# Patient Record
Sex: Female | Born: 1935
Health system: Southern US, Community
[De-identification: ages and names within clinical notes are randomized; demographics above are authoritative.]

## PROBLEM LIST (undated history)

## (undated) DIAGNOSIS — F4321 Adjustment disorder with depressed mood: Secondary | ICD-10-CM

## (undated) DIAGNOSIS — E041 Nontoxic single thyroid nodule: Secondary | ICD-10-CM

## (undated) DIAGNOSIS — J302 Other seasonal allergic rhinitis: Secondary | ICD-10-CM

## (undated) DIAGNOSIS — L299 Pruritus, unspecified: Secondary | ICD-10-CM

## (undated) DIAGNOSIS — M25551 Pain in right hip: Secondary | ICD-10-CM

## (undated) DIAGNOSIS — M1990 Unspecified osteoarthritis, unspecified site: Secondary | ICD-10-CM

## (undated) DIAGNOSIS — K219 Gastro-esophageal reflux disease without esophagitis: Secondary | ICD-10-CM

## (undated) DIAGNOSIS — E785 Hyperlipidemia, unspecified: Secondary | ICD-10-CM

## (undated) DIAGNOSIS — M1120 Other chondrocalcinosis, unspecified site: Secondary | ICD-10-CM

## (undated) DIAGNOSIS — F329 Major depressive disorder, single episode, unspecified: Secondary | ICD-10-CM

## (undated) DIAGNOSIS — I10 Essential (primary) hypertension: Secondary | ICD-10-CM

## (undated) DIAGNOSIS — F32A Depression, unspecified: Secondary | ICD-10-CM

## (undated) DIAGNOSIS — R7302 Impaired glucose tolerance (oral): Secondary | ICD-10-CM

## (undated) DIAGNOSIS — T7840XA Allergy, unspecified, initial encounter: Secondary | ICD-10-CM

## (undated) HISTORY — DX: Gastro-esophageal reflux disease without esophagitis: K21.9

## (undated) HISTORY — PX: ABDOMINAL HYSTERECTOMY: SHX81

## (undated) HISTORY — PX: KNEE SURGERY: SHX244

## (undated) HISTORY — PX: COLONOSCOPY: SHX174

## (undated) HISTORY — DX: Unspecified osteoarthritis, unspecified site: M19.90

## (undated) HISTORY — DX: Major depressive disorder, single episode, unspecified: F32.9

## (undated) HISTORY — DX: Other chondrocalcinosis, unspecified site: M11.20

## (undated) HISTORY — DX: Essential (primary) hypertension: I10

## (undated) HISTORY — DX: Impaired glucose tolerance (oral): R73.02

## (undated) HISTORY — DX: Allergy, unspecified, initial encounter: T78.40XA

## (undated) HISTORY — DX: Morbid (severe) obesity due to excess calories: E66.01

## (undated) HISTORY — DX: Other seasonal allergic rhinitis: J30.2

## (undated) HISTORY — DX: Nontoxic single thyroid nodule: E04.1

## (undated) HISTORY — DX: Hyperlipidemia, unspecified: E78.5

## (undated) HISTORY — DX: Depression, unspecified: F32.A

## (undated) HISTORY — DX: Pruritus, unspecified: L29.9

## (undated) HISTORY — PX: TUMOR REMOVAL: SHX12

---
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
## 1999-12-25 ENCOUNTER — Encounter: Payer: Self-pay | Admitting: Emergency Medicine

## 1999-12-25 ENCOUNTER — Emergency Department (HOSPITAL_COMMUNITY): Admission: EM | Admit: 1999-12-25 | Discharge: 1999-12-25 | Payer: Self-pay | Admitting: Emergency Medicine

## 2001-04-01 ENCOUNTER — Emergency Department (HOSPITAL_COMMUNITY): Admission: EM | Admit: 2001-04-01 | Discharge: 2001-04-01 | Payer: Self-pay | Admitting: *Deleted

## 2002-04-19 ENCOUNTER — Encounter: Admission: RE | Admit: 2002-04-19 | Discharge: 2002-04-19 | Payer: Self-pay | Admitting: Internal Medicine

## 2002-04-30 ENCOUNTER — Encounter: Admission: RE | Admit: 2002-04-30 | Discharge: 2002-07-29 | Payer: Self-pay

## 2002-09-10 ENCOUNTER — Encounter: Admission: RE | Admit: 2002-09-10 | Discharge: 2002-09-10 | Payer: Self-pay | Admitting: Internal Medicine

## 2002-09-19 ENCOUNTER — Encounter: Admission: RE | Admit: 2002-09-19 | Discharge: 2002-09-19 | Payer: Self-pay | Admitting: Internal Medicine

## 2003-03-04 ENCOUNTER — Encounter: Admission: RE | Admit: 2003-03-04 | Discharge: 2003-03-04 | Payer: Self-pay | Admitting: Internal Medicine

## 2003-04-09 ENCOUNTER — Encounter: Admission: RE | Admit: 2003-04-09 | Discharge: 2003-04-09 | Payer: Self-pay | Admitting: Internal Medicine

## 2003-05-29 ENCOUNTER — Encounter: Admission: RE | Admit: 2003-05-29 | Discharge: 2003-05-29 | Payer: Self-pay | Admitting: Internal Medicine

## 2003-05-31 ENCOUNTER — Encounter: Admission: RE | Admit: 2003-05-31 | Discharge: 2003-05-31 | Payer: Self-pay | Admitting: Internal Medicine

## 2003-06-13 ENCOUNTER — Emergency Department (HOSPITAL_COMMUNITY): Admission: EM | Admit: 2003-06-13 | Discharge: 2003-06-14 | Payer: Self-pay | Admitting: *Deleted

## 2003-06-13 ENCOUNTER — Encounter: Payer: Self-pay | Admitting: Emergency Medicine

## 2003-06-14 ENCOUNTER — Encounter: Admission: RE | Admit: 2003-06-14 | Discharge: 2003-06-14 | Payer: Self-pay | Admitting: Internal Medicine

## 2003-06-21 ENCOUNTER — Ambulatory Visit (HOSPITAL_COMMUNITY): Admission: RE | Admit: 2003-06-21 | Discharge: 2003-06-21 | Payer: Self-pay | Admitting: Internal Medicine

## 2003-06-21 ENCOUNTER — Encounter: Payer: Self-pay | Admitting: Internal Medicine

## 2003-06-26 ENCOUNTER — Encounter: Admission: RE | Admit: 2003-06-26 | Discharge: 2003-06-26 | Payer: Self-pay | Admitting: Internal Medicine

## 2003-07-02 ENCOUNTER — Encounter (HOSPITAL_COMMUNITY): Admission: RE | Admit: 2003-07-02 | Discharge: 2003-09-30 | Payer: Self-pay | Admitting: Internal Medicine

## 2003-07-03 ENCOUNTER — Encounter: Payer: Self-pay | Admitting: Internal Medicine

## 2003-07-05 ENCOUNTER — Encounter: Admission: RE | Admit: 2003-07-05 | Discharge: 2003-07-05 | Payer: Self-pay | Admitting: Internal Medicine

## 2003-07-15 ENCOUNTER — Ambulatory Visit (HOSPITAL_COMMUNITY): Admission: RE | Admit: 2003-07-15 | Discharge: 2003-07-15 | Payer: Self-pay | Admitting: Internal Medicine

## 2003-07-15 ENCOUNTER — Encounter (INDEPENDENT_AMBULATORY_CARE_PROVIDER_SITE_OTHER): Payer: Self-pay | Admitting: *Deleted

## 2003-07-15 ENCOUNTER — Encounter: Payer: Self-pay | Admitting: Internal Medicine

## 2003-08-21 ENCOUNTER — Observation Stay (HOSPITAL_COMMUNITY): Admission: RE | Admit: 2003-08-21 | Discharge: 2003-08-22 | Payer: Self-pay | Admitting: Otolaryngology

## 2003-08-21 ENCOUNTER — Encounter (INDEPENDENT_AMBULATORY_CARE_PROVIDER_SITE_OTHER): Payer: Self-pay | Admitting: Specialist

## 2003-11-05 ENCOUNTER — Encounter: Admission: RE | Admit: 2003-11-05 | Discharge: 2003-11-05 | Payer: Self-pay | Admitting: Internal Medicine

## 2003-11-19 ENCOUNTER — Encounter: Admission: RE | Admit: 2003-11-19 | Discharge: 2003-11-19 | Payer: Self-pay | Admitting: Internal Medicine

## 2003-12-19 ENCOUNTER — Encounter: Admission: RE | Admit: 2003-12-19 | Discharge: 2003-12-19 | Payer: Self-pay | Admitting: Internal Medicine

## 2004-03-13 ENCOUNTER — Encounter: Admission: RE | Admit: 2004-03-13 | Discharge: 2004-03-13 | Payer: Self-pay | Admitting: Internal Medicine

## 2004-03-19 ENCOUNTER — Encounter: Admission: RE | Admit: 2004-03-19 | Discharge: 2004-03-19 | Payer: Self-pay | Admitting: Internal Medicine

## 2004-03-19 ENCOUNTER — Encounter (INDEPENDENT_AMBULATORY_CARE_PROVIDER_SITE_OTHER): Payer: Self-pay | Admitting: *Deleted

## 2004-03-20 ENCOUNTER — Encounter: Admission: RE | Admit: 2004-03-20 | Discharge: 2004-03-20 | Payer: Self-pay | Admitting: Internal Medicine

## 2004-05-04 ENCOUNTER — Encounter: Admission: RE | Admit: 2004-05-04 | Discharge: 2004-05-04 | Payer: Self-pay | Admitting: Internal Medicine

## 2004-07-01 ENCOUNTER — Encounter (INDEPENDENT_AMBULATORY_CARE_PROVIDER_SITE_OTHER): Payer: Self-pay | Admitting: *Deleted

## 2004-07-01 ENCOUNTER — Ambulatory Visit: Payer: Self-pay | Admitting: Internal Medicine

## 2004-07-09 ENCOUNTER — Ambulatory Visit: Payer: Self-pay | Admitting: Internal Medicine

## 2004-09-26 ENCOUNTER — Emergency Department (HOSPITAL_COMMUNITY): Admission: EM | Admit: 2004-09-26 | Discharge: 2004-09-26 | Payer: Self-pay | Admitting: Emergency Medicine

## 2004-11-02 ENCOUNTER — Ambulatory Visit: Payer: Self-pay | Admitting: Internal Medicine

## 2004-11-24 ENCOUNTER — Ambulatory Visit: Payer: Self-pay | Admitting: Obstetrics and Gynecology

## 2004-11-24 ENCOUNTER — Ambulatory Visit (HOSPITAL_COMMUNITY): Admission: RE | Admit: 2004-11-24 | Discharge: 2004-11-24 | Payer: Self-pay | Admitting: Internal Medicine

## 2005-05-01 ENCOUNTER — Emergency Department (HOSPITAL_COMMUNITY): Admission: EM | Admit: 2005-05-01 | Discharge: 2005-05-01 | Payer: Self-pay | Admitting: Emergency Medicine

## 2005-05-05 ENCOUNTER — Emergency Department (HOSPITAL_COMMUNITY): Admission: EM | Admit: 2005-05-05 | Discharge: 2005-05-05 | Payer: Self-pay | Admitting: Emergency Medicine

## 2005-06-01 ENCOUNTER — Ambulatory Visit: Payer: Self-pay | Admitting: Internal Medicine

## 2005-06-08 ENCOUNTER — Ambulatory Visit: Payer: Self-pay | Admitting: Internal Medicine

## 2005-08-04 ENCOUNTER — Emergency Department (HOSPITAL_COMMUNITY): Admission: EM | Admit: 2005-08-04 | Discharge: 2005-08-04 | Payer: Self-pay | Admitting: Emergency Medicine

## 2005-12-23 ENCOUNTER — Encounter (INDEPENDENT_AMBULATORY_CARE_PROVIDER_SITE_OTHER): Payer: Self-pay | Admitting: *Deleted

## 2005-12-23 ENCOUNTER — Ambulatory Visit: Payer: Self-pay | Admitting: Internal Medicine

## 2005-12-23 LAB — CONVERTED CEMR LAB
Cholesterol: 158 mg/dL
HDL: 44 mg/dL
LDL Cholesterol: 98 mg/dL
Triglycerides: 82 mg/dL

## 2005-12-27 ENCOUNTER — Ambulatory Visit: Payer: Self-pay | Admitting: Internal Medicine

## 2006-02-03 ENCOUNTER — Emergency Department (HOSPITAL_COMMUNITY): Admission: EM | Admit: 2006-02-03 | Discharge: 2006-02-04 | Payer: Self-pay | Admitting: Emergency Medicine

## 2006-02-04 ENCOUNTER — Encounter (INDEPENDENT_AMBULATORY_CARE_PROVIDER_SITE_OTHER): Payer: Self-pay | Admitting: *Deleted

## 2006-02-04 ENCOUNTER — Ambulatory Visit (HOSPITAL_COMMUNITY): Admission: RE | Admit: 2006-02-04 | Discharge: 2006-02-04 | Payer: Self-pay | Admitting: Internal Medicine

## 2006-03-09 ENCOUNTER — Ambulatory Visit: Payer: Self-pay | Admitting: Internal Medicine

## 2006-03-17 ENCOUNTER — Ambulatory Visit: Payer: Self-pay | Admitting: Internal Medicine

## 2006-06-07 ENCOUNTER — Emergency Department (HOSPITAL_COMMUNITY): Admission: EM | Admit: 2006-06-07 | Discharge: 2006-06-07 | Payer: Self-pay | Admitting: Emergency Medicine

## 2006-07-29 ENCOUNTER — Ambulatory Visit: Payer: Self-pay | Admitting: Internal Medicine

## 2006-08-10 ENCOUNTER — Encounter (INDEPENDENT_AMBULATORY_CARE_PROVIDER_SITE_OTHER): Payer: Self-pay | Admitting: *Deleted

## 2006-08-10 DIAGNOSIS — E785 Hyperlipidemia, unspecified: Secondary | ICD-10-CM | POA: Insufficient documentation

## 2006-08-10 DIAGNOSIS — J309 Allergic rhinitis, unspecified: Secondary | ICD-10-CM | POA: Insufficient documentation

## 2006-08-10 DIAGNOSIS — IMO0002 Reserved for concepts with insufficient information to code with codable children: Secondary | ICD-10-CM | POA: Insufficient documentation

## 2006-08-10 DIAGNOSIS — E669 Obesity, unspecified: Secondary | ICD-10-CM | POA: Insufficient documentation

## 2006-08-10 DIAGNOSIS — L299 Pruritus, unspecified: Secondary | ICD-10-CM | POA: Insufficient documentation

## 2006-08-10 DIAGNOSIS — I10 Essential (primary) hypertension: Secondary | ICD-10-CM | POA: Insufficient documentation

## 2006-08-10 DIAGNOSIS — E041 Nontoxic single thyroid nodule: Secondary | ICD-10-CM

## 2006-08-10 HISTORY — DX: Nontoxic single thyroid nodule: E04.1

## 2006-08-10 HISTORY — DX: Allergic rhinitis, unspecified: J30.9

## 2006-11-01 DIAGNOSIS — F329 Major depressive disorder, single episode, unspecified: Secondary | ICD-10-CM

## 2006-11-01 DIAGNOSIS — F32A Depression, unspecified: Secondary | ICD-10-CM | POA: Insufficient documentation

## 2007-01-03 ENCOUNTER — Ambulatory Visit (HOSPITAL_COMMUNITY): Admission: RE | Admit: 2007-01-03 | Discharge: 2007-01-03 | Payer: Self-pay | Admitting: Internal Medicine

## 2007-01-03 ENCOUNTER — Encounter (INDEPENDENT_AMBULATORY_CARE_PROVIDER_SITE_OTHER): Payer: Self-pay | Admitting: *Deleted

## 2007-01-03 ENCOUNTER — Ambulatory Visit: Payer: Self-pay | Admitting: Hospitalist

## 2007-01-03 DIAGNOSIS — M810 Age-related osteoporosis without current pathological fracture: Secondary | ICD-10-CM | POA: Insufficient documentation

## 2007-01-03 DIAGNOSIS — E739 Lactose intolerance, unspecified: Secondary | ICD-10-CM | POA: Insufficient documentation

## 2007-01-03 DIAGNOSIS — R002 Palpitations: Secondary | ICD-10-CM | POA: Insufficient documentation

## 2007-01-03 LAB — CONVERTED CEMR LAB
BUN: 12 mg/dL (ref 6–23)
CO2: 26 meq/L (ref 19–32)
Calcium: 9.1 mg/dL (ref 8.4–10.5)
Chloride: 98 meq/L (ref 96–112)
Creatinine, Ser: 0.7 mg/dL (ref 0.40–1.20)
Glucose, Bld: 90 mg/dL (ref 70–99)
HCT: 38.3 % (ref 36.0–46.0)
Hemoglobin: 12.1 g/dL (ref 12.0–15.0)
MCHC: 31.6 g/dL (ref 30.0–36.0)
MCV: 81.8 fL (ref 78.0–100.0)
Platelets: 224 10*3/uL (ref 150–400)
Potassium: 4 meq/L (ref 3.5–5.3)
RBC: 4.68 M/uL (ref 3.87–5.11)
RDW: 15.5 % — ABNORMAL HIGH (ref 11.5–14.0)
Sodium: 139 meq/L (ref 135–145)
TSH: 0.941 microintl units/mL (ref 0.350–5.50)
WBC: 6.7 10*3/uL (ref 4.0–10.5)

## 2007-01-06 ENCOUNTER — Ambulatory Visit (HOSPITAL_COMMUNITY): Admission: RE | Admit: 2007-01-06 | Discharge: 2007-01-06 | Payer: Self-pay | Admitting: *Deleted

## 2007-01-06 ENCOUNTER — Encounter (INDEPENDENT_AMBULATORY_CARE_PROVIDER_SITE_OTHER): Payer: Self-pay | Admitting: *Deleted

## 2007-01-06 ENCOUNTER — Ambulatory Visit: Payer: Self-pay | Admitting: Internal Medicine

## 2007-01-09 LAB — CONVERTED CEMR LAB
Cholesterol: 186 mg/dL (ref 0–200)
Total CHOL/HDL Ratio: 4
VLDL: 17 mg/dL (ref 0–40)

## 2007-02-07 ENCOUNTER — Ambulatory Visit (HOSPITAL_COMMUNITY): Admission: RE | Admit: 2007-02-07 | Discharge: 2007-02-07 | Payer: Self-pay | Admitting: Internal Medicine

## 2007-04-17 ENCOUNTER — Telehealth (INDEPENDENT_AMBULATORY_CARE_PROVIDER_SITE_OTHER): Payer: Self-pay | Admitting: *Deleted

## 2007-05-07 ENCOUNTER — Emergency Department (HOSPITAL_COMMUNITY): Admission: EM | Admit: 2007-05-07 | Discharge: 2007-05-07 | Payer: Self-pay | Admitting: Emergency Medicine

## 2007-05-22 ENCOUNTER — Ambulatory Visit: Payer: Self-pay | Admitting: Infectious Disease

## 2007-05-22 DIAGNOSIS — D179 Benign lipomatous neoplasm, unspecified: Secondary | ICD-10-CM | POA: Insufficient documentation

## 2007-05-23 ENCOUNTER — Ambulatory Visit (HOSPITAL_COMMUNITY): Admission: RE | Admit: 2007-05-23 | Discharge: 2007-05-23 | Payer: Self-pay | Admitting: *Deleted

## 2007-05-24 ENCOUNTER — Encounter (INDEPENDENT_AMBULATORY_CARE_PROVIDER_SITE_OTHER): Payer: Self-pay | Admitting: *Deleted

## 2007-05-29 ENCOUNTER — Encounter: Payer: Self-pay | Admitting: Licensed Clinical Social Worker

## 2007-05-29 ENCOUNTER — Ambulatory Visit: Payer: Self-pay | Admitting: Internal Medicine

## 2007-05-29 DIAGNOSIS — M1711 Unilateral primary osteoarthritis, right knee: Secondary | ICD-10-CM | POA: Insufficient documentation

## 2007-05-29 DIAGNOSIS — M1990 Unspecified osteoarthritis, unspecified site: Secondary | ICD-10-CM | POA: Insufficient documentation

## 2007-06-02 ENCOUNTER — Encounter: Admission: RE | Admit: 2007-06-02 | Discharge: 2007-06-02 | Payer: Self-pay | Admitting: Orthopedic Surgery

## 2007-06-07 ENCOUNTER — Encounter: Admission: RE | Admit: 2007-06-07 | Discharge: 2007-06-21 | Payer: Self-pay | Admitting: Specialist

## 2007-06-07 ENCOUNTER — Encounter (INDEPENDENT_AMBULATORY_CARE_PROVIDER_SITE_OTHER): Payer: Self-pay | Admitting: *Deleted

## 2007-06-14 ENCOUNTER — Encounter (INDEPENDENT_AMBULATORY_CARE_PROVIDER_SITE_OTHER): Payer: Self-pay | Admitting: *Deleted

## 2007-06-15 ENCOUNTER — Telehealth (INDEPENDENT_AMBULATORY_CARE_PROVIDER_SITE_OTHER): Payer: Self-pay | Admitting: *Deleted

## 2007-06-26 ENCOUNTER — Emergency Department (HOSPITAL_COMMUNITY): Admission: EM | Admit: 2007-06-26 | Discharge: 2007-06-26 | Payer: Self-pay | Admitting: Emergency Medicine

## 2007-07-05 ENCOUNTER — Emergency Department (HOSPITAL_COMMUNITY): Admission: EM | Admit: 2007-07-05 | Discharge: 2007-07-05 | Payer: Self-pay | Admitting: Emergency Medicine

## 2007-07-10 ENCOUNTER — Encounter (INDEPENDENT_AMBULATORY_CARE_PROVIDER_SITE_OTHER): Payer: Self-pay | Admitting: *Deleted

## 2007-07-10 ENCOUNTER — Ambulatory Visit: Payer: Self-pay | Admitting: Internal Medicine

## 2007-07-10 LAB — CONVERTED CEMR LAB
Bilirubin Urine: NEGATIVE
Leukocytes, UA: NEGATIVE
Protein, ur: NEGATIVE mg/dL
Specific Gravity, Urine: 1.024 (ref 1.005–1.03)
Urobilinogen, UA: 0.2 (ref 0.0–1.0)

## 2007-07-19 ENCOUNTER — Telehealth: Payer: Self-pay | Admitting: *Deleted

## 2007-07-26 ENCOUNTER — Ambulatory Visit (HOSPITAL_BASED_OUTPATIENT_CLINIC_OR_DEPARTMENT_OTHER): Admission: RE | Admit: 2007-07-26 | Discharge: 2007-07-26 | Payer: Self-pay | Admitting: Orthopedic Surgery

## 2007-08-31 ENCOUNTER — Ambulatory Visit: Payer: Self-pay | Admitting: Internal Medicine

## 2008-01-01 ENCOUNTER — Telehealth (INDEPENDENT_AMBULATORY_CARE_PROVIDER_SITE_OTHER): Payer: Self-pay | Admitting: *Deleted

## 2008-02-15 ENCOUNTER — Ambulatory Visit (HOSPITAL_COMMUNITY): Admission: RE | Admit: 2008-02-15 | Discharge: 2008-02-15 | Payer: Self-pay | Admitting: *Deleted

## 2008-02-19 ENCOUNTER — Emergency Department (HOSPITAL_COMMUNITY): Admission: EM | Admit: 2008-02-19 | Discharge: 2008-02-19 | Payer: Self-pay | Admitting: Emergency Medicine

## 2008-02-23 ENCOUNTER — Ambulatory Visit: Payer: Self-pay | Admitting: Infectious Disease

## 2008-02-23 DIAGNOSIS — H9190 Unspecified hearing loss, unspecified ear: Secondary | ICD-10-CM | POA: Insufficient documentation

## 2008-03-27 ENCOUNTER — Emergency Department (HOSPITAL_COMMUNITY): Admission: EM | Admit: 2008-03-27 | Discharge: 2008-03-27 | Payer: Self-pay | Admitting: Emergency Medicine

## 2008-04-16 ENCOUNTER — Encounter (INDEPENDENT_AMBULATORY_CARE_PROVIDER_SITE_OTHER): Payer: Self-pay | Admitting: *Deleted

## 2008-04-16 ENCOUNTER — Ambulatory Visit (HOSPITAL_COMMUNITY): Admission: RE | Admit: 2008-04-16 | Discharge: 2008-04-16 | Payer: Self-pay | Admitting: *Deleted

## 2008-05-15 ENCOUNTER — Encounter (INDEPENDENT_AMBULATORY_CARE_PROVIDER_SITE_OTHER): Payer: Self-pay | Admitting: *Deleted

## 2008-05-15 ENCOUNTER — Ambulatory Visit: Payer: Self-pay | Admitting: Internal Medicine

## 2008-05-16 DIAGNOSIS — R7309 Other abnormal glucose: Secondary | ICD-10-CM | POA: Insufficient documentation

## 2008-05-16 LAB — CONVERTED CEMR LAB
Calcium: 9.1 mg/dL (ref 8.4–10.5)
Glucose, Bld: 113 mg/dL — ABNORMAL HIGH (ref 70–99)
Potassium: 3.5 meq/L (ref 3.5–5.3)
Sodium: 143 meq/L (ref 135–145)

## 2008-05-22 ENCOUNTER — Encounter (INDEPENDENT_AMBULATORY_CARE_PROVIDER_SITE_OTHER): Payer: Self-pay | Admitting: *Deleted

## 2008-05-30 ENCOUNTER — Encounter: Admission: RE | Admit: 2008-05-30 | Discharge: 2008-07-04 | Payer: Self-pay | Admitting: *Deleted

## 2008-06-07 ENCOUNTER — Encounter (INDEPENDENT_AMBULATORY_CARE_PROVIDER_SITE_OTHER): Payer: Self-pay | Admitting: *Deleted

## 2008-06-11 ENCOUNTER — Encounter (INDEPENDENT_AMBULATORY_CARE_PROVIDER_SITE_OTHER): Payer: Self-pay | Admitting: *Deleted

## 2008-06-12 ENCOUNTER — Ambulatory Visit: Payer: Self-pay | Admitting: Internal Medicine

## 2008-06-12 ENCOUNTER — Encounter (INDEPENDENT_AMBULATORY_CARE_PROVIDER_SITE_OTHER): Payer: Self-pay | Admitting: *Deleted

## 2008-06-12 LAB — CONVERTED CEMR LAB
Cholesterol: 168 mg/dL (ref 0–200)
HDL: 49 mg/dL (ref >39)
Total CHOL/HDL Ratio: 3.4
Triglycerides: 100 mg/dL (ref <150)
VLDL: 20 mg/dL (ref 0–40)

## 2008-07-11 ENCOUNTER — Telehealth: Payer: Self-pay | Admitting: Gastroenterology

## 2008-07-12 ENCOUNTER — Telehealth (INDEPENDENT_AMBULATORY_CARE_PROVIDER_SITE_OTHER): Payer: Self-pay | Admitting: *Deleted

## 2008-07-31 ENCOUNTER — Encounter (INDEPENDENT_AMBULATORY_CARE_PROVIDER_SITE_OTHER): Payer: Self-pay | Admitting: *Deleted

## 2008-12-12 ENCOUNTER — Ambulatory Visit (HOSPITAL_COMMUNITY): Admission: RE | Admit: 2008-12-12 | Discharge: 2008-12-12 | Payer: Self-pay | Admitting: *Deleted

## 2008-12-12 ENCOUNTER — Ambulatory Visit: Payer: Self-pay | Admitting: *Deleted

## 2008-12-13 ENCOUNTER — Telehealth: Payer: Self-pay | Admitting: *Deleted

## 2008-12-13 ENCOUNTER — Encounter (INDEPENDENT_AMBULATORY_CARE_PROVIDER_SITE_OTHER): Payer: Self-pay | Admitting: *Deleted

## 2008-12-16 ENCOUNTER — Telehealth: Payer: Self-pay | Admitting: *Deleted

## 2008-12-23 ENCOUNTER — Encounter (INDEPENDENT_AMBULATORY_CARE_PROVIDER_SITE_OTHER): Payer: Self-pay | Admitting: *Deleted

## 2009-01-06 ENCOUNTER — Telehealth (INDEPENDENT_AMBULATORY_CARE_PROVIDER_SITE_OTHER): Payer: Self-pay | Admitting: *Deleted

## 2009-01-15 ENCOUNTER — Telehealth (INDEPENDENT_AMBULATORY_CARE_PROVIDER_SITE_OTHER): Payer: Self-pay | Admitting: *Deleted

## 2009-02-28 ENCOUNTER — Telehealth (INDEPENDENT_AMBULATORY_CARE_PROVIDER_SITE_OTHER): Payer: Self-pay | Admitting: *Deleted

## 2009-04-22 ENCOUNTER — Emergency Department (HOSPITAL_COMMUNITY): Admission: EM | Admit: 2009-04-22 | Discharge: 2009-04-23 | Payer: Self-pay | Admitting: Emergency Medicine

## 2009-04-24 ENCOUNTER — Encounter (INDEPENDENT_AMBULATORY_CARE_PROVIDER_SITE_OTHER): Payer: Self-pay | Admitting: Internal Medicine

## 2009-04-24 ENCOUNTER — Ambulatory Visit: Payer: Self-pay | Admitting: Emergency Medicine

## 2009-04-24 DIAGNOSIS — K219 Gastro-esophageal reflux disease without esophagitis: Secondary | ICD-10-CM | POA: Insufficient documentation

## 2009-05-01 ENCOUNTER — Telehealth: Payer: Self-pay | Admitting: *Deleted

## 2009-05-09 LAB — CONVERTED CEMR LAB
Helicobacter Pylori Antibody-IgG: >8 — ABNORMAL HIGH
LDL Cholesterol: 102 mg/dL — ABNORMAL HIGH (ref 0–99)
VLDL: 19 mg/dL (ref 0–40)

## 2009-05-12 ENCOUNTER — Telehealth: Payer: Self-pay | Admitting: Internal Medicine

## 2009-06-05 ENCOUNTER — Emergency Department (HOSPITAL_COMMUNITY): Admission: EM | Admit: 2009-06-05 | Discharge: 2009-06-06 | Payer: Self-pay | Admitting: Emergency Medicine

## 2009-06-09 ENCOUNTER — Ambulatory Visit: Payer: Self-pay | Admitting: Internal Medicine

## 2009-06-09 DIAGNOSIS — IMO0002 Reserved for concepts with insufficient information to code with codable children: Secondary | ICD-10-CM | POA: Insufficient documentation

## 2009-06-09 DIAGNOSIS — R3915 Urgency of urination: Secondary | ICD-10-CM | POA: Insufficient documentation

## 2009-06-09 LAB — CONVERTED CEMR LAB: Sed Rate: 59 mm/hr — ABNORMAL HIGH (ref 0–22)

## 2009-06-24 ENCOUNTER — Ambulatory Visit: Payer: Self-pay | Admitting: Internal Medicine

## 2009-07-08 ENCOUNTER — Ambulatory Visit (HOSPITAL_COMMUNITY): Admission: RE | Admit: 2009-07-08 | Discharge: 2009-07-08 | Payer: Self-pay | Admitting: Internal Medicine

## 2009-07-09 LAB — HM MAMMOGRAPHY

## 2009-08-11 ENCOUNTER — Telehealth: Payer: Self-pay | Admitting: *Deleted

## 2009-10-08 ENCOUNTER — Emergency Department (HOSPITAL_COMMUNITY): Admission: EM | Admit: 2009-10-08 | Discharge: 2009-10-08 | Payer: Self-pay | Admitting: Emergency Medicine

## 2009-11-04 ENCOUNTER — Ambulatory Visit: Payer: Self-pay | Admitting: Internal Medicine

## 2009-11-04 ENCOUNTER — Encounter: Payer: Self-pay | Admitting: Internal Medicine

## 2009-11-04 DIAGNOSIS — M67919 Unspecified disorder of synovium and tendon, unspecified shoulder: Secondary | ICD-10-CM | POA: Insufficient documentation

## 2009-11-04 DIAGNOSIS — M719 Bursopathy, unspecified: Secondary | ICD-10-CM

## 2009-11-18 ENCOUNTER — Telehealth: Payer: Self-pay | Admitting: *Deleted

## 2009-11-19 ENCOUNTER — Ambulatory Visit: Payer: Self-pay | Admitting: Internal Medicine

## 2009-11-26 ENCOUNTER — Ambulatory Visit (HOSPITAL_COMMUNITY): Admission: RE | Admit: 2009-11-26 | Discharge: 2009-11-26 | Payer: Self-pay | Admitting: Internal Medicine

## 2009-12-03 ENCOUNTER — Ambulatory Visit: Payer: Self-pay | Admitting: Internal Medicine

## 2009-12-08 ENCOUNTER — Ambulatory Visit: Payer: Self-pay | Admitting: Internal Medicine

## 2009-12-08 LAB — CONVERTED CEMR LAB
HDL: 58 mg/dL (ref >39)
Hgb A1c MFr Bld: 5.8 %
Total CHOL/HDL Ratio: 3.2
VLDL: 18 mg/dL (ref 0–40)

## 2009-12-17 ENCOUNTER — Emergency Department (HOSPITAL_COMMUNITY): Admission: EM | Admit: 2009-12-17 | Discharge: 2009-12-17 | Payer: Self-pay | Admitting: Emergency Medicine

## 2010-03-26 ENCOUNTER — Emergency Department (HOSPITAL_COMMUNITY): Admission: EM | Admit: 2010-03-26 | Discharge: 2010-03-26 | Payer: Self-pay | Admitting: Emergency Medicine

## 2010-04-07 ENCOUNTER — Telehealth: Payer: Self-pay | Admitting: Internal Medicine

## 2010-04-07 ENCOUNTER — Encounter: Payer: Self-pay | Admitting: Internal Medicine

## 2010-04-08 ENCOUNTER — Ambulatory Visit: Payer: Self-pay | Admitting: Internal Medicine

## 2010-04-08 ENCOUNTER — Telehealth: Payer: Self-pay | Admitting: *Deleted

## 2010-04-08 LAB — CONVERTED CEMR LAB
AST: 19 units/L (ref 0–37)
Albumin: 3.9 g/dL (ref 3.5–5.2)
Alkaline Phosphatase: 92 units/L (ref 39–117)
Basophils Absolute: 0 10*3/uL (ref 0.0–0.1)
Basophils Relative: 0 % (ref 0–1)
Eosinophils Absolute: 0.1 10*3/uL (ref 0.0–0.7)
Glucose, Bld: 105 mg/dL — ABNORMAL HIGH (ref 70–99)
Hemoglobin, Urine: NEGATIVE
Hemoglobin: 13.2 g/dL (ref 12.0–15.0)
Ketones, ur: NEGATIVE mg/dL
MCHC: 33.2 g/dL (ref 30.0–36.0)
MCV: 85.1 fL (ref >=78.0)
Monocytes Absolute: 0.3 10*3/uL (ref 0.1–1.0)
Monocytes Relative: 5 % (ref 3–12)
Neutrophils Relative %: 65 % (ref 43–77)
Potassium: 4 meq/L (ref 3.5–5.3)
Protein, ur: NEGATIVE mg/dL
RBC: 4.68 M/uL (ref 3.87–5.11)
RDW: 14.3 % (ref 11.5–15.5)
Sodium: 139 meq/L (ref 135–145)
Total Bilirubin: 0.4 mg/dL (ref 0.3–1.2)
Total Protein: 8 g/dL (ref 6.0–8.3)
Urine Glucose: NEGATIVE mg/dL
pH: 6.5 (ref 5.0–8.0)

## 2010-05-04 ENCOUNTER — Ambulatory Visit: Payer: Self-pay | Admitting: Internal Medicine

## 2010-05-04 LAB — CONVERTED CEMR LAB
Bilirubin Urine: NEGATIVE
Blood in Urine, dipstick: NEGATIVE
Glucose, Urine, Semiquant: NEGATIVE
Ketones, urine, test strip: NEGATIVE
Nitrite: NEGATIVE
Protein, U semiquant: NEGATIVE
Specific Gravity, Urine: >=1.03
Urobilinogen, UA: 0.2
WBC Urine, dipstick: NEGATIVE
pH: 5

## 2010-05-18 ENCOUNTER — Ambulatory Visit: Payer: Self-pay | Admitting: Internal Medicine

## 2010-05-19 ENCOUNTER — Telehealth: Payer: Self-pay | Admitting: Internal Medicine

## 2010-06-01 ENCOUNTER — Ambulatory Visit: Payer: Self-pay | Admitting: Internal Medicine

## 2010-06-01 LAB — CONVERTED CEMR LAB
BUN: 18 mg/dL (ref 6–23)
CO2: 29 meq/L (ref 19–32)
Chloride: 100 meq/L (ref 96–112)
Glucose, Bld: 99 mg/dL (ref 70–99)
Potassium: 4.1 meq/L (ref 3.5–5.3)

## 2010-08-10 ENCOUNTER — Ambulatory Visit: Payer: Self-pay | Admitting: Internal Medicine

## 2010-08-10 LAB — CONVERTED CEMR LAB
Albumin: 4.4 g/dL (ref 3.5–5.2)
Alkaline Phosphatase: 95 units/L (ref 39–117)
BUN: 24 mg/dL — ABNORMAL HIGH (ref 6–23)
Creatinine, Ser: 1.34 mg/dL — ABNORMAL HIGH (ref 0.40–1.20)
Glucose, Bld: 111 mg/dL — ABNORMAL HIGH (ref 70–99)
Potassium: 3.9 meq/L (ref 3.5–5.3)

## 2010-09-15 ENCOUNTER — Ambulatory Visit (HOSPITAL_COMMUNITY)
Admission: RE | Admit: 2010-09-15 | Discharge: 2010-09-15 | Payer: Self-pay | Source: Home / Self Care | Admitting: Internal Medicine

## 2010-09-24 ENCOUNTER — Telehealth: Payer: Self-pay | Admitting: Internal Medicine

## 2010-09-25 ENCOUNTER — Telehealth: Payer: Self-pay | Admitting: Internal Medicine

## 2010-11-01 ENCOUNTER — Encounter: Payer: Self-pay | Admitting: Orthopedic Surgery

## 2010-11-10 NOTE — Assessment & Plan Note (Signed)
Summary: CHECKUP/SB.   Vital Signs:  Patient profile:   75 year old Christine [name] Height:      68 inches (172.72 cm) Weight:      282.8 pounds (128.55 kg) BMI:     43.16 Temp:     Christine.0 degrees F Pulse rate:   98 / minute BP sitting:   143 / 81  (right arm) Cuff size:   large  Vitals Entered By: Dorie Rank RN (November 04, 2009 3:15 PM) CC: follow up since fall on ice and snow about 2-3 weeks ago - needs med refills - cannot get generic Zyrtec on current med plan- needs refill for depression Is Patient Diabetic? No Pain Assessment Patient in pain? yes     Location: right shoulder and arm, right side Intensity: 8 Type: throbbing Onset of pain  2 - 3 weeks when fell in ice and snow - moving certain way "makes me cry out oh" Nutritional Status BMI of > 30 = obese  Does patient need assistance? Functional Status Self care Ambulation Normal   Primary Care Provider:  Darnelle Maffucci MD  CC:  follow up since fall on ice and snow about 2-3 weeks ago - needs med refills - cannot get generic Zyrtec on current med plan- needs refill for depression.  History of Present Illness: 75 yo f with pmh per EMR. here at [facility] c/o pain.   the pain started 2-3 weeks ago after fall on ice, denies head trauma, no LOC, no confusion after fall. pain is located at R shoulder, neck, is about 6/10 intesity, stabbing in quality, radiates to neck and down to arm,  it has been getting worse over the past month ,it is brought on by movement,  it usually lasts for  the time she takes pain meds, pain comes and goes, it is alleviated by flexiril and percocet(provided by [facility] ED), associated with nothing ,Denies SOB, Denies CP, Denies n,v,c,d, fever, chills, night sweats, last BM today.       Depression History:      The patient denies a depressed mood most of the day and a diminished interest in her usual daily activities.        Comments:  "some days I just sit in the corner and cry or want to tear my husband's head  off and other days I get along just fine - like mood swings".   Preventive Screening-Counseling & Management  Alcohol-Tobacco     Alcohol drinks/day: 0     Smoking Status: quit     Year Quit: 25 yrs +  Caffeine-Diet-Exercise     Does Patient Exercise: no     Type of exercise: ROM     Times/week:    3  Current Medications (verified): 1)  Amlodipine Besylate 5 Mg Tabs (Amlodipine Besylate) .... Take 1 Tablet By Mouth Once A Day 2)  Colace 100 Mg Caps (Docusate Sodium) .... Take 1 Capsule By Mouth Once A Day 3)  Lactaid  Tabs (Lactase Tabs) .... Take One Tab Before Taking Milk Products 4)  Celexa 40 Mg Tabs (Citalopram Hydrobromide) .... Take 1 Tablet By Mouth Once A Day 5)  Vicodin 5-500 Mg  Tabs (Hydrocodone-Acetaminophen) .... Take 1 Tablet By Mouth Up To Every 4 Hours As Needed For Pain 6)  Ra Loratadine 10 Mg Tabs (Loratadine) .... Take 1 Tablet By Mouth Once A Day 7)  Lipitor 40 Mg Tabs (Atorvastatin Calcium) .... Take 1 Tablet By Mouth At Bedtime For Your Cholesterol 8)  Omeprazole 40 Mg Cpdr (Omeprazole) .... Take 1 Capsule By Mouth Once A Day  Allergies (verified): 1)  ! Allegra-D 12 Hour  Review of Systems       as per hpi  Physical Exam  General:  alert, well-developed, and cooperative to examination.    Neck:  tender, reduced ROM, no thyromegaly, no JVD, and no carotid bruits.    Lungs:  normal respiratory effort, no accessory muscle use, normal breath sounds, no crackles, and no wheezes.  Heart:  normal rate, regular rhythm, no murmur, no gallop, and no rub.    Abdomen:  soft, non-tender, normal bowel sounds, no distention, no guarding, no rebound tenderness, no hepatomegaly, and no splenomegaly.    Msk:  R shouler pain, with limited ROM consistent with bursitis   Impression & Recommendations:  Problem # 1:  BURSITIS, RIGHT SHOULDER (ICD-726.10) limited ROM after fall, appears to be bursitis in nature.Patient refused steroid shot today to her shoulder, she would  like conservative approach. will make FU in one month. will also give script for VICODIN which she is on for chronic pain.  Problem # 2:  PRURITUS (ICD-698.9) on citrizine, would like an alternative due to cost, will change her to loratidine.   Problem # 3:  HYPERTENSION (ICD-401.9) [place], will continue to monitor, will continue to monitor, if remain elevated, will consider adjustment to her BP meds.  Her updated medication list for this problem includes:    Amlodipine Besylate 5 Mg Tabs (Amlodipine besylate) .[name]... Take 1 tablet by mouth once a day  Complete Medication List: 1)  Amlodipine Besylate 5 Mg Tabs (Amlodipine besylate) .... Take 1 tablet by mouth once a day 2)  Colace 100 Mg Caps (Docusate sodium) .... Take 1 capsule by mouth once a day 3)  Lactaid Tabs (Lactase tabs) .... Take one tab before taking milk products 4)  Celexa 40 Mg Tabs (Citalopram hydrobromide) .... Take 1 tablet by mouth once a day 5)  Vicodin 5-500 Mg Tabs (Hydrocodone-acetaminophen) .... Take 1 tablet by mouth up to every 4 hours as needed for pain 6)  Ra Loratadine 10 Mg Tabs (Loratadine) .... Take 1 tablet by mouth once a day 7)  Lipitor 40 Mg Tabs (Atorvastatin calcium) .... Take 1 tablet by mouth at bedtime for your cholesterol 8)  Omeprazole 40 Mg Cpdr (Omeprazole) .... Take 1 capsule by mouth once a day  Other Orders: Future Orders: T-Lipid Profile ([id number]) ... 12/02/2009 T-Hgb A1C (in-house) [phone]) ... 12/02/2009  Patient Instructions: 1)  fasting lipid profile and A1c on next visit. 2)  Please schedule a follow-up appointment in 1 month preferably with Dr Gilford Rile Prescriptions: VICODIN 5-500 MG  TABS (HYDROCODONE-ACETAMINOPHEN) take 1 tablet by mouth up to every 4 hours as needed for pain  #60 x 0   Entered and Authorized by:   Darnelle Maffucci MD   Signed by:   Darnelle Maffucci MD on 11/04/2009   Method used:   Print then Give to Patient   RxID:   [id number] RA LORATADINE 10 MG  TABS (LORATADINE) Take 1 tablet by mouth once a day  #30 x 6   Entered and Authorized by:   Darnelle Maffucci MD   Signed by:   Darnelle Maffucci MD on 11/04/2009   Method used:   Print then Give to Patient   RxID:   [id number]  Process Orders Check Orders Results:     [facility]: Check successful Tests Sent for requisitioning (November 04, 2009 5:07 PM):  12/02/2009: [facility] -- T-Lipid Profile [id number] (signed)    Prevention & Chronic Care Immunizations   Influenza vaccine: unable to take  (08/31/2007)   Influenza vaccine deferral: Refused  (06/24/2009)    Tetanus booster: Not documented   Td booster deferral: Refused  (06/24/2009)    Pneumococcal vaccine: Not documented   Pneumococcal vaccine deferral: Refused  (06/24/2009)    H. zoster vaccine: Not documented   H. zoster vaccine deferral: Refused  (06/24/2009)  Colorectal Screening   Hemoccult: negative  (03/19/2004)   Hemoccult action/deferral: Deferred  (06/24/2009)    Colonoscopy: Not documented   Colonoscopy action/deferral: Deferred  (06/24/2009)  Other Screening   Pap smear: Not documented   Pap smear action/deferral: Not indicated S/P hysterectomy  (04/24/2009)    Mammogram: ASSESSMENT: Negative - BI-RADS 1^MM DIGITAL SCREENING  (07/08/2009)   Mammogram action/deferral: Ordered  (06/24/2009)   Mammogram due: 02/15/2010    DXA bone density scan: Not documented   DXA bone density action/deferral: Deferred  (06/09/2009)   Smoking status: quit  (11/04/2009)  Lipids   Total Cholesterol: 174  (04/24/2009)   LDL: 102  (04/24/2009)   LDL Direct: Not documented   HDL: 53  (04/24/2009)   Triglycerides: Christine  (04/24/2009)    SGOT (AST): Not documented   SGPT (ALT): Not documented   Alkaline phosphatase: Not documented   Total bilirubin: Not documented  Hypertension   Last Blood Pressure: 143 / 81  (11/04/2009)   Serum creatinine: 0.72  (06/09/2009)   BMP action:  Ordered   Serum potassium 3.8  (06/09/2009)  Self-Management Support :   Personal Goals (by the next clinic visit) :      Personal blood pressure goal: 140/90  (06/09/2009)     Personal LDL goal: 100  (06/09/2009)    Patient will work on the following items until the next clinic visit to reach self-care goals:     Medications and monitoring: take my medicines every day, bring all of my medications to every visit  (11/04/2009)     Eating: drink diet soda or water instead of juice or soda  (11/04/2009)     Activity: take a 30 minute walk every day  (11/04/2009)     Other: has had increased chips and fried foods and added salt - states going to start "eating better" - states has not been able to exercise since sore from fall  (11/04/2009)    Hypertension self-management support: Written self-care plan  (06/09/2009)    Lipid self-management support: Not documented

## 2010-11-10 NOTE — Assessment & Plan Note (Signed)
Summary: 2WK F/U/TOBBIA/VS   Vital Signs:  Patient profile:   75 year old female Height:      68 inches (172.72 cm) Weight:      285.9 pounds (129.95 kg) BMI:     43.63 BSA:     2.38 Temp:     97.9 degrees F (36.61 degrees C) oral Pulse rate:   91 / minute BP sitting:   153 / 86  (right arm) Cuff size:   large  Vitals Entered By: Krystal Eaton Duncan Dull) (December 03, 2009 10:11 AM) CC: 2wk f/u-shoulder/arm pain  Is Patient Diabetic? No Pain Assessment Patient in pain? yes     Location: right shoulder/arm Intensity: 4 Type: sharp and ache Onset of pain  intermittent s/p fall in 09/2009 Nutritional Status BMI of > 30 = obese  Have you ever been in a relationship where you felt threatened, hurt or afraid?No   Does patient need assistance? Functional Status Self care Ambulation Normal   Primary Care Provider:  Darnelle Maffucci MD  CC:  2wk f/u-shoulder/arm pain .  History of Present Illness: 75 y/o woman with PMH of HTN, shoulder pain comes to the clinic for follow up visit. Her shoulder pain is controlled with vicodin and flexeril No new compliants today  Current Medications (verified): 1)  Amlodipine Besylate 10 Mg Tabs (Amlodipine Besylate) .... Take 1 Tab Daily 2)  Colace 100 Mg Caps (Docusate Sodium) .... Take 1 Capsule By Mouth Once A Day 3)  Lactaid  Tabs (Lactase Tabs) .... Take One Tab Before Taking Milk Products 4)  Celexa 40 Mg Tabs (Citalopram Hydrobromide) .... Take 1 Tablet By Mouth Once A Day 5)  Vicodin 5-500 Mg  Tabs (Hydrocodone-Acetaminophen) .... Take 1 Tablet By Mouth Up To Every 4 Hours As Needed For Pain 6)  Ra Loratadine 10 Mg Tabs (Loratadine) .... Take 1 Tablet By Mouth Once A Day 7)  Lipitor 40 Mg Tabs (Atorvastatin Calcium) .... Take 1 Tablet By Mouth At Bedtime For Your Cholesterol 8)  Omeprazole 40 Mg Cpdr (Omeprazole) .... Take 1 Capsule By Mouth Once A Day 9)  Hydrocodone-Acetaminophen 5-500 Mg Tabs (Hydrocodone-Acetaminophen) 10)   Cyclobenzaprine Hcl 10 Mg Tabs (Cyclobenzaprine Hcl) .... Three Times A Day  Allergies: 1)  ! Allegra-D 12 Hour  Review of Systems  The patient denies anorexia, fever, weight loss, weight gain, vision loss, decreased hearing, hoarseness, chest pain, syncope, dyspnea on exertion, peripheral edema, prolonged cough, headaches, hemoptysis, abdominal pain, melena, hematochezia, severe indigestion/heartburn, hematuria, incontinence, genital sores, muscle weakness, suspicious skin lesions, transient blindness, difficulty walking, depression, unusual weight change, abnormal bleeding, enlarged lymph nodes, angioedema, breast masses, and testicular masses.    Physical Exam  General:  alert, well-hydrated, and overweight-appearing.   Head:  normocephalic and atraumatic.   Eyes:  vision grossly intact, pupils equal, pupils round, pupils reactive to light, and pupils react to accomodation.   Ears:  R ear normal and L ear normal.   Nose:  no external deformity.   Mouth:   pharynx pink and moist, no erythema, and no exudates.    Neck:  supple, full ROM, and no JVD.   Lungs:  normal respiratory effort, no accessory muscle use, normal breath sounds, and no wheezes.   Heart:  normal rate, regular rhythm, no murmur, no gallop, and no rub.   Abdomen:  soft, non-tender, normal bowel sounds, no distention, and no masses.   Msk:  R shouler pain, better compared to last visit, with limited ROM Pulses:  dorsalis  pedis pulses normal bilaterally  Extremities:  no edema Neurologic:  OrientedX3, cranial nerver 2-12 intact,strength good in all extremities, sensations normal to light touch, reflexes 2+ b/l, gait normal    Impression & Recommendations:  Problem # 1:  BURSITIS, RIGHT SHOULDER (ICD-726.10) Continue with vocodin and flexeril. Shoulder xra shows degenrative changes, no acute bony abnormality  Problem # 2:  HYPERTENSION (ICD-401.9) Increase norvasc to 10mg  once daily for better control  Her updated  medication list for this problem includes:    Amlodipine Besylate 10 Mg Tabs (Amlodipine besylate) .[name]... Take 1 tab daily  Problem # 3:  HYPERLIPIDEMIA (ICD-272.4) Will need lipid profile. Will come back in a week for that. is not fasting today Her updated medication list for this problem includes:    Lipitor 40 Mg Tabs (Atorvastatin calcium) .[name]... Take 1 tablet by mouth at bedtime for your cholesterol  Future Orders: T-Lipid Profile ([id number]) ... 12/08/2009    HDL:53 (04/24/2009), 49 (06/12/2008)  LDL:102 (04/24/2009), 99 (14/78/2956)  Chol:174 (04/24/2009), 168 (06/12/2008)  Trig:97 (04/24/2009), 100 (06/12/2008)  Complete Medication List: 1)  Amlodipine Besylate 10 Mg Tabs (Amlodipine besylate) .... Take 1 tab daily 2)  Colace 100 Mg Caps (Docusate sodium) .... Take 1 capsule by mouth once a day 3)  Lactaid Tabs (Lactase tabs) .... Take one tab before taking milk products 4)  Celexa 40 Mg Tabs (Citalopram hydrobromide) .... Take 1 tablet by mouth once a day 5)  Vicodin 5-500 Mg Tabs (Hydrocodone-acetaminophen) .... Take 1 tablet by mouth up to every 4 hours as needed for pain 6)  Ra Loratadine 10 Mg Tabs (Loratadine) .... Take 1 tablet by mouth once a day 7)  Lipitor 40 Mg Tabs (Atorvastatin calcium) .... Take 1 tablet by mouth at bedtime for your cholesterol 8)  Omeprazole 40 Mg Cpdr (Omeprazole) .... Take 1 capsule by mouth once a day 9)  Hydrocodone-acetaminophen 5-500 Mg Tabs (Hydrocodone-acetaminophen) 10)  Cyclobenzaprine Hcl 10 Mg Tabs (Cyclobenzaprine hcl) .... Three times a day  Patient Instructions: 1)  Please schedule a follow-up appointment in 3 months. 2)  Come on Monday 12/08/2009 for cholesterol check. come fasting at that time 3)  It is important that you exercise regularly at least 20 minutes 5 times a week. If you develop chest pain, have severe difficulty breathing, or feel very tired , stop exercising immediately and seek medical attention. 4)  You need to lose  weight. Consider a lower calorie diet and regular exercise.  Prescriptions: AMLODIPINE BESYLATE 10 MG TABS (AMLODIPINE BESYLATE) Take 1 tab daily  #30 x 3   Entered and Authorized by:   Bethel Born MD   Signed by:   Bethel Born MD on 12/03/2009   Method used:   Electronically to        [facility] [id number] (retail)       [address]       [place], [place]  [address]       Ph: [phone]       Fax: [phone]   RxID:   [id number]  Process Orders Check Orders Results:     [facility]: Check successful Tests Sent for requisitioning (December 03, 2009 12:08 PM):     12/08/2009: [facility] -- T-Lipid Profile [id number] (signed)    Prevention & Chronic Care Immunizations   Influenza vaccine: unable to take  (08/31/2007)   Influenza vaccine deferral: Refused  (06/24/2009)    Tetanus  booster: Not documented   Td booster deferral: Refused  (06/24/2009)    Pneumococcal vaccine: Not documented   Pneumococcal vaccine deferral: Refused  (06/24/2009)    H. zoster vaccine: Not documented   H. zoster vaccine deferral: Refused  (06/24/2009)  Colorectal Screening   Hemoccult: negative  (03/19/2004)   Hemoccult action/deferral: Deferred  (06/24/2009)    Colonoscopy: Not documented   Colonoscopy action/deferral: Deferred  (06/24/2009)  Other Screening   Pap smear: Not documented   Pap smear action/deferral: Not indicated S/P hysterectomy  (04/24/2009)    Mammogram: ASSESSMENT: Negative - BI-RADS 1^MM DIGITAL SCREENING  (07/08/2009)   Mammogram action/deferral: Ordered  (06/24/2009)   Mammogram due: 02/15/2010    DXA bone density scan: Not documented   DXA bone density action/deferral: Deferred  (06/09/2009)   Smoking status: quit  (11/19/2009)  Lipids   Total Cholesterol: 174  (04/24/2009)   LDL: 102  (04/24/2009)   LDL Direct: Not documented   HDL: 53  (04/24/2009)   Triglycerides: 97   (04/24/2009)    SGOT (AST): Not documented   SGPT (ALT): Not documented   Alkaline phosphatase: Not documented   Total bilirubin: Not documented  Hypertension   Last Blood Pressure: 153 / 86  (12/03/2009)   Serum creatinine: 0.72  (06/09/2009)   BMP action: Ordered   Serum potassium 3.8  (06/09/2009)  Self-Management Support :   Personal Goals (by the next clinic visit) :      Personal blood pressure goal: 140/90  (06/09/2009)     Personal LDL goal: 100  (06/09/2009)    Patient will work on the following items until the next clinic visit to reach self-care goals:     Medications and monitoring: take my medicines every day  (12/03/2009)     Eating: drink diet soda or water instead of juice or soda, eat foods that are low in salt, eat baked foods instead of fried foods  (12/03/2009)     Activity: join a walking program  (11/19/2009)     Other: has had increased chips and fried foods and added salt - states going to start "eating better" - states has not been able to exercise since sore from fall  (11/04/2009)    Hypertension self-management support: Written self-care plan, Resources for patients handout  (12/03/2009)   Hypertension self-care plan printed.    Lipid self-management support: Written self-care plan, Resources for patients handout  (12/03/2009)   Lipid self-care plan printed.      Resource handout printed.

## 2010-11-10 NOTE — Assessment & Plan Note (Signed)
Summary: EST-2 WEEK RECHECK/CH   Vital Signs:  Patient profile:   75 year old Christine [name] Height:      68 inches (172.72 cm) Weight:      300.7 pounds (136.68 kg) BMI:     45.89 Temp:     97.0 degrees F (36.11 degrees C) oral Pulse rate:   92 / minute BP sitting:   137 / 84  (right arm)  Vitals Entered By: Stanton Kidney Ditzler RN (June 01, 2010 9:07 AM) CC: Depression Is Patient Diabetic? No Pain Assessment Patient in pain? yes     Location: right knee Intensity: 4 Type: throbbing Onset of pain  years Nutritional Status BMI of > 30 = obese Nutritional Status Detail appetite good  Have you ever been in a relationship where you felt threatened, hurt or afraid?denies   Does patient need assistance? Functional Status Self care Ambulation Normal Comments FU BP.   Primary Care Provider:  Darnelle Maffucci MD  CC:  Depression.  History of Present Illness: 75 y/o woman with PMH of HTN, shoulder pain comes to the clinic for a 2 week follow up visit for HTN.  Pt has been tolerating BP meds well, her BP today in under good control.  Pt reports improved urinary urgency after out discussion last week, and she is performing pelvic muscle strengthening exercises.   Patient is feeling well and denies CP, abdominal pain, nausea, vomiting, HA's, palpitations, blurred vision. fever, chills, diarrhea, constipation or SOB.   Depression History:      The patient denies a depressed mood most of the day and a diminished interest in her usual daily activities.         Preventive Screening-Counseling & Management  Alcohol-Tobacco     Alcohol drinks/day: 0     Smoking Status: quit     Year Quit: 25 yrs +  Caffeine-Diet-Exercise     Does Patient Exercise: no     Type of exercise: ROM     Times/week:    3  Current Medications (verified): 1)  Triamterene-Hctz 37.5-25 Mg Tabs (Triamterene-Hctz) .... Take 1 Tablet By Mouth Once A Day 2)  Colace 100 Mg Caps (Docusate Sodium) .... Take 1 Capsule By  Mouth Once A Day 3)  Lactaid  Tabs (Lactase Tabs) .... Take One Tab Before Taking Milk Products 4)  Celexa 40 Mg Tabs (Citalopram Hydrobromide) .... Take 1 Tablet By Mouth Once A Day 5)  Vicodin 5-500 Mg  Tabs (Hydrocodone-Acetaminophen) .... Take 1 Tablet By Mouth Up To Every 4 Hours As Needed For Pain 6)  Ra Loratadine 10 Mg Tabs (Loratadine) .... Take 1 Tablet By Mouth Once A Day 7)  Lipitor 40 Mg Tabs (Atorvastatin Calcium) .... Take 1 Tablet By Mouth At Bedtime For Your Cholesterol 8)  Omeprazole 40 Mg Cpdr (Omeprazole) .... Take 1 Capsule By Mouth Once A Day 9)  Hydrocodone-Acetaminophen 5-500 Mg Tabs (Hydrocodone-Acetaminophen) 10)  Cyclobenzaprine Hcl 10 Mg Tabs (Cyclobenzaprine Hcl) .... Three Times A Day 11)  Lisinopril 10 Mg Tabs (Lisinopril) .... Take 1 Tablet By Mouth Once A Day  Allergies: 1)  ! Allegra-D 12 Hour  Review of Systems       as per HPI  Physical Exam  General:  alert, well-hydrated, and overweight-appearing.   Mouth:   pharynx pink and moist, no erythema, and no exudates.    Neck:  supple, full ROM, and no JVD.   Lungs:  normal respiratory effort, no accessory muscle use, normal breath sounds, and no wheezes.  Heart:  normal rate, regular rhythm, no murmur, no gallop, and no rub.   Abdomen:  soft, non-tender, normal bowel sounds, no distention, and no masses.   Msk:  R shouler pain, better compared to last visit, with limited ROM Extremities:  no edema Neurologic:  OrientedX3, cranial nerver 2-12 intact,strength good in all extremities, sensations normal to light touch, reflexes 2+ b/l, gait normal  Skin:   turgor normal and no rashes.    Impression & Recommendations:  Problem # 1:  HYPERTENSION (ICD-401.9) Assessment Improved BP well controlled today, will to monitor and check a BMET given recent lisinopril initiation.   Her updated medication list for this problem includes:    Triamterene-hctz 37.5-25 Mg Tabs (Triamterene-hctz) .[name]... Take 1 tablet  by mouth once a day    Lisinopril 10 Mg Tabs (Lisinopril) .[name]... Take 1 tablet by mouth once a day  Orders: T-Basic Metabolic Panel [phone])  Problem # 2:  URINARY URGENCY (QMV-784.69) Assessment: Improved symptoms improved, will reassess on next followup and continue to Recommended pelvic floor strengthening exercises.  Problem # 3:  HYPERLIPIDEMIA (ICD-272.4) Assessment: Comment Only Well controlled on current treatment, No new changes made today, Will continue to monitor.   Her updated medication list for this problem includes:    Lipitor 40 Mg Tabs (Atorvastatin calcium) .[name]... Take 1 tablet by mouth at bedtime for your cholesterol  Labs Reviewed: SGOT: 19 (04/08/2010)   SGPT: 18 (04/08/2010)   HDL:58 (12/08/2009), 53 (04/24/2009)  LDL:110 (12/08/2009), 102 (04/24/2009)  Chol:186 (12/08/2009), 174 (04/24/2009)  Trig:91 (12/08/2009), 97 (04/24/2009)  Complete Medication List: 1)  Triamterene-hctz 37.5-25 Mg Tabs (Triamterene-hctz) .... Take 1 tablet by mouth once a day 2)  Colace 100 Mg Caps (Docusate sodium) .... Take 1 capsule by mouth once a day 3)  Lactaid Tabs (Lactase tabs) .... Take one tab before taking milk products 4)  Celexa 40 Mg Tabs (Citalopram hydrobromide) .... Take 1 tablet by mouth once a day 5)  Vicodin 5-500 Mg Tabs (Hydrocodone-acetaminophen) .... Take 1 tablet by mouth up to every 4 hours as needed for pain 6)  Ra Loratadine 10 Mg Tabs (Loratadine) .... Take 1 tablet by mouth once a day 7)  Lipitor 40 Mg Tabs (Atorvastatin calcium) .... Take 1 tablet by mouth at bedtime for your cholesterol 8)  Omeprazole 40 Mg Cpdr (Omeprazole) .... Take 1 capsule by mouth once a day 9)  Hydrocodone-acetaminophen 5-500 Mg Tabs (Hydrocodone-acetaminophen) 10)  Cyclobenzaprine Hcl 10 Mg Tabs (Cyclobenzaprine hcl) .... Three times a day 11)  Lisinopril 10 Mg Tabs (Lisinopril) .... Take 1 tablet by mouth once a day  Patient Instructions: 1)  Please schedule a follow-up  appointment in 3 months. Prescriptions: LISINOPRIL 10 MG TABS (LISINOPRIL) Take 1 tablet by mouth once a day  #30 x 3   Entered and Authorized by:   Darnelle Maffucci MD   Signed by:   Darnelle Maffucci MD on 06/01/2010   Method used:   Electronically to        [facility] [id number] (retail)       [address] [place], [place]  [address]       Ph: [phone]       Fax: [phone]   RxID:   [id number] TRIAMTERENE-HCTZ 37.5-25 MG TABS (TRIAMTERENE-HCTZ) Take 1 tablet by mouth once a day  #30 x 3   Entered and Authorized by:   Darnelle Maffucci  MD   Signed by:   Darnelle Maffucci MD on 06/01/2010   Method used:   Electronically to        [facility] [id number] (retail)       [address] [place], [place]  [address]       Ph: [phone]       Fax: [phone]   RxID:   [id number]  Process Orders Check Orders Results:     [facility]: Check successful Tests Sent for requisitioning (June 01, 2010 12:14 PM):     06/01/2010: [facility] -- T-Basic Metabolic Panel [id number] (signed)     Prevention & Chronic Care Immunizations   Influenza vaccine: unable to take  (08/31/2007)   Influenza vaccine deferral: Refused  (06/24/2009)    Tetanus booster: Not documented   Td booster deferral: Refused  (06/24/2009)    Pneumococcal vaccine: Not documented   Pneumococcal vaccine deferral: Refused  (06/24/2009)    H. zoster vaccine: Not documented   H. zoster vaccine deferral: Refused  (06/24/2009)  Colorectal Screening   Hemoccult: negative  (03/19/2004)   Hemoccult action/deferral: Deferred  (06/24/2009)    Colonoscopy: Not documented   Colonoscopy action/deferral: Deferred  (06/24/2009)  Other Screening   Pap smear: Not documented   Pap smear action/deferral: Not indicated S/P hysterectomy  (04/24/2009)    Mammogram: ASSESSMENT: Negative - BI-RADS 1^MM DIGITAL SCREENING   (07/08/2009)   Mammogram action/deferral: Ordered  (06/24/2009)   Mammogram due: 02/15/2010    DXA bone density scan: Not documented   DXA bone density action/deferral: Deferred  (06/09/2009)   Smoking status: quit  (06/01/2010)  Lipids   Total Cholesterol: 186  (12/08/2009)   LDL: 110  (12/08/2009)   LDL Direct: Not documented   HDL: 58  (12/08/2009)   Triglycerides: 91  (12/08/2009)    SGOT (AST): 19  (04/08/2010)   SGPT (ALT): 18  (04/08/2010)   Alkaline phosphatase: 92  (04/08/2010)   Total bilirubin: 0.4  (04/08/2010)  Hypertension   Last Blood Pressure: 137 / 84  (06/01/2010)   Serum creatinine: 0.65  (04/08/2010)   BMP action: Ordered   Serum potassium 4.0  (04/08/2010)  Self-Management Support :   Personal Goals (by the next clinic visit) :      Personal blood pressure goal: 140/90  (06/09/2009)     Personal LDL goal: 100  (06/09/2009)    Patient will work on the following items until the next clinic visit to reach self-care goals:     Medications and monitoring: take my medicines every day, bring all of my medications to every visit  (05/18/2010)     Eating: drink diet soda or water instead of juice or soda, eat more vegetables, use fresh or frozen vegetables, eat foods that are low in salt, eat fruit for snacks and desserts, limit or avoid alcohol  (05/18/2010)     Activity: take a 30 minute walk every day  (05/18/2010)     Other: no change since last viist  (06/01/2010)    Hypertension self-management support: Written self-care plan, Education handout, Resources for patients handout  (05/18/2010)    Lipid self-management support: Written self-care plan, Education handout, Resources for patients handout  (05/18/2010)

## 2010-11-10 NOTE — Progress Notes (Signed)
Summary: f/u ED, no better/ hla  Phone Note Call from Patient   Caller: Patient Summary of Call: pt calls c/o pain related to either gallbladder or kidneys...can't remember what she was told recently at [facility] ED desires to be seen or pain med called in, pt is instructed she needs to be seen...appts not available until end of next week...asked to go to urgent care at [facility] for eval and update, she is agreeable Initial call taken by: Marin Roberts RN,  April 07, 2010 12:33 PM  Follow-up for Phone Call        Agree. Follow-up by: Zoila Shutter MD,  April 07, 2010 2:25 PM

## 2010-11-10 NOTE — Assessment & Plan Note (Signed)
Summary: EST-2 WEEK RECHECK/CH   Vital Signs:  Patient profile:   75 year old female Height:      68 inches (172.72 cm) Weight:      299.1 pounds (135.95 kg) BMI:     45.64 Temp:     97.6 degrees F (36.44 degrees C) oral Pulse rate:   92 / minute BP sitting:   147 / 74  (right arm)  Vitals Entered By: Stanton Kidney Ditzler RN (May 18, 2010 10:13 AM) Is Patient Diabetic? No Pain Assessment Patient in pain? no      Nutritional Status BMI of > 30 = obese Nutritional Status Detail appetite good  Have you ever been in a relationship where you felt threatened, hurt or afraid?denies   Does patient need assistance? Functional Status Self care Ambulation Normal Comments FU  - no change. Cont to have pressure with urination and occ pain with urination - no change. Up 4-5 times at night. Dr Gilford Rile wanted a right knee sleeve - ortho tech fitted pt.   Primary Care Provider:  Darnelle Maffucci MD   History of Present Illness: 75 y/o woman with PMH of HTN, shoulder pain comes to the clinic for follow up visit.  c/o of dysuria, and urinary urgency as she is running to the toilet she has an urge to urinate and sometimes is unable to hold her urine before she reached her WC, first noticed it 3 weeks ago, UA was checked on last visit and was WNL, dysuria is only when she has an urge to urinate.  2-3x morning,4-5x at night and is interfering with daily activities.   c/o right knee pain, however this is chronic, and stable on current regiment, would like refil on vicodin and knee support hose.   Pt denies fever, chills, or any other complaints, and otherwise doing well.   Depression History:      The patient denies a depressed mood most of the day and a diminished interest in her usual daily activities.         Preventive Screening-Counseling & Management  Alcohol-Tobacco     Alcohol drinks/day: 0     Smoking Status: quit     Year Quit: 25 yrs +  Caffeine-Diet-Exercise     Does Patient  Exercise: no     Type of exercise: ROM     Times/week:    3  Current Medications (verified): 1)  Triamterene-Hctz 37.5-25 Mg Tabs (Triamterene-Hctz) .... Take 1 Tablet By Mouth Once A Day 2)  Colace 100 Mg Caps (Docusate Sodium) .... Take 1 Capsule By Mouth Once A Day 3)  Lactaid  Tabs (Lactase Tabs) .... Take One Tab Before Taking Milk Products 4)  Celexa 40 Mg Tabs (Citalopram Hydrobromide) .... Take 1 Tablet By Mouth Once A Day 5)  Vicodin 5-500 Mg  Tabs (Hydrocodone-Acetaminophen) .... Take 1 Tablet By Mouth Up To Every 4 Hours As Needed For Pain 6)  Ra Loratadine 10 Mg Tabs (Loratadine) .... Take 1 Tablet By Mouth Once A Day 7)  Lipitor 40 Mg Tabs (Atorvastatin Calcium) .... Take 1 Tablet By Mouth At Bedtime For Your Cholesterol 8)  Omeprazole 40 Mg Cpdr (Omeprazole) .... Take 1 Capsule By Mouth Once A Day 9)  Hydrocodone-Acetaminophen 5-500 Mg Tabs (Hydrocodone-Acetaminophen) 10)  Cyclobenzaprine Hcl 10 Mg Tabs (Cyclobenzaprine Hcl) .... Three Times A Day 11)  Lisinopril 10 Mg Tabs (Lisinopril) .... Take 1 Tablet By Mouth Once A Day  Allergies: 1)  ! Allegra-D 12 Hour  Review  of Systems       As Per HPI  Physical Exam  General:  alert, well-hydrated, and overweight-appearing.   Mouth:   pharynx pink and moist, no erythema, and no exudates.    Neck:  supple, full ROM, and no JVD.   Lungs:  normal respiratory effort, no accessory muscle use, normal breath sounds, and no wheezes.   Heart:  normal rate, regular rhythm, no murmur, no gallop, and no rub.   Abdomen:  soft, non-tender, normal bowel sounds, no distention, and no masses.   Msk:  R shouler pain, better compared to last visit, with limited ROM Pulses:  dorsalis pedis pulses normal bilaterally  Extremities:  no edema Neurologic:  OrientedX3, cranial nerver 2-12 intact,strength good in all extremities, sensations normal to light touch, reflexes 2+ b/l, gait normal  Skin:   turgor normal and no rashes.  Psych:  Oriented  X3, memory intact for recent and remote, normally interactive, good eye contact, not anxious appearing, and not depressed appearing.    Impression & Recommendations:  Problem # 1:  URINARY URGENCY (ICD-788.63) UA was negative, i have instructed the patient on kegals, and given her printed patients instructions.  if this does not help will consider medical management for urge incontincence, patient does not have dryness or itching making diagnosis of postmenopausal urgancy less likely.   Problem # 2:  HYPERTENSION (ICD-401.9) not well controlled, will add lisinopril and bring back in one week for f/u bmet.   Her updated medication list for this problem includes:    Triamterene-hctz 37.5-25 Mg Tabs (Triamterene-hctz) .[name]... Take 1 tablet by mouth once a day    Lisinopril 10 Mg Tabs (Lisinopril) .[name]... Take 1 tablet by mouth once a day  Future Orders: T-Basic Metabolic Panel [id number]) ... 06/01/2010  Problem # 3:  HYPERLIPIDEMIA (ICD-272.4) Well controlled on current treatment, No new changes made today, Will continue to monitor.  will check FLP and LFT on next visit. Her updated medication list for this problem includes:    Lipitor 40 Mg Tabs (Atorvastatin calcium) .[name]... Take 1 tablet by mouth at bedtime for your cholesterol  Labs Reviewed: SGOT: 19 (04/08/2010)   SGPT: 18 (04/08/2010)   HDL:58 (12/08/2009), 53 (04/24/2009)  LDL:110 (12/08/2009), 102 (04/24/2009)  Chol:186 (12/08/2009), 174 (04/24/2009)  Trig:91 (12/08/2009), 97 (04/24/2009)  Complete Medication List: 1)  Triamterene-hctz 37.5-25 Mg Tabs (Triamterene-hctz) .... Take 1 tablet by mouth once a day 2)  Colace 100 Mg Caps (Docusate sodium) .... Take 1 capsule by mouth once a day 3)  Lactaid Tabs (Lactase tabs) .... Take one tab before taking milk products 4)  Celexa 40 Mg Tabs (Citalopram hydrobromide) .... Take 1 tablet by mouth once a day 5)  Vicodin 5-500 Mg Tabs (Hydrocodone-acetaminophen) .... Take 1 tablet by mouth  up to every 4 hours as needed for pain 6)  Ra Loratadine 10 Mg Tabs (Loratadine) .... Take 1 tablet by mouth once a day 7)  Lipitor 40 Mg Tabs (Atorvastatin calcium) .... Take 1 tablet by mouth at bedtime for your cholesterol 8)  Omeprazole 40 Mg Cpdr (Omeprazole) .... Take 1 capsule by mouth once a day 9)  Hydrocodone-acetaminophen 5-500 Mg Tabs (Hydrocodone-acetaminophen) 10)  Cyclobenzaprine Hcl 10 Mg Tabs (Cyclobenzaprine hcl) .... Three times a day 11)  Lisinopril 10 Mg Tabs (Lisinopril) .... Take 1 tablet by mouth once a day  Patient Instructions: 1)  Please schedule a follow-up appointment in 2 weeks. Prescriptions: LISINOPRIL 10 MG TABS (LISINOPRIL) Take 1 tablet by mouth  once a day  #30 x 0   Entered and Authorized by:   Darnelle Maffucci MD   Signed by:   Darnelle Maffucci MD on 05/18/2010   Method used:   Print then Give to Patient   RxID:   [id number] VICODIN 5-500 MG  TABS (HYDROCODONE-ACETAMINOPHEN) take 1 tablet by mouth up to every 4 hours as needed for pain  #60 x 0   Entered and Authorized by:   Darnelle Maffucci MD   Signed by:   Darnelle Maffucci MD on 05/18/2010   Method used:   Print then Give to Patient   RxID:   [id number]  Process Orders Check Orders Results:     [facility]: Check successful Tests Sent for requisitioning (May 18, 2010 1:53 PM):     06/01/2010: [facility] -- T-Basic Metabolic Panel [id number] (signed)     Prevention & Chronic Care Immunizations   Influenza vaccine: unable to take  (08/31/2007)   Influenza vaccine deferral: Refused  (06/24/2009)    Tetanus booster: Not documented   Td booster deferral: Refused  (06/24/2009)    Pneumococcal vaccine: Not documented   Pneumococcal vaccine deferral: Refused  (06/24/2009)    H. zoster vaccine: Not documented   H. zoster vaccine deferral: Refused  (06/24/2009)  Colorectal Screening   Hemoccult: negative  (03/19/2004)   Hemoccult action/deferral:  Deferred  (06/24/2009)    Colonoscopy: Not documented   Colonoscopy action/deferral: Deferred  (06/24/2009)  Other Screening   Pap smear: Not documented   Pap smear action/deferral: Not indicated S/P hysterectomy  (04/24/2009)    Mammogram: ASSESSMENT: Negative - BI-RADS 1^MM DIGITAL SCREENING  (07/08/2009)   Mammogram action/deferral: Ordered  (06/24/2009)   Mammogram due: 02/15/2010    DXA bone density scan: Not documented   DXA bone density action/deferral: Deferred  (06/09/2009)   Smoking status: quit  (05/18/2010)  Lipids   Total Cholesterol: 186  (12/08/2009)   LDL: 110  (12/08/2009)   LDL Direct: Not documented   HDL: 58  (12/08/2009)   Triglycerides: 91  (12/08/2009)    SGOT (AST): 19  (04/08/2010)   SGPT (ALT): 18  (04/08/2010)   Alkaline phosphatase: 92  (04/08/2010)   Total bilirubin: 0.4  (04/08/2010)    Lipid flowsheet reviewed?: Yes   Progress toward LDL goal: At goal  Hypertension   Last Blood Pressure: 147 / 74  (05/18/2010)   Serum creatinine: 0.65  (04/08/2010)   BMP action: Ordered   Serum potassium 4.0  (04/08/2010)    Hypertension flowsheet reviewed?: Yes   Progress toward BP goal: Unchanged  Self-Management Support :   Personal Goals (by the next clinic visit) :      Personal blood pressure goal: 140/90  (06/09/2009)     Personal LDL goal: 100  (06/09/2009)    Patient will work on the following items until the next clinic visit to reach self-care goals:     Medications and monitoring: take my medicines every day, bring all of my medications to every visit  (05/18/2010)     Eating: drink diet soda or water instead of juice or soda, eat more vegetables, use fresh or frozen vegetables, eat foods that are low in salt, eat fruit for snacks and desserts, limit or avoid alcohol  (05/18/2010)     Activity: take a 30 minute walk every day  (05/18/2010)     Other: has had increased chips and fried foods and added salt - states going to start "eating  better" -  states has not been able to exercise since sore from fall  (11/04/2009)    Hypertension self-management support: Written self-care plan, Education handout, Resources for patients handout  (05/18/2010)   Hypertension self-care plan printed.   Hypertension education handout printed    Lipid self-management support: Written self-care plan, Education handout, Resources for patients handout  (05/18/2010)   Lipid self-care plan printed.   Lipid education handout printed      Resource handout printed.

## 2010-11-10 NOTE — Progress Notes (Signed)
----   Converted from flag ---- ---- 04/08/2010 8:54 AM, Chinita Pester RN wrote:   ---- 04/07/2010 9:04 AM, Shon Hough wrote: I have been tried reaching this patient yesterday and this morning.  I did leave amessage for the paient to call back.  I also mailed an appointment for her PCP on 05/04/2010 at 3:30pm.  If patient does return phone messages I will give her an appt with the [facility] Dr. as well.  ---- 04/06/2010 10:22 AM, Chinita Pester RN wrote:   ---- 04/03/2010 11:00 AM, Vassie Loll MD wrote: Patient call on 04/03/10 in order to get her amlodipine refill; she was also complaining of lower extremety swelling which is new; on her records last time she had blood work done was 8, 2010; please schedule a followup ASAP, so we can check renal function, electrolytes and find out why is she having this problem. Please call patient with appointment details.  Thanks!!!!!! ------------------------------

## 2010-11-10 NOTE — Miscellaneous (Signed)
  Clinical Lists Changes  Orders: Added new Test order of T-Culture, Urine [phone]) - Signed Added new Test order of T-Urinalysis ([id number]) - Signed Added new Test order of T-CBC w/Diff ([id number]) - Signed Added new Test order of T-CMP with Estimated GFR ([id number]) - Signed     Process Orders Check Orders Results:     [facility]: Check successful Tests Sent for requisitioning (April 08, 2010 2:23 PM):     04/08/2010: [facility] -- T-Culture, Urine [[id number]] (signed)     04/08/2010: [facility] -- T-Urinalysis [[id number]] (signed)     04/08/2010: [facility] -- T-CBC w/Diff [[id number]] (signed)     04/08/2010: [facility] -- T-CMP with Estimated GFR [[id number]] (signed)

## 2010-11-10 NOTE — Assessment & Plan Note (Signed)
Summary: Gilford Rile)  Knot in breast   Vital Signs:  Patient profile:   75 year old female Height:      68 inches (172.72 cm) Weight:      284.7 pounds (129.41 kg) BMI:     43.44 Temp:     97.0 degrees F oral Pulse rate:   93 / minute BP sitting:   149 / 76  (left arm) Cuff size:   large  Vitals Entered By: Krystal Eaton Duncan Dull) (November 19, 2009 9:55 AM) CC: "knot" on right breast-pt also c/o right shoulder pain that has been bothering her since a fall in 09/2009 Is Patient Diabetic? No Pain Assessment Patient in pain? yes     Location: right shoulder Intensity: 8 Type: aching/nagging Onset of pain  intermittent s/p fall in 09/2009 Nutritional Status BMI of > 30 = obese  Have you ever been in a relationship where you felt threatened, hurt or afraid?No   Does patient need assistance? Functional Status Self care Ambulation Normal   Primary Care Provider:  Darnelle Maffucci MD  CC:  "knot" on right breast-pt also c/o right shoulder pain that has been bothering her since a fall in 09/2009.  History of Present Illness: 75 y/o woman recently seen 1/25 for shoulder pain secondary to fall comes to the clinic complaining of unresolved pain  She was prescribed vicodin at the last visit for the pain as she has been on it for years for arthritis of knee and back. She did not refill it becuae she is worried that insurance will not pay for it.   She also complained of feeling a knot on her breast but could not locate where she felt it while she was on the clinic  Depression History:      The patient is having a depressed mood most of the day and has a diminished interest in her usual daily activities.        Comments:  pt has not been taking her celexa and will refill it.Krystal Eaton [facility])  November 19, 2009 10:58 AM .   Preventive Screening-Counseling & Management  Alcohol-Tobacco     Alcohol drinks/day: 0     Smoking Status: quit     Year Quit: 25 yrs +  Current  Medications (verified): 1)  Amlodipine Besylate 5 Mg Tabs (Amlodipine Besylate) .... Take 1 Tablet By Mouth Once A Day 2)  Colace 100 Mg Caps (Docusate Sodium) .... Take 1 Capsule By Mouth Once A Day 3)  Lactaid  Tabs (Lactase Tabs) .... Take One Tab Before Taking Milk Products 4)  Celexa 40 Mg Tabs (Citalopram Hydrobromide) .... Take 1 Tablet By Mouth Once A Day 5)  Vicodin 5-500 Mg  Tabs (Hydrocodone-Acetaminophen) .... Take 1 Tablet By Mouth Up To Every 4 Hours As Needed For Pain 6)  Ra Loratadine 10 Mg Tabs (Loratadine) .... Take 1 Tablet By Mouth Once A Day 7)  Lipitor 40 Mg Tabs (Atorvastatin Calcium) .... Take 1 Tablet By Mouth At Bedtime For Your Cholesterol 8)  Omeprazole 40 Mg Cpdr (Omeprazole) .... Take 1 Capsule By Mouth Once A Day 9)  Hydrocodone-Acetaminophen 5-500 Mg Tabs (Hydrocodone-Acetaminophen) 10)  Cyclobenzaprine Hcl 10 Mg Tabs (Cyclobenzaprine Hcl) .... Three Times A Day  Allergies: 1)  ! Allegra-D 12 Hour  Physical Exam  General:  alert, well-hydrated, and overweight-appearing.   Head:  normocephalic and atraumatic.   Eyes:  vision grossly intact, pupils equal, pupils round, pupils reactive to light, and pupils react to  accomodation.   Ears:  R ear normal and L ear normal.   Nose:  no external deformity.   Neck:  supple, full ROM, and no JVD.   Breasts:  skin/areolae normal, no masses, no abnormal thickening, no nipple discharge, no tenderness, and no adenopathy.   Lungs:  normal respiratory effort, no accessory muscle use, normal breath sounds, and no wheezes.   Heart:  normal rate, regular rhythm, no murmur, no gallop, and no rub.   Abdomen:  soft, non-tender, normal bowel sounds, no distention, and no masses.   Pulses:  dorsalis pedis pulses normal bilaterally  Extremities:  no edema Neurologic:  OrientedX3, cranial nerver 2-12 intact,strength good in all extremities, sensations normal to light touch, reflexes 2+ b/l, gait normal    Impression &  Recommendations:  Problem # 1:  BURSITIS, RIGHT SHOULDER (ICD-726.10) Asked patient to get an XRa of shoulder for a definie diagnosis but she says she will come back later. Will give flexeril for spasms. She showed me the prescription that was given at last visit so she has not got it filled. Asked her to get it filled as its generic and the insurance provider should cover that. Asked her to call if she could not get the prescription.   Orders: T-DG Shoulder 2V Bilat (10272)  Problem # 2:  HYPERTENSION (ICD-401.9) Stable. continue current regimen.   Her updated medication list for this problem includes:    Amlodipine Besylate 5 Mg Tabs (Amlodipine besylate) .[name]... Take 1 tablet by mouth once a day  BP today: 149/76 Prior BP: 143/81 (11/04/2009)  Labs Reviewed: K+: 3.8 (06/09/2009) Creat: : 0.72 (06/09/2009)   Chol: 174 (04/24/2009)   HDL: 53 (04/24/2009)   LDL: 102 (04/24/2009)   TG: 97 (04/24/2009)  Problem # 3:  HYPERLIPIDEMIA (ICD-272.4) At goal. Continue lipitor   Her updated medication list for this problem includes:    Lipitor 40 Mg Tabs (Atorvastatin calcium) .[name]... Take 1 tablet by mouth at bedtime for your cholesterol    HDL:53 (04/24/2009), 49 (06/12/2008)  LDL:102 (04/24/2009), 99 (53/66/4403)  Chol:174 (04/24/2009), 168 (06/12/2008)  Trig:97 (04/24/2009), 100 (06/12/2008)  Problem # 4:  BACK PAIN WITH RADICULOPATHY (ICD-729.2) Continues to have pain. Suggested non wt bearing exercises. She says she is going to resume water aerobics which she use to do in the past.   Complete Medication List: 1)  Amlodipine Besylate 5 Mg Tabs (Amlodipine besylate) .... Take 1 tablet by mouth once a day 2)  Colace 100 Mg Caps (Docusate sodium) .... Take 1 capsule by mouth once a day 3)  Lactaid Tabs (Lactase tabs) .... Take one tab before taking milk products 4)  Celexa 40 Mg Tabs (Citalopram hydrobromide) .... Take 1 tablet by mouth once a day 5)  Vicodin 5-500 Mg Tabs  (Hydrocodone-acetaminophen) .... Take 1 tablet by mouth up to every 4 hours as needed for pain 6)  Ra Loratadine 10 Mg Tabs (Loratadine) .... Take 1 tablet by mouth once a day 7)  Lipitor 40 Mg Tabs (Atorvastatin calcium) .... Take 1 tablet by mouth at bedtime for your cholesterol 8)  Omeprazole 40 Mg Cpdr (Omeprazole) .... Take 1 capsule by mouth once a day 9)  Hydrocodone-acetaminophen 5-500 Mg Tabs (Hydrocodone-acetaminophen) 10)  Cyclobenzaprine Hcl 10 Mg Tabs (Cyclobenzaprine hcl) .... Three times a day  Patient Instructions: 1)  Come back when you notice any knots/lumps on your breast again.  2)  Come for the Shoulder Xray at the earliest.  3)  Please schedule a  follow-up appointment in 2 weeks. 4)  It is important that you exercise regularly at least 20 minutes 5 times a week. If you develop chest pain, have severe difficulty breathing, or feel very tired , stop exercising immediately and seek medical attention. Prescriptions: CYCLOBENZAPRINE HCL 10 MG TABS (CYCLOBENZAPRINE HCL) three times a day  #60 x 0   Entered and Authorized by:   Bethel Born MD   Signed by:   Bethel Born MD on 11/19/2009   Method used:   Print then Give to Patient   RxID:   [id number]    Prevention & Chronic Care Immunizations   Influenza vaccine: unable to take  (08/31/2007)   Influenza vaccine deferral: Refused  (06/24/2009)    Tetanus booster: Not documented   Td booster deferral: Refused  (06/24/2009)    Pneumococcal vaccine: Not documented   Pneumococcal vaccine deferral: Refused  (06/24/2009)    H. zoster vaccine: Not documented   H. zoster vaccine deferral: Refused  (06/24/2009)  Colorectal Screening   Hemoccult: negative  (03/19/2004)   Hemoccult action/deferral: Deferred  (06/24/2009)    Colonoscopy: Not documented   Colonoscopy action/deferral: Deferred  (06/24/2009)  Other Screening   Pap smear: Not documented   Pap smear action/deferral: Not indicated S/P hysterectomy   (04/24/2009)    Mammogram: ASSESSMENT: Negative - BI-RADS 1^MM DIGITAL SCREENING  (07/08/2009)   Mammogram action/deferral: Ordered  (06/24/2009)   Mammogram due: 02/15/2010    DXA bone density scan: Not documented   DXA bone density action/deferral: Deferred  (06/09/2009)   Smoking status: quit  (11/19/2009)  Lipids   Total Cholesterol: 174  (04/24/2009)   LDL: 102  (04/24/2009)   LDL Direct: Not documented   HDL: 53  (04/24/2009)   Triglycerides: 97  (04/24/2009)    SGOT (AST): Not documented   SGPT (ALT): Not documented   Alkaline phosphatase: Not documented   Total bilirubin: Not documented  Hypertension   Last Blood Pressure: 149 / 76  (11/19/2009)   Serum creatinine: 0.72  (06/09/2009)   BMP action: Ordered   Serum potassium 3.8  (06/09/2009)  Self-Management Support :   Personal Goals (by the next clinic visit) :      Personal blood pressure goal: 140/90  (06/09/2009)     Personal LDL goal: 100  (06/09/2009)    Patient will work on the following items until the next clinic visit to reach self-care goals:     Medications and monitoring: take my medicines every day  (11/19/2009)     Eating: eat more vegetables, eat foods that are low in salt, eat baked foods instead of fried foods  (11/19/2009)     Activity: join a walking program  (11/19/2009)     Other: has had increased chips and fried foods and added salt - states going to start "eating better" - states has not been able to exercise since sore from fall  (11/04/2009)    Hypertension self-management support: Written self-care plan  (06/09/2009)    Lipid self-management support: Not documented

## 2010-11-10 NOTE — Assessment & Plan Note (Signed)
Summary: FU VISIT/DS   Vital Signs:  Patient profile:   75 year old female Height:      68 inches (172.72 cm) Weight:      289.5 pounds (131.59 kg) BMI:     44.18 Temp:     96.6 degrees F (35.89 degrees C) oral Pulse rate:   95 / minute BP sitting:   109 / 66  (right arm)  Vitals Entered By: Stanton Kidney Ditzler RN (August 10, 2010 2:22 PM) CC: back pain Is Patient Diabetic? No Pain Assessment Patient in pain? no      Nutritional Status BMI of 25 - 29 = overweight Nutritional Status Detail appetite good  Have you ever been in a relationship where you felt threatened, hurt or afraid?denies   Does patient need assistance? Functional Status Self care Ambulation Normal Comments FU - doing well. Pain in both shoulder blades and SOB x 7 days - comes and goes. Needs new knee brace - right.   Primary Care Provider:  Darnelle Maffucci MD  CC:  back pain.  History of Present Illness: 75 y/o woman with PMH of HTN, shoulder pain comes to the clinic for a followup for back pain for 2 weeks.   the pain started 2 weeks ago, located at both sapularity regions. ,is about 4/10 intesity,grabbing in quality, radiates nowhere,  it has not been getting worse over the past weeks,it is brought on by laying in bed,   it usually lasts for few minutes ,It comes and goes, occurs about 1-2 times a day, it is alleviated by massage, aggravated by certain movements, associated with nothing, Denies SOB, Denies CP.  Pt would also want knee brace for chronic arthritis.   Pt has been tolerating BP meds well, her BP today in under good control.  Pt reports improved urinary urgency after out discussion last week, and she is performing pelvic muscle strengthening exercises.   Patient is feeling well and denies CP, abdominal pain, nausea, vomiting, HA's, palpitations, blurred vision. fever, chills, diarrhea, constipation or SOB.   Depression History:      The patient denies a depressed mood most of the day and a  diminished interest in her usual daily activities.         Preventive Screening-Counseling & Management  Alcohol-Tobacco     Alcohol drinks/day: 0     Smoking Status: quit     Year Quit: 25 yrs +  Caffeine-Diet-Exercise     Does Patient Exercise: no     Type of exercise: ROM     Times/week:    3  Current Medications (verified): 1)  Triamterene-Hctz 37.5-25 Mg Tabs (Triamterene-Hctz) .... Take 1 Tablet By Mouth Once A Day 2)  Colace 100 Mg Caps (Docusate Sodium) .... Take 1 Capsule By Mouth Once A Day 3)  Lactaid  Tabs (Lactase Tabs) .... Take One Tab Before Taking Milk Products 4)  Celexa 40 Mg Tabs (Citalopram Hydrobromide) .... Take 1 Tablet By Mouth Once A Day 5)  Vicodin 5-500 Mg  Tabs (Hydrocodone-Acetaminophen) .... Take 1 Tablet By Mouth Up To Every 4 Hours As Needed For Pain 6)  Ra Loratadine 10 Mg Tabs (Loratadine) .... Take 1 Tablet By Mouth Once A Day 7)  Lipitor 40 Mg Tabs (Atorvastatin Calcium) .... Take 1 Tablet By Mouth At Bedtime For Your Cholesterol 8)  Omeprazole 40 Mg Cpdr (Omeprazole) .... Take 1 Capsule By Mouth Once A Day 9)  Hydrocodone-Acetaminophen 5-500 Mg Tabs (Hydrocodone-Acetaminophen) 10)  Cyclobenzaprine Hcl 10 Mg Tabs (  Cyclobenzaprine Hcl) .... Three Times A Day 11)  Lisinopril 10 Mg Tabs (Lisinopril) .... Take 1 Tablet By Mouth Once A Day  Allergies: 1)  ! Allegra-D 12 Hour  Review of Systems       negative except per HPI  Physical Exam  General:  alert, well-hydrated, and overweight-appearing.   Mouth:   pharynx pink and moist, no erythema, and no exudates.    Lungs:  normal respiratory effort, no accessory muscle use, normal breath sounds, and no wheezes.   Heart:  normal rate, regular rhythm, no murmur, no gallop, and no rub.   Abdomen:  soft, non-tender, normal bowel sounds, no distention, and no masses.   Msk:  R shouler pain, better compared to last visit, with limited ROM Extremities:  no edema Neurologic:  non focal    Impression  & Recommendations:  Problem # 1:  BACK PAIN WITH RADICULOPATHY (ICD-729.2) Assessment Comment Only shoulder pain, is mainly MSK in origin, i have advised pt to take NSAID as needed.   Problem # 2:  URINARY URGENCY (UJW-119.14) Assessment: Comment Only improved with kegals.   Problem # 3:  OSTEOARTHRITIS, KNEE, RIGHT (ICD-715.96) Assessment: Comment Only will get knee brace for pt. and give small dose of vicodin as needed.   Her updated medication list for this problem includes:    Vicodin 5-500 Mg Tabs (Hydrocodone-acetaminophen) .[name]... Take 1 tablet by mouth up to every 4 hours as needed for pain    Hydrocodone-acetaminophen 5-500 Mg Tabs (Hydrocodone-acetaminophen)    Cyclobenzaprine Hcl 10 Mg Tabs (Cyclobenzaprine hcl) .[name]... Three times a day  Problem # 4:  THYROID NODULE (ICD-241.0) Assessment: Comment Only pt HR is slightly elevated with no other symptoms, will check TSH.  Orders: T-TSH ([id number])  Problem # 5:  HYPERLIPIDEMIA (ICD-272.4) Assessment: Comment Only Well controlled on current treatment, No new changes made today, Will continue to monitor.   Her updated medication list for this problem includes:    Lipitor 40 Mg Tabs (Atorvastatin calcium) .[name]... Take 1 tablet by mouth at bedtime for your cholesterol  Labs Reviewed: SGOT: 19 (04/08/2010)   SGPT: 18 (04/08/2010)   HDL:58 (12/08/2009), 53 (04/24/2009)  LDL:110 (12/08/2009), 102 (04/24/2009)  Chol:186 (12/08/2009), 174 (04/24/2009)  Trig:91 (12/08/2009), 97 (04/24/2009)  Problem # 6:  HYPERTENSION (ICD-401.9) Assessment: Comment Only Well controlled on current treatment, No new changes made today, Will continue to monitor.   Her updated medication list for this problem includes:    Triamterene-hctz 37.5-25 Mg Tabs (Triamterene-hctz) .[name]... Take 1 tablet by mouth once a day    Lisinopril 10 Mg Tabs (Lisinopril) .[name]... Take 1 tablet by mouth once a day  Orders: T-Comprehensive Metabolic Panel  ([id number])  BP today: 109/66 Prior BP: 137/84 (06/01/2010)  Labs Reviewed: K+: 4.1 (06/01/2010) Creat: : 0.80 (06/01/2010)   Chol: 186 (12/08/2009)   HDL: 58 (12/08/2009)   LDL: 110 (12/08/2009)   TG: 91 (12/08/2009)  Complete Medication List: 1)  Triamterene-hctz 37.5-25 Mg Tabs (Triamterene-hctz) .... Take 1 tablet by mouth once a day 2)  Colace 100 Mg Caps (Docusate sodium) .... Take 1 capsule by mouth once a day 3)  Lactaid Tabs (Lactase tabs) .... Take one tab before taking milk products 4)  Celexa 40 Mg Tabs (Citalopram hydrobromide) .... Take 1 tablet by mouth once a day 5)  Vicodin 5-500 Mg Tabs (Hydrocodone-acetaminophen) .... Take 1 tablet by mouth up to every 4 hours as needed for pain 6)  Ra Loratadine 10 Mg Tabs (Loratadine) .... Take 1  tablet by mouth once a day 7)  Lipitor 40 Mg Tabs (Atorvastatin calcium) .... Take 1 tablet by mouth at bedtime for your cholesterol 8)  Omeprazole 40 Mg Cpdr (Omeprazole) .... Take 1 capsule by mouth once a day 9)  Hydrocodone-acetaminophen 5-500 Mg Tabs (Hydrocodone-acetaminophen) 10)  Cyclobenzaprine Hcl 10 Mg Tabs (Cyclobenzaprine hcl) .... Three times a day 11)  Lisinopril 10 Mg Tabs (Lisinopril) .... Take 1 tablet by mouth once a day  Patient Instructions: 1)  Please schedule a follow-up appointment in 3-6 months. Prescriptions: HYDROCODONE-ACETAMINOPHEN 5-500 MG TABS (HYDROCODONE-ACETAMINOPHEN)   #40 x 0   Entered and Authorized by:   Darnelle Maffucci MD   Signed by:   Darnelle Maffucci MD on 08/10/2010   Method used:   Print then Give to Patient   RxID:   [id number] LIPITOR 40 MG TABS (ATORVASTATIN CALCIUM) Take 1 tablet by mouth at bedtime for your cholesterol  #30 x 3   Entered and Authorized by:   Darnelle Maffucci MD   Signed by:   Darnelle Maffucci MD on 08/10/2010   Method used:   Electronically to        [facility] [phone]* (retail)       [address] [place]  [address]       Ph: [phone] or  [phone]       Fax: [phone]   RxID:   [id number] TRIAMTERENE-HCTZ 37.5-25 MG TABS (TRIAMTERENE-HCTZ) Take 1 tablet by mouth once a day  #30 x 3   Entered and Authorized by:   Darnelle Maffucci MD   Signed by:   Darnelle Maffucci MD on 08/10/2010   Method used:   Electronically to        [facility] [phone]* (retail)       [address] [place], [place]  [address]       Ph: [phone] or [phone]       Fax: [phone]   RxID:   [id number]    Orders Added: 1)  Est. Patient Level IV [71062] 2)  T-Comprehensive Metabolic Panel [[id number]] 3)  T-TSH [[id number]]   Process Orders Check Orders Results:     [facility]: Check successful Tests Sent for requisitioning (August 10, 2010 4:48 PM):     08/10/2010: [facility] -- T-Comprehensive Metabolic Panel [[id number]] (signed)     08/10/2010: [facility] -- T-TSH [phone] (signed)     Prevention & Chronic Care Immunizations   Influenza vaccine: unable to take  (08/31/2007)   Influenza vaccine deferral: Refused  (08/10/2010)    Tetanus booster: Not documented   Td booster deferral: Refused  (06/24/2009)    Pneumococcal vaccine: Not documented   Pneumococcal vaccine deferral: Refused  (06/24/2009)    H. zoster vaccine: Not documented   H. zoster vaccine deferral: Refused  (06/24/2009)  Colorectal Screening   Hemoccult: negative  (03/19/2004)   Hemoccult action/deferral: Deferred  (06/24/2009)    Colonoscopy: Not documented   Colonoscopy action/deferral: Deferred  (06/24/2009)  Other Screening   Pap smear: Not documented   Pap smear action/deferral: Not indicated S/P hysterectomy  (04/24/2009)    Mammogram: ASSESSMENT: Negative - BI-RADS 1^MM DIGITAL SCREENING  (07/08/2009)   Mammogram action/deferral: Ordered  (06/24/2009)   Mammogram due: 02/15/2010    DXA bone density scan: Not documented   DXA bone density action/deferral: Deferred   (06/09/2009)   Smoking status: quit  (  08/10/2010)  Lipids   Total Cholesterol: 186  (12/08/2009)   LDL: 110  (12/08/2009)   LDL Direct: Not documented   HDL: 58  (12/08/2009)   Triglycerides: 91  (12/08/2009)    SGOT (AST): 19  (04/08/2010)   SGPT (ALT): 18  (04/08/2010) CMP ordered    Alkaline phosphatase: 92  (04/08/2010)   Total bilirubin: 0.4  (04/08/2010)    Lipid flowsheet reviewed?: Yes   Progress toward LDL goal: At goal  Hypertension   Last Blood Pressure: 109 / 66  (08/10/2010)   Serum creatinine: 0.80  (06/01/2010)   BMP action: Ordered   Serum potassium 4.1  (06/01/2010) CMP ordered     Hypertension flowsheet reviewed?: Yes   Progress toward BP goal: At goal  Self-Management Support :   Personal Goals (by the next clinic visit) :      Personal blood pressure goal: 140/90  (06/09/2009)     Personal LDL goal: 100  (06/09/2009)    Patient will work on the following items until the next clinic visit to reach self-care goals:     Medications and monitoring: take my medicines every day  (08/10/2010)     Eating: drink diet soda or water instead of juice or soda, eat more vegetables, use fresh or frozen vegetables, eat foods that are low in salt, eat fruit for snacks and desserts, limit or avoid alcohol  (08/10/2010)     Activity: take a 30 minute walk every day  (08/10/2010)     Other: no change since last viist  (06/01/2010)    Hypertension self-management support: Written self-care plan, Education handout  (08/10/2010)   Hypertension self-care plan printed.   Hypertension education handout printed    Lipid self-management support: Written self-care plan, Education handout  (08/10/2010)   Lipid self-care plan printed.   Lipid education handout printed

## 2010-11-10 NOTE — Progress Notes (Signed)
Summary: Muscle spasms.  Phone Note Call from Patient   Caller: Patient Call For: Christine Maffucci MD Summary of Call: Call from pt said that she has a knot in her breast.  Shoulder pain and goes above ear and neck.  Muscle spasms.  Wants an appointment for tomorrow.  Pt given an appointmentfor 11/19/2009. Angelina Ok RN  November 18, 2009 10:36 AM  Initial call taken by: Angelina Ok RN,  November 18, 2009 10:36 AM  Follow-up for Phone Call        Agree with plan. Follow-up by: Margarito Liner MD,  November 18, 2009 10:45 AM

## 2010-11-10 NOTE — Medication Information (Signed)
Summary: [place] [employer]  [place] [employer]   Imported By: Florinda Marker 11/05/2009 13:48:06  _____________________________________________________________________  External Attachment:    Type:   Image     Comment:   External Document

## 2010-11-10 NOTE — Assessment & Plan Note (Signed)
Summary: EST-CK/FU/MEDS/CFB   Vital Signs:  Patient profile:   75 year old female Height:      68 inches (172.72 cm) Weight:      305.7 pounds (138.95 kg) BMI:     46.65 Temp:     96.8 degrees F (36 degrees C) oral Pulse rate:   100 / minute BP sitting:   150 / 91  (right arm)  Vitals Entered By: Stanton Kidney Ditzler RN (May 04, 2010 3:51 PM) Is Patient Diabetic? No Pain Assessment Patient in pain? yes     Location: right knee Intensity: 8 Type: aching Onset of pain  years Nutritional Status BMI of > 30 = obese Nutritional Status Detail appetite good  Have you ever been in a relationship where you felt threatened, hurt or afraid?denies   Does patient need assistance? Functional Status Self care Ambulation Normal Comments Past 3 weeks leaking  urine and discomfort low abd. Both feet swollen past month.   Primary Care Provider:  Darnelle Maffucci MD   History of Present Illness: 75 y/o woman with PMH of HTN, shoulder pain comes to the clinic for follow up visit.  Her shoulder pain is controlled with vicodin and flexeril  c/o of dysuria, and urinary urgency as she is running to the toilet she has an urge to urinate and sometimes is unable to hold her urine before she reached her WC, first noticed it 3 weeks ago.   c/o right knee pain, however this is chronic, and stable on current regiment.  Pt denies fever, chills, or any other complaints, and otherwise doing well.   Depression History:      The patient denies a depressed mood most of the day and a diminished interest in her usual daily activities.         Preventive Screening-Counseling & Management  Alcohol-Tobacco     Alcohol drinks/day: 0     Smoking Status: quit     Year Quit: 25 yrs +  Caffeine-Diet-Exercise     Does Patient Exercise: no     Type of exercise: ROM     Times/week:    3  Current Medications (verified): 1)  Triamterene-Hctz 37.5-25 Mg Tabs (Triamterene-Hctz) .... Take 1 Tablet By Mouth Once A  Day 2)  Colace 100 Mg Caps (Docusate Sodium) .... Take 1 Capsule By Mouth Once A Day 3)  Lactaid  Tabs (Lactase Tabs) .... Take One Tab Before Taking Milk Products 4)  Celexa 40 Mg Tabs (Citalopram Hydrobromide) .... Take 1 Tablet By Mouth Once A Day 5)  Vicodin 5-500 Mg  Tabs (Hydrocodone-Acetaminophen) .... Take 1 Tablet By Mouth Up To Every 4 Hours As Needed For Pain 6)  Ra Loratadine 10 Mg Tabs (Loratadine) .... Take 1 Tablet By Mouth Once A Day 7)  Lipitor 40 Mg Tabs (Atorvastatin Calcium) .... Take 1 Tablet By Mouth At Bedtime For Your Cholesterol 8)  Omeprazole 40 Mg Cpdr (Omeprazole) .... Take 1 Capsule By Mouth Once A Day 9)  Hydrocodone-Acetaminophen 5-500 Mg Tabs (Hydrocodone-Acetaminophen) 10)  Cyclobenzaprine Hcl 10 Mg Tabs (Cyclobenzaprine Hcl) .... Three Times A Day  Allergies: 1)  ! Allegra-D 12 Hour  Review of Systems       Per HPI  Physical Exam  General:  alert, well-hydrated, and overweight-appearing.   Neck:  supple, full ROM, and no JVD.   Lungs:  normal respiratory effort, no accessory muscle use, normal breath sounds, and no wheezes.   Heart:  normal rate, regular rhythm, no murmur, no gallop,  and no rub.   Abdomen:  soft, non-tender, normal bowel sounds, no distention, and no masses.   Msk:  R shouler pain, better compared to last visit, with limited ROM Pulses:  dorsalis pedis pulses normal bilaterally  Extremities:  no edema Neurologic:  OrientedX3, cranial nerver 2-12 intact,strength good in all extremities, sensations normal to light touch, reflexes 2+ b/l, gait normal  Skin:   turgor normal and no rashes.  Psych:  Oriented X3, memory intact for recent and remote, normally interactive, good eye contact, not anxious appearing, and not depressed appearing.    Impression & Recommendations:  Problem # 1:  URINARY URGENCY (EAV-409.81) Patient has similar symptoms in 06/2009, therefore her hctz was changed to amlodipine, this has not changed her symptoms. now  she is running to the toilet she has an urge to urinate and sometimes is unable to hold her urine before she reached her WC.  Patient may benefit from bladder training and oxybutitin, I have discussed several stratagies including scheduled toilet week.  UA in the clinic was negative.  Problem # 2:  OSTEOARTHRITIS, KNEE, RIGHT (ICD-715.96) c/o of some pain, however this is stable and chronic.  Her updated medication list for this problem includes:    Vicodin 5-500 Mg Tabs (Hydrocodone-acetaminophen) .[name]... Take 1 tablet by mouth up to every 4 hours as needed for pain    Hydrocodone-acetaminophen 5-500 Mg Tabs (Hydrocodone-acetaminophen)    Cyclobenzaprine Hcl 10 Mg Tabs (Cyclobenzaprine hcl) .[name]... Three times a day  Problem # 3:  HYPERTENSION (ICD-401.9) patient has been having LE edema 2/2 to amlodipine, will change back to triamterine-hctz. will check BMET on next f/u Her updated medication list for this problem includes:    Triamterene-hctz 37.5-25 Mg Tabs (Triamterene-hctz) .[name]... Take 1 tablet by mouth once a day  Problem # 4:  HYPERLIPIDEMIA (ICD-272.4) Well controlled on current treatment, No new changes made today, Will continue to monitor.   Her updated medication list for this problem includes:    Lipitor 40 Mg Tabs (Atorvastatin calcium) .[name]... Take 1 tablet by mouth at bedtime for your cholesterol  Labs Reviewed: SGOT: 19 (04/08/2010)   SGPT: 18 (04/08/2010)   HDL:58 (12/08/2009), 53 (04/24/2009)  LDL:110 (12/08/2009), 102 (04/24/2009)  Chol:186 (12/08/2009), 174 (04/24/2009)  Trig:91 (12/08/2009), 97 (04/24/2009)  Complete Medication List: 1)  Triamterene-hctz 37.5-25 Mg Tabs (Triamterene-hctz) .... Take 1 tablet by mouth once a day 2)  Colace 100 Mg Caps (Docusate sodium) .... Take 1 capsule by mouth once a day 3)  Lactaid Tabs (Lactase tabs) .... Take one tab before taking milk products 4)  Celexa 40 Mg Tabs (Citalopram hydrobromide) .... Take 1 tablet by mouth once a  day 5)  Vicodin 5-500 Mg Tabs (Hydrocodone-acetaminophen) .... Take 1 tablet by mouth up to every 4 hours as needed for pain 6)  Ra Loratadine 10 Mg Tabs (Loratadine) .... Take 1 tablet by mouth once a day 7)  Lipitor 40 Mg Tabs (Atorvastatin calcium) .... Take 1 tablet by mouth at bedtime for your cholesterol 8)  Omeprazole 40 Mg Cpdr (Omeprazole) .... Take 1 capsule by mouth once a day 9)  Hydrocodone-acetaminophen 5-500 Mg Tabs (Hydrocodone-acetaminophen) 10)  Cyclobenzaprine Hcl 10 Mg Tabs (Cyclobenzaprine hcl) .... Three times a day  Patient Instructions: 1)  Please schedule a follow-up appointment in 2 weeks. Prescriptions: COLACE 100 MG CAPS (DOCUSATE SODIUM) Take 1 capsule by mouth once a day  #30 x 11   Entered and Authorized by:   Darnelle Maffucci MD  Signed by:   Darnelle Maffucci MD on 05/04/2010   Method used:   [facility]        [facility] [phone]* (retail)       [address] [place]  [address]       Ph: [phone] or [phone]       Fax: [phone]   RxID:   [id number] CELEXA 40 MG TABS (CITALOPRAM HYDROBROMIDE) Take 1 tablet by mouth once a day  #31 x 12   Entered and Authorized by:   Darnelle Maffucci MD   Signed by:   Darnelle Maffucci MD on 05/04/2010   Method used:   [facility] to        [facility] [phone]* (retail)       [address], [place]  [address]       Ph: [phone] or [phone]       Fax: [phone]   RxID:   [id number] OMEPRAZOLE 40 MG CPDR (OMEPRAZOLE) Take 1 capsule by mouth once a day  #30 x 2   Entered and Authorized by:   Darnelle Maffucci MD   Signed by:   Darnelle Maffucci MD on 05/04/2010   Method used:   [facility]ronically to        [facility] [phone]* (retail)       [address] [place]  [address]       Ph: [phone] or [phone]       Fax: [phone]   RxID:   [id number] LIPITOR 40 MG TABS (ATORVASTATIN CALCIUM) Take 1 tablet by mouth at bedtime for your  cholesterol  #30 x 3   Entered and Authorized by:   Darnelle Maffucci MD   Signed by:   Darnelle Maffucci MD on 05/04/2010   Method used:   [facility] to        [facility] [phone]* (retail)       [address], [place]  [address]       Ph: [phone] or [phone]       Fax: [phone]   RxID:   [id number] RA LORATADINE 10 MG TABS (LORATADINE) Take 1 tablet by mouth once a day  #30 x 6   Entered and Authorized by:   Darnelle Maffucci MD   Signed by:   Darnelle Maffucci MD on 05/04/2010   Method used:   [facility]        [facility] [phone]* (retail)       [address] [place]  [address]       Ph: [phone] or [phone]       Fax: [phone]   RxID:   [id number] TRIAMTERENE-HCTZ 37.5-25 MG TABS (TRIAMTERENE-HCTZ) Take 1 tablet by mouth once a day  #30 x 0   Entered and Authorized by:   Darnelle Maffucci MD   Signed by:   Darnelle Maffucci MD on 05/04/2010   Method used:   [facility] to        [facility] [phone]* (retail)       [address]       [place], [place]  [address]       Ph: [phone] or [phone]       Fax: [phone]  RxID:   [id number] TRIAMTERENE-HCTZ 37.5-25 MG TABS (TRIAMTERENE-HCTZ) Take 1 tablet by mouth once a day  #30 x 0   Entered and Authorized by:   Darnelle Maffucci MD   Signed by:   Darnelle Maffucci MD on 05/04/2010   Method used:   Electronically to        [facility] [id number]* (retail)       [address] [place], [place]  [address]       Ph: [phone]       Fax: [phone]   RxID:   [id number]    Prevention & Chronic Care Immunizations   Influenza vaccine: unable to take  (08/31/2007)   Influenza vaccine deferral: Refused  (06/24/2009)    Tetanus booster: Not documented   Td booster deferral: Refused  (06/24/2009)    Pneumococcal vaccine: Not documented   Pneumococcal vaccine deferral: Refused  (06/24/2009)    H. zoster vaccine: Not  documented   H. zoster vaccine deferral: Refused  (06/24/2009)  Colorectal Screening   Hemoccult: negative  (03/19/2004)   Hemoccult action/deferral: Deferred  (06/24/2009)    Colonoscopy: Not documented   Colonoscopy action/deferral: Deferred  (06/24/2009)  Other Screening   Pap smear: Not documented   Pap smear action/deferral: Not indicated S/P hysterectomy  (04/24/2009)    Mammogram: ASSESSMENT: Negative - BI-RADS 1^MM DIGITAL SCREENING  (07/08/2009)   Mammogram action/deferral: Ordered  (06/24/2009)   Mammogram due: 02/15/2010    DXA bone density scan: Not documented   DXA bone density action/deferral: Deferred  (06/09/2009)   Smoking status: quit  (05/04/2010)  Lipids   Total Cholesterol: 186  (12/08/2009)   LDL: 110  (12/08/2009)   LDL Direct: Not documented   HDL: 58  (12/08/2009)   Triglycerides: 91  (12/08/2009)    SGOT (AST): 19  (04/08/2010)   SGPT (ALT): 18  (04/08/2010)   Alkaline phosphatase: 92  (04/08/2010)   Total bilirubin: 0.4  (04/08/2010)    Lipid flowsheet reviewed?: Yes   Progress toward LDL goal: Unchanged  Hypertension   Last Blood Pressure: 150 / 91  (05/04/2010)   Serum creatinine: 0.65  (04/08/2010)   BMP action: Ordered   Serum potassium 4.0  (04/08/2010)    Hypertension flowsheet reviewed?: Yes   Progress toward BP goal: Unchanged  Self-Management Support :   Personal Goals (by the next clinic visit) :      Personal blood pressure goal: 140/90  (06/09/2009)     Personal LDL goal: 100  (06/09/2009)    Patient will work on the following items until the next clinic visit to reach self-care goals:     Medications and monitoring: take my medicines every day, bring all of my medications to every visit  (05/04/2010)     Eating: drink diet soda or water instead of juice or soda, eat more vegetables, use fresh or frozen vegetables, eat foods that are low in salt, eat fruit for snacks and desserts, limit or avoid alcohol  (05/04/2010)      Activity: join a walking program  (11/19/2009)     Other: has had increased chips and fried foods and added salt - states going to start "eating better" - states has not been able to exercise since sore from fall  (11/04/2009)    Hypertension self-management support: Written self-care plan, Education handout, Resources for patients handout  (05/04/2010)   Hypertension self-care plan printed.  Hypertension education handout printed    Lipid self-management support: Written self-care plan, Education handout, Resources for patients handout  (05/04/2010)   Lipid self-care plan printed.   Lipid education handout printed      Resource handout printed.  Laboratory Results   Urine Tests  Date/Time Received: 05/04/10 4PM Date/Time Reported: same  Routine Urinalysis   Color: yellow Appearance: Clear Glucose: negative   (Normal Range: Negative) Bilirubin: negative   (Normal Range: Negative) Ketone: negative   (Normal Range: Negative) Spec. Gravity: >=1.030   (Normal Range: 1.003-1.035) Blood: negative   (Normal Range: Negative) pH: 5.0   (Normal Range: 5.0-8.0) Protein: negative   (Normal Range: Negative) Urobilinogen: 0.2   (Normal Range: 0-1) Nitrite: negative   (Normal Range: Negative) Leukocyte Esterace: negative   (Normal Range: Negative)

## 2010-11-10 NOTE — Progress Notes (Signed)
Summary: phone/gg  Phone Note Call from Patient   Caller: Patient Summary of Call: Pt called to ask question about meds that were ordered yesterday.  She is unclear about new med added, lisinopril. Today she took 2  Triamterene-hctz 37.5-25 Mg Tabs and no lisinopril.   I explained to her to take 1  Triamterene-hctz 37.5-25 Mg Tabs and 1 lisinopril a day starting tomorrow.  No lisinopril today as she took 2 of the other med.  She voices understanding. Initial call taken by: Merrie Roof RN,  May 19, 2010 2:54 PM  Follow-up for Phone Call        I agree, thanks. Follow-up by: Darnelle Maffucci MD,  May 19, 2010 3:24 PM

## 2010-11-10 NOTE — Progress Notes (Signed)
Summary: Pharmacy request change in med  Phone Note From Pharmacy   [facility] [address] [address] Request: Needs authorization from insurer Summary of Call: Covering for Marcelle Smiling while she is out on vacation  Received a prior authorization for patient's Lipitor 40 mg from her pharmacy.  MD to to resubmit a altrnative prescription covered under patient's Medicare-D plan.  Her formulary pays for Crestor or Vytorin.  Please change med. Thanks Byrd Hesselbach Initial call taken by: Paulo Fruit  BS,CPht II,MPH,  January 06, 2009 4:57 PM  Follow-up for Phone Call        I have changed lipitor to crestor. Rx sent electronically. Follow-up by: Loel Dubonnet MD,  January 06, 2009 7:40 PM    New/Updated Medications: CRESTOR 10 MG TABS (ROSUVASTATIN CALCIUM) 1 tablet by mouth daily   Prescriptions: CRESTOR 10 MG TABS (ROSUVASTATIN CALCIUM) 1 tablet by mouth daily  #90 x 3   Entered and Authorized by:   Loel Dubonnet MD   Signed by:   Loel Dubonnet MD on 01/06/2009   Method used:   Electronically to        [facility]* (retail)       [address]       [place], [place]  [address]       Ph: [phone]       Fax: [phone]   RxID:   [id number]   Appended Document: Pharmacy request change in med Medication electronically sent to wrong pharmacy.  I called [facility] and spoke to [facility] to cancel that prescription and telephoned it to the correct pharmacy--[facility].  I spoke to Onalee Hua, [place] who discontinued the Lipitor that is not covered and read back the alternative drug Crestor 10 mg prescription. The patient was also notified.

## 2010-11-12 NOTE — Progress Notes (Signed)
Summary: med refill/g  Phone Note Refill Request Message from:  Fax from Pharmacy on September 25, 2010 10:44 AM  Refills Requested: Medication #1:  LISINOPRIL 10 MG TABS Take 1 tablet by mouth once a day.   Last Refilled: 08/28/2010 Last appt. 08/10/10.   Method Requested: Electronic Initial call taken by: Chinita Pester RN,  September 25, 2010 10:45 AM    Prescriptions: LISINOPRIL 10 MG TABS (LISINOPRIL) Take 1 tablet by mouth once a day  #30 x 3   Entered and Authorized by:   Darnelle Maffucci MD   Signed by:   Darnelle Maffucci MD on 09/25/2010   Method used:   [facility]        [facility] [phone]* (retail)       [address]       [place], [place]  [address]       Ph: [phone] or [phone]       Fax: [phone]   RxID:   [id number]

## 2010-11-12 NOTE — Progress Notes (Signed)
Summary: Refill/gh  Phone Note Refill Request Message from:  Fax from Pharmacy on September 24, 2010 9:41 AM  Refills Requested: Medication #1:  LISINOPRIL 10 MG TABS Take 1 tablet by mouth once a day.   Last Refilled: 08/28/2010  Method Requested: Electronic Initial call taken by: Angelina Ok RN,  September 24, 2010 9:41 AM

## 2010-12-15 ENCOUNTER — Encounter: Payer: Self-pay | Admitting: Internal Medicine

## 2010-12-27 LAB — DIFFERENTIAL
Lymphs Abs: 1.4 10*3/uL (ref 0.7–4.0)
Monocytes Relative: 4 % (ref 3–12)
Neutro Abs: 4.1 10*3/uL (ref 1.7–7.7)
Neutrophils Relative %: 71 % (ref 43–77)

## 2010-12-27 LAB — URINALYSIS, ROUTINE W REFLEX MICROSCOPIC
Glucose, UA: NEGATIVE mg/dL
Protein, ur: 30 mg/dL — AB
Urobilinogen, UA: 0.2 mg/dL (ref 0.0–1.0)

## 2010-12-27 LAB — CBC
HCT: 37.3 % (ref 36.0–46.0)
Hemoglobin: 12.6 g/dL (ref 12.0–15.0)
MCHC: 34 g/dL (ref 30.0–36.0)
RDW: 14.7 % (ref 11.5–15.5)

## 2010-12-27 LAB — URINE MICROSCOPIC-ADD ON

## 2010-12-27 LAB — COMPREHENSIVE METABOLIC PANEL
BUN: 18 mg/dL (ref 6–23)
Calcium: 9.3 mg/dL (ref 8.4–10.5)
Creatinine, Ser: 0.81 mg/dL (ref 0.4–1.2)
GFR calc non Af Amer: >60 mL/min (ref >60)
Glucose, Bld: 116 mg/dL — ABNORMAL HIGH (ref 70–99)
Total Protein: 8.2 g/dL (ref 6.0–8.3)

## 2011-01-03 LAB — DIFFERENTIAL
Basophils Relative: 0 % (ref 0–1)
Eosinophils Relative: 0 % (ref 0–5)
Monocytes Absolute: 0.3 10*3/uL (ref 0.1–1.0)
Monocytes Relative: 3 % (ref 3–12)
Neutrophils Relative %: 91 % — ABNORMAL HIGH (ref 43–77)

## 2011-01-03 LAB — BASIC METABOLIC PANEL
Chloride: 99 mEq/L (ref 96–112)
Creatinine, Ser: 0.67 mg/dL (ref 0.4–1.2)
GFR calc Af Amer: >60 mL/min (ref >60)
GFR calc non Af Amer: >60 mL/min (ref >60)
Potassium: 3.9 mEq/L (ref 3.5–5.1)

## 2011-01-03 LAB — RAPID STREP SCREEN (MED CTR MEBANE ONLY): Streptococcus, Group A Screen (Direct): POSITIVE — AB

## 2011-01-03 LAB — CBC
MCV: 85.3 fL (ref 78.0–100.0)
Platelets: 167 10*3/uL (ref 150–400)
RBC: 4.44 MIL/uL (ref 3.87–5.11)
WBC: 11 10*3/uL — ABNORMAL HIGH (ref 4.0–10.5)

## 2011-01-03 LAB — URINALYSIS, ROUTINE W REFLEX MICROSCOPIC
Glucose, UA: NEGATIVE mg/dL
Hgb urine dipstick: NEGATIVE
Specific Gravity, Urine: 1.016 (ref 1.005–1.030)
Urobilinogen, UA: 1 mg/dL (ref 0.0–1.0)

## 2011-01-11 ENCOUNTER — Other Ambulatory Visit: Payer: Self-pay | Admitting: *Deleted

## 2011-01-11 ENCOUNTER — Other Ambulatory Visit: Payer: Self-pay | Admitting: Internal Medicine

## 2011-01-11 MED ORDER — LISINOPRIL 10 MG PO TABS: 10.0000 mg | ORAL_TABLET | Freq: Every day | ORAL | Status: DC

## 2011-01-12 ENCOUNTER — Encounter: Payer: Self-pay | Admitting: Internal Medicine

## 2011-01-12 ENCOUNTER — Ambulatory Visit (INDEPENDENT_AMBULATORY_CARE_PROVIDER_SITE_OTHER): Payer: Medicare Other | Admitting: Internal Medicine

## 2011-01-12 DIAGNOSIS — R32 Unspecified urinary incontinence: Secondary | ICD-10-CM

## 2011-01-12 DIAGNOSIS — E785 Hyperlipidemia, unspecified: Secondary | ICD-10-CM

## 2011-01-12 DIAGNOSIS — R195 Other fecal abnormalities: Secondary | ICD-10-CM

## 2011-01-12 DIAGNOSIS — K219 Gastro-esophageal reflux disease without esophagitis: Secondary | ICD-10-CM

## 2011-01-12 DIAGNOSIS — F32A Depression, unspecified: Secondary | ICD-10-CM

## 2011-01-12 DIAGNOSIS — F329 Major depressive disorder, single episode, unspecified: Secondary | ICD-10-CM

## 2011-01-12 DIAGNOSIS — M67919 Unspecified disorder of synovium and tendon, unspecified shoulder: Secondary | ICD-10-CM

## 2011-01-12 DIAGNOSIS — N289 Disorder of kidney and ureter, unspecified: Secondary | ICD-10-CM

## 2011-01-12 DIAGNOSIS — I10 Essential (primary) hypertension: Secondary | ICD-10-CM

## 2011-01-12 DIAGNOSIS — M719 Bursopathy, unspecified: Secondary | ICD-10-CM

## 2011-01-12 DIAGNOSIS — T7840XA Allergy, unspecified, initial encounter: Secondary | ICD-10-CM

## 2011-01-12 DIAGNOSIS — R3915 Urgency of urination: Secondary | ICD-10-CM

## 2011-01-12 DIAGNOSIS — M1990 Unspecified osteoarthritis, unspecified site: Secondary | ICD-10-CM

## 2011-01-12 DIAGNOSIS — IMO0002 Reserved for concepts with insufficient information to code with codable children: Secondary | ICD-10-CM

## 2011-01-12 DIAGNOSIS — M129 Arthropathy, unspecified: Secondary | ICD-10-CM

## 2011-01-12 DIAGNOSIS — M1710 Unilateral primary osteoarthritis, unspecified knee: Secondary | ICD-10-CM

## 2011-01-12 LAB — BASIC METABOLIC PANEL
CO2: 29 mEq/L (ref 19–32)
Chloride: 99 mEq/L (ref 96–112)
Glucose, Bld: 115 mg/dL — ABNORMAL HIGH (ref 70–99)
Potassium: 4.3 mEq/L (ref 3.5–5.3)
Sodium: 138 mEq/L (ref 135–145)

## 2011-01-12 MED ORDER — HYDROCODONE-ACETAMINOPHEN 5-500 MG PO TABS: 1.0000 | ORAL_TABLET | ORAL | Status: DC | PRN

## 2011-01-12 MED ORDER — TRIAMTERENE-HCTZ 37.5-25 MG PO CAPS: 1.0000 | ORAL_CAPSULE | Freq: Every day | ORAL | Status: DC

## 2011-01-12 MED ORDER — OMEPRAZOLE 40 MG PO CPDR: 40.0000 mg | DELAYED_RELEASE_CAPSULE | Freq: Every day | ORAL | Status: DC

## 2011-01-12 MED ORDER — OXYBUTYNIN CHLORIDE 5 MG PO TABS: 5.0000 mg | ORAL_TABLET | Freq: Two times a day (BID) | ORAL | Status: DC

## 2011-01-12 MED ORDER — LORATADINE 10 MG PO TABS: 10.0000 mg | ORAL_TABLET | Freq: Every day | ORAL | Status: DC

## 2011-01-12 MED ORDER — ATORVASTATIN CALCIUM 40 MG PO TABS
40.0000 mg | ORAL_TABLET | Freq: Every day | ORAL | Status: DC
Start: 2011-01-12 — End: 2011-05-16

## 2011-01-12 MED ORDER — CITALOPRAM HYDROBROMIDE 40 MG PO TABS: 40.0000 mg | ORAL_TABLET | Freq: Every day | ORAL | Status: DC

## 2011-01-12 MED ORDER — LISINOPRIL 10 MG PO TABS: 10.0000 mg | ORAL_TABLET | Freq: Every day | ORAL | Status: DC

## 2011-01-12 MED ORDER — DOCUSATE SODIUM 100 MG PO CAPS: 100.0000 mg | ORAL_CAPSULE | Freq: Every day | ORAL | Status: DC

## 2011-01-12 MED ORDER — CYCLOBENZAPRINE HCL 10 MG PO TABS: 10.0000 mg | ORAL_TABLET | Freq: Three times a day (TID) | ORAL | Status: DC | PRN

## 2011-01-12 NOTE — Assessment & Plan Note (Signed)
>>  ASSESSMENT AND PLAN FOR OSTEOARTHRITIS OF RIGHT KNEE WRITTEN ON 01/12/2011  9:21 AM BY Darnelle Maffucci, MD  Stable from prior exam, continue Vicodin and Flexeril as needed

## 2011-01-12 NOTE — Assessment & Plan Note (Addendum)
This problem has been ongoing for approximately 2 years, patient has been doing Kegel exercises more than 6 months daily along with lifestyle modification without any major success, I discussed with the patient other possible treatments, patient would like to try oxybutynin for relief of her symptoms. I will prescribe her a low-dose oxybutynin if the patient has no adverse side effects, and if the drug is beneficial we may titrate up the dose.

## 2011-01-12 NOTE — Assessment & Plan Note (Signed)
Patient has been having this pain for the past several months is likely to be septic bursitis, I have encouraged patient to take nonsteroidal anti-inflammatory medication, and discuss with her the possibility of a steroid injection if she does not get any relief from oral nonsteroidal anti-inflammatories.

## 2011-01-12 NOTE — Assessment & Plan Note (Signed)
Past LDL is 111, the systolic range for the patient continue current dose of Lipitor.

## 2011-01-12 NOTE — Assessment & Plan Note (Signed)
Stable from prior exam, continue Vicodin and Flexeril as needed

## 2011-01-12 NOTE — Progress Notes (Signed)
  Subjective:    Patient ID: Christine [name], female    DOB: 01-08-1936, 75 y.o.   MRN: [id number]  HPI  Patient is a 75 year old female with a past medical history listed below, presented to outpatient clinic for routine followup, medication refills, and health maintenance. The patient's main problems today are her right shoulder pain which has been ongoing for several months, the pain is somewhat stable, does not significantly limit her daily activities, range of motion on her right arm is within acceptable limits. Also the patient complains of right knee pain is from her chronic osteoarthritis I would like a refill on her pain medication. Patient continues of ongoing urge incontinence which has not significantly improved since the patient start performing kegal exercises, and inquires if there is another solution for her incontinence she is sometimes unable to hold her urine in time to get to the bathroom. Patient denies any dysuria.   Review of Systems  All other systems reviewed and are negative.       Objective:   Physical Exam  Constitutional: She is oriented to person, place, and time. She appears well-developed and well-nourished.  HENT:  Head: Normocephalic and atraumatic.  Eyes: EOM are normal. Pupils are equal, round, and reactive to light.  Neck: Normal range of motion.  Cardiovascular: Normal rate, regular rhythm and normal heart sounds.   Pulmonary/Chest: Effort normal and breath sounds normal.  Abdominal: Soft. Bowel sounds are normal. There is no tenderness. There is no rebound.  Musculoskeletal: Normal range of motion. She exhibits tenderness.       Right shoulder tenderness, chronic  Neurological: She is alert and oriented to person, place, and time.          Assessment & Plan:

## 2011-01-17 LAB — DIFFERENTIAL
Basophils Absolute: 0 10*3/uL (ref 0.0–0.1)
Basophils Relative: 0 % (ref 0–1)
Lymphocytes Relative: 31 % (ref 12–46)
Monocytes Absolute: 0.3 10*3/uL (ref 0.1–1.0)
Monocytes Relative: 5 % (ref 3–12)
Neutro Abs: 3.6 10*3/uL (ref 1.7–7.7)
Neutrophils Relative %: 61 % (ref 43–77)

## 2011-01-17 LAB — COMPREHENSIVE METABOLIC PANEL
Albumin: 3.2 g/dL — ABNORMAL LOW (ref 3.5–5.2)
Alkaline Phosphatase: 82 U/L (ref 39–117)
BUN: 13 mg/dL (ref 6–23)
Creatinine, Ser: 0.92 mg/dL (ref 0.4–1.2)
Glucose, Bld: 118 mg/dL — ABNORMAL HIGH (ref 70–99)
Potassium: 3.4 mEq/L — ABNORMAL LOW (ref 3.5–5.1)
Total Protein: 7.1 g/dL (ref 6.0–8.3)

## 2011-01-17 LAB — URINE CULTURE

## 2011-01-17 LAB — URINALYSIS, ROUTINE W REFLEX MICROSCOPIC
Glucose, UA: NEGATIVE mg/dL
Protein, ur: NEGATIVE mg/dL
Specific Gravity, Urine: 1.02 (ref 1.005–1.030)
Urobilinogen, UA: 0.2 mg/dL (ref 0.0–1.0)

## 2011-01-17 LAB — CBC
HCT: 34.7 % — ABNORMAL LOW (ref 36.0–46.0)
Hemoglobin: 11.4 g/dL — ABNORMAL LOW (ref 12.0–15.0)
MCHC: 32.7 g/dL (ref 30.0–36.0)
MCV: 85.9 fL (ref 78.0–100.0)
Platelets: 178 10*3/uL (ref 150–400)
RDW: 15.4 % (ref 11.5–15.5)

## 2011-02-08 ENCOUNTER — Other Ambulatory Visit: Payer: Self-pay | Admitting: Internal Medicine

## 2011-02-23 NOTE — Op Note (Signed)
Christine [name], Christine [name]             ACCOUNT NO.:  [id number]   MEDICAL RECORD NO.:  [id number]          PATIENT TYPE:  AMB   LOCATION:  [facility]                          FACILITY:  [facility]   PHYSICIAN:  Mila Homer. Sherlean Foot, M.D. DATE OF BIRTH:  June 20, 1936   DATE OF PROCEDURE:  07/26/2007  DATE OF DISCHARGE:  07/26/2007                               OPERATIVE REPORT   SURGEON:  Mila Homer. Sherlean Foot, M.D.   ASSISTANT:  None.   ANESTHESIA:  General.   PREOPERATIVE DIAGNOSIS:  Right knee osteoarthritis, medial and lateral  meniscus tears.   POSTOPERATIVE DIAGNOSIS:  Right knee osteoarthritis, medial and lateral  meniscus tears.   PROCEDURE:  Right knee arthroscopy with partial medial meniscectomy,  partial lateral meniscectomy, chondroplasty in three compartments.   INDICATIONS FOR PROCEDURE:  The patient is a 75 year old black female  with mechanical symptoms and MRI evidence of meniscal tearing and  radiographic evidence of osteoarthritis.  Informed consent was obtained.   DESCRIPTION OF PROCEDURE:  The patient laid supine and administered  general anesthesia.  The right leg was prepped and draped in the usual  sterile fashion.  Inferolateral and inferomedial portals were created  with a #11 blade, blunt trocar and cannula.  Diagnostic arthroscopy  revealed chondromalacia, grade 3 and 4, of the patellofemoral joint.  Chondroplasty was performed on both surfaces.  I then went into the  medial compartment. She had areas of grade 3 and small areas of grade 4  chondromalacia as well as a complex posterior horn medial meniscus tear.  Chondroplasty was performed on both surfaces in this compartment, as  well, and then a partial medial meniscectomy was carried out with  straight and upbiting basket forceps and a [employer].  I went  into the notch. The ACL and PCL were normal.  I then went into the  figure-of-four position.  She mostly grade 3 and some small areas of  grade 4  chondromalacia.  Chondroplasty was performed on both surfaces in  the lateral compartment.  I then used the straight basket forceps to  perform a partial lateral meniscectomy and used a [place] shaver to  remove the debris.  I then lavaged the joint and closed with 4-0 nylon  sutures.  I infiltrated with 10 mL of Marcaine morphine mixture in both  portals.  I dressed with Xeroform, dressing sponges, sterile Webril, and  Ace wrap.   COMPLICATIONS:  None.   DRAINS:  None.           ______________________________  Mila Homer. Sherlean Foot, M.D.    SDL/MEDQ  D:  07/26/2007  T:  07/26/2007  Job:  [id number]

## 2011-02-26 NOTE — Group Therapy Note (Signed)
NAMECARIS, Christine [name] NO.:  [id number]   MEDICAL RECORD NO.:  [id number]          PATIENT TYPE:  WOC   LOCATION:  [facility]                   FACILITY:  [facility]   PHYSICIAN:  Argentina Donovan, MD        DATE OF BIRTH:  22-Aug-1936   DATE OF SERVICE:  11/24/2004                                    CLINIC NOTE   CHIEF COMPLAINT:  Sent by primary M.D. for Pap smear.   SUBJECTIVE:  A 75 year old female comes to clinic today because she was  instructed by her primary care doctor, Dr. Margo Aye over at the internal  medicine outpatient clinic, for a Pap smear and breast exam. She mentions  today that she is status post a hysterectomy in the 1970s and is unsure if  she still has her ovaries. She has not had menses for 40 years. She does  state that her last Pap smear was 4 years ago. She denies any vaginal  discharge, dysuria, fever, chills, or abnormal vaginal bleeding. She does  state that she has urinary frequency but this does not interfere with her  lifestyle. She notes that she had not had a mammogram in 4-5 years but did  go ahead and get this additionally today.   PAST MEDICAL HISTORY:  1.  Hypertension.  2.  Hypercholesterolemia.  3.  Allergic rhinitis.  4.  Hysterectomy in 1970 secondary to fibroids.  5.  G9 P7 - all seven children normal spontaneous vaginal deliveries.  6.  No history of abnormal Pap smears or colposcopy.   MEDICATIONS:  1.  Hydrochlorothiazide 25 mg daily.  2.  Unknown blood pressure medicine.  3.  Lipitor.  4.  Zyrtec.   FAMILY HISTORY:  No history of breast cancer, ovarian cancer, or uterine  cancer. Includes colon cancer, high blood pressure, heart attack, and heart  disease in multiple family members. Her mother was the one who had colon  cancer.   PHYSICAL EXAMINATION:  GENERAL:  Obese-appearing female in no apparent  distress.  CARDIOVASCULAR:  Regular rate and rhythm. No murmurs, rubs, or gallops.  BREAST:  No nipple discharge, no  lesions.  PELVIC:  Bimanual exam:  Difficult-to-palpate ovaries secondary to pannus,  no masses felt, noted central scar secondary to past hysterectomy, well  healed.   ASSESSMENT AND PLAN:  General health annual examination:  The patient  recently had mammogram done; results are pending. No Pap smear is required  given the fact that she has had a hysterectomy. Bimanual exam was done to  examine ovaries and no concerning lesions were felt. At this point in time  the patient's urinary frequency is not bothering her to a point where she is  considering surgery or medication. The patient was encouraged to get  frequent exercise such as walking and was encouraged to work hard on diet  modification.   The patient will return in a year for an annual checkup not including Pap  smear, and will as well return earlier as needed.      AB/MEDQ  D:  11/24/2004  T:  11/24/2004  Job:  [id number]   cc:  [facility] Dr. Catalina Pizza

## 2011-02-26 NOTE — Op Note (Signed)
Christine [name], Christine [name]                       ACCOUNT NO.:  [id number]   MEDICAL RECORD NO.:  [id number]                   PATIENT TYPE:  INP   LOCATION:  5707                                 FACILITY:  [facility]   PHYSICIAN:  Jefry H. Pollyann Kennedy, M.D.                DATE OF BIRTH:  01/20/1936   DATE OF PROCEDURE:  08/21/2003  DATE OF DISCHARGE:                                 OPERATIVE REPORT   PREOPERATIVE DIAGNOSIS:  Right thyroid mass.   POSTOPERATIVE DIAGNOSIS:  Right thyroid mass.   PROCEDURE:  Right hemithyroidectomy.   SURGEON:  Jefry H. Pollyann Kennedy, M.D.   ASSISTANT:  Gloris Manchester. Lazarus Salines, M.D.   ANESTHESIA:  General endotracheal anesthesia.   COMPLICATIONS:  None.   ESTIMATED BLOOD LOSS:  30 mL.   FINDINGS:  A large complex mass involving the right lobe of the thyroid and  isthmus, total mass measured approximately 8 or 9 cm in diameter.  Frozen  section evaluation consistent with multinodular goiter.  No other palpable  masses.  The left side contained multiple small nodules.   REFERRING PHYSICIAN:  [facility]   INDICATIONS FOR PROCEDURE:  This is a 75 year old lady with a recent onset  of a right sided thyroid mass.  FNA was benign and thyroid scan revealed a  cold nodule.  The risks, benefits, alternatives, and complications of the  procedure were explained to the patient who seemed to understand and agreed  to the surgery.   PROCEDURE:  The patient was taken to the operating room and placed on the  operating table in supine position.  Following induction of general  endotracheal anesthesia, the patient was prepped and draped in a standard  fashion.  A transverse incision was outlined in the lower cervical skin  crease and electrocautery was used to incise the skin and subcutaneous  tissue through the platysmal layer.  Subplatysmal flaps were developed  superiorly to the thyroid notch and inferiorly to the sternal notch.  The  midline fascia was divided.  A  thyroid retractor was kept in place to  retract the skin flaps.  The midline fascia was divided and the straps were  dissected off the right lobe of the thyroid.  The mass was in two parts, the  smaller lateral and posterior aspect was multinodular with firm masses and a  separate small lymph node.  The mass that was about 0.5 cm that was adjacent  to the right lobe.  This was sent separate for frozen section and was  consistent with a follicular lesion.  The remaining lesion was dissected  forward off the prevertebral fascia.  An inferior parathyroid gland was  identified.  The recurrent nerve was identified and preserved.  The superior  vasculature was ligated between clamps and divided.  The lesion was  delivered from the wound.  The isthmus was divided with electrocautery and  the mass was delivered and  sent for pathologic evaluation.  The wound was  irrigated with saline.  Hemostasis was completed using ties and cautery as  needed.  Valsalva maneuver confirmed adequacy of hemostasis.  The wound was  closed in layers using 3-0 chromic on the midline fascia and the  platysmal layer and running 5-0 nylon on the skin.  The drain was left in  the wound, a flat 10 mm exiting through a separate stab incision and secured  in place with a silk suture.  The patient was awakened from anesthesia,  extubated, and transferred to the recovery room in stable condition.                                               Jefry H. Pollyann Kennedy, M.D.    JHR/MEDQ  D:  08/21/2003  T:  08/21/2003  Job:  [id number]   cc:   [facility]

## 2011-04-12 ENCOUNTER — Encounter: Payer: Medicaid Other | Admitting: Internal Medicine

## 2011-05-11 ENCOUNTER — Other Ambulatory Visit: Payer: Self-pay | Admitting: Internal Medicine

## 2011-05-16 ENCOUNTER — Other Ambulatory Visit: Payer: Self-pay | Admitting: Internal Medicine

## 2011-06-17 ENCOUNTER — Other Ambulatory Visit: Payer: Self-pay | Admitting: Internal Medicine

## 2011-07-21 LAB — POCT HEMOGLOBIN-HEMACUE
Hemoglobin: 13.1
Operator id: 123881

## 2011-07-22 LAB — URINE MICROSCOPIC-ADD ON

## 2011-07-22 LAB — URINALYSIS, ROUTINE W REFLEX MICROSCOPIC
Bilirubin Urine: NEGATIVE
Glucose, UA: NEGATIVE
Protein, ur: 100 — AB
Urobilinogen, UA: 0.2

## 2011-08-20 ENCOUNTER — Encounter (HOSPITAL_COMMUNITY): Payer: Self-pay | Admitting: *Deleted

## 2011-08-20 ENCOUNTER — Emergency Department (HOSPITAL_COMMUNITY): Payer: Medicare Other

## 2011-08-20 ENCOUNTER — Emergency Department (HOSPITAL_COMMUNITY)
Admission: EM | Admit: 2011-08-20 | Discharge: 2011-08-21 | Disposition: A | Discharge status: Home / Self Care | Payer: Medicare Other | Attending: Emergency Medicine | Admitting: Emergency Medicine

## 2011-08-20 DIAGNOSIS — I10 Essential (primary) hypertension: Secondary | ICD-10-CM | POA: Insufficient documentation

## 2011-08-20 DIAGNOSIS — F329 Major depressive disorder, single episode, unspecified: Secondary | ICD-10-CM | POA: Insufficient documentation

## 2011-08-20 DIAGNOSIS — R319 Hematuria, unspecified: Secondary | ICD-10-CM | POA: Insufficient documentation

## 2011-08-20 DIAGNOSIS — R109 Unspecified abdominal pain: Secondary | ICD-10-CM | POA: Insufficient documentation

## 2011-08-20 DIAGNOSIS — F3289 Other specified depressive episodes: Secondary | ICD-10-CM | POA: Insufficient documentation

## 2011-08-20 DIAGNOSIS — R10819 Abdominal tenderness, unspecified site: Secondary | ICD-10-CM | POA: Insufficient documentation

## 2011-08-20 DIAGNOSIS — N12 Tubulo-interstitial nephritis, not specified as acute or chronic: Secondary | ICD-10-CM

## 2011-08-20 DIAGNOSIS — R110 Nausea: Secondary | ICD-10-CM | POA: Insufficient documentation

## 2011-08-20 DIAGNOSIS — R300 Dysuria: Secondary | ICD-10-CM | POA: Insufficient documentation

## 2011-08-20 DIAGNOSIS — E785 Hyperlipidemia, unspecified: Secondary | ICD-10-CM | POA: Insufficient documentation

## 2011-08-20 DIAGNOSIS — Z79899 Other long term (current) drug therapy: Secondary | ICD-10-CM | POA: Insufficient documentation

## 2011-08-20 LAB — POCT I-STAT, CHEM 8
BUN: 27 mg/dL — ABNORMAL HIGH (ref 6–23)
Calcium, Ion: 1.2 mmol/L (ref 1.12–1.32)
HCT: 38 % (ref 36.0–46.0)
Hemoglobin: 12.9 g/dL (ref 12.0–15.0)
Sodium: 139 mEq/L (ref 135–145)
TCO2: 28 mmol/L (ref 0–100)

## 2011-08-20 LAB — URINE MICROSCOPIC-ADD ON

## 2011-08-20 LAB — URINALYSIS, ROUTINE W REFLEX MICROSCOPIC
Glucose, UA: NEGATIVE mg/dL
Protein, ur: 100 mg/dL — AB
Specific Gravity, Urine: 1.021 (ref 1.005–1.030)
Urobilinogen, UA: 0.2 mg/dL (ref 0.0–1.0)

## 2011-08-20 LAB — CBC
MCV: 86 fL (ref 78.0–100.0)
Platelets: 165 10*3/uL (ref 150–400)
RBC: 4.07 MIL/uL (ref 3.87–5.11)
RDW: 13.9 % (ref 11.5–15.5)
WBC: 8.1 10*3/uL (ref 4.0–10.5)

## 2011-08-20 LAB — DIFFERENTIAL
Basophils Absolute: 0 10*3/uL (ref 0.0–0.1)
Lymphocytes Relative: 18 % (ref 12–46)
Lymphs Abs: 1.5 10*3/uL (ref 0.7–4.0)
Neutro Abs: 6.2 10*3/uL (ref 1.7–7.7)

## 2011-08-20 MED ORDER — SODIUM CHLORIDE 0.9 % IV BOLUS (SEPSIS)
500.0000 mL | Freq: Once | INTRAVENOUS | Status: AC
  Administered 2011-08-20: 500 mL via INTRAVENOUS

## 2011-08-20 MED ORDER — ONDANSETRON HCL 4 MG/2ML IJ SOLN
4.0000 mg | Freq: Once | INTRAMUSCULAR | Status: AC
  Administered 2011-08-20: 4 mg via INTRAVENOUS
  Filled 2011-08-20: qty 2

## 2011-08-20 MED ORDER — ONDANSETRON HCL 4 MG PO TABS: 8.0000 mg | ORAL_TABLET | Freq: Two times a day (BID) | ORAL | Status: AC | PRN

## 2011-08-20 MED ORDER — IOHEXOL 300 MG/ML  SOLN
100.0000 mL | Freq: Once | INTRAMUSCULAR | Status: AC | PRN
  Administered 2011-08-20: 100 mL via INTRAVENOUS

## 2011-08-20 MED ORDER — ONDANSETRON HCL 4 MG PO TABS: 8.0000 mg | ORAL_TABLET | Freq: Four times a day (QID) | ORAL | Status: AC

## 2011-08-20 MED ORDER — MORPHINE SULFATE 4 MG/ML IJ SOLN
4.0000 mg | Freq: Once | INTRAMUSCULAR | Status: AC
  Administered 2011-08-20: 4 mg via INTRAVENOUS
  Filled 2011-08-20: qty 1

## 2011-08-20 MED ORDER — LIDOCAINE HCL 1 % IJ SOLN
INTRAMUSCULAR | Status: AC
  Administered 2011-08-20: 20:00:00
  Filled 2011-08-20: qty 20

## 2011-08-20 MED ORDER — CEFUROXIME AXETIL 500 MG PO TABS: 500.0000 mg | ORAL_TABLET | Freq: Two times a day (BID) | ORAL | Status: AC

## 2011-08-20 MED ORDER — DEXTROSE 5 % IV SOLN
1.0000 g | Freq: Once | INTRAVENOUS | Status: AC
  Administered 2011-08-20: 1 g via INTRAVENOUS
  Filled 2011-08-20: qty 10

## 2011-08-20 NOTE — ED Notes (Signed)
Pt st's for the past few hours she has been having to go to the bathroom every few minutes, st's when she does she can only produce a small amount of urine, st's it's bloody.  Also st's it's very painful to go to the bathroom.

## 2011-08-20 NOTE — ED Notes (Signed)
Pt started having pain on urination this afternoon around 2pm.  She has noticed blood on the toilet seat when she finishes.  She c/o pain in the left side of her back, and pubic area.

## 2011-08-20 NOTE — ED Notes (Signed)
Patient stable at this time and being discharged home with family

## 2011-08-20 NOTE — ED Notes (Signed)
Patient stable at this time and abx done.  Patient vitals repeated and patient getting dressed to go home at this time.

## 2011-08-20 NOTE — ED Notes (Signed)
VITALS DONE FOR DR.J.   See chart.

## 2011-08-20 NOTE — ED Notes (Signed)
Called lab to verify they still had urine for day shift to add on urine culture.

## 2011-08-20 NOTE — ED Provider Notes (Signed)
History     CSN: [id number] Arrival date & time: 08/20/2011  5:39 PM   First MD Initiated Contact with Patient 08/20/11 1820      Chief Complaint  Patient presents with  . Hematuria   complains of suprapubic pain radiating to left flank onset this morning pain pill in nature worse with urination no known fever associated symptoms include nausea and hematuria pain is constant no treatment prior to coming here never had similar symptoms before  (Consider location/radiation/quality/duration/timing/severity/associated sxs/prior treatment) HPI  Past Medical History  Diagnosis Date  . Hyperlipemia   . Hypertension   . Seasonal allergies   . Cataract   . Thyroid nodule   . Depression   . Pruritus   . Morbidly obese   . Impaired glucose tolerance      History reviewed. No pertinent past surgical history. Hysterectomy Family History  Problem Relation Age of Onset  . Colon cancer Mother     deceased age 39  . Cancer Mother   . Multiple myeloma Father   . Cancer Father     History  Substance Use Topics  . Smoking status: Former Smoker    Quit date: 12/14/1984  . Smokeless tobacco: Not on file  . Alcohol Use: No    OB History    Grav Para Term Preterm Abortions TAB SAB Ect Mult Living                  Review of Systems  Constitutional: Negative.   HENT: Negative.   Respiratory: Negative.   Cardiovascular: Negative.   Gastrointestinal: Positive for nausea and abdominal pain. Negative for vomiting.  Genitourinary: Positive for dysuria, hematuria and flank pain.  Musculoskeletal: Negative.   Skin: Negative.   Neurological: Negative.   Hematological: Negative.   Psychiatric/Behavioral: Negative.   All other systems reviewed and are negative.    Allergies  Allegra-d  Home Medications   Current Outpatient Rx  Name Route Sig Dispense Refill  . ATORVASTATIN CALCIUM 40 MG PO TABS  TAKE 1 TABLET BY MOUTH AT BEDTIME 30 tablet 3  . CITALOPRAM HYDROBROMIDE 40 MG  PO TABS  TAKE 1 TABLET BY MOUTH EVERY DAY 30 tablet 3  . CYCLOBENZAPRINE HCL 10 MG PO TABS Oral Take 1 tablet (10 mg total) by mouth 3 (three) times daily as needed. 30 tablet 3  . DOCUSATE SODIUM 100 MG PO CAPS Oral Take 1 capsule (100 mg total) by mouth daily. 10 capsule 3  . HYDROCODONE-ACETAMINOPHEN 5-500 MG PO TABS Oral Take 1 tablet by mouth every 4 (four) hours as needed. 30 tablet 0  . LACTASE 3000 UNITS PO TABS  Take 1 tablet before milk products     . LISINOPRIL 10 MG PO TABS  TAKE 1 TABLET BY MOUTH EVERY DAY 30 tablet 3  . LORATADINE 10 MG PO TABS  TAKE 1 TABLET BY MOUTH EVERY DAY 30 tablet 6  . OMEPRAZOLE 20 MG PO CPDR  TAKE 2 CAPSULES BY MOUTH EVERY DAY 60 capsule 3  . OXYBUTYNIN CHLORIDE 5 MG PO TABS  TAKE 1 TABLET BY MOUTH TWICE A DAY 60 tablet 3  . TRIAMTERENE-HCTZ 37.5-25 MG PO TABS  TAKE 1 CAPSULE BY MOUTH DAILY. 30 tablet 3    BP 142/83  Temp(Src) 97.8 F (36.6 C) (Oral)  Resp 18  SpO2 97%  LMP 01/12/1972  Physical Exam  Nursing note and vitals reviewed. Constitutional: She appears well-developed and well-nourished. No distress.  HENT:  Head: Normocephalic and atraumatic.  Eyes: Conjunctivae are normal. Pupils are equal, round, and reactive to light.  Neck: Neck supple. No tracheal deviation present. No thyromegaly present.  Cardiovascular: Normal rate and regular rhythm.   No murmur heard. Pulmonary/Chest: Effort normal and breath sounds normal.  Abdominal: Soft. Bowel sounds are normal. She exhibits no distension. There is tenderness.       Suprapubic tendernes  Genitourinary:       No CVA tenderness  Musculoskeletal: Normal range of motion. She exhibits no edema and no tenderness.  Neurological: She is alert. Coordination normal.  Skin: Skin is warm and dry. No rash noted.  Psychiatric: She has a normal mood and affect.    ED Course  Angiocath insertion Performed by: Doug Sou Authorized by: Doug Sou Consent: Verbal consent  obtained. Consent given by: patient Patient understanding: patient states understanding of the procedure being performed Patient consent: the patient's understanding of the procedure matches consent given Patient identity confirmed: verbally with patient Local anesthesia used: yes Anesthesia: local infiltration Local anesthetic: lidocaine 2% without epinephrine Anesthetic total: 1 ml Patient sedated: no Patient tolerance of procedure: No timeout needed peripherally inserted a left external jugular vein.   (including critical care time)  Labs Reviewed - No data to display No results found.   No diagnosis found.  External jugular peripheral IV access obtained by me as nursing could not establish peripheral IV access 11:40 PM patient feels improved after IV fluids pain medicine ,antiemetic and IV antibiotics MDM  Hospitalization offer the patient. She declines Plan urine for culture  Prescription Ceftin,zofran, ,  Followup [place] outpatient clinic if not improved in a week return if condition worsens for any reason Diagnosis pyelonephritis-left        Doug Sou, MD 08/20/11 2352

## 2011-08-20 NOTE — ED Notes (Signed)
Pt also st's she is having some flank pain

## 2011-08-21 MED ORDER — ONDANSETRON HCL 4 MG PO TABS: 4.0000 mg | ORAL_TABLET | Freq: Four times a day (QID) | ORAL | Status: AC

## 2011-08-21 MED ORDER — HYDROCODONE-ACETAMINOPHEN 5-325 MG PO TABS: 1.0000 | ORAL_TABLET | Freq: Four times a day (QID) | ORAL | Status: AC | PRN

## 2011-08-21 NOTE — ED Notes (Signed)
Patient finished her abx prior to discharge and had no reaction.

## 2011-08-23 LAB — URINE CULTURE

## 2011-08-24 NOTE — ED Notes (Signed)
+   urine Patient treated with Ceftin-sensitive to same-chart appended per protocol MD.

## 2011-08-28 NOTE — ED Notes (Signed)
+   urine culture. Unable to contact patient by phone, both numbers provided are disconnected. Letter sent.  PER REVIEWING MD:  Continue Ceftin as previously prescribed and follow-up with PMD.

## 2011-08-30 ENCOUNTER — Ambulatory Visit (INDEPENDENT_AMBULATORY_CARE_PROVIDER_SITE_OTHER): Payer: Medicare Other | Admitting: Internal Medicine

## 2011-08-30 ENCOUNTER — Encounter: Payer: Self-pay | Admitting: Internal Medicine

## 2011-08-30 VITALS — BP 129/79 | HR 104 | Temp 97.0°F | Wt 305.6 lb

## 2011-08-30 DIAGNOSIS — R3915 Urgency of urination: Secondary | ICD-10-CM

## 2011-08-30 DIAGNOSIS — E785 Hyperlipidemia, unspecified: Secondary | ICD-10-CM

## 2011-08-30 DIAGNOSIS — I10 Essential (primary) hypertension: Secondary | ICD-10-CM

## 2011-08-30 DIAGNOSIS — R300 Dysuria: Secondary | ICD-10-CM

## 2011-08-30 LAB — POCT URINALYSIS DIPSTICK
Glucose, UA: NEGATIVE
Ketones, UA: NEGATIVE
Leukocytes, UA: NEGATIVE
Protein, UA: NEGATIVE

## 2011-08-30 MED ORDER — TRIAMTERENE-HCTZ 37.5-25 MG PO TABS: 1.0000 | ORAL_TABLET | Freq: Every day | ORAL | Status: DC

## 2011-08-30 MED ORDER — ATORVASTATIN CALCIUM 40 MG PO TABS: 40.0000 mg | ORAL_TABLET | Freq: Every day | ORAL | Status: DC

## 2011-08-30 MED ORDER — LISINOPRIL 10 MG PO TABS: 10.0000 mg | ORAL_TABLET | Freq: Every day | ORAL | Status: DC

## 2011-08-30 NOTE — Assessment & Plan Note (Signed)
Patient's urinalysis negative for infection, advise continuing C[place] exercises for urgent incontinence

## 2011-08-30 NOTE — Progress Notes (Signed)
  Subjective:    Patient ID: Christine [name], female    DOB: 31-Jul-1936, 75 y.o.   MRN: [id number]  HPI  Patient is a pleasant 75 year old female, presents to the outpatient clinic for an emergency department followup after she was treated for a urinary tract infection, today repeat urinalysis is negative and patient has completed an antibiotic course. Reports that the pain and dysuria has significantly improved. Patient also would like refills on her medications. Denies any other complaints  Review of Systems  All other systems reviewed and are negative.       Objective:   Physical Exam  Vitals reviewed. Constitutional: She is oriented to person, place, and time. She appears well-developed and well-nourished.  HENT:  Head: Normocephalic and atraumatic.  Eyes: Pupils are equal, round, and reactive to light.  Neck: Normal range of motion. Neck supple. No JVD present. No thyromegaly present.  Cardiovascular: Normal rate, regular rhythm and normal heart sounds.   No murmur heard. Pulmonary/Chest: Effort normal and breath sounds normal. She has no wheezes. She has no rales.  Abdominal: Soft. Bowel sounds are normal.  Musculoskeletal: Normal range of motion. She exhibits no edema.  Neurological: She is alert and oriented to person, place, and time.  Skin: Skin is warm and dry.          Assessment & Plan:

## 2011-08-30 NOTE — Assessment & Plan Note (Signed)
Well controlled on current treatment, No new changes made today, Will continue to monitor.   

## 2011-09-16 ENCOUNTER — Other Ambulatory Visit: Payer: Self-pay | Admitting: Internal Medicine

## 2011-09-24 ENCOUNTER — Telehealth: Payer: Self-pay | Admitting: *Deleted

## 2011-09-24 NOTE — Telephone Encounter (Signed)
Pt calls and states she has cough that started appr 1 month ago and is becoming progressively worse, she is now unable to sleep and is sore from coughing. She is offered an appt today and Monday and declines due to transportation issues, she states she needs to wait for her children to come home from work and that will be around 1730, it is recommended that she go to urg care when she is able to get transportation, she is agreeable

## 2011-09-24 NOTE — Telephone Encounter (Signed)
Agree with plan 

## 2011-10-13 ENCOUNTER — Other Ambulatory Visit: Payer: Self-pay | Admitting: Internal Medicine

## 2011-12-03 ENCOUNTER — Telehealth: Payer: Self-pay | Admitting: *Deleted

## 2011-12-03 NOTE — Telephone Encounter (Signed)
Pt calls c/o itching throat/ears, running nose, nasal drainage; but no fever/pain.  States she takes generic Claritin - 1 tab daily. Wants to know if she should increase  the Claritin to 2 daily or be  prescribed something else? Thanks

## 2011-12-03 NOTE — Telephone Encounter (Signed)
Called number. No answer. Should not take two claritin. If this is allergies - would rec nasal saline spray and make appt to see about nasal steroid spray. If this is more cold like viral sxs - would NOT take benadryl due to age or any anticholinergic OTC cold prep. Would use tylenol for aches & fever, saline nose spray and give it time.

## 2011-12-03 NOTE — Telephone Encounter (Signed)
Pt called and message left with instructions by Dr Rogelia Boga.

## 2012-01-13 ENCOUNTER — Other Ambulatory Visit: Payer: Self-pay | Admitting: Internal Medicine

## 2012-01-13 DIAGNOSIS — E785 Hyperlipidemia, unspecified: Secondary | ICD-10-CM

## 2012-01-13 DIAGNOSIS — I10 Essential (primary) hypertension: Secondary | ICD-10-CM

## 2012-01-13 DIAGNOSIS — F329 Major depressive disorder, single episode, unspecified: Secondary | ICD-10-CM

## 2012-01-13 DIAGNOSIS — F32A Depression, unspecified: Secondary | ICD-10-CM

## 2012-02-04 ENCOUNTER — Encounter: Payer: Self-pay | Admitting: *Deleted

## 2012-02-04 ENCOUNTER — Encounter: Payer: Self-pay | Admitting: Internal Medicine

## 2012-02-04 ENCOUNTER — Ambulatory Visit (INDEPENDENT_AMBULATORY_CARE_PROVIDER_SITE_OTHER): Payer: Medicare Other | Admitting: Internal Medicine

## 2012-02-04 VITALS — BP 118/65 | HR 76 | Temp 96.6°F | Ht 68.0 in | Wt 293.4 lb

## 2012-02-04 DIAGNOSIS — J309 Allergic rhinitis, unspecified: Secondary | ICD-10-CM | POA: Diagnosis not present

## 2012-02-04 DIAGNOSIS — D126 Benign neoplasm of colon, unspecified: Secondary | ICD-10-CM

## 2012-02-04 DIAGNOSIS — Z0000 Encounter for general adult medical examination without abnormal findings: Secondary | ICD-10-CM | POA: Diagnosis not present

## 2012-02-04 DIAGNOSIS — I10 Essential (primary) hypertension: Secondary | ICD-10-CM | POA: Diagnosis not present

## 2012-02-04 DIAGNOSIS — Z1231 Encounter for screening mammogram for malignant neoplasm of breast: Secondary | ICD-10-CM | POA: Diagnosis not present

## 2012-02-04 DIAGNOSIS — K635 Polyp of colon: Secondary | ICD-10-CM

## 2012-02-04 DIAGNOSIS — E785 Hyperlipidemia, unspecified: Secondary | ICD-10-CM

## 2012-02-04 DIAGNOSIS — E049 Nontoxic goiter, unspecified: Secondary | ICD-10-CM | POA: Diagnosis not present

## 2012-02-04 DIAGNOSIS — Z9109 Other allergy status, other than to drugs and biological substances: Secondary | ICD-10-CM

## 2012-02-04 LAB — LIPID PANEL
HDL: 58 mg/dL (ref >39)
LDL Cholesterol: 117 mg/dL — ABNORMAL HIGH (ref 0–99)
Total CHOL/HDL Ratio: 3.3 Ratio
Triglycerides: 82 mg/dL (ref <150)
VLDL: 16 mg/dL (ref 0–40)

## 2012-02-04 MED ORDER — TRIAMTERENE-HCTZ 37.5-25 MG PO TABS: 1.0000 | ORAL_TABLET | Freq: Every day | ORAL | Status: DC

## 2012-02-04 MED ORDER — ATORVASTATIN CALCIUM 40 MG PO TABS: 40.0000 mg | ORAL_TABLET | Freq: Every day | ORAL | Status: DC

## 2012-02-04 MED ORDER — LORATADINE 10 MG PO TABS: ORAL_TABLET | ORAL | Status: DC

## 2012-02-04 MED ORDER — LISINOPRIL 10 MG PO TABS: 10.0000 mg | ORAL_TABLET | Freq: Every day | ORAL | Status: DC

## 2012-02-04 NOTE — Patient Instructions (Signed)
Please, follow up with an eye exam, mammogram and colonoscopy referrals. You had your thyroid and cholesterol levels checked. Please, follow up with [place] in 4-6 months or sooner needed and call with any questions. Happy Spring!!!!
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
## 2012-02-05 LAB — TSH: TSH: 0.974 u[IU]/mL (ref 0.350–4.500)

## 2012-02-11 ENCOUNTER — Other Ambulatory Visit: Payer: Self-pay | Admitting: Internal Medicine

## 2012-02-29 ENCOUNTER — Ambulatory Visit (HOSPITAL_COMMUNITY): Payer: Medicare Other

## 2012-03-17 ENCOUNTER — Ambulatory Visit (HOSPITAL_COMMUNITY): Admission: RE | Admit: 2012-03-17 | Payer: Medicare Other | Source: Ambulatory Visit

## 2012-05-10 ENCOUNTER — Other Ambulatory Visit: Payer: Self-pay | Admitting: Internal Medicine

## 2012-05-10 DIAGNOSIS — F32A Depression, unspecified: Secondary | ICD-10-CM

## 2012-05-10 DIAGNOSIS — I10 Essential (primary) hypertension: Secondary | ICD-10-CM

## 2012-05-10 DIAGNOSIS — E785 Hyperlipidemia, unspecified: Secondary | ICD-10-CM

## 2012-05-10 DIAGNOSIS — F329 Major depressive disorder, single episode, unspecified: Secondary | ICD-10-CM

## 2012-06-13 ENCOUNTER — Other Ambulatory Visit: Payer: Medicare Other

## 2012-06-16 ENCOUNTER — Other Ambulatory Visit (INDEPENDENT_AMBULATORY_CARE_PROVIDER_SITE_OTHER): Payer: Medicare Other

## 2012-06-16 DIAGNOSIS — E785 Hyperlipidemia, unspecified: Secondary | ICD-10-CM | POA: Diagnosis not present

## 2012-06-16 DIAGNOSIS — I10 Essential (primary) hypertension: Secondary | ICD-10-CM

## 2012-06-16 LAB — COMPLETE METABOLIC PANEL WITH GFR
ALT: 10 U/L (ref 0–35)
AST: 14 U/L (ref 0–37)
Alkaline Phosphatase: 82 U/L (ref 39–117)
BUN: 15 mg/dL (ref 6–23)
Creat: 0.79 mg/dL (ref 0.50–1.10)

## 2012-08-10 ENCOUNTER — Ambulatory Visit (INDEPENDENT_AMBULATORY_CARE_PROVIDER_SITE_OTHER): Payer: Medicare Other | Admitting: Internal Medicine

## 2012-08-10 ENCOUNTER — Ambulatory Visit (HOSPITAL_COMMUNITY)
Admission: RE | Admit: 2012-08-10 | Discharge: 2012-08-10 | Disposition: A | Discharge status: Home / Self Care | Payer: Medicare Other | Source: Ambulatory Visit | Attending: Internal Medicine | Admitting: Internal Medicine

## 2012-08-10 ENCOUNTER — Encounter: Payer: Self-pay | Admitting: Internal Medicine

## 2012-08-10 VITALS — BP 114/72 | HR 89 | Temp 96.8°F | Ht 68.4 in | Wt 284.9 lb

## 2012-08-10 DIAGNOSIS — M25512 Pain in left shoulder: Secondary | ICD-10-CM

## 2012-08-10 DIAGNOSIS — W108XXA Fall (on) (from) other stairs and steps, initial encounter: Secondary | ICD-10-CM | POA: Insufficient documentation

## 2012-08-10 DIAGNOSIS — M25559 Pain in unspecified hip: Secondary | ICD-10-CM | POA: Insufficient documentation

## 2012-08-10 DIAGNOSIS — S79919A Unspecified injury of unspecified hip, initial encounter: Secondary | ICD-10-CM | POA: Diagnosis not present

## 2012-08-10 DIAGNOSIS — M25552 Pain in left hip: Secondary | ICD-10-CM

## 2012-08-10 DIAGNOSIS — S4980XA Other specified injuries of shoulder and upper arm, unspecified arm, initial encounter: Secondary | ICD-10-CM | POA: Diagnosis not present

## 2012-08-10 DIAGNOSIS — M25519 Pain in unspecified shoulder: Secondary | ICD-10-CM

## 2012-08-10 MED ORDER — HYDROCODONE-ACETAMINOPHEN 5-500 MG PO TABS: 1.0000 | ORAL_TABLET | Freq: Four times a day (QID) | ORAL | Status: DC | PRN

## 2012-08-10 NOTE — Progress Notes (Signed)
  Subjective:    Patient ID: Christine [name], female    DOB: 12/22/1935, 76 y.o.   MRN: [id number]  HPI States she's in a hurry today because her ride is leaving in 18 minutes.  She presents today due to fall 2 weeks ago.  She states she was going outside and stepped down too quickly, lost her balance, and landed on left side.  She states her nephew helped her up.  She states she was in a little bit of pain but she was able to walk without difficulty.  She states she has noticed left hip pain getting increasingly worse. She has been taking advil without improvement.   She denies any head trauma during this fall. She states her last fall was about 3 years ago. She denies any presyncope or syncope leading to her fall. She states she miscalculated and it was completely mechanical.  Review of Systems Otherwise negative except for that stated in the HPI.    Objective:   Physical Exam Filed Vitals:   08/10/12 1053  BP: 114/72  Pulse: 89  Temp: 96.8 F (36 C)   GEN: AAOx3, NAD. HEENT: no lesions. EOMI, PERRLA. CV: S1S2, no m/r/g, RRR. PULM: CTA bilat. ABD/GI: Soft, NT, +BS, no echymoses.  LE/UE: limited active and passive range of motion left hip, no deformity noted. Limited active and passive range of motion of Left shoulder joint, no deformity noted. Motion limited by pain. NEURO: CN II - XII intact, no focal deficits.     Assessment & Plan:  76 yr. Old female w/ pmhx significant for hyperlipidemia, morbid obesity, OA, depression, impaired glucose tolerance, presents due to fall and left hip/left shoulder pain. 1) s/p fall w/ left hip and left shoulder pain: Xrays of left hip and left shoulder today. She will need to stay in clinic until results reviewed. Vicodin for pain. Educated on sedative effects. Return in 2 weeks for re-eval.  Jonah Blue  Addendum: Xrays of left shoulder and left hip show no evidence of fracture.  Return in two weeks for re-eval or sooner if symptoms worsen.    Jonah Blue

## 2012-08-10 NOTE — Patient Instructions (Addendum)
-  xray of left shoulder and left hip today, stay in clinic until I see these, -return in 2 weeks. -may use vicodin as prescribed for pain, remember side effects we discussed.

## 2012-08-11 ENCOUNTER — Other Ambulatory Visit: Payer: Self-pay | Admitting: Internal Medicine

## 2012-08-11 ENCOUNTER — Telehealth: Payer: Self-pay | Admitting: *Deleted

## 2012-08-11 DIAGNOSIS — M25512 Pain in left shoulder: Secondary | ICD-10-CM

## 2012-08-11 DIAGNOSIS — M25552 Pain in left hip: Secondary | ICD-10-CM

## 2012-08-11 NOTE — Telephone Encounter (Signed)
Yes, thanks

## 2012-08-11 NOTE — Telephone Encounter (Signed)
Pt called and stated the Rx for vicodin only comes in vicodin 5/300 mg tablets.  You had written for vicodin 5/500 mg. I authorized the change to vidocin 5/300 mg tablets. Is this okay with you?  Please change in EPIC.

## 2012-08-24 ENCOUNTER — Ambulatory Visit (INDEPENDENT_AMBULATORY_CARE_PROVIDER_SITE_OTHER): Payer: Medicare Other | Admitting: Internal Medicine

## 2012-08-24 ENCOUNTER — Encounter: Payer: Self-pay | Admitting: Internal Medicine

## 2012-08-24 VITALS — BP 129/68 | HR 81 | Temp 97.0°F | Wt 279.6 lb

## 2012-08-24 DIAGNOSIS — I10 Essential (primary) hypertension: Secondary | ICD-10-CM | POA: Diagnosis not present

## 2012-08-24 DIAGNOSIS — Z9109 Other allergy status, other than to drugs and biological substances: Secondary | ICD-10-CM | POA: Diagnosis not present

## 2012-08-24 DIAGNOSIS — E785 Hyperlipidemia, unspecified: Secondary | ICD-10-CM

## 2012-08-24 DIAGNOSIS — F32A Depression, unspecified: Secondary | ICD-10-CM

## 2012-08-24 DIAGNOSIS — M25559 Pain in unspecified hip: Secondary | ICD-10-CM

## 2012-08-24 DIAGNOSIS — F329 Major depressive disorder, single episode, unspecified: Secondary | ICD-10-CM | POA: Diagnosis not present

## 2012-08-24 DIAGNOSIS — M25552 Pain in left hip: Secondary | ICD-10-CM

## 2012-08-24 DIAGNOSIS — S76019A Strain of muscle, fascia and tendon of unspecified hip, initial encounter: Secondary | ICD-10-CM | POA: Insufficient documentation

## 2012-08-24 DIAGNOSIS — K5900 Constipation, unspecified: Secondary | ICD-10-CM | POA: Diagnosis not present

## 2012-08-24 DIAGNOSIS — R3915 Urgency of urination: Secondary | ICD-10-CM | POA: Diagnosis not present

## 2012-08-24 MED ORDER — HYDROCODONE-ACETAMINOPHEN 5-300 MG PO TABS: 1.0000 | ORAL_TABLET | Freq: Four times a day (QID) | ORAL | Status: DC | PRN

## 2012-08-24 MED ORDER — OXYBUTYNIN CHLORIDE 5 MG PO TABS: 5.0000 mg | ORAL_TABLET | Freq: Three times a day (TID) | ORAL | Status: DC

## 2012-08-24 MED ORDER — LORATADINE 10 MG PO TABS: ORAL_TABLET | ORAL | Status: DC

## 2012-08-24 MED ORDER — DOCUSATE SODIUM 100 MG PO CAPS: 100.0000 mg | ORAL_CAPSULE | Freq: Every day | ORAL | Status: DC | PRN

## 2012-08-24 MED ORDER — LISINOPRIL 20 MG PO TABS: 20.0000 mg | ORAL_TABLET | Freq: Every day | ORAL | Status: DC

## 2012-08-24 MED ORDER — CITALOPRAM HYDROBROMIDE 40 MG PO TABS
40.0000 mg | ORAL_TABLET | Freq: Every day | ORAL | Status: DC
Start: 2012-08-24 — End: 2015-01-30

## 2012-08-24 MED ORDER — ATORVASTATIN CALCIUM 40 MG PO TABS: 40.0000 mg | ORAL_TABLET | Freq: Every day | ORAL | Status: DC

## 2012-08-24 NOTE — Patient Instructions (Addendum)
You will have an MRI of your left hip to further evaluate your pain. Please stop taking triamterene and hydrochlorothiazide by mouth due to urinary incontinence. Please I have increased the dose of lisinopril from 10 mg once daily to 20 mg once daily. Have prescribed for you hydrocodone and acetaminophen [Vicodin] for your hip pain. Please take 1 to 2 tablets every 6 hours for pain  Do not exceed 8 tablets in a day.  You will come to the clinic when the MRI is complete to discuss the results. I have scheduled for you an appointment for home health needs assessment. This will take a few days while being processed. Continue with the rest of the medications as before You will be seen in the clinic within 2 weeks
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
## 2012-08-25 ENCOUNTER — Telehealth: Payer: Self-pay | Admitting: *Deleted

## 2012-08-25 DIAGNOSIS — I10 Essential (primary) hypertension: Secondary | ICD-10-CM | POA: Insufficient documentation

## 2012-08-25 NOTE — Assessment & Plan Note (Addendum)
Ms Blackston continues to experience a lot of pain in her hip since her fall 1 months ago. She had a negative x ray of the hip for fracture. She has been using a low Vicodin without much pain relief. It is still possible that she has a fracture of the hip that was not picked by the plan x ray. We will do MRI of the hip given a higher clinical suspicion. I have increased Vicodin to 1-2 tablets every 4 hours for pain. She will be evaluated for home health needs including physiotherapy. She will return to there clinic in 2 weeks.

## 2012-08-25 NOTE — Assessment & Plan Note (Addendum)
Her BP is well controlled. I am concerned that the the diuretic is decreasing her quality of life especially in the setting of urinary urgency. It is becoming much difficulty for her to go to the bathroom due to pain.   Plan  - D/c Triamterene/HCTZ and increase the dose of lisinopril to 20 mg.  Given her current BP, this might be adequate. If her BP does not get well controlled on 20 mg, we can consider increasing the Lisinopril further or adding a non-diuretic anti-hypertension medication like a CCB. -we will do BMET on next visit to check Cr and K

## 2012-08-25 NOTE — Telephone Encounter (Signed)
Pt called to check the statusof her MRI that was to be scheduled, she is awaiting a phone call

## 2012-08-30 ENCOUNTER — Telehealth: Payer: Self-pay | Admitting: Licensed Clinical Social Worker

## 2012-08-30 NOTE — Telephone Encounter (Signed)
[name] was referred to CSW for home health services.  CSW placed call to pt's home number, line was busy and mobile number was disconnected.  CSW will continue to follow.

## 2012-08-31 NOTE — Telephone Encounter (Signed)
Mri scheduled for 09/05/2012 at 8:45 am. Pt aware.Criss Alvine, Darlene Cassady11/21/201310:16 AM

## 2012-09-01 NOTE — Telephone Encounter (Signed)
CSW placed call to pt to obtain pt's choice of home health agency.  Discussed referral with [name] and provided names of Medicare certified home health agencies that service [facility].  [name] requesting to return call to CSW on 09/04/12 with name of agency pt prefers.  CSW will wait for return call on 11/25.

## 2012-09-05 ENCOUNTER — Telehealth: Payer: Self-pay | Admitting: Internal Medicine

## 2012-09-05 ENCOUNTER — Ambulatory Visit (HOSPITAL_COMMUNITY): Payer: Medicare Other

## 2012-09-05 NOTE — Telephone Encounter (Signed)
[facility] came by the patient's home and per [facility] Representative Derrian and stated that tried to come out and she was very argumentative and refused Home Health and PT services.

## 2012-09-06 NOTE — Progress Notes (Signed)
Christine [name] 09/05/2012 12:43 PM Signed  [facility] came by the patient's home and per [facility] Representative Derrian and stated that tried to come out and she was very argumentative and refused Home Health and PT services.

## 2012-09-08 ENCOUNTER — Ambulatory Visit (HOSPITAL_COMMUNITY)
Admission: RE | Admit: 2012-09-08 | Discharge: 2012-09-08 | Disposition: A | Discharge status: Home / Self Care | Payer: Medicare Other | Source: Ambulatory Visit | Attending: Internal Medicine | Admitting: Internal Medicine

## 2012-09-08 DIAGNOSIS — IMO0002 Reserved for concepts with insufficient information to code with codable children: Secondary | ICD-10-CM | POA: Insufficient documentation

## 2012-09-08 DIAGNOSIS — M25559 Pain in unspecified hip: Secondary | ICD-10-CM | POA: Diagnosis not present

## 2012-09-08 DIAGNOSIS — M25552 Pain in left hip: Secondary | ICD-10-CM

## 2012-09-08 DIAGNOSIS — W19XXXA Unspecified fall, initial encounter: Secondary | ICD-10-CM | POA: Insufficient documentation

## 2012-09-11 ENCOUNTER — Telehealth: Payer: Self-pay | Admitting: *Deleted

## 2012-09-11 NOTE — Telephone Encounter (Signed)
CSW placed call to follow up with Ms. Orban regarding pt's choice of home health agencies.  Female relative answered phone, pt not home at this time.  CSW left message requesting return call. CSW provided contact hours and phone number.

## 2012-09-11 NOTE — Telephone Encounter (Signed)
[facility] called to say pt refuses HH services at this time

## 2012-09-12 NOTE — Telephone Encounter (Signed)
Thanks. Will have to discuss this with her on next appt.

## 2012-09-12 NOTE — Telephone Encounter (Signed)
Letter mailed.  CSW will sign off. 

## 2012-09-12 NOTE — Telephone Encounter (Signed)
Pt called, left message that she needed some help and when was PT and the nurse coming. i called pt back and reminded her that she had refused homecare, she states she was on her medicine and didn't really recall what she told them??? She also ask if [facility] was the only company, i told her no there were others. i have made her an appt per doriss. 11/9 at 1315 dr Saralyn Pilar. i then called the [facility] sandra and she states pt told her that all she needed was someone to do laundry and clean her house. New HH referral will need to be done if that is what is decided

## 2012-09-14 NOTE — Telephone Encounter (Signed)
I have attempted to contact Ms. Krammes regarding HH agencies to make referral.  Provided several choices, pt states she wanted to think about agency and call me back.  Pt never returned call to CSW, CSW followed up to no avail.  CSW mailed pt letter requesting agency preference and provided all Medicare approved HH agencies in [place].  CSW has signed off.  CSW will await for pt to provide agency of choice.

## 2012-09-14 NOTE — Telephone Encounter (Signed)
CSW did not send referral to [facility], referral may have been faxed by nursing staff.

## 2012-09-18 ENCOUNTER — Ambulatory Visit: Payer: Medicare Other | Admitting: Internal Medicine

## 2012-09-20 ENCOUNTER — Telehealth: Payer: Self-pay | Admitting: Licensed Clinical Social Worker

## 2012-09-20 NOTE — Telephone Encounter (Signed)
Pt placed all to CSW informing CSW letter was received.  Pt has listing of Medicare certified home health agencies in [place].  Pt states she will choose an agency and notify CSW.

## 2012-09-21 ENCOUNTER — Telehealth: Payer: Self-pay | Admitting: Licensed Clinical Social Worker

## 2012-09-21 NOTE — Telephone Encounter (Signed)
[facility] start of care will be Monday, 09-25-2012. Faxed referral to [phone]

## 2012-09-21 NOTE — Telephone Encounter (Signed)
Christine [name] placed call to CSW left message informing CSW to make referral to [facility].  CSW placed call to [name] and discussed referral.  Pt states she is currently living with family at: [address] [place], [place].  Phone number to residence is: [phone].  CSW will fax referral to [facility].

## 2012-09-22 ENCOUNTER — Ambulatory Visit (INDEPENDENT_AMBULATORY_CARE_PROVIDER_SITE_OTHER): Payer: Medicare Other | Admitting: Internal Medicine

## 2012-09-22 VITALS — BP 145/74 | HR 86 | Temp 96.6°F | Ht 68.0 in | Wt 280.6 lb

## 2012-09-22 DIAGNOSIS — I10 Essential (primary) hypertension: Secondary | ICD-10-CM | POA: Diagnosis not present

## 2012-09-22 DIAGNOSIS — M25559 Pain in unspecified hip: Secondary | ICD-10-CM

## 2012-09-22 DIAGNOSIS — IMO0002 Reserved for concepts with insufficient information to code with codable children: Secondary | ICD-10-CM | POA: Diagnosis not present

## 2012-09-22 DIAGNOSIS — M25519 Pain in unspecified shoulder: Secondary | ICD-10-CM | POA: Diagnosis not present

## 2012-09-22 DIAGNOSIS — M25552 Pain in left hip: Secondary | ICD-10-CM

## 2012-09-22 DIAGNOSIS — K5900 Constipation, unspecified: Secondary | ICD-10-CM

## 2012-09-22 DIAGNOSIS — S76019A Strain of muscle, fascia and tendon of unspecified hip, initial encounter: Secondary | ICD-10-CM

## 2012-09-22 MED ORDER — HYDROCODONE-ACETAMINOPHEN 5-300 MG PO TABS: 1.0000 | ORAL_TABLET | Freq: Four times a day (QID) | ORAL | Status: DC | PRN

## 2012-09-22 MED ORDER — SENNA-DOCUSATE SODIUM 8.6-50 MG PO TABS: 2.0000 | ORAL_TABLET | Freq: Every day | ORAL | Status: DC

## 2012-09-22 NOTE — Assessment & Plan Note (Signed)
Pertinent Data: BP Readings from Last 3 Encounters:  09/22/12 145/74  08/24/12 129/68  08/10/12 114/72    Basic Metabolic Panel:    Component Value Date/Time   NA 140 06/16/2012 0921   K 4.3 06/16/2012 0921   CL 102 06/16/2012 0921   CO2 28 06/16/2012 0921   BUN 15 06/16/2012 0921   CREATININE 0.79 06/16/2012 0921   CREATININE 0.90 08/20/2011 2024   GLUCOSE 99 06/16/2012 0921   CALCIUM 9.6 06/16/2012 0921    Assessment: Disease Control:   not controlled, however, the patient is very agitated today.  Progress toward goals:   unchanged   Barriers to meeting goals: no barriers identified    The patient was started on lisinopril during the last visit. She was stopped of triamterene hydrochlorothiazide secondary to increased and disruptive frequency of urination.    Patient is compliant most of the time with prescribed medications.   Plan:  continue current medications  Will need repeat basic metabolic panel at next visit in one week  Educational resources provided:    Self management tools provided:

## 2012-09-22 NOTE — Assessment & Plan Note (Addendum)
Pertinent Data:  MRI Left Hip (08/24/2012) - 1. Partially torn distal gluteus minimus tendon, with adjacent low- level edema in the greater trochanter.   Assessment: Patient presents with lateral hip and buttocks pain, with recent MRI showing a gluteus minimus partial tendon tear. During the last visit, she was apparently set up with home health PT, although when they arrived at her home she refused services. She wants refills of her Vicodin today. Exam does not indicate worsening of the pathology. She was advised of the importance of physical therapy for continued improvement of her pathology.  Plan:      Home health physical therapy referral again made today - patient was again emphasized the importance of this continued care.   Orthopedic services provided a cane for the patient.   Refill of Vicodin given today.   Patient given stretching exercises for her hip - advised to proceed cautiously and inform us immediately if she has worsening symptoms.

## 2012-09-22 NOTE — Patient Instructions (Signed)
General Instructions:  Please follow-up at the clinic in 1 weeks, at which time we will reevaluate YOUR HIP PAIN - OR, please follow-up in the clinic sooner if needed.  There have been changes in your medications:  STOP the colace   START Senokot twice daily for constipation (please stop for loose stools)  REFILL of your vicodin has been sent to your pharmacy.    If you have been started on new medication(s), and you develop symptoms concerning for allergic reaction, including, but not limited to, throat closing, tongue swelling, rash, please stop the medication immediately and call the clinic at [phone], and go to the ER.  If you are diabetic, please bring your meter to your next visit.  If symptoms worsen, or new symptoms arise, please call the clinic or go to the ER.  PLEASE BRING ALL OF YOUR MEDICATIONS  IN A BAG TO YOUR NEXT APPOINTMENT   Treatment Goals:  Goals (1 Years of Data) as of 09/22/2012          08/24/12 08/10/12 02/04/12 08/30/11     Blood Pressure    . Blood Pressure < 140/90  129/68 114/72 118/65 129/79      Progress Toward Treatment Goals:    Self Care Goals & Plans:       Care Management & Community Referrals: PHYSICAL THERAPY (HOME HEALTH)       Hip Exercises RANGE OF MOTION (ROM) AND STRETCHING EXERCISES   These exercises may help you when beginning to rehabilitate your injury. Doing them too aggressively can worsen your condition. Complete them slowly and gently. Your symptoms may resolve with or without further involvement from your physician, physical therapist or athletic trainer. While completing these exercises, remember:    Restoring tissue flexibility helps normal motion to return to the joints. This allows healthier, less painful movement and activity.   An effective stretch should be held for at least 30 seconds.   A stretch should never be painful. You should only feel a gentle lengthening or release in the stretched tissue.  If these stretches worsen your symptoms even when done gently, consult your physician, physical therapist or athletic trainer.  STRETCH Hamstrings, Supine   Lie on your back. Loop a belt or towel over the ball of your right / left foot.   Straighten your right / left knee and slowly pull on the belt to raise your leg. Do not allow the right / left knee to bend. Keep your opposite leg flat on the floor.   Raise the leg until you feel a gentle stretch behind your right / left knee or thigh. Hold this position for __________ seconds.  Repeat __________ times. Complete this stretch __________ times per day.   STRETCH - Hip Rotators   Lie on your back on a firm surface. Grasp your right / left knee with your right / left hand and your ankle with your opposite hand.   Keeping your hips and shoulders firmly planted, gently pull your right / left knee and rotate your lower leg toward your opposite shoulder until you feel a stretch in your buttocks.   Hold this stretch for __________ seconds.  Repeat this stretch __________ times. Complete this stretch __________ times per day. STRETCH - Hamstrings/Adductors, V-Sit   Sit on the floor with your legs extended in a large "V," keeping your knees straight.   With your head and chest upright, bend at your waist reaching for your right foot to stretch your left adductors.  You should feel a stretch in your left inner thigh. Hold for __________ seconds.   Return to the upright position to relax your leg muscles.   Continuing to keep your chest upright, bend straight forward at your waist to stretch your hamstrings.   You should feel a stretch behind both of your thighs and/or knees. Hold for __________ seconds.   Return to the upright position to relax your leg muscles.   Repeat steps 2 through 4 for opposite leg.  Repeat __________ times. Complete this exercise __________ times per day.   STRETCHING - Hip Flexors, Lunge  Half kneel with your  right / left knee on the floor and your opposite knee bent and directly over your ankle.   Keep good posture with your head over your shoulders. Tighten your buttocks to point your tailbone downward; this will prevent your back from arching too much.   You should feel a gentle stretch in the front of your thigh and/or hip. If you do not feel any resistance, slightly slide your opposite foot forward and then slowly lunge forward so your knee once again lines up over your ankle. Be sure your tailbone remains pointed downward.   Hold this stretch for __________ seconds.  Repeat __________ times. Complete this stretch __________ times per day. STRENGTHENING EXERCISES These exercises may help you when beginning to rehabilitate your injury. They may resolve your symptoms with or without further involvement from your physician, physical therapist or athletic trainer. While completing these exercises, remember:    Muscles can gain both the endurance and the strength needed for everyday activities through controlled exercises.   Complete these exercises as instructed by your physician, physical therapist or athletic trainer. Progress the resistance and repetitions only as guided.   You may experience muscle soreness or fatigue, but the pain or discomfort you are trying to eliminate should never worsen during these exercises. If this pain does worsen, stop and make certain you are following the directions exactly. If the pain is still present after adjustments, discontinue the exercise until you can discuss the trouble with your clinician.  STRENGTH - Hip Extensors, Bridge   Lie on your back on a firm surface. Bend your knees and place your feet flat on the floor.   Tighten your buttocks muscles and lift your bottom off the floor until your trunk is level with your thighs. You should feel the muscles in your buttocks and back of your thighs working. If you do not feel these muscles, slide your feet 1-2  inches further away from your buttocks.   Hold this position for __________ seconds.   Slowly lower your hips to the starting position and allow your buttock muscles relax completely before beginning the next repetition.   If this exercise is too easy, you may cross your arms over your chest.  Repeat __________ times. Complete this exercise __________ times per day.   STRENGTH - Hip Abductors, Straight Leg Raises  Be aware of your form throughout the entire exercise so that you exercise the correct muscles. Sloppy form means that you are not strengthening the correct muscles.  Lie on your side so that your head, shoulders, knee and hip line up. You may bend your lower knee to help maintain your balance. Your right / left leg should be on top.   Roll your hips slightly forward, so that your hips are stacked directly over each other and your right / left knee is facing forward.   Lift your  top leg up 4-6 inches, leading with your heel. Be sure that your foot does not drift forward or that your knee does not roll toward the ceiling.   Hold this position for __________ seconds. You should feel the muscles in your outer hip lifting (you may not notice this until your leg begins to tire).   Slowly lower your leg to the starting position. Allow the muscles to fully relax before beginning the next repetition.  Repeat __________ times. Complete this exercise __________ times per day.   STRENGTH - Hip Adductors, Straight Leg Raises   Lie on your side so that your head, shoulders, knee and hip line up. You may place your upper foot in front to help maintain your balance. Your right / left leg should be on the bottom.   Roll your hips slightly forward, so that your hips are stacked directly over each other and your right / left knee is facing forward.   Tense the muscles in your inner thigh and lift your bottom leg 4-6 inches. Hold this position for __________ seconds.   Slowly lower your leg to the  starting position. Allow the muscles to fully relax before beginning the next repetition.  Repeat __________ times. Complete this exercise __________ times per day.   STRENGTH - Quadriceps, Straight Leg Raises  Quality counts! Watch for signs that the quadriceps muscle is working to insure you are strengthening the correct muscles and not "cheating" by substituting with healthier muscles.  Lay on your back with your right / left leg extended and your opposite knee bent.   Tense the muscles in the front of your right / left thigh. You should see either your knee cap slide up or increased dimpling just above the knee. Your thigh may even quiver.   Tighten these muscles even more and raise your leg 4 to 6 inches off the floor. Hold for right / left seconds.   Keeping these muscles tense, lower your leg.   Relax the muscles slowly and completely in between each repetition.  Repeat __________ times. Complete this exercise __________ times per day.   STRENGTH - Hip Abductors, Standing  Tie one end of a rubber exercise band/tubing to a secure surface (table, pole) and tie a loop at the other end.   Place the loop around your right / left ankle. Keeping your ankle with the band directly opposite of the secured end, step away until there is tension in the tube/band.   Hold onto a chair as needed for balance.   Keeping your back upright, your shoulders over your hips, and your toes pointing forward, lift your right / left leg out to your side. Be sure to lift your leg with your hip muscles. Do not "throw" your leg or tip your body to lift your leg.   Slowly and with control, return to the starting position.  Repeat exercise __________ times. Complete this exercise __________ times per day.   STRENGTH  Quadriceps, Squats  Stand in a door frame so that your feet and knees are in line with the frame.   Use your hands for balance, not support, on the frame.   Slowly lower your weight, bending at  the hips and knees. Keep your lower legs upright so that they are parallel with the door frame. Squat only within the range that does not increase your knee pain. Never let your hips drop below your knees.   Slowly return upright, pushing with your legs, not pulling  with your hands.  Document Released: 10/15/2005 Document Revised: 12/20/2011 Document Reviewed: 01/09/2009 [facility] Patient Information 2013 [place], [place].

## 2012-09-22 NOTE — Assessment & Plan Note (Signed)
Assessment: Persisting constipation in the setting of narcotic usage. She is currently on once daily Colace without stabilization of symptoms. Of note, the patient does continue to pass gas.  Plan:      Stop Colace.  Start Senna-S twice daily and then when necessary.

## 2012-09-22 NOTE — Progress Notes (Signed)
Patient: Christine [name]   MRN: [id number]  DOB: 05/28/1936  PCP: Jonah Blue, DO   Subjective:    HPI: [name] is a 76 y.o. female with a PMHx of hypertension, hyperlipidemia, depression, morbid obesity (Body mass index is 42.66 kg/(m^2).) who presented to clinic today for the following:  1) Left gluteal minimus tendon tear - patient was seen in clinic in November 2013 for 2 week history of left hip pain sustained after a mechanical fall. X-ray was negative for acute fractures. However, an MRI was ordered at that time, and confirmed left gluteal minimus partial tendon tear. She was prescribed Vicodin for the pain, which she continues to require about 3 times a day. He states that the pain continues to be dull originating in the left buttock and sometimes shooting down her left thigh. It is increased by lying on her left side. She has been very fearful of ambulating and performing activities secondary to concern for fall and ongoing pain. She has been recently using a cane, however it had broken therefore does not have one at this time. She denies recurrent falls since that one described at the last visit. She has not had home health physical therapy yet to evaluate the patient. Apparently per note from the R.N. during the last attempt to set up home health physical therapy for the patient, and they came to her home, however the patient became very argumentative and refused services. She requests refills of her Vicodin today.  2) HTN - Patient does not check blood pressure regularly at home. Currently taking Lisinopril 20mg  daily. She was discontinued of triamterene-hydrochlorothiazide during her last visit, secondary to incontinence. denies headaches, dizziness, lightheadedness, chest pain, shortness of breath.  does not request refills today.    Review of Systems: Per HPI.   Current Outpatient Medications: Medication Sig  . atorvastatin (LIPITOR) 40 MG tablet Take 1 tablet (40  mg total) by mouth daily.  . citalopram (CELEXA) 40 MG tablet Take 1 tablet (40 mg total) by mouth daily.  . Hydrocodone-Acetaminophen 5-300 MG TABS Take 1-2 tablets by mouth every 6 (six) hours as needed.  . lactase (LACTAID) 3000 UNIT tablet Take 1 tablet before milk products   . lisinopril (PRINIVIL,ZESTRIL) 20 MG tablet Take 1 tablet (20 mg total) by mouth daily.  [name] loratadine (CLARITIN) 10 MG tablet Take one tablet by mouth twice a day as needed for itching  . oxybutynin (DITROPAN) 5 MG tablet Take 1 tablet (5 mg total) by mouth 3 (three) times daily.  .  Colace 100 mg  Take 1 tablet by mouth daily for obstipation      Allergies  Allergen Reactions  . Influenza Vaccines Hives  . Fexofenadine-Pseudoephed Er Itching and Rash    Causes itching and rash    Past Medical History  Diagnosis Date  . Hyperlipemia   . Hypertension   . Seasonal allergies   . Cataract   . Thyroid nodule   . Depression   . Pruritus   . Morbidly obese   . Impaired glucose tolerance     No past surgical history on file.   Objective:    Physical Exam: Filed Vitals:   09/22/12 1109  BP: 145/74  Pulse: 86  Temp: 96.6 F (35.9 C)     General: Vital signs reviewed and noted. Well-developed, well-nourished, agitated and will only allow for minimal examination (she is annoyed that she was told that she must come on time for her appointments,  and that she's concerned everyone is in a rush because she is late) alert, appropriate and noncooperative throughout examination.  Head: Normocephalic, atraumatic.  Lungs:  Normal respiratory effort. Clear to auscultation BL without crackles or wheezes.  Heart: RRR. S1 and S2 normal without gallop, rubs. No murmur.  Abdomen:  BS normoactive. Soft, Nondistended, non-tender.  No masses or organomegaly.  Extremities: No pretibial edema. Left hip - tenderness to palpation over gluteal area, lateral left hip, left side. She will not allow for full range of motion  testing     Assessment/ Plan:   Case and plan of care discussed with attending physician, Dr. Jonah Blue.

## 2012-09-23 ENCOUNTER — Other Ambulatory Visit: Payer: Self-pay | Admitting: Internal Medicine

## 2012-09-23 DIAGNOSIS — I10 Essential (primary) hypertension: Secondary | ICD-10-CM

## 2012-09-25 MED ORDER — LISINOPRIL 20 MG PO TABS: 20.0000 mg | ORAL_TABLET | Freq: Every day | ORAL | Status: DC

## 2012-10-02 ENCOUNTER — Telehealth: Payer: Self-pay | Admitting: *Deleted

## 2012-10-02 DIAGNOSIS — R269 Unspecified abnormalities of gait and mobility: Secondary | ICD-10-CM | POA: Diagnosis not present

## 2012-10-02 DIAGNOSIS — IMO0002 Reserved for concepts with insufficient information to code with codable children: Secondary | ICD-10-CM | POA: Diagnosis not present

## 2012-10-02 DIAGNOSIS — F329 Major depressive disorder, single episode, unspecified: Secondary | ICD-10-CM | POA: Diagnosis not present

## 2012-10-02 DIAGNOSIS — M6281 Muscle weakness (generalized): Secondary | ICD-10-CM | POA: Diagnosis not present

## 2012-10-02 DIAGNOSIS — S76019A Strain of muscle, fascia and tendon of unspecified hip, initial encounter: Secondary | ICD-10-CM

## 2012-10-02 DIAGNOSIS — Z5189 Encounter for other specified aftercare: Secondary | ICD-10-CM | POA: Diagnosis not present

## 2012-10-02 NOTE — Telephone Encounter (Signed)
Call from [facility] Physical therapist with [facility] [phone] She did a start of care and is asking for visit 2 times a week for 4 weeks. Also need Rx for rolling alker, and bedside commode and also wants OT to seen pt. Fax order to [phone] [facility] )  Last visit 12/13

## 2012-10-02 NOTE — Telephone Encounter (Signed)
Orders placed. Please let me know if this is what they were looking for.  Thank you! - Johnette Abraham, D.O., 10/02/2012, 12:12 PM

## 2012-10-09 ENCOUNTER — Telehealth: Payer: Self-pay | Admitting: Licensed Clinical Social Worker

## 2012-10-09 NOTE — Telephone Encounter (Signed)
Pt returned call to CSW.  CSW informed Ms. Membreno that CSW provided medicaid insurance number to [facility] and medicaid will cover copay amount.  Pt aware and Ms Mcclaran to contact [facility] regarding picking items up or having delivered.

## 2012-10-09 NOTE — Telephone Encounter (Signed)
[name] left voice mail with CSW stating copay for DME is $31.  Pt states [facility] checked cost for toilet seat and walker, pt needs assistance with copay.  CSW placed call to [facility].  [facility] did not have pt's [employer] on chart, CSW provided medicaid number.  Pt's items covered at 100%.  CSW placed call to [name]'s home number, message left with family member requesting return call.

## 2012-10-13 DIAGNOSIS — M6281 Muscle weakness (generalized): Secondary | ICD-10-CM | POA: Diagnosis not present

## 2012-10-13 DIAGNOSIS — Z5189 Encounter for other specified aftercare: Secondary | ICD-10-CM | POA: Diagnosis not present

## 2012-10-13 DIAGNOSIS — R269 Unspecified abnormalities of gait and mobility: Secondary | ICD-10-CM | POA: Diagnosis not present

## 2012-10-13 DIAGNOSIS — IMO0002 Reserved for concepts with insufficient information to code with codable children: Secondary | ICD-10-CM | POA: Diagnosis not present

## 2012-10-13 DIAGNOSIS — F329 Major depressive disorder, single episode, unspecified: Secondary | ICD-10-CM | POA: Diagnosis not present

## 2012-10-16 DIAGNOSIS — Z5189 Encounter for other specified aftercare: Secondary | ICD-10-CM | POA: Diagnosis not present

## 2012-10-16 DIAGNOSIS — F329 Major depressive disorder, single episode, unspecified: Secondary | ICD-10-CM | POA: Diagnosis not present

## 2012-10-16 DIAGNOSIS — M6281 Muscle weakness (generalized): Secondary | ICD-10-CM | POA: Diagnosis not present

## 2012-10-16 DIAGNOSIS — IMO0002 Reserved for concepts with insufficient information to code with codable children: Secondary | ICD-10-CM | POA: Diagnosis not present

## 2012-10-16 DIAGNOSIS — R269 Unspecified abnormalities of gait and mobility: Secondary | ICD-10-CM | POA: Diagnosis not present

## 2012-10-20 DIAGNOSIS — F329 Major depressive disorder, single episode, unspecified: Secondary | ICD-10-CM | POA: Diagnosis not present

## 2012-10-20 DIAGNOSIS — M6281 Muscle weakness (generalized): Secondary | ICD-10-CM | POA: Diagnosis not present

## 2012-10-20 DIAGNOSIS — Z5189 Encounter for other specified aftercare: Secondary | ICD-10-CM | POA: Diagnosis not present

## 2012-10-20 DIAGNOSIS — R269 Unspecified abnormalities of gait and mobility: Secondary | ICD-10-CM | POA: Diagnosis not present

## 2012-10-20 DIAGNOSIS — IMO0002 Reserved for concepts with insufficient information to code with codable children: Secondary | ICD-10-CM | POA: Diagnosis not present

## 2012-10-23 DIAGNOSIS — M6281 Muscle weakness (generalized): Secondary | ICD-10-CM | POA: Diagnosis not present

## 2012-10-23 DIAGNOSIS — Z5189 Encounter for other specified aftercare: Secondary | ICD-10-CM | POA: Diagnosis not present

## 2012-10-23 DIAGNOSIS — IMO0002 Reserved for concepts with insufficient information to code with codable children: Secondary | ICD-10-CM | POA: Diagnosis not present

## 2012-10-23 DIAGNOSIS — F329 Major depressive disorder, single episode, unspecified: Secondary | ICD-10-CM | POA: Diagnosis not present

## 2012-10-23 DIAGNOSIS — R269 Unspecified abnormalities of gait and mobility: Secondary | ICD-10-CM | POA: Diagnosis not present

## 2012-10-26 ENCOUNTER — Encounter: Payer: Medicare Other | Admitting: Internal Medicine

## 2012-10-27 DIAGNOSIS — F329 Major depressive disorder, single episode, unspecified: Secondary | ICD-10-CM | POA: Diagnosis not present

## 2012-10-27 DIAGNOSIS — Z5189 Encounter for other specified aftercare: Secondary | ICD-10-CM | POA: Diagnosis not present

## 2012-10-27 DIAGNOSIS — M6281 Muscle weakness (generalized): Secondary | ICD-10-CM | POA: Diagnosis not present

## 2012-10-27 DIAGNOSIS — IMO0002 Reserved for concepts with insufficient information to code with codable children: Secondary | ICD-10-CM | POA: Diagnosis not present

## 2012-10-27 DIAGNOSIS — R269 Unspecified abnormalities of gait and mobility: Secondary | ICD-10-CM | POA: Diagnosis not present

## 2012-11-06 IMAGING — CR DG SHOULDER 2+V*L*
3 series · 3 of 3 positions shown · non-contrast
Comparison: 11/26/2009

CLINICAL DATA: Fall, left shoulder pain

LEFT SHOULDER - 2+ VIEW

[w shoulder ap internal left]
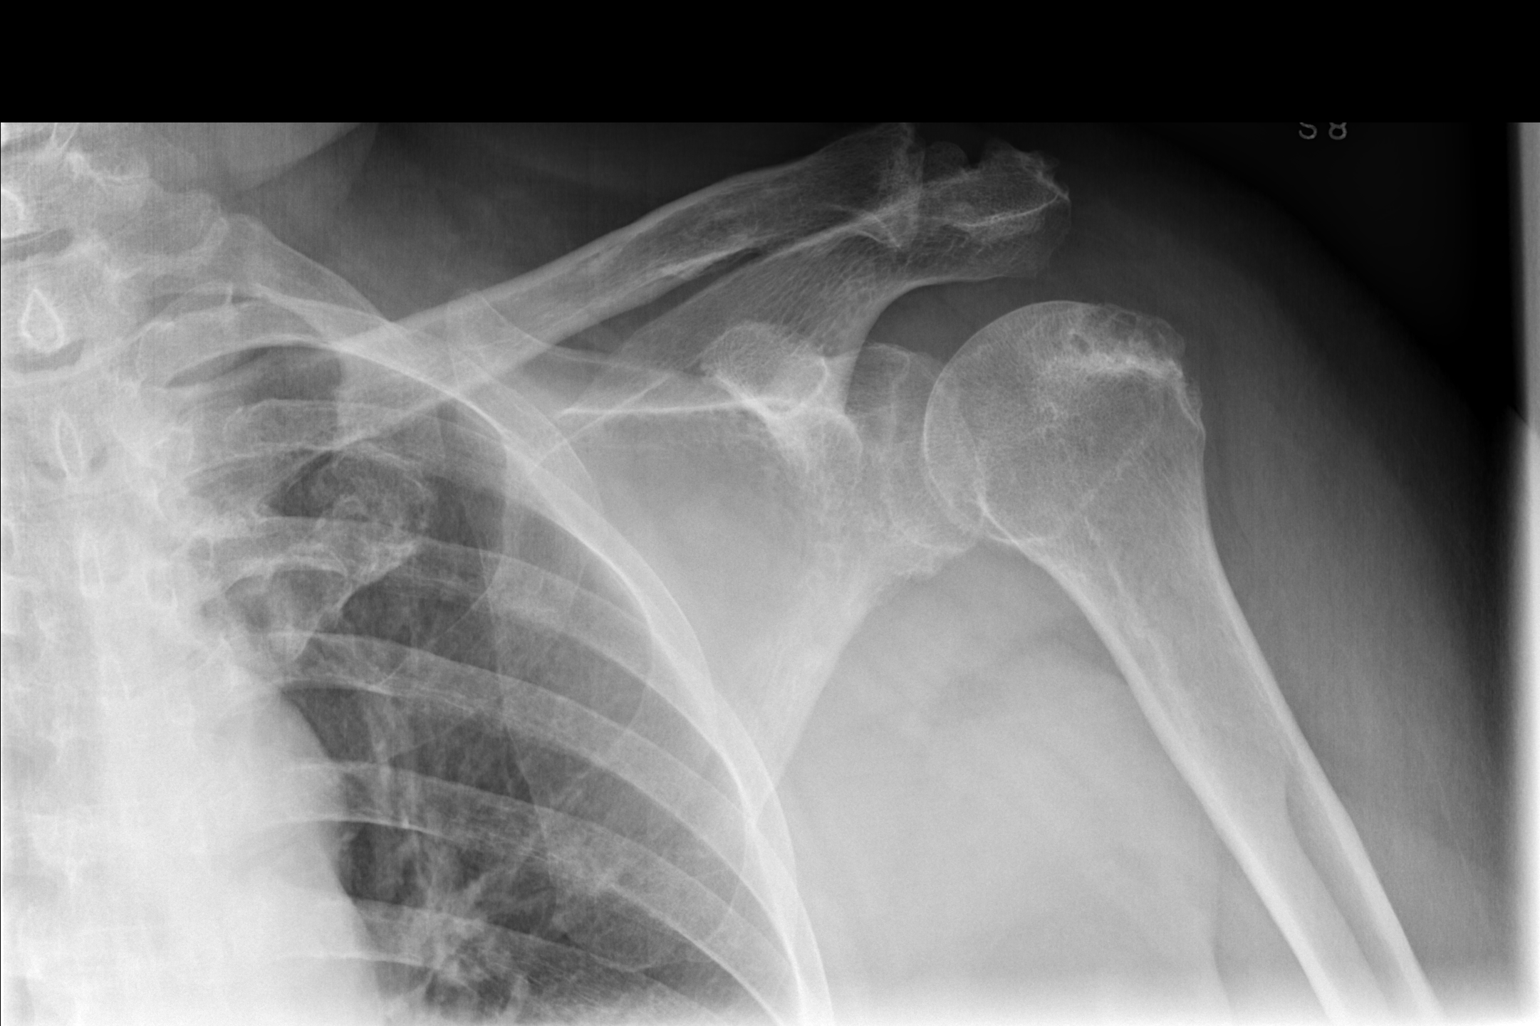

[w shoulder ap external left]
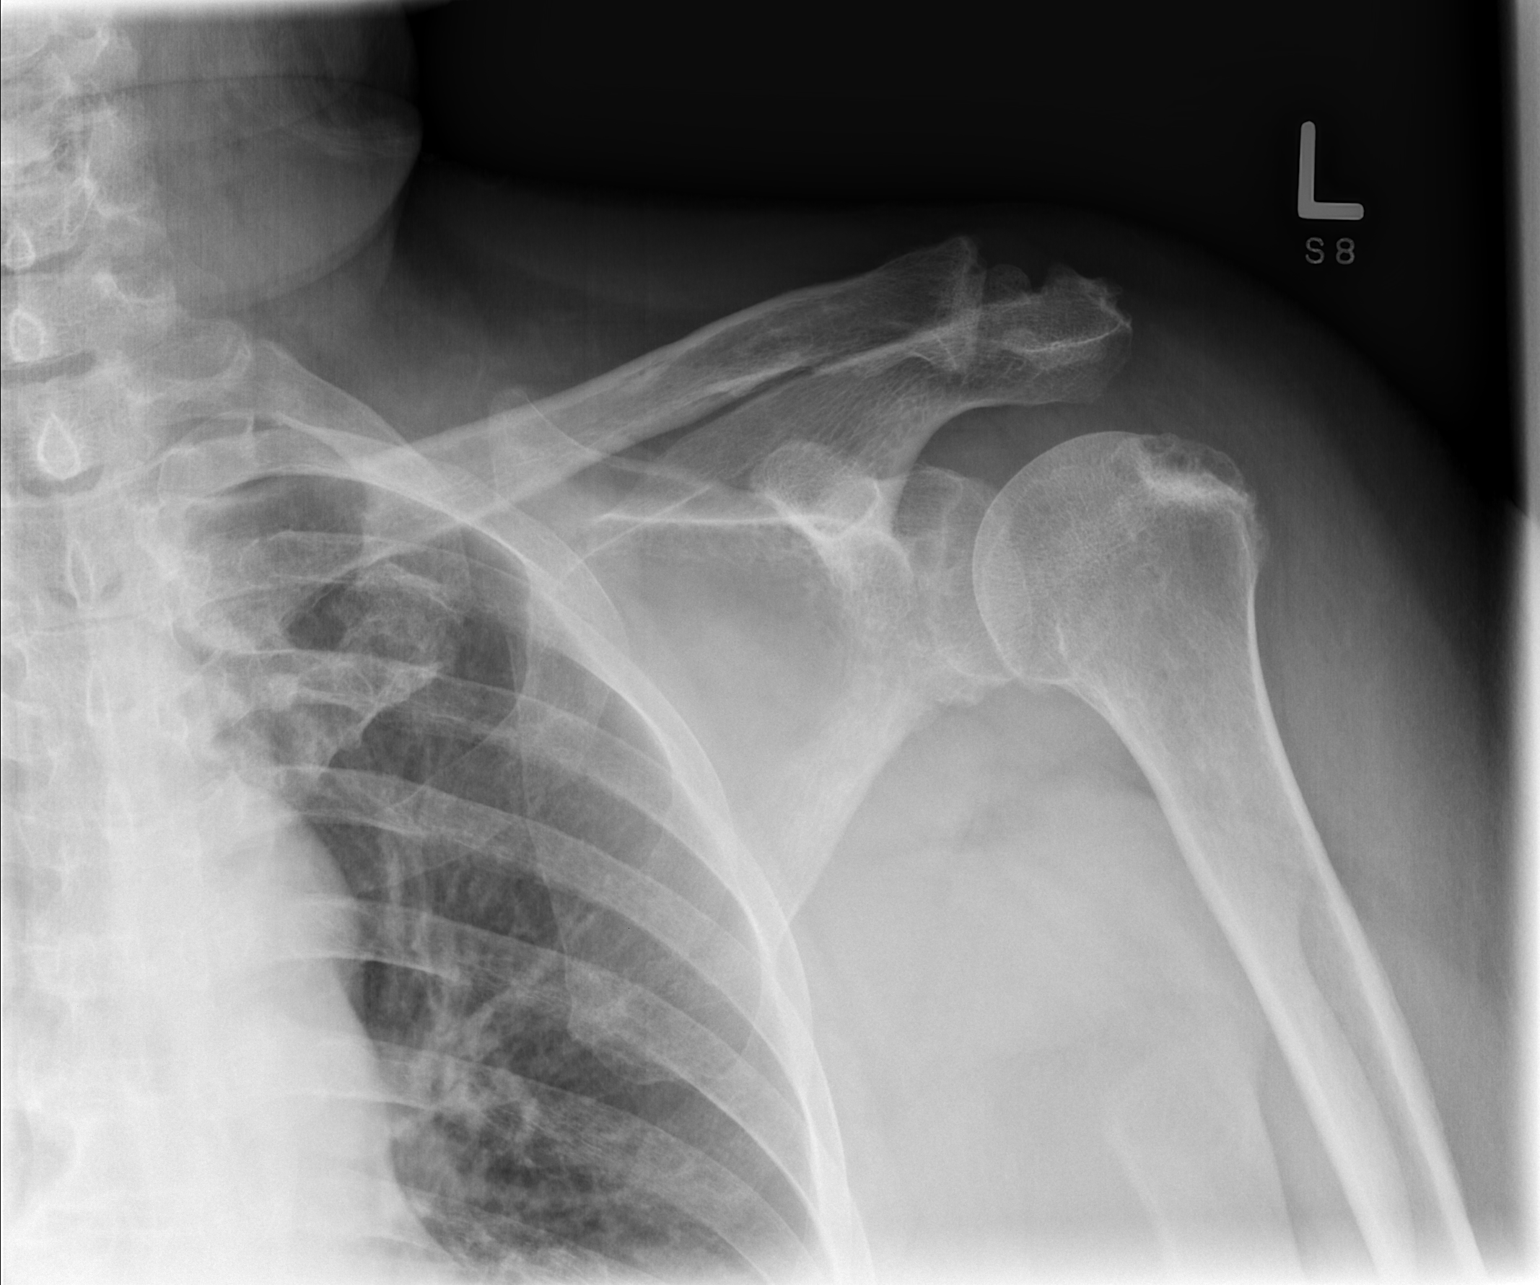

[w shoulder y view left *]
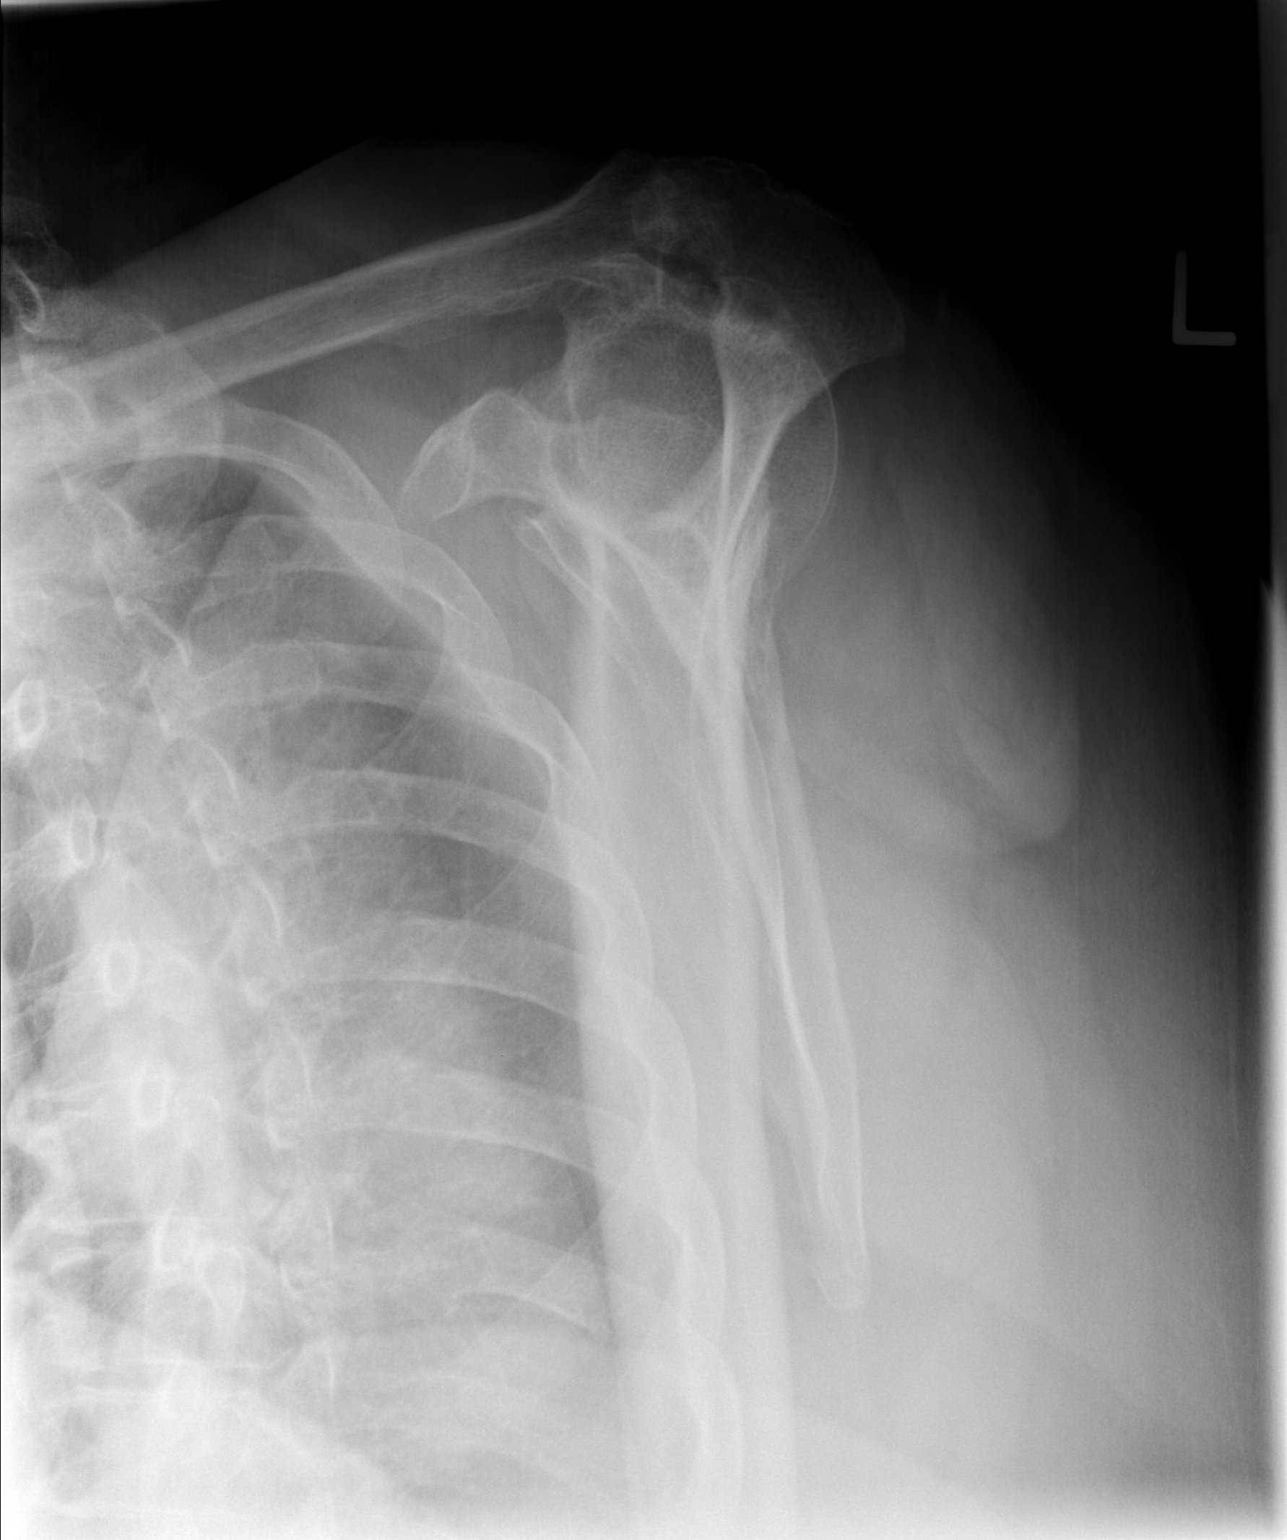

[3 of 3 positions shown; findings below may reference images not displayed]

FINDINGS: No fracture or dislocation is seen.

Stable degenerative changes involving the acromioclavicular joint
and posterolateral humeral head.

Visualized left lung is clear.
IMPRESSION: No fracture or dislocation is seen.

## 2012-11-06 IMAGING — CR DG HIP (WITH OR WITHOUT PELVIS) 2-3V*L*
3 series · 3 of 3 positions shown · non-contrast
Comparison: None.

CLINICAL DATA: Fall, posterior/lateral hip pain

LEFT HIP - COMPLETE 2+ VIEW

[t pelvis a.p.]
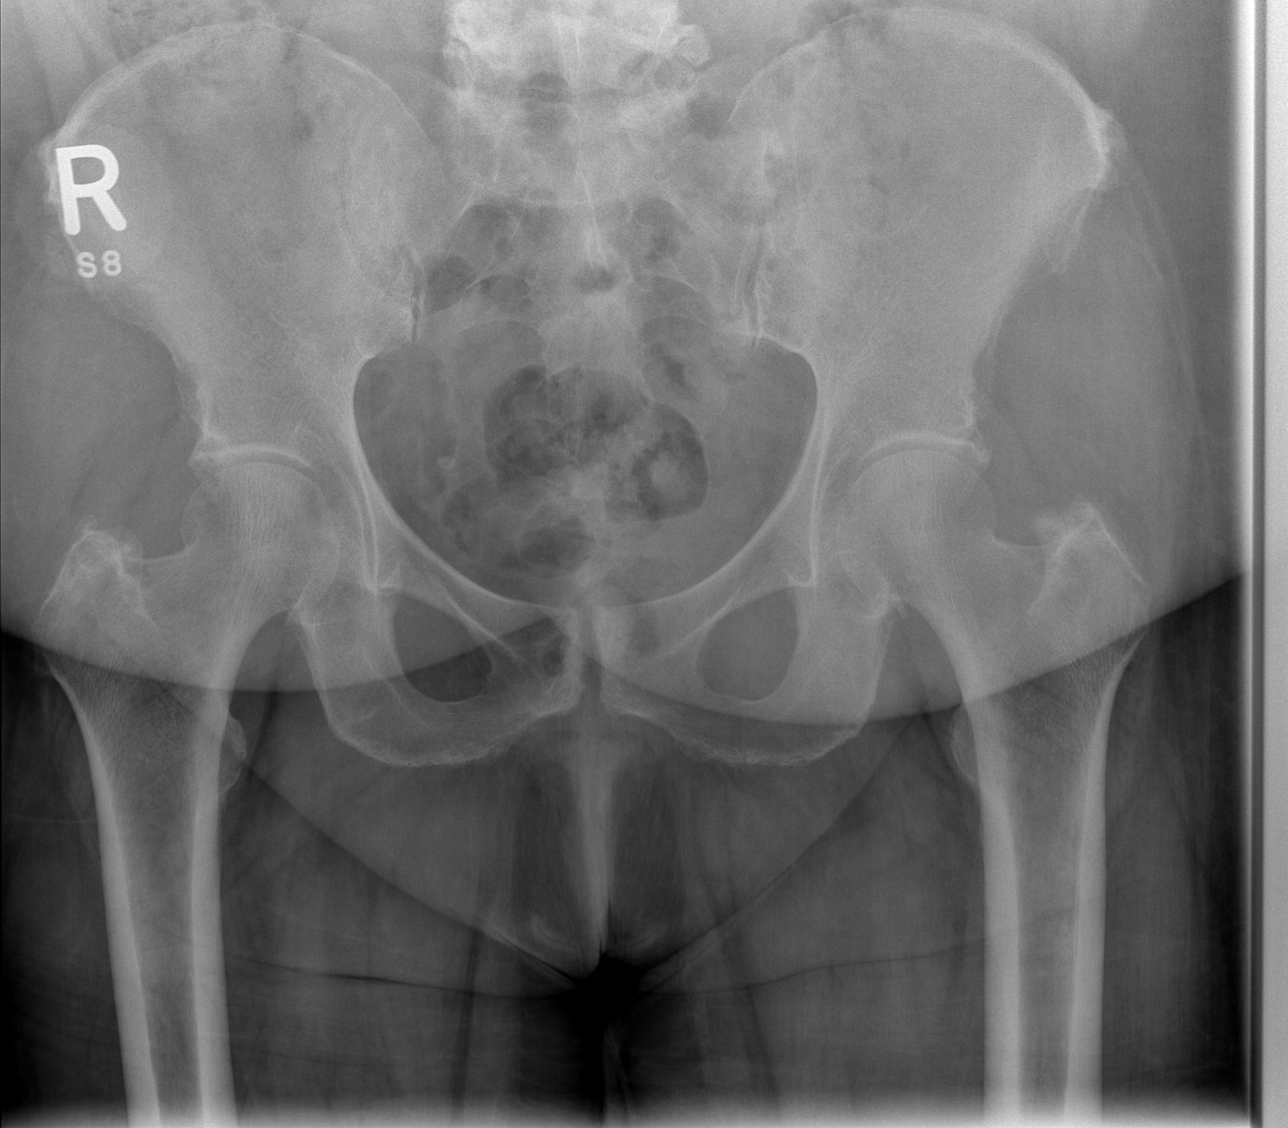

[t hip ap left *]
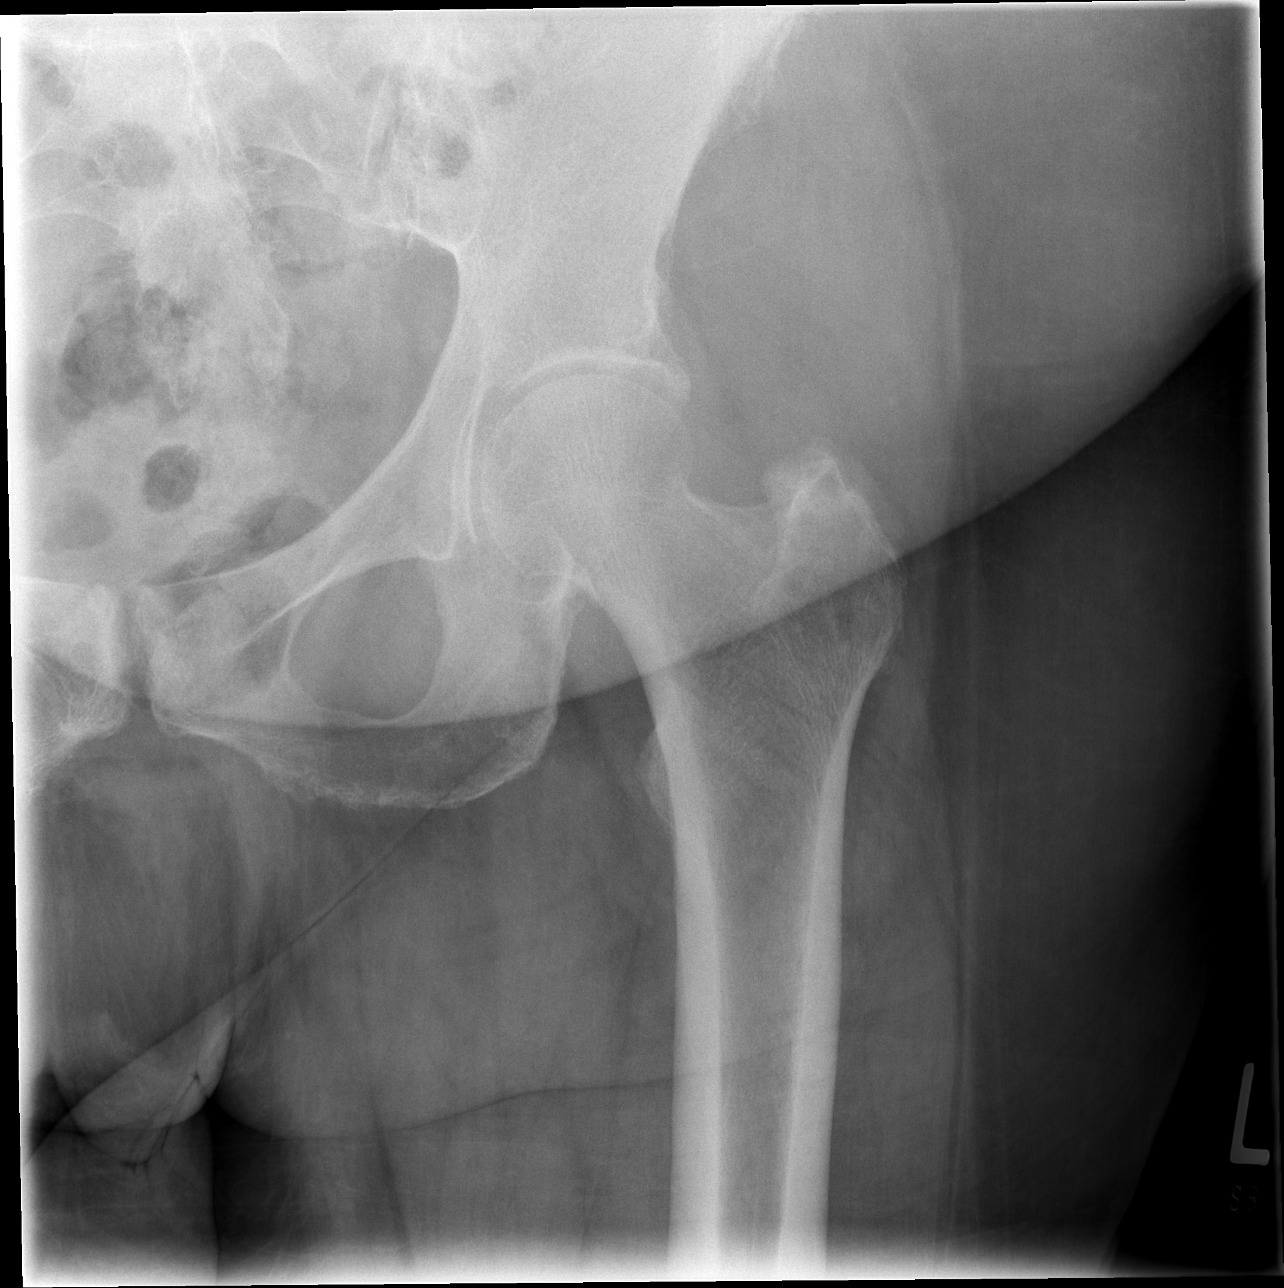

[t hip frog leg left *]
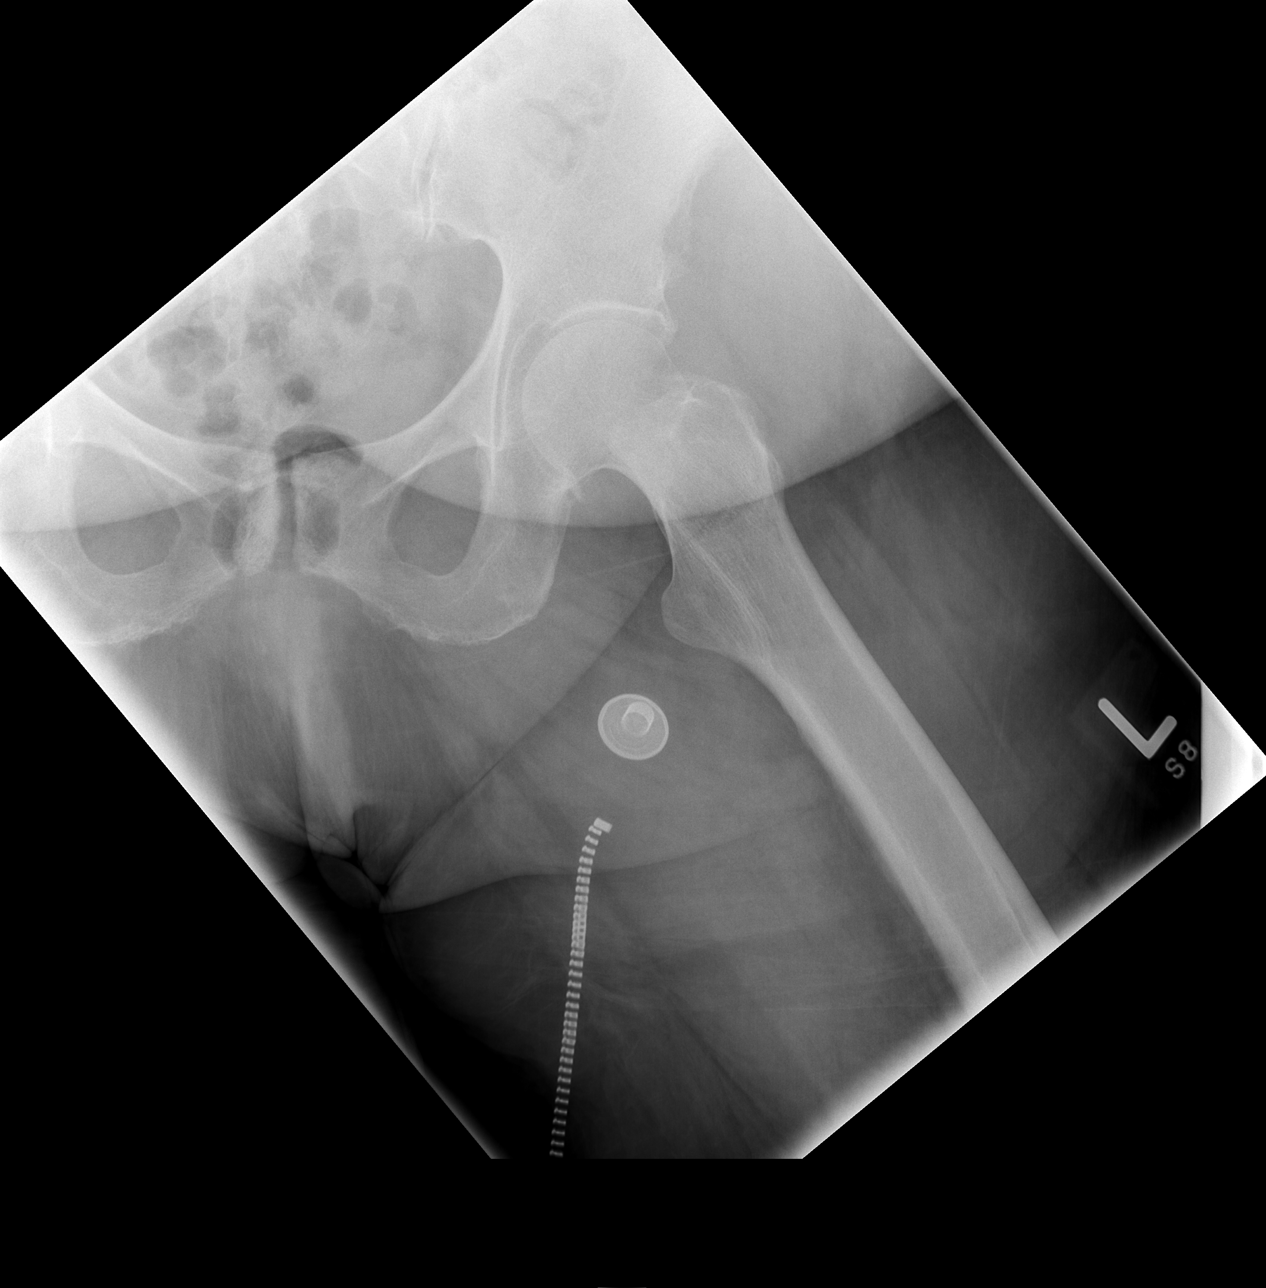

[3 of 3 positions shown; findings below may reference images not displayed]

FINDINGS: No fracture or dislocation is seen.

Hip joint spaces are symmetric.

Degenerative changes lower lumbar spine.

Visualized bony pelvis appears intact.
IMPRESSION: No fracture or dislocation is seen.

## 2012-11-16 ENCOUNTER — Ambulatory Visit (INDEPENDENT_AMBULATORY_CARE_PROVIDER_SITE_OTHER): Payer: Medicare Other | Admitting: Internal Medicine

## 2012-11-16 ENCOUNTER — Encounter: Payer: Self-pay | Admitting: Internal Medicine

## 2012-11-16 VITALS — BP 139/57 | HR 74 | Temp 96.7°F | Ht 68.4 in | Wt 278.0 lb

## 2012-11-16 DIAGNOSIS — Z139 Encounter for screening, unspecified: Secondary | ICD-10-CM

## 2012-11-16 DIAGNOSIS — M810 Age-related osteoporosis without current pathological fracture: Secondary | ICD-10-CM | POA: Diagnosis not present

## 2012-11-16 DIAGNOSIS — M25552 Pain in left hip: Secondary | ICD-10-CM

## 2012-11-16 DIAGNOSIS — M25559 Pain in unspecified hip: Secondary | ICD-10-CM | POA: Diagnosis not present

## 2012-11-16 MED ORDER — HYDROCODONE-ACETAMINOPHEN 5-300 MG PO TABS: 1.0000 | ORAL_TABLET | Freq: Four times a day (QID) | ORAL | Status: DC | PRN

## 2012-11-16 NOTE — Patient Instructions (Addendum)
Obtain DEXA (bone density) scan. Obtain mammogram. Return in three months. Use Benadryl lotion/cream for itching.

## 2012-11-16 NOTE — Progress Notes (Signed)
  Subjective:    Patient ID: Christine [name], female    DOB: [birth year], 77 y.o.   MRN: [id number]  HPI Presents today as a follow up. States she stopped Celexa on her own two months ago. Denies depression or mood changes. She states she does not feel depressed any longer and is doing well without it. Denies suicidal thoughts or homicidal thoughts. States she had a colonoscopy less than 10 years ago (in [place]).   States she's had one fall two months ago that was mechanical and not associated with symptoms.  She reports no further falls. She states she has an occasional itchy rash and wants a potent steroid cream.  Review of Systems Complete 12 point review of systems is otherwise negative except for that stated in the HPI.    Objective:   Physical Exam Filed Vitals:   11/16/12 0847  BP: 139/57  Pulse: 74  Temp: 96.7 F (35.9 C)   GEN: AAOx3, NAD. HEENT: EOMI, PERRLA. CV: S1S2, no m/r/g, RRR PULM: CTA bilat ABD/GI: Soft, Obese, NT, +BS. LE/UE: 2/4 pulses, no c/c/e.  NEURO: CN II-XII intact, no focal deficits. SKIN: Scattered raised bumps over sites of bra straps.     Assessment & Plan:  77 yr. Old AAF w/ pmhx significant for osteoporosis, HTN, HL, morbid obesity Body mass index is 41.78 kg/(m^2)., urinary incontinence, recent gluteus minimum tendon tear, presents for follow up. 1) HTN: Well controlled. 2) Osteoporosis: Carries this diagnosis, but I see no DEXA scan.  This will need to be ordered. 3) Fall: No further falls.  Uses a cane. 4) Pruritis/Rash: Likely secondary to pressure/sweat from straps. Use benadryl lotion, daily bathing. Attempt to change brassier material. 5) Health Maintenance: DEXA ordered. Refuses flu vaccine. Need Colonoscopy records. Mammogram ordered.  Jonah Blue

## 2012-11-28 ENCOUNTER — Ambulatory Visit (HOSPITAL_COMMUNITY): Payer: Medicare Other

## 2012-12-01 ENCOUNTER — Ambulatory Visit (HOSPITAL_COMMUNITY)
Admission: RE | Admit: 2012-12-01 | Discharge: 2012-12-01 | Disposition: A | Discharge status: Home / Self Care | Payer: Medicare Other | Source: Ambulatory Visit | Attending: Internal Medicine | Admitting: Internal Medicine

## 2012-12-01 DIAGNOSIS — Z13820 Encounter for screening for osteoporosis: Secondary | ICD-10-CM | POA: Insufficient documentation

## 2012-12-01 DIAGNOSIS — Z1231 Encounter for screening mammogram for malignant neoplasm of breast: Secondary | ICD-10-CM | POA: Diagnosis not present

## 2012-12-01 DIAGNOSIS — M899 Disorder of bone, unspecified: Secondary | ICD-10-CM | POA: Diagnosis not present

## 2012-12-01 DIAGNOSIS — Z139 Encounter for screening, unspecified: Secondary | ICD-10-CM

## 2012-12-01 DIAGNOSIS — M810 Age-related osteoporosis without current pathological fracture: Secondary | ICD-10-CM

## 2012-12-01 DIAGNOSIS — Z780 Asymptomatic menopausal state: Secondary | ICD-10-CM | POA: Insufficient documentation

## 2012-12-12 ENCOUNTER — Encounter: Payer: Self-pay | Admitting: Internal Medicine

## 2012-12-12 NOTE — Progress Notes (Signed)
Patient ID: Christine [name], female   DOB: August 02, 1936, 77 y.o.   MRN: [id number] Bone density test received. WHO classification normal, AP Spine T score 0.1 and Femur T score -0.8. Test performed on 12/01/12.

## 2013-01-03 ENCOUNTER — Ambulatory Visit: Payer: Medicare Other | Admitting: Internal Medicine

## 2013-02-03 ENCOUNTER — Other Ambulatory Visit: Payer: Self-pay | Admitting: Internal Medicine

## 2013-02-26 ENCOUNTER — Other Ambulatory Visit: Payer: Self-pay | Admitting: *Deleted

## 2013-02-26 DIAGNOSIS — I10 Essential (primary) hypertension: Secondary | ICD-10-CM

## 2013-02-26 MED ORDER — LISINOPRIL 20 MG PO TABS: 20.0000 mg | ORAL_TABLET | Freq: Every day | ORAL | Status: DC

## 2013-05-29 ENCOUNTER — Other Ambulatory Visit: Payer: Self-pay | Admitting: Internal Medicine

## 2013-06-14 ENCOUNTER — Other Ambulatory Visit: Payer: Self-pay | Admitting: *Deleted

## 2013-06-14 DIAGNOSIS — I10 Essential (primary) hypertension: Secondary | ICD-10-CM

## 2013-06-15 ENCOUNTER — Telehealth: Payer: Self-pay | Admitting: Internal Medicine

## 2013-06-15 MED ORDER — LISINOPRIL 20 MG PO TABS: 20.0000 mg | ORAL_TABLET | Freq: Every day | ORAL | Status: DC

## 2013-06-15 NOTE — Telephone Encounter (Signed)
Received phone call  this morning from the patient requesting that her BP  Medication be refilled.  Patient has stated that she has called on 09/02/014, 06/14/2013 and today 06/15/2013 with no response.  Patient did not understand why it was taking so long for a response.  Patient also stated that she left a message that she has changed her drug store to [facility] on [address] a week and a half ago from [facility] on [address].

## 2013-06-28 ENCOUNTER — Other Ambulatory Visit: Payer: Self-pay | Admitting: Internal Medicine

## 2013-08-04 ENCOUNTER — Other Ambulatory Visit: Payer: Self-pay | Admitting: Internal Medicine

## 2013-08-16 ENCOUNTER — Other Ambulatory Visit: Payer: Self-pay | Admitting: Internal Medicine

## 2013-08-20 ENCOUNTER — Ambulatory Visit (INDEPENDENT_AMBULATORY_CARE_PROVIDER_SITE_OTHER): Payer: Medicare Other | Admitting: Internal Medicine

## 2013-08-20 VITALS — BP 152/102 | HR 96 | Temp 96.6°F | Ht 68.5 in | Wt 294.7 lb

## 2013-08-20 DIAGNOSIS — M25552 Pain in left hip: Secondary | ICD-10-CM

## 2013-08-20 DIAGNOSIS — M25519 Pain in unspecified shoulder: Secondary | ICD-10-CM | POA: Diagnosis not present

## 2013-08-20 DIAGNOSIS — J309 Allergic rhinitis, unspecified: Secondary | ICD-10-CM

## 2013-08-20 DIAGNOSIS — S73192S Other sprain of left hip, sequela: Secondary | ICD-10-CM

## 2013-08-20 DIAGNOSIS — M25559 Pain in unspecified hip: Secondary | ICD-10-CM

## 2013-08-20 DIAGNOSIS — L299 Pruritus, unspecified: Secondary | ICD-10-CM

## 2013-08-20 DIAGNOSIS — K5900 Constipation, unspecified: Secondary | ICD-10-CM | POA: Diagnosis not present

## 2013-08-20 DIAGNOSIS — M1710 Unilateral primary osteoarthritis, unspecified knee: Secondary | ICD-10-CM

## 2013-08-20 DIAGNOSIS — I10 Essential (primary) hypertension: Secondary | ICD-10-CM | POA: Diagnosis not present

## 2013-08-20 DIAGNOSIS — W19XXXS Unspecified fall, sequela: Secondary | ICD-10-CM

## 2013-08-20 DIAGNOSIS — IMO0002 Reserved for concepts with insufficient information to code with codable children: Secondary | ICD-10-CM

## 2013-08-20 MED ORDER — HYDROCODONE-ACETAMINOPHEN 5-300 MG PO TABS: 1.0000 | ORAL_TABLET | Freq: Every day | ORAL | Status: DC

## 2013-08-20 MED ORDER — LORATADINE 10 MG PO TABS: ORAL_TABLET | ORAL | Status: DC

## 2013-08-20 MED ORDER — LISINOPRIL 20 MG PO TABS: 20.0000 mg | ORAL_TABLET | Freq: Every day | ORAL | Status: DC

## 2013-08-20 NOTE — Assessment & Plan Note (Signed)
BP Readings from Last 3 Encounters:  08/20/13 153/85  11/16/12 139/57  09/22/12 145/74    Lab Results  Component Value Date   NA 140 06/16/2012   K 4.3 06/16/2012   CREATININE 0.79 06/16/2012    Assessment: Blood pressure control: mildly elevated Progress toward BP goal:  deteriorated Comments: states she just took her meds before clinic  Plan: Medications:  continue current medications Educational resources provided:   Self management tools provided:   Other plans: cont lisinopril 20 mg qd

## 2013-08-20 NOTE — Progress Notes (Signed)
  Subjective:    Patient ID: Christine [name], female    DOB: Apr 27, 1936, 77 y.o.   MRN: [id number]  HPI  Christine [name] is a 77 yo female with hx significant for partially torn distal gluteus minimus tear secondary to mechanical fall (Nov 2013), morbid obesity, hypertension, and osteoarthritis who presents for medication refill and with complaints of worsening left hip and right knee pain.  She states that she has pain "all the time" and has been trying to manage with Advil ~ 6 per day.  States that over the past week her hip pain has worsened especially when she has to stoop, wash out the tub, or do the laundry.  She reports that she is doing the exercises recommended by physical therapy once a day in the morning.  Is requesting refill of Vicodin which she reports only having to take once a day previously. Denies redness, swelling, chest pain or shortness of breath.  She has gotten off schedule with her blood pressure medications and took yesterday's dose at midnight.   Review of Systems  Constitutional: Negative for fever and fatigue.  HENT: Negative for congestion.   Respiratory: Negative for shortness of breath.   Cardiovascular: Negative for chest pain and leg swelling.  Gastrointestinal: Negative for diarrhea and constipation.  Genitourinary: Positive for urgency. Negative for dysuria.  Musculoskeletal: Positive for arthralgias. Negative for joint swelling.  Skin: Negative for rash and wound.  Neurological: Negative for dizziness, weakness and headaches.       Objective:   Physical Exam  Constitutional: She is oriented to person, place, and time. She appears well-developed and well-nourished. No distress.  HENT:  Head: Normocephalic and atraumatic.  Eyes: Conjunctivae and EOM are normal. Pupils are equal, round, and reactive to light.  Neck: Neck supple.  Cardiovascular: Normal rate, regular rhythm and normal heart sounds.   No murmur heard. Pulmonary/Chest: Effort normal and breath  sounds normal. No respiratory distress.  Abdominal: Soft. Bowel sounds are normal. There is no tenderness.  obese  Musculoskeletal: She exhibits tenderness. She exhibits no edema.       Left hip: She exhibits tenderness.       Legs: Neurological: She is alert and oriented to person, place, and time.  Skin: Skin is warm and dry.  Psychiatric: She has a normal mood and affect.          Assessment & Plan:  See separate problem list charting:  #1. Gluteus minimus syndrome: s/p partial tear a year ago, she is doing her ADLs which seems to have gotten to a point where it is causing strain on her prior injuries. She has been taking ~ 1200 mg of ibuprofen per day (1 tab 6 times a day) without relief, as well as PT exercises.  I had pt demonstrate the exercises to me today. -counseled against aggravating postures ie stooping, bending with straight legs, etc -will re-new hydrocodone-APAP 5/300 mg prescription but change to 1 pill a day as pt states this was sufficient dosing previously. -pain contract and FYI updated Norco 5/300 1 qd #31 -if pain continues to worsening consider further evaluation for possible calcification of prior injury site  #2 knee pain, chronic: secondary to osteoarthritis, cont to use cane  #3 hypertension: slightly above goal today, reinforced need for consistency with meds, -will monitor -refilled lisinopril 20 mg qd

## 2013-08-20 NOTE — Patient Instructions (Signed)
General Instructions: It was nice to meet you today, Christine [name]. We have refilled your itching medicine (Loratadine), blood pressure medicine (lisinopril), and pain medication (hydrocodone-acetaminophen). Your pain contracted was updated. Take 1 pain pill per day as you described. Your blood pressure is a bit elevated today.  Try to take your medicines at the same time every day. Follow-up with your Primary Physician.   Treatment Goals:  Goals (1 Years of Data) as of 08/20/13         As of Today 11/16/12 09/22/12 08/24/12 08/10/12     Blood Pressure    . Blood Pressure < 140/90  153/85 139/57 145/74 129/68 114/72      Progress Toward Treatment Goals:  Treatment Goal 08/20/2013  Blood pressure deteriorated    Self Care Goals & Plans:  Self Care Goal 08/20/2013  Manage my medications take my medicines as prescribed; bring my medications to every visit; refill my medications on time  Eat healthy foods eat foods that are low in salt  Be physically active (No Data)    No flowsheet data found.   Care Management & Community Referrals:  No flowsheet data found.

## 2013-08-21 NOTE — Progress Notes (Signed)
Case discussed with Dr. Schooler soon after the resident saw the patient.  We reviewed the resident's history and exam and pertinent patient test results.  I agree with the assessment, diagnosis and plan of care documented in the resident's note. 

## 2013-08-24 ENCOUNTER — Telehealth: Payer: Self-pay | Admitting: *Deleted

## 2013-08-24 MED ORDER — LORATADINE 10 MG PO TABS: ORAL_TABLET | ORAL | Status: DC

## 2013-08-24 NOTE — Telephone Encounter (Signed)
I have sent this in

## 2013-08-24 NOTE — Telephone Encounter (Signed)
Pt calls and states her claritin was changed to 1 twice daily, instead of 60 she was given only #30, can we change this in the med list and send a new script?

## 2013-10-17 ENCOUNTER — Other Ambulatory Visit: Payer: Self-pay | Admitting: Internal Medicine

## 2013-10-17 ENCOUNTER — Other Ambulatory Visit: Payer: Self-pay | Admitting: *Deleted

## 2013-10-17 DIAGNOSIS — I10 Essential (primary) hypertension: Secondary | ICD-10-CM

## 2013-10-17 MED ORDER — LORATADINE 10 MG PO TABS: ORAL_TABLET | ORAL | Status: DC

## 2013-10-17 MED ORDER — LISINOPRIL 20 MG PO TABS: 20.0000 mg | ORAL_TABLET | Freq: Every day | ORAL | Status: DC

## 2013-11-06 ENCOUNTER — Other Ambulatory Visit: Payer: Self-pay | Admitting: Internal Medicine

## 2013-11-06 DIAGNOSIS — Z1231 Encounter for screening mammogram for malignant neoplasm of breast: Secondary | ICD-10-CM

## 2013-12-25 ENCOUNTER — Encounter: Payer: Self-pay | Admitting: Internal Medicine

## 2013-12-25 ENCOUNTER — Ambulatory Visit (INDEPENDENT_AMBULATORY_CARE_PROVIDER_SITE_OTHER): Payer: Medicare Other | Admitting: Internal Medicine

## 2013-12-25 VITALS — BP 130/74 | HR 93 | Temp 96.6°F | Ht 68.4 in | Wt 303.7 lb

## 2013-12-25 DIAGNOSIS — L298 Other pruritus: Secondary | ICD-10-CM | POA: Insufficient documentation

## 2013-12-25 DIAGNOSIS — L259 Unspecified contact dermatitis, unspecified cause: Secondary | ICD-10-CM | POA: Diagnosis not present

## 2013-12-25 DIAGNOSIS — L309 Dermatitis, unspecified: Secondary | ICD-10-CM

## 2013-12-25 DIAGNOSIS — L299 Pruritus, unspecified: Secondary | ICD-10-CM

## 2013-12-25 DIAGNOSIS — L219 Seborrheic dermatitis, unspecified: Secondary | ICD-10-CM | POA: Diagnosis not present

## 2013-12-25 MED ORDER — HYDROCORTISONE 1 % EX CREA: TOPICAL_CREAM | CUTANEOUS | Status: AC

## 2013-12-25 MED ORDER — LORATADINE 10 MG PO TABS: ORAL_TABLET | ORAL | Status: DC

## 2013-12-25 MED ORDER — KETOCONAZOLE 2 % EX SHAM: 1.0000 "application " | MEDICATED_SHAMPOO | CUTANEOUS | Status: DC

## 2013-12-25 MED ORDER — FLUOCINOLONE ACETONIDE SCALP 0.01 % EX OIL: TOPICAL_OIL | CUTANEOUS | Status: DC

## 2013-12-25 NOTE — Progress Notes (Signed)
   Subjective:    Patient ID: Christine [name], female    DOB: [birth year], 78 y.o.   MRN: [id number]  HPI  Presents with complaints of itching all over. Hx is significant for prior treatment by her former dermatologist Dr. Clydene Laming (who is now deceased) antifungal scalp oil, shampoo, and steroid creams of which she has brought in the empty containers and expired medications. Patient is unclear of her exact diagnosis but states that these agents worked.  Patient states that due to her Medicaid status she has not seen a dermatologist for several years. Today her complaints center on facial itchiness, anterior chest itchiness, torso and back, and lower extremities especially near her ankles. Denies fever, chills, or weight loss.   Review of Systems  Constitutional: Negative for fever, fatigue and unexpected weight change.  HENT: Negative.   Eyes: Negative.   Respiratory: Negative.   Cardiovascular: Negative.   Gastrointestinal: Negative.   Endocrine: Negative.   Genitourinary: Negative.   Skin: Positive for rash.  Neurological: Negative.   Hematological: Negative.   Psychiatric/Behavioral: Negative.        Objective:   Physical Exam  Constitutional: She is oriented to person, place, and time. She appears well-developed and well-nourished.  Morbidly obese  HENT:  Head: Normocephalic and atraumatic.  Eyes: Conjunctivae and EOM are normal. Pupils are equal, round, and reactive to light.  Neck: Normal range of motion. Neck supple. No thyromegaly present.  Cardiovascular: Normal rate, regular rhythm and normal heart sounds.   Pulmonary/Chest: Effort normal and breath sounds normal.  Abdominal: Soft. Bowel sounds are normal. She exhibits no distension. There is no tenderness.  Obese  Musculoskeletal: She exhibits no tenderness.  Trace lower extremity edema bilaterally  Neurological: She is alert and oriented to person, place, and time.  Skin: Skin is warm and dry. Rash noted. Rash is  macular.     Psychiatric: She has a normal mood and affect.          Assessment & Plan:  See separate problem list charting

## 2013-12-25 NOTE — Patient Instructions (Signed)
We have refilled some of your prior itch medications that Dr. Lucretia RoersWood prescribe previously. This includes the shampoo and oil. We believe your itching on the face, scalp, and chest is from seborrheic dermatitis. For your body itching, this may be a mild form of eczema.   Get the Eucerin cream from the drug store and apply it at least twice daily. We will refer you to dermatology. Return to clinic in 1-2 months so that we can check your progress.  Seborrheic Dermatitis Seborrheic dermatitis involves pink or red skin with greasy, flaky scales. This is often found on the scalp, eyebrows, nose, bearded area, and on or behind the ears. It can also occur on the central chest. It often occurs where there are more oil (sebaceous) glands. This condition is also known as dandruff. When this condition affects a baby's scalp, it is called cradle cap. It may come and go for no known reason. It can occur at any time of life from infancy to old age. CAUSES  The cause is unknown. It is not the result of too little moisture or too much oil. In some people, seborrheic dermatitis flare-ups seem to be triggered by stress. It also commonly occurs in people with certain diseases such as Parkinson's disease or HIV/AIDS. SYMPTOMS   Thick scales on the scalp.  Redness on the face or in the armpits.  The skin may seem oily or dry, but moisturizers do not help.  In infants, seborrheic dermatitis appears as scaly redness that does not seem to bother the baby. In some babies, it affects only the scalp. In others, it also affects the neck creases, armpits, groin, or behind the ears.  In adults and adolescents, seborrheic dermatitis may affect only the scalp. It may look patchy or spread out, with areas of redness and flaking. Other areas commonly affected include:  Eyebrows.  Eyelids.  Forehead.  Skin behind the ears.  Outer ears.  Chest.  Armpits.  Nose creases.  Skin creases under the breasts.  Skin  between the buttocks.  Groin.  Some adults and adolescents feel itching or burning in the affected areas. DIAGNOSIS  Your caregiver can usually tell what the problem is by doing a physical exam. TREATMENT   Cortisone (steroid) ointments, creams, and lotions can help decrease inflammation.  Babies can be treated with baby oil to soften the scales, then they may be washed with baby shampoo. If this does not help, a prescription topical steroid medicine may work.  Adults can use medicated shampoos.  Your caregiver may prescribe corticosteroid cream and shampoo containing an antifungal or yeast medicine (ketoconazole). Hydrocortisone or anti-yeast cream can be rubbed directly onto seborrheic dermatitis patches. Yeast does not cause seborrheic dermatitis, but it seems to add to the problem. In infants, seborrheic dermatitis is often worst during the first year of life. It tends to disappear on its own as the child grows. However, it may return during the teenage years. In adults and adolescents, seborrheic dermatitis tends to be a long-lasting condition that comes and goes over many years. HOME CARE INSTRUCTIONS   Use prescribed medicines as directed.  In infants, do not aggressively remove the scales or flakes on the scalp with a comb or by other means. This may lead to hair loss. SEEK MEDICAL CARE IF:   The problem does not improve from the medicated shampoos, lotions, or other medicines given by your caregiver.  You have any other questions or concerns. Document Released: 09/27/2005 Document Revised: 03/28/2012 Document  Reviewed: 02/16/2010 Exit[employer] Patient Information 2014 [place], [place].  Eczema Eczema, also called atopic dermatitis, is a skin disorder that causes inflammation of the skin. It causes a red rash and dry, scaly skin. The skin becomes very itchy. Eczema is generally worse during the cooler winter months and often improves with the warmth of summer. Eczema usually starts  showing signs in infancy. Some children outgrow eczema, but it may last through adulthood.  CAUSES  The exact cause of eczema is not known, but it appears to run in families. People with eczema often have a family history of eczema, allergies, asthma, or hay fever. Eczema is not contagious. Flare-ups of the condition may be caused by:   Contact with something you are sensitive or allergic to.   Stress. SIGNS AND SYMPTOMS  Dry, scaly skin.   Red, itchy rash.   Itchiness. This may occur before the skin rash and may be very intense.  DIAGNOSIS  The diagnosis of eczema is usually made based on symptoms and medical history. TREATMENT  Eczema cannot be cured, but symptoms usually can be controlled with treatment and other strategies. A treatment plan might include:  Controlling the itching and scratching.   Use over-the-counter antihistamines as directed for itching. This is especially useful at night when the itching tends to be worse.   Use over-the-counter steroid creams as directed for itching.   Avoid scratching. Scratching makes the rash and itching worse. It may also result in a skin infection (impetigo) due to a break in the skin caused by scratching.   Keeping the skin well moisturized with creams every day. This will seal in moisture and help prevent dryness. Lotions that contain alcohol and water should be avoided because they can dry the skin.   Limiting exposure to things that you are sensitive or allergic to (allergens).   Recognizing situations that cause stress.   Developing a plan to manage stress.  HOME CARE INSTRUCTIONS   Only take over-the-counter or prescription medicines as directed by your health care provider.   Do not use anything on the skin without checking with your health care provider.   Keep baths or showers short (5 minutes) in warm (not hot) water. Use mild cleansers for bathing. These should be unscented. You may add nonperfumed bath  oil to the bath water. It is best to avoid soap and bubble bath.   Immediately after a bath or shower, when the skin is still damp, apply a moisturizing ointment to the entire body. This ointment should be a petroleum ointment. This will seal in moisture and help prevent dryness. The thicker the ointment, the better. These should be unscented.   Keep fingernails cut short. Children with eczema may need to wear soft gloves or mittens at night after applying an ointment.   Dress in clothes made of cotton or cotton blends. Dress lightly, because heat increases itching.   A child with eczema should stay away from anyone with fever blisters or cold sores. The virus that causes fever blisters (herpes simplex) can cause a serious skin infection in children with eczema. SEEK MEDICAL CARE IF:   Your itching interferes with sleep.   Your rash gets worse or is not better within 1 week after starting treatment.   You see pus or soft yellow scabs in the rash area.   You have a fever.   You have a rash flare-up after contact with someone who has fever blisters.  Document Released: 09/24/2000 Document Revised: 07/18/2013 Document  Reviewed: 04/30/2013 Exit[employer] Patient Information 2014 [place], [place].

## 2013-12-27 NOTE — Progress Notes (Signed)
Case discussed with Dr. Michail Sermon soon after the resident saw the patient.  We reviewed the resident's history and exam and pertinent patient test results.  I agree with the assessment, diagnosis and plan of care documented in the resident's note.

## 2013-12-27 NOTE — Assessment & Plan Note (Signed)
Her clinical exam is consistent with seborrheic dermatitis. I will refill her formal prescriptions for you, and all shampoo and fluocinolone scalp oil but will defer a refill of clobetasol to her next dermatologists. Until then she can use hydrocortisone cream 1%. -Referral to dermatologist placed

## 2013-12-27 NOTE — Assessment & Plan Note (Addendum)
Exam consistent with xerotic eczema. Recommended Eucerin cream at least twice daily, lukewarm bathing, and continue protective ointment such as Vaseline or Aquaphor as well as when necessary hydrocortisone 1% cream -Referral to dermatologist given for more complex regimen and unclear prior dermatologic diagnoses

## 2014-02-05 ENCOUNTER — Ambulatory Visit (INDEPENDENT_AMBULATORY_CARE_PROVIDER_SITE_OTHER): Payer: Medicare Other | Admitting: Internal Medicine

## 2014-02-05 ENCOUNTER — Encounter: Payer: Self-pay | Admitting: Internal Medicine

## 2014-02-05 ENCOUNTER — Ambulatory Visit (HOSPITAL_COMMUNITY)
Admission: RE | Admit: 2014-02-05 | Discharge: 2014-02-05 | Disposition: A | Discharge status: Home / Self Care | Payer: Medicare Other | Source: Ambulatory Visit | Attending: Internal Medicine | Admitting: Internal Medicine

## 2014-02-05 VITALS — BP 137/75 | HR 91 | Temp 96.6°F | Ht 68.4 in | Wt 306.8 lb

## 2014-02-05 DIAGNOSIS — R7989 Other specified abnormal findings of blood chemistry: Secondary | ICD-10-CM

## 2014-02-05 DIAGNOSIS — M25519 Pain in unspecified shoulder: Secondary | ICD-10-CM | POA: Diagnosis not present

## 2014-02-05 DIAGNOSIS — E785 Hyperlipidemia, unspecified: Secondary | ICD-10-CM

## 2014-02-05 DIAGNOSIS — M25511 Pain in right shoulder: Secondary | ICD-10-CM

## 2014-02-05 DIAGNOSIS — I10 Essential (primary) hypertension: Secondary | ICD-10-CM | POA: Diagnosis not present

## 2014-02-05 DIAGNOSIS — R32 Unspecified urinary incontinence: Secondary | ICD-10-CM | POA: Diagnosis not present

## 2014-02-05 DIAGNOSIS — M19019 Primary osteoarthritis, unspecified shoulder: Secondary | ICD-10-CM | POA: Diagnosis not present

## 2014-02-05 DIAGNOSIS — M19011 Primary osteoarthritis, right shoulder: Secondary | ICD-10-CM | POA: Insufficient documentation

## 2014-02-05 DIAGNOSIS — L219 Seborrheic dermatitis, unspecified: Secondary | ICD-10-CM | POA: Diagnosis not present

## 2014-02-05 LAB — COMPLETE METABOLIC PANEL WITH GFR
ALK PHOS: 86 U/L (ref 39–117)
ALT: 14 U/L (ref 0–35)
AST: 17 U/L (ref 0–37)
Albumin: 4.1 g/dL (ref 3.5–5.2)
BILIRUBIN TOTAL: 0.4 mg/dL (ref 0.2–1.2)
BUN: 14 mg/dL (ref 6–23)
CO2: 30 mEq/L (ref 19–32)
Calcium: 9.7 mg/dL (ref 8.4–10.5)
Chloride: 98 mEq/L (ref 96–112)
Creat: 0.74 mg/dL (ref 0.50–1.10)
GFR, Est African American: >89 mL/min
GFR, Est Non African American: 79 mL/min
Glucose, Bld: 91 mg/dL (ref 70–99)
POTASSIUM: 4 meq/L (ref 3.5–5.3)
SODIUM: 137 meq/L (ref 135–145)
TOTAL PROTEIN: 7.9 g/dL (ref 6.0–8.3)

## 2014-02-05 LAB — GLUCOSE, CAPILLARY: GLUCOSE-CAPILLARY: 94 mg/dL (ref 70–99)

## 2014-02-05 LAB — POCT GLYCOSYLATED HEMOGLOBIN (HGB A1C): Hemoglobin A1C: 6.1

## 2014-02-05 MED ORDER — ATORVASTATIN CALCIUM 40 MG PO TABS: ORAL_TABLET | ORAL | Status: DC

## 2014-02-05 MED ORDER — LISINOPRIL 20 MG PO TABS: 20.0000 mg | ORAL_TABLET | Freq: Every day | ORAL | Status: DC

## 2014-02-05 MED ORDER — HYDROCODONE-ACETAMINOPHEN 5-325 MG PO TABS: 1.0000 | ORAL_TABLET | Freq: Four times a day (QID) | ORAL | Status: DC | PRN

## 2014-02-05 NOTE — Assessment & Plan Note (Addendum)
Will w/u for overflow with HA1C was 6.1 today (prediabetes)  Will check UA to r/o infection, will check CMET Kegel exercises not helping the patient  Likely stress from multiple (9 vaginal deliveries)  Consider medication tx if other causes ruled out  disc'ed sch bladder emptying every 2-3 hours  F/u in 3 months

## 2014-02-05 NOTE — Assessment & Plan Note (Signed)
Rx refill of Lipitor today

## 2014-02-05 NOTE — Patient Instructions (Addendum)
General Instructions: Please follow up in 3 months, sooner if needed  Read the information below    Treatment Goals:  Goals (1 Years of Data) as of 02/05/14         As of Today 12/25/13 08/20/13 08/20/13 11/16/12     Blood Pressure    . Blood Pressure < 140/90  137/75 130/74 152/102 153/85 139/57      Progress Toward Treatment Goals:  Treatment Goal 02/05/2014  Blood pressure at goal    Self Care Goals & Plans:  Self Care Goal 02/05/2014  Manage my medications take my medicines as prescribed; bring my medications to every visit; refill my medications on time  Monitor my health keep track of my blood pressure  Eat healthy foods drink diet soda or water instead of juice or soda; eat more vegetables; eat foods that are low in salt; eat baked foods instead of fried foods; eat fruit for snacks and desserts; eat smaller portions  Be physically active find an activity I enjoy  Meeting treatment goals maintain the current self-care plan    No flowsheet data found.   Care Management & Community Referrals:  Referral 02/05/2014  Referrals made for care management support none needed  Referrals made to community resources none       Shoulder Pain The shoulder is the joint that connects your arms to your body. The bones that form the shoulder joint include the upper arm bone (humerus), the shoulder blade (scapula), and the collarbone (clavicle). The top of the humerus is shaped like a ball and fits into a rather flat socket on the scapula (glenoid cavity). A combination of muscles and strong, fibrous tissues that connect muscles to bones (tendons) support your shoulder joint and hold the ball in the socket. Small, fluid-filled sacs (bursae) are located in different areas of the joint. They act as cushions between the bones and the overlying soft tissues and help reduce friction between the gliding tendons and the bone as you move your arm. Your shoulder joint allows a wide range of motion in  your arm. This range of motion allows you to do things like scratch your back or throw a ball. However, this range of motion also makes your shoulder more prone to pain from overuse and injury. Causes of shoulder pain can originate from both injury and overuse and usually can be grouped in the following four categories:  Redness, swelling, and pain (inflammation) of the tendon (tendinitis) or the bursae (bursitis).  Instability, such as a dislocation of the joint.  Inflammation of the joint (arthritis).  Broken bone (fracture). HOME CARE INSTRUCTIONS   Apply ice to the sore area.  Put ice in a plastic bag.  Place a towel between your skin and the bag.  Leave the ice on for 15-20 minutes, 03-04 times per day for the first 2 days.  Stop using cold packs if they do not help with the pain.  If you have a shoulder sling or immobilizer, wear it as long as your caregiver instructs. Only remove it to shower or bathe. Move your arm as little as possible, but keep your hand moving to prevent swelling.  Squeeze a soft ball or foam pad as much as possible to help prevent swelling.  Only take over-the-counter or prescription medicines for pain, discomfort, or fever as directed by your caregiver. SEEK MEDICAL CARE IF:   Your shoulder pain increases, or new pain develops in your arm, hand, or fingers.  Your hand or  fingers become cold and numb.  Your pain is not relieved with medicines. SEEK IMMEDIATE MEDICAL CARE IF:   Your arm, hand, or fingers are numb or tingling.  Your arm, hand, or fingers are significantly swollen or turn white or blue. MAKE SURE YOU:   Understand these instructions.  Will watch your condition.  Will get help right away if you are not doing well or get worse. Document Released: 07/07/2005 Document Revised: 06/21/2012 Document Reviewed: 09/11/2011 [facility] Patient Information 2014 [place].  Arthritis, Nonspecific Arthritis is inflammation of a joint.  This usually means pain, redness, warmth or swelling are present. One or more joints may be involved. There are a number of types of arthritis. Your caregiver may not be able to tell what type of arthritis you have right away. CAUSES  The most common cause of arthritis is the wear and tear on the joint (osteoarthritis). This causes damage to the cartilage, which can break down over time. The knees, hips, back and neck are most often affected by this type of arthritis. Other types of arthritis and common causes of joint pain include:  Sprains and other injuries near the joint. Sometimes minor sprains and injuries cause pain and swelling that develop hours later.  Rheumatoid arthritis. This affects hands, feet and knees. It usually affects both sides of your body at the same time. It is often associated with chronic ailments, fever, weight loss and general weakness.  Crystal arthritis. Gout and pseudo gout can cause occasional acute severe pain, redness and swelling in the foot, ankle, or knee.  Infectious arthritis. Bacteria can get into a joint through a break in overlying skin. This can cause infection of the joint. Bacteria and viruses can also spread through the blood and affect your joints.  Drug, infectious and allergy reactions. Sometimes joints can become mildly painful and slightly swollen with these types of illnesses. SYMPTOMS   Pain is the main symptom.  Your joint or joints can also be red, swollen and warm or hot to the touch.  You may have a fever with certain types of arthritis, or even feel overall ill.  The joint with arthritis will hurt with movement. Stiffness is present with some types of arthritis. DIAGNOSIS  Your caregiver will suspect arthritis based on your description of your symptoms and on your exam. Testing may be needed to find the type of arthritis:  Blood and sometimes urine tests.  X-ray tests and sometimes CT or MRI scans.  Removal of fluid from the  joint (arthrocentesis) is done to check for bacteria, crystals or other causes. Your caregiver (or a specialist) will numb the area over the joint with a local anesthetic, and use a needle to remove joint fluid for examination. This procedure is only minimally uncomfortable.  Even with these tests, your caregiver may not be able to tell what kind of arthritis you have. Consultation with a specialist (rheumatologist) may be helpful. TREATMENT  Your caregiver will discuss with you treatment specific to your type of arthritis. If the specific type cannot be determined, then the following general recommendations may apply. Treatment of severe joint pain includes:  Rest.  Elevation.  Anti-inflammatory medication (for example, ibuprofen) may be prescribed. Avoiding activities that cause increased pain.  Only take over-the-counter or prescription medicines for pain and discomfort as recommended by your caregiver.  Cold packs over an inflamed joint may be used for 10 to 15 minutes every hour. Hot packs sometimes feel better, but do not use overnight.  Do not use hot packs if you are diabetic without your caregiver's permission.  A cortisone shot into arthritic joints may help reduce pain and swelling.  Any acute arthritis that gets worse over the next 1 to 2 days needs to be looked at to be sure there is no joint infection. Long-term arthritis treatment involves modifying activities and lifestyle to reduce joint stress jarring. This can include weight loss. Also, exercise is needed to nourish the joint cartilage and remove waste. This helps keep the muscles around the joint strong. HOME CARE INSTRUCTIONS   Do not take aspirin to relieve pain if gout is suspected. This elevates uric acid levels.  Only take over-the-counter or prescription medicines for pain, discomfort or fever as directed by your caregiver.  Rest the joint as much as possible.  If your joint is swollen, keep it elevated.  Use  crutches if the painful joint is in your leg.  Drinking plenty of fluids may help for certain types of arthritis.  Follow your caregiver's dietary instructions.  Try low-impact exercise such as:  Swimming.  Water aerobics.  Biking.  Walking.  Morning stiffness is often relieved by a warm shower.  Put your joints through regular range-of-motion. SEEK MEDICAL CARE IF:   You do not feel better in 24 hours or are getting worse.  You have side effects to medications, or are not getting better with treatment. SEEK IMMEDIATE MEDICAL CARE IF:   You have a fever.  You develop severe joint pain, swelling or redness.  Many joints are involved and become painful and swollen.  There is severe back pain and/or leg weakness.  You have loss of bowel or bladder control. Document Released: 11/04/2004 Document Revised: 12/20/2011 Document Reviewed: 11/20/2008 [facility] Patient Information 2014 [place]. Exercise to Lose Weight Exercise and a healthy diet may help you lose weight. Your doctor may suggest specific exercises. EXERCISE IDEAS AND TIPS  Choose low-cost things you enjoy doing, such as walking, bicycling, or exercising to workout videos.  Take stairs instead of the elevator.  Walk during your lunch break.  Park your car further away from work or school.  Go to a gym or an exercise class.  Start with 5 to 10 minutes of exercise each day. Build up to 30 minutes of exercise 4 to 6 days a week.  Wear shoes with good support and comfortable clothes.  Stretch before and after working out.  Work out until you breathe harder and your heart beats faster.  Drink extra water when you exercise.  Do not do so much that you hurt yourself, feel dizzy, or get very short of breath. Exercises that burn about 150 calories:  Running 1  miles in 15 minutes.  Playing volleyball for 45 to 60 minutes.  Washing and waxing a car for 45 to 60 minutes.  Playing touch football  for 45 minutes.  Walking 1  miles in 35 minutes.  Pushing a stroller 1  miles in 30 minutes.  Playing basketball for 30 minutes.  Raking leaves for 30 minutes.  Bicycling 5 miles in 30 minutes.  Walking 2 miles in 30 minutes.  Dancing for 30 minutes.  Shoveling snow for 15 minutes.  Swimming laps for 20 minutes.  Walking up stairs for 15 minutes.  Bicycling 4 miles in 15 minutes.  Gardening for 30 to 45 minutes.  Jumping rope for 15 minutes.  Washing windows or floors for 45 to 60 minutes. Document Released: 10/30/2010 Document Revised: 12/20/2011 Document Reviewed:  10/30/2010 [employer] Patient Information 2014 [place].  DASH Diet The DASH diet stands for "Dietary Approaches to Stop Hypertension." It is a healthy eating plan that has been shown to reduce high blood pressure (hypertension) in as little as 14 days, while also possibly providing other significant health benefits. These other health benefits include reducing the risk of breast cancer after menopause and reducing the risk of type 2 diabetes, heart disease, colon cancer, and stroke. Health benefits also include weight loss and slowing kidney failure in patients with chronic kidney disease.  DIET GUIDELINES  Limit salt (sodium). Your diet should contain less than 1500 mg of sodium daily.  Limit refined or processed carbohydrates. Your diet should include mostly whole grains. Desserts and added sugars should be used sparingly.  Include small amounts of heart-healthy fats. These types of fats include nuts, oils, and tub margarine. Limit saturated and trans fats. These fats have been shown to be harmful in the body. CHOOSING FOODS  The following food groups are based on a 2000 calorie diet. See your Registered Dietitian for individual calorie needs. Grains and Grain Products (6 to 8 servings daily)  Eat More Often: Whole-wheat bread, brown rice, whole-grain or wheat pasta, quinoa, popcorn without added fat  or salt (air popped).  Eat Less Often: White bread, white pasta, white rice, cornbread. Vegetables (4 to 5 servings daily)  Eat More Often: Fresh, frozen, and canned vegetables. Vegetables may be raw, steamed, roasted, or grilled with a minimal amount of fat.  Eat Less Often/Avoid: Creamed or fried vegetables. Vegetables in a cheese sauce. Fruit (4 to 5 servings daily)  Eat More Often: All fresh, canned (in natural juice), or frozen fruits. Dried fruits without added sugar. One hundred percent fruit juice ( cup [237 mL] daily).  Eat Less Often: Dried fruits with added sugar. Canned fruit in light or heavy syrup. [employer], Fish, and Poultry (2 servings or less daily. One serving is 3 to 4 oz [85-114 g]).  Eat More Often: Ninety percent or leaner ground beef, tenderloin, sirloin. Round cuts of beef, chicken breast, [place] breast. All fish. Grill, bake, or broil your meat. Nothing should be fried.  Eat Less Often/Avoid: Fatty cuts of meat, [place], or chicken leg, thigh, or wing. Fried cuts of meat or fish. Dairy (2 to 3 servings)  Eat More Often: Low-fat or fat-free milk, low-fat plain or light yogurt, reduced-fat or part-skim cheese.  Eat Less Often/Avoid: Milk (whole, 2%).Whole milk yogurt. Full-fat cheeses. Nuts, Seeds, and Legumes (4 to 5 servings per week)  Eat More Often: All without added salt.  Eat Less Often/Avoid: Salted nuts and seeds, canned beans with added salt. Fats and Sweets (limited)  Eat More Often: Vegetable oils, tub margarines without trans fats, sugar-free gelatin. Mayonnaise and salad dressings.  Eat Less Often/Avoid: Coconut oils, palm oils, butter, stick margarine, cream, half and half, cookies, candy, pie. FOR MORE INFORMATION The Dash Diet Eating Plan: www.dashdiet.org Document Released: 09/16/2011 Document Revised: 12/20/2011 Document Reviewed: 09/16/2011 [facility] Patient Information 2014 [place], [place].  Hypertension As your heart beats, it  forces blood through your arteries. This force is your blood pressure. If the pressure is too high, it is called hypertension (HTN) or high blood pressure. HTN is dangerous because you may have it and not know it. High blood pressure may mean that your heart has to work harder to pump blood. Your arteries may be narrow or stiff. The extra work puts you at risk for heart disease, stroke, and  other problems.  Blood pressure consists of two numbers, a higher number over a lower, 110/72, for example. It is stated as "110 over 72." The ideal is below 120 for the top number (systolic) and under 80 for the bottom (diastolic). Write down your blood pressure today. You should pay close attention to your blood pressure if you have certain conditions such as:  Heart failure.  Prior heart attack.  Diabetes  Chronic kidney disease.  Prior stroke.  Multiple risk factors for heart disease. To see if you have HTN, your blood pressure should be measured while you are seated with your arm held at the level of the heart. It should be measured at least twice. A one-time elevated blood pressure reading (especially in the Emergency Department) does not mean that you need treatment. There may be conditions in which the blood pressure is different between your right and left arms. It is important to see your caregiver soon for a recheck. Most people have essential hypertension which means that there is not a specific cause. This type of high blood pressure may be lowered by changing lifestyle factors such as:  Stress.  Smoking.  Lack of exercise.  Excessive weight.  Drug/tobacco/alcohol use.  Eating less salt. Most people do not have symptoms from high blood pressure until it has caused damage to the body. Effective treatment can often prevent, delay or reduce that damage. TREATMENT  When a cause has been identified, treatment for high blood pressure is directed at the cause. There are a large number of  medications to treat HTN. These fall into several categories, and your caregiver will help you select the medicines that are best for you. Medications may have side effects. You should review side effects with your caregiver. If your blood pressure stays high after you have made lifestyle changes or started on medicines,   Your medication(s) may need to be changed.  Other problems may need to be addressed.  Be certain you understand your prescriptions, and know how and when to take your medicine.  Be sure to follow up with your caregiver within the time frame advised (usually within two weeks) to have your blood pressure rechecked and to review your medications.  If you are taking more than one medicine to lower your blood pressure, make sure you know how and at what times they should be taken. Taking two medicines at the same time can result in blood pressure that is too low. SEEK IMMEDIATE MEDICAL CARE IF:  You develop a severe headache, blurred or changing vision, or confusion.  You have unusual weakness or numbness, or a faint feeling.  You have severe chest or abdominal pain, vomiting, or breathing problems. MAKE SURE YOU:   Understand these instructions.  Will watch your condition.  Will get help right away if you are not doing well or get worse. Document Released: 09/27/2005 Document Revised: 12/20/2011 Document Reviewed: 05/17/2008 [facility] Patient Information 2014 [place].  Urinary Incontinence Urinary incontinence is the involuntary loss of urine from your bladder. CAUSES  There are many causes of urinary incontinence. They include:  Medicines.  Infections.  Prostatic enlargement, leading to overflow of urine from your bladder.  Surgery.  Neurological diseases.  Emotional factors. SIGNS AND SYMPTOMS Urinary Incontinence can be divided into four types: 1. Urge incontinence. Urge incontinence is the involuntary loss of urine before you have the  opportunity to go to the bathroom. There is a sudden urge to void but not enough time to reach  a bathroom. 2. Stress incontinence. Stress incontinence is the sudden loss of urine with any activity that forces urine to pass. It is commonly caused by anatomical changes to the pelvis and sphincter areas of your body. 3. Overflow incontinence. Overflow incontinence is the loss of urine from an obstructed opening to your bladder. This results in a backup of urine and a resultant buildup of pressure within the bladder. When the pressure within the bladder exceeds the closing pressure of the sphincter, the urine overflows, which causes incontinence, similar to water overflowing a dam. 4. Total incontinence. Total incontinence is the loss of urine as a result of the inability to store urine within your bladder. DIAGNOSIS  Evaluating the cause of incontinence may require:  A thorough and complete medical and obstetric history.  A complete physical exam.  Laboratory tests such as a urine culture and sensitivities. When additional tests are indicated, they can include:  An ultrasound exam.  Kidney and bladder X-rays.  Cystoscopy. This is an exam of the bladder using a narrow scope.  Urodynamic testing to test the nerve function to the bladder and sphincter areas. TREATMENT  Treatment for urinary incontinence depends on the cause:  For urge incontinence caused by a bacterial infection, antibiotics will be prescribed. If the urge incontinence is related to medicines you take, your health care provider may have you change the medicine.  For stress incontinence, surgery to re-establish anatomical support to the bladder or sphincter, or both, will often correct the condition.  For overflow incontinence caused by an enlarged prostate, an operation to open the channel through the enlarged prostate will allow the flow of urine out of the bladder. In women with fibroids, a hysterectomy may be  recommended.  For total incontinence, surgery on your urinary sphincter may help. An artificial urinary sphincter (an inflatable cuff placed around the urethra) may be required. In women who have developed a hole-like passage between their bladder and vagina (vesicovaginal fistula), surgery to close the fistula often is required. HOME CARE INSTRUCTIONS  Normal daily hygiene and the use of pads or adult diapers that are changed regularly will help prevent odors and skin damage.  Avoid caffeine. It can overstimulate your bladder.  Use the bathroom regularly. Try about every 2 3 hours to go to the bathroom, even if you do not feel the need to do so. Take time to empty your bladder completely. After urinating, wait a minute. Then try to urinate again.  For causes involving nerve dysfunction, keep a log of the medicines you take and a journal of the times you go to the bathroom. SEEK MEDICAL CARE IF:  You experience worsening of pain instead of improvement in pain after your procedure.  Your incontinence becomes worse instead of better. SEE IMMEDIATE MEDICAL CARE IF:  You experience fever or shaking chills.  You are unable to pass your urine.  You have redness spreading into your groin or down into your thighs. MAKE SURE YOU:   Understand these instructions.   Will watch your condition.  Will get help right away if you are not doing well or get worse. Document Released: 11/04/2004 Document Revised: 07/18/2013 Document Reviewed: 03/06/2013 [facility] Patient Information 2014 [place]. Acetaminophen; Hydrocodone tablets or capsules What is this medicine? ACETAMINOPHEN; HYDROCODONE (a set a MEE noe fen; hye droe KOE done) is a pain reliever. It is used to treat mild to moderate pain. This medicine may be used for other purposes; ask your health care provider or pharmacist  if you have questions. COMMON BRAND NAME(S): Anexsia, Bancap HC , Ceta-Plus, Co-Gesic, Comfortpak , Dolagesic,  [employer] [facility], DuoCet , Hydrocet , Hydrogesic, Lorcet HD, Lorcet Plus, Lorcet, [place], Margesic H, Maxidone, [place], Polygesic, [place], [place], Vicodin ES, Vicodin HP, Vicodin, Charlane Ferretti What should I tell my health care provider before I take this medicine? They need to know if you have any of these conditions: -brain tumor -Crohn's disease, inflammatory bowel disease, or ulcerative colitis -drug abuse or addiction -head injury -heart or circulation problems -if you often drink alcohol -kidney disease or problems going to the bathroom -liver disease -lung disease, asthma, or breathing problems -an unusual or allergic reaction to acetaminophen, hydrocodone, other opioid analgesics, other medicines, foods, dyes, or preservatives -pregnant or trying to get pregnant -breast-feeding How should I use this medicine? Take this medicine by mouth. Swallow it with a full glass of water. Follow the directions on the prescription label. If the medicine upsets your stomach, take the medicine with food or milk. Do not take more than you are told to take. Talk to your pediatrician regarding the use of this medicine in children. This medicine is not approved for use in children. Overdosage: If you think you have taken too much of this medicine contact a poison control center or emergency room at once. NOTE: This medicine is only for you. Do not share this medicine with others. What if I miss a dose? If you miss a dose, take it as soon as you can. If it is almost time for your next dose, take only that dose. Do not take double or extra doses. What may interact with this medicine? -alcohol -antihistamines -isoniazid -medicines for depression, anxiety, or psychotic disturbances -medicines for sleep -muscle relaxants -naltrexone -narcotic medicines (opiates) for pain -phenobarbital -ritonavir -tramadol This list may not describe all possible interactions. Give your health care provider a list of  all the medicines, herbs, non-prescription drugs, or dietary supplements you use. Also tell them if you smoke, drink alcohol, or use illegal drugs. Some items may interact with your medicine. What should I watch for while using this medicine? Tell your doctor or health care professional if your pain does not go away, if it gets worse, or if you have new or a different type of pain. You may develop tolerance to the medicine. Tolerance means that you will need a higher dose of the medicine for pain relief. Tolerance is normal and is expected if you take the medicine for a long time. Do not suddenly stop taking your medicine because you may develop a severe reaction. Your body becomes used to the medicine. This does NOT mean you are addicted. Addiction is a behavior related to getting and using a drug for a non-medical reason. If you have pain, you have a medical reason to take pain medicine. Your doctor will tell you how much medicine to take. If your doctor wants you to stop the medicine, the dose will be slowly lowered over time to avoid any side effects. You may get drowsy or dizzy when you first start taking the medicine or change doses. Do not drive, use machinery, or do anything that may be dangerous until you know how the medicine affects you. Stand or sit up slowly. There are different types of narcotic medicines (opiates) for pain. If you take more than one type at the same time, you may have more side effects. Give your health care provider a list of all medicines you use. Your doctor will  tell you how much medicine to take. Do not take more medicine than directed. Call emergency for help if you have problems breathing. The medicine will cause constipation. Try to have a bowel movement at least every 2 to 3 days. If you do not have a bowel movement for 3 days, call your doctor or health care professional. Too much acetaminophen can be very dangerous. Do not take Tylenol (acetaminophen) or medicines  that contain acetaminophen with this medicine. Many non-prescription medicines contain acetaminophen. Always read the labels carefully. What side effects may I notice from receiving this medicine? Side effects that you should report to your doctor or health care professional as soon as possible: -allergic reactions like skin rash, itching or hives, swelling of the face, lips, or tongue -breathing problems -confusion -feeling faint or lightheaded, falls -stomach pain -yellowing of the eyes or skin Side effects that usually do not require medical attention (report to your doctor or health care professional if they continue or are bothersome): -nausea, vomiting -stomach upset This list may not describe all possible side effects. Call your doctor for medical advice about side effects. You may report side effects to FDA at 1-800-FDA-1088. Where should I keep my medicine? Keep out of the reach of children. This medicine can be abused. Keep your medicine in a safe place to protect it from theft. Do not share this medicine with anyone. Selling or giving away this medicine is dangerous and against the law. Store at room temperature between 15 and 30 degrees C (59 and 86 degrees F). Protect from light. Keep container tightly closed.  Throw away any unused medicine after the expiration date. Discard unused medicine and used packaging carefully. Pets and children can be harmed if they find used or lost packages. NOTE: This sheet is a summary. It may not cover all possible information. If you have questions about this medicine, talk to your doctor, pharmacist, or health care provider.  2014, Elsevier/Gold Standard. (2013-05-21 13:15:56)

## 2014-02-05 NOTE — Assessment & Plan Note (Signed)
ddx bursitis vs degenerative changes as previously noted  Xray today with degenerative changes  Referred to [facility] for possible steroid inj if needed. appt 02/14/14  Will Rx Vicodin 5-325 q 6 hours prn #120 no refills

## 2014-02-05 NOTE — Assessment & Plan Note (Signed)
BP Readings from Last 3 Encounters:  02/05/14 137/75  12/25/13 130/74  08/20/13 152/102    Lab Results  Component Value Date   NA 140 06/16/2012   K 4.3 06/16/2012   CREATININE 0.79 06/16/2012    Assessment: Blood pressure control: controlled Progress toward BP goal:  at goal Comments: none  Plan: Medications:  continue current medications; Rx refill Lisinopril today  Educational resources provided: other (see comments) Self management tools provided: home blood pressure logbook;other (see comments) Other plans: will check CMET today
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
## 2014-02-05 NOTE — Assessment & Plan Note (Signed)
>>  ASSESSMENT AND PLAN FOR DEGENERATIVE JOINT DISEASE OF SHOULDER, RIGHT WRITTEN ON 02/05/2014 11:43 AM BY MCLEAN, TRACY N  ddx bursitis vs degenerative changes as previously noted  Xray today with degenerative changes  Referred to [facility] for possible steroid inj if needed. appt 02/14/14  Will Rx Vicodin 5-325 q 6 hours prn #120 no refills

## 2014-02-06 ENCOUNTER — Encounter: Payer: Self-pay | Admitting: Internal Medicine

## 2014-02-06 LAB — URINALYSIS, ROUTINE W REFLEX MICROSCOPIC
BILIRUBIN URINE: NEGATIVE
GLUCOSE, UA: NEGATIVE mg/dL
Hgb urine dipstick: NEGATIVE
Ketones, ur: NEGATIVE mg/dL
Nitrite: NEGATIVE
PH: 6 (ref 5.0–8.0)
Protein, ur: NEGATIVE mg/dL
SPECIFIC GRAVITY, URINE: 1.009 (ref 1.005–1.030)
Urobilinogen, UA: 0.2 mg/dL (ref 0.0–1.0)

## 2014-02-06 LAB — URINALYSIS, MICROSCOPIC ONLY
Bacteria, UA: NONE SEEN
CASTS: NONE SEEN
CRYSTALS: NONE SEEN
SQUAMOUS EPITHELIAL / LPF: NONE SEEN

## 2014-02-06 NOTE — Progress Notes (Signed)
INTERNAL MEDICINE TEACHING ATTENDING ADDENDUM - Cressida Milford, MD: I reviewed and discussed at the time of visit with the resident Dr. McLean, the patient's medical history, physical examination, diagnosis and results of tests and treatment and I agree with the patient's care as documented. 

## 2014-02-14 ENCOUNTER — Ambulatory Visit (INDEPENDENT_AMBULATORY_CARE_PROVIDER_SITE_OTHER): Payer: Medicare Other | Admitting: Sports Medicine

## 2014-02-14 ENCOUNTER — Encounter: Payer: Self-pay | Admitting: Sports Medicine

## 2014-02-14 VITALS — BP 164/91 | Ht 68.0 in | Wt 303.0 lb

## 2014-02-14 DIAGNOSIS — M25519 Pain in unspecified shoulder: Secondary | ICD-10-CM

## 2014-02-14 DIAGNOSIS — M25511 Pain in right shoulder: Secondary | ICD-10-CM

## 2014-02-14 NOTE — Assessment & Plan Note (Signed)
Acute pain with evidence of advanced osteoarthritis on imaging now improving minimally most likely due to exacerbation of osteoarthritis.  - We offered joint injection but she declines - recommended pendulum exercises and wall climbs, handout given and reviewed in detail - increase NSAID slightly to 400 mg ibuprofen TID from current BID - f/u 3 weeks, will re-visit possibility of joint injection if no improvement at that time.

## 2014-02-14 NOTE — Assessment & Plan Note (Signed)
>>  ASSESSMENT AND PLAN FOR DEGENERATIVE JOINT DISEASE OF SHOULDER, RIGHT WRITTEN ON 02/14/2014 11:19 AM BY BRADSHAW, SAMUEL L, MD  Acute pain with evidence of advanced osteoarthritis on imaging now improving minimally most likely due to exacerbation of osteoarthritis.  - We offered joint injection but she declines - recommended pendulum exercises and wall climbs, handout given and reviewed in detail - increase NSAID slightly to 400 mg ibuprofen TID from current BID - f/u 3 weeks, will re-visit possibility of joint injection if no improvement at that time.

## 2014-02-14 NOTE — Patient Instructions (Signed)
Great to meet you!  Your shoulder pain is due to arthritis  Do the exercises we talked about 2-3 times a day.   Increase you advil to 2 pills three times a day  Come back in 3-4 weeks.
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
## 2014-02-18 ENCOUNTER — Encounter: Payer: Self-pay | Admitting: Dietician

## 2014-02-18 ENCOUNTER — Ambulatory Visit (INDEPENDENT_AMBULATORY_CARE_PROVIDER_SITE_OTHER): Payer: Medicare Other | Admitting: Dietician

## 2014-02-18 NOTE — Progress Notes (Signed)
Patient Identified Concern:  Low energy, diabetes risk, "eating too much" Stage of Change Patient Is In: preparation stage Patient Reported Barriers:  Dislike of vegetables, limited transportation Patient Reported Perceived Benefits:  More energy to do what she wants to do, softer stools, increased exercise tolerance, less knee and foot pain Patient Reports Self-Efficacy:   Low/medium Behavior Change Supports:  Office visits Goals:  Eat twice as may fruits and vegetables as starchy and fatty foods Patient Education:  Simple five exercises, local exercsie programs that are free, plate method of meal planning

## 2014-02-18 NOTE — Patient Instructions (Addendum)
To feel better, have more energy you said you want to:   You said you are going to eat  More fruit and vegetables instead of starchy fatty foods like grits, rice, cornbread, candy bars.

## 2014-03-05 ENCOUNTER — Encounter: Payer: Self-pay | Admitting: Dietician

## 2014-03-05 ENCOUNTER — Ambulatory Visit (INDEPENDENT_AMBULATORY_CARE_PROVIDER_SITE_OTHER): Payer: Medicare Other | Admitting: Dietician

## 2014-03-05 NOTE — Progress Notes (Signed)
Patient Identified Concern:(from last visit)  Low energy improving, diabetes risk discussed- Most recent A1C- 6.1%, "eating too much" also improving Stage of Change Patient Is In: Action stage- weight decreased ~ 4 #, eating less bread, starch, candy and more fruits and vegetables. Patient Reported Barriers:  Struggling with her dislike of a wider variety of vegetables Patient Reported Perceived Benefits:  More energy to do what she wants to do, softer stools, increased exercise tolerance, less knee and foot pain Patient Reports Self-Efficacy:  Reports it as being high, but feel her self efficacy is medium.high. Reports lonliness Behavior Change Supports:  Office visits, need to discuss self management support at future visit.  Goals:  Increase exercise, continue to work on well balanced meals/increased variety of  vegetables Patient Education:  About A1C levels and simple well balanced frozen meals

## 2014-03-05 NOTE — Patient Instructions (Addendum)
Please make a follow up appointment for 3-4 weeks  Next visit i look forward to hearing about [facility]./Exercise

## 2014-03-06 ENCOUNTER — Encounter: Payer: Self-pay | Admitting: Sports Medicine

## 2014-03-06 ENCOUNTER — Ambulatory Visit (INDEPENDENT_AMBULATORY_CARE_PROVIDER_SITE_OTHER): Payer: Medicare Other | Admitting: Sports Medicine

## 2014-03-06 VITALS — BP 152/86 | Ht 68.0 in | Wt 232.0 lb

## 2014-03-06 DIAGNOSIS — M75101 Unspecified rotator cuff tear or rupture of right shoulder, not specified as traumatic: Secondary | ICD-10-CM

## 2014-03-06 DIAGNOSIS — M719 Bursopathy, unspecified: Secondary | ICD-10-CM

## 2014-03-06 DIAGNOSIS — M259 Joint disorder, unspecified: Secondary | ICD-10-CM | POA: Diagnosis not present

## 2014-03-06 DIAGNOSIS — M19019 Primary osteoarthritis, unspecified shoulder: Secondary | ICD-10-CM

## 2014-03-06 DIAGNOSIS — M67919 Unspecified disorder of synovium and tendon, unspecified shoulder: Secondary | ICD-10-CM

## 2014-03-06 DIAGNOSIS — M25519 Pain in unspecified shoulder: Secondary | ICD-10-CM | POA: Diagnosis not present

## 2014-03-06 NOTE — Progress Notes (Signed)
CC: Followup right shoulder pain HPI: Patient is a very pleasant 78 year old female who presents for followup of right shoulder pain. She states that she is only a little that better than last time. She did increase her growth and from 400 mg twice a day to 3 times a day. She does note improvement but still has difficulties and limitations in doing the things she wants to do. She notes pain with overhead movement and things like lifting objects, washing her hair, and even reaching out to turn a doorknob. She has been working on the exercises as well.  ROS: As above in the HPI. All other systems are stable or negative.  OBJECTIVE: APPEARANCE:  Patient in no acute distress.The patient appeared well nourished and normally developed. HEENT: No scleral icterus. Conjunctiva non-injected Resp: Non labored Skin: No rash MSK:   Right shoulder: Passively I can get her to 120 of forward flexion and 90 of abduction before she fully guards due to pain. She can achieve 70 of external rotation without pain passively Actively she can get 100 of forward flexion, 80 abduction, 60 external rotation. Tender over the a.c. joint of the right shoulder Strength is 4+ out of 5 on external rotation Strength 4/5 on empty can limited by pain Severe pain with Hawkin's test  MSK [place]: Not performed   ASSESSMENT: #1. Right shoulder pain secondary to rotator cuff syndrome and possible partial rotator cuff tear with severe a.c. joint arthropathy   PLAN: Discuss treatment options with the patient. I did recommend a subacromial injection today. However, patient would like to try to avoid this if possible. We will instead refer her to physical therapy for rotator cuff exercises as well as consideration of modalities such as iontophoresis for her a.c. joint pain.

## 2014-03-06 NOTE — Patient Instructions (Signed)
Thank you for coming in today  We will refer you to physical therapy Increase advil to 3 tablets up to 3 times per day Followup if you want an injection

## 2014-03-07 ENCOUNTER — Ambulatory Visit: Payer: Medicare Other | Admitting: Sports Medicine

## 2014-03-26 ENCOUNTER — Encounter: Payer: Self-pay | Admitting: Dietician

## 2014-03-26 ENCOUNTER — Ambulatory Visit (INDEPENDENT_AMBULATORY_CARE_PROVIDER_SITE_OTHER): Payer: Medicare Other | Admitting: Dietician

## 2014-03-26 NOTE — Patient Instructions (Signed)
Please make a follow up appointment for 3 weeks.   You amy wish to consider a TOPS meeting at [facility] for weekly support.

## 2014-03-26 NOTE — Progress Notes (Signed)
Patient Identified Concern:(from last visit)  Low energy lately, thinking about how to address "eating too many sweets"  Stage of Change Patient Is In: planning/Action stages- signed up for [facility], planning on starting chair ae  robics or water exercise next week.  weight decreased ~ 1# overall, increased 3# form last visit, Patient Reported Barriers: Her children bring her sweets, lonliness and lack of support for making healthy lifestyle changes Patient Reported Perceived Benefits:  More energy to do what she wants to do, softer stools, increased exercise tolerance, less knee and foot pain Patient Reports Self-Efficacy:  Reports it as being moderate today.  Behavior Change Supports:  Office visits, children especially one daughter. Discussed importance of behavior change support, provided her with TOPS meeting schedule and contacts at [facility]  Goals:  ask children to bring fruits rather than sweets, Increase exercise, continue to work on well balanced meals/increased variety of  vegetables Patient Education:  How to develop healhtier food and activity cues

## 2014-04-01 ENCOUNTER — Ambulatory Visit: Payer: Medicare Other | Attending: Family Medicine

## 2014-04-01 DIAGNOSIS — R5381 Other malaise: Secondary | ICD-10-CM | POA: Insufficient documentation

## 2014-04-01 DIAGNOSIS — IMO0001 Reserved for inherently not codable concepts without codable children: Secondary | ICD-10-CM | POA: Insufficient documentation

## 2014-04-01 DIAGNOSIS — M25519 Pain in unspecified shoulder: Secondary | ICD-10-CM | POA: Insufficient documentation

## 2014-04-09 ENCOUNTER — Ambulatory Visit: Payer: Medicare Other | Admitting: Physical Therapy

## 2014-04-09 ENCOUNTER — Encounter: Payer: Self-pay | Admitting: Sports Medicine

## 2014-04-09 ENCOUNTER — Ambulatory Visit (INDEPENDENT_AMBULATORY_CARE_PROVIDER_SITE_OTHER): Payer: Medicare Other | Admitting: Sports Medicine

## 2014-04-09 VITALS — BP 165/84 | Ht 68.0 in | Wt 290.0 lb

## 2014-04-09 DIAGNOSIS — M719 Bursopathy, unspecified: Secondary | ICD-10-CM | POA: Diagnosis not present

## 2014-04-09 DIAGNOSIS — R5381 Other malaise: Secondary | ICD-10-CM | POA: Diagnosis not present

## 2014-04-09 DIAGNOSIS — M67919 Unspecified disorder of synovium and tendon, unspecified shoulder: Secondary | ICD-10-CM

## 2014-04-09 DIAGNOSIS — M25519 Pain in unspecified shoulder: Secondary | ICD-10-CM | POA: Diagnosis not present

## 2014-04-09 DIAGNOSIS — IMO0001 Reserved for inherently not codable concepts without codable children: Secondary | ICD-10-CM | POA: Diagnosis not present

## 2014-04-09 DIAGNOSIS — M25511 Pain in right shoulder: Secondary | ICD-10-CM

## 2014-04-09 DIAGNOSIS — M75101 Unspecified rotator cuff tear or rupture of right shoulder, not specified as traumatic: Secondary | ICD-10-CM

## 2014-04-09 MED ORDER — METHYLPREDNISOLONE ACETATE 40 MG/ML IJ SUSP
40.0000 mg | Freq: Once | INTRAMUSCULAR | Status: AC
  Administered 2014-04-09: 40 mg via INTRA_ARTICULAR

## 2014-04-09 MED ORDER — IBUPROFEN 600 MG PO TABS: 600.0000 mg | ORAL_TABLET | Freq: Four times a day (QID) | ORAL | Status: DC | PRN

## 2014-04-09 NOTE — Addendum Note (Signed)
Addended by: Cyd Silence on: 04/09/2014 09:59 AM   Modules accepted: Orders

## 2014-04-09 NOTE — Addendum Note (Signed)
Addended by: Cyd Silence on: 04/09/2014 09:40 AM   Modules accepted: Orders

## 2014-04-09 NOTE — Progress Notes (Addendum)
   Subjective:    Patient ID: Christine [name], female    DOB: 1936-09-05, 78 y.o.   MRN: [id number]  HPI Patient comes in today for followup on right shoulder pain. Still struggling with pain with simple activities of daily living. Severely limited range of motion secondary to pain. She has attended a single physical therapy visit and is using ibuprofen. Symptoms have been present now for a little over 2 months. No known trauma. Pain is diffuse in the shoulder. Recent x-rays showed some mild glenohumeral DJD and advanced a.c. DJD.    Review of Systems     Objective:   Physical Exam Obese. No acute distress.  Right shoulder: Limited range of motion in all planes secondary to pain. Passively she is only able to get to 45 of forward flexion. Passively I can carry her to 90. Global weakness secondary to pain. Skin is intact with normal temperature normal color. Neurovascularly intact distally.  X-rays are as above       Assessment & Plan:  Persistent right shoulder pain secondary to DJD versus rotator cuff tendinopathy versus rotator cuff tear  Patient's right subacromial space was injected today with cortisone after risks and benefits were explained. Patient tolerated this without difficulty. She'll continue with physical therapy and followup with me in 3 weeks. If symptoms persist at followup I'll consider merits of further diagnostic imaging in the form of an MRI. Her body habitus would make ultrasound evaluation very difficult. I will refill her ibuprofen as well.  Consent obtained and verified. Time-out conducted. Noted no overlying erythema, induration, or other signs of local infection. Skin prepped in a sterile fashion. Topical analgesic spray: Ethyl chloride. Joint: right subacromial space Needle: 25g 1.5 inch Completed without difficulty. Meds: 3cc 1% xylocaine, 1cc (40mg ) depomedrol  Advised to call if fevers/chills, erythema, induration, drainage, or persistent  bleeding.

## 2014-04-11 ENCOUNTER — Ambulatory Visit: Payer: Medicare Other | Attending: Family Medicine | Admitting: Physical Therapy

## 2014-04-11 DIAGNOSIS — IMO0001 Reserved for inherently not codable concepts without codable children: Secondary | ICD-10-CM | POA: Insufficient documentation

## 2014-04-11 DIAGNOSIS — M25519 Pain in unspecified shoulder: Secondary | ICD-10-CM | POA: Diagnosis not present

## 2014-04-11 DIAGNOSIS — R5381 Other malaise: Secondary | ICD-10-CM | POA: Insufficient documentation

## 2014-04-15 ENCOUNTER — Other Ambulatory Visit: Payer: Self-pay | Admitting: *Deleted

## 2014-04-15 ENCOUNTER — Ambulatory Visit: Payer: Medicare Other

## 2014-04-15 DIAGNOSIS — IMO0001 Reserved for inherently not codable concepts without codable children: Secondary | ICD-10-CM | POA: Diagnosis not present

## 2014-04-15 MED ORDER — TRAMADOL HCL 50 MG PO TABS: 50.0000 mg | ORAL_TABLET | Freq: Three times a day (TID) | ORAL | Status: DC

## 2014-04-16 ENCOUNTER — Other Ambulatory Visit: Payer: Self-pay | Admitting: *Deleted

## 2014-04-16 DIAGNOSIS — E785 Hyperlipidemia, unspecified: Secondary | ICD-10-CM

## 2014-04-16 MED ORDER — TRAMADOL HCL 50 MG PO TABS: 50.0000 mg | ORAL_TABLET | Freq: Three times a day (TID) | ORAL | Status: DC

## 2014-04-16 MED ORDER — ATORVASTATIN CALCIUM 40 MG PO TABS: ORAL_TABLET | ORAL | Status: DC

## 2014-04-17 ENCOUNTER — Other Ambulatory Visit: Payer: Self-pay | Admitting: Internal Medicine

## 2014-04-17 DIAGNOSIS — L298 Other pruritus: Secondary | ICD-10-CM

## 2014-04-18 ENCOUNTER — Ambulatory Visit: Payer: Medicare Other | Admitting: Physical Therapy

## 2014-04-18 DIAGNOSIS — IMO0001 Reserved for inherently not codable concepts without codable children: Secondary | ICD-10-CM | POA: Diagnosis not present

## 2014-04-29 ENCOUNTER — Ambulatory Visit: Payer: Medicare Other | Admitting: Dietician

## 2014-04-30 ENCOUNTER — Encounter: Payer: Self-pay | Admitting: Sports Medicine

## 2014-04-30 ENCOUNTER — Ambulatory Visit (INDEPENDENT_AMBULATORY_CARE_PROVIDER_SITE_OTHER): Payer: Medicare Other | Admitting: Sports Medicine

## 2014-04-30 VITALS — BP 130/80 | Ht 68.0 in | Wt 290.0 lb

## 2014-04-30 DIAGNOSIS — M67919 Unspecified disorder of synovium and tendon, unspecified shoulder: Secondary | ICD-10-CM

## 2014-04-30 DIAGNOSIS — M719 Bursopathy, unspecified: Secondary | ICD-10-CM

## 2014-04-30 DIAGNOSIS — M75101 Unspecified rotator cuff tear or rupture of right shoulder, not specified as traumatic: Secondary | ICD-10-CM

## 2014-04-30 NOTE — Progress Notes (Signed)
   Subjective:    Patient ID: Christine [name], female    DOB: [birth year], 78 y.o.   MRN: [id number]  HPI Patient comes in today for followup on right shoulder pain. Pain has improved with recent subacromial cortisone injection. Although she has not regained full function of her shoulder she is happy with her progress to date. She has attended physical therapy as well.    Review of Systems     Objective:   Physical Exam Well-developed and well-nourished. No acute distress  Right shoulder: Patient still shows limited active forward flexion and abduction. Actively she is only able to get to about 67. Still some global weakness with strength testing. Neurovascularly intact distally.  Recent x-rays of the right shoulder showed mild glenohumeral DJD and a.c. DJD      Assessment & Plan:  Improving right shoulder pain secondary to DJD versus rotator cuff tear  I discussed the patient's options with her including watchful waiting versus further diagnostic imaging. She is happy giving this a little more time even though she has not regained full function of this right shoulder. She understands that if her improvement plateaus or her symptoms worsen then I want her to call me at which point I would consider an MRI of her right shoulder. We could also consider a repeat cortisone injection down the road if she experiences prolonged symptom relief. For now we will leave things open-ended to followup as needed.

## 2014-05-01 ENCOUNTER — Ambulatory Visit: Payer: Medicare Other | Admitting: Dietician

## 2014-05-09 ENCOUNTER — Other Ambulatory Visit: Payer: Self-pay | Admitting: *Deleted

## 2014-05-09 DIAGNOSIS — L219 Seborrheic dermatitis, unspecified: Secondary | ICD-10-CM

## 2014-05-09 MED ORDER — FLUOCINOLONE ACETONIDE SCALP 0.01 % EX OIL: TOPICAL_OIL | CUTANEOUS | Status: DC

## 2014-05-09 MED ORDER — KETOCONAZOLE 2 % EX SHAM: 1.0000 "application " | MEDICATED_SHAMPOO | CUTANEOUS | Status: DC

## 2014-06-06 NOTE — Addendum Note (Signed)
Addended by: Hulan Fray on: 06/06/2014 05:53 PM   Modules accepted: Orders

## 2014-07-08 ENCOUNTER — Telehealth: Payer: Self-pay | Admitting: Licensed Clinical Social Worker

## 2014-07-08 NOTE — Telephone Encounter (Signed)
Christine [name] left message with CSW on 07/05/14 at 1545.  CSW placed called to pt.  CSW left message requesting return call. CSW provided contact hours and phone number.

## 2014-07-16 NOTE — Telephone Encounter (Signed)
CSW returned call to Ms. Grajales.  Pt states [facility] will)  "...charge like other doctor offices".  Pt inquiring as to why she received an invoice for her services at [place].  Pt states she never received a bill in the past from [facility].  CSW notified  Ms. Tillis of the potential for copayments for physician appointments.  [facility] will attempt to collect co-payments but will not turn patients away due to inability to pay.  CSW encouraged pt to continue to keep appointments and pay what she can towards her bill.

## 2014-07-25 ENCOUNTER — Other Ambulatory Visit: Payer: Self-pay | Admitting: Internal Medicine

## 2014-08-26 ENCOUNTER — Telehealth: Payer: Self-pay | Admitting: *Deleted

## 2014-08-26 ENCOUNTER — Other Ambulatory Visit: Payer: Self-pay | Admitting: *Deleted

## 2014-08-26 DIAGNOSIS — E785 Hyperlipidemia, unspecified: Secondary | ICD-10-CM

## 2014-08-26 MED ORDER — ATORVASTATIN CALCIUM 40 MG PO TABS: 40.0000 mg | ORAL_TABLET | Freq: Every day | ORAL | Status: DC

## 2014-08-26 NOTE — Telephone Encounter (Signed)
Pt called needs refill on generic Lipitor and pt is requesting #[phone]. Uses [facility]. Hilda Blades Kalim Kissel RN 08/26/14 12:30PM

## 2014-09-23 ENCOUNTER — Encounter: Payer: Self-pay | Admitting: Internal Medicine

## 2014-09-23 ENCOUNTER — Ambulatory Visit (INDEPENDENT_AMBULATORY_CARE_PROVIDER_SITE_OTHER): Payer: Medicare Other | Admitting: Internal Medicine

## 2014-09-23 VITALS — BP 144/57 | HR 100 | Temp 97.6°F | Ht 68.4 in | Wt 304.3 lb

## 2014-09-23 DIAGNOSIS — E119 Type 2 diabetes mellitus without complications: Secondary | ICD-10-CM

## 2014-09-23 DIAGNOSIS — I10 Essential (primary) hypertension: Secondary | ICD-10-CM

## 2014-09-23 DIAGNOSIS — M19011 Primary osteoarthritis, right shoulder: Secondary | ICD-10-CM | POA: Diagnosis not present

## 2014-09-23 DIAGNOSIS — L219 Seborrheic dermatitis, unspecified: Secondary | ICD-10-CM | POA: Diagnosis not present

## 2014-09-23 DIAGNOSIS — R7303 Prediabetes: Secondary | ICD-10-CM

## 2014-09-23 DIAGNOSIS — R7309 Other abnormal glucose: Secondary | ICD-10-CM | POA: Diagnosis not present

## 2014-09-23 HISTORY — DX: Type 2 diabetes mellitus without complications: E11.9

## 2014-09-23 MED ORDER — TRAMADOL HCL 50 MG PO TABS: 50.0000 mg | ORAL_TABLET | Freq: Three times a day (TID) | ORAL | Status: DC

## 2014-09-23 MED ORDER — IBUPROFEN 600 MG PO TABS: 600.0000 mg | ORAL_TABLET | Freq: Four times a day (QID) | ORAL | Status: DC | PRN

## 2014-09-23 MED ORDER — KETOCONAZOLE 2 % EX SHAM: MEDICATED_SHAMPOO | CUTANEOUS | Status: DC

## 2014-09-23 MED ORDER — FLUOCINOLONE ACETONIDE 0.01 % EX SOLN: Freq: Two times a day (BID) | CUTANEOUS | Status: DC

## 2014-09-23 MED ORDER — FLUOCINONIDE 0.05 % EX OINT: 1.0000 "application " | TOPICAL_OINTMENT | Freq: Two times a day (BID) | CUTANEOUS | Status: DC

## 2014-09-23 NOTE — Patient Instructions (Signed)
General Instructions:   Please bring your medicines with you each time you come to clinic.  Medicines may include prescription medications, over-the-counter medications, herbal remedies, eye drops, vitamins, or other pills.  For your rash: -Use the ketoconazole shampoo  -Use the Fluocinonide solution and ointment  For your shoulder pain: -Follow up with Dr. Micheline Chapman for joint injection -Use Tramadol 50 mg for pain -Use Ibuprofen for pain  Seborrheic Dermatitis Seborrheic dermatitis involves pink or red skin with greasy, flaky scales. This is often found on the scalp, eyebrows, nose, bearded area, and on or behind the ears. It can also occur on the central chest. It often occurs where there are more oil (sebaceous) glands. This condition is also known as dandruff. When this condition affects a baby's scalp, it is called cradle cap. It may come and go for no known reason. It can occur at any time of life from infancy to old age. CAUSES  The cause is unknown. It is not the result of too little moisture or too much oil. In some people, seborrheic dermatitis flare-ups seem to be triggered by stress. It also commonly occurs in people with certain diseases such as Parkinson's disease or HIV/AIDS. SYMPTOMS   Thick scales on the scalp.  Redness on the face or in the armpits.  The skin may seem oily or dry, but moisturizers do not help.  In infants, seborrheic dermatitis appears as scaly redness that does not seem to bother the baby. In some babies, it affects only the scalp. In others, it also affects the neck creases, armpits, groin, or behind the ears.  In adults and adolescents, seborrheic dermatitis may affect only the scalp. It may look patchy or spread out, with areas of redness and flaking. Other areas commonly affected include:  Eyebrows.  Eyelids.  Forehead.  Skin behind the ears.  Outer ears.  Chest.  Armpits.  Nose creases.  Skin creases under the breasts.  Skin  between the buttocks.  Groin.  Some adults and adolescents feel itching or burning in the affected areas. DIAGNOSIS  Your caregiver can usually tell what the problem is by doing a physical exam. TREATMENT   Cortisone (steroid) ointments, creams, and lotions can help decrease inflammation.  Babies can be treated with baby oil to soften the scales, then they may be washed with baby shampoo. If this does not help, a prescription topical steroid medicine may work.  Adults can use medicated shampoos.  Your caregiver may prescribe corticosteroid cream and shampoo containing an antifungal or yeast medicine (ketoconazole). Hydrocortisone or anti-yeast cream can be rubbed directly onto seborrheic dermatitis patches. Yeast does not cause seborrheic dermatitis, but it seems to add to the problem. In infants, seborrheic dermatitis is often worst during the first year of life. It tends to disappear on its own as the child grows. However, it may return during the teenage years. In adults and adolescents, seborrheic dermatitis tends to be a long-lasting condition that comes and goes over many years. HOME CARE INSTRUCTIONS   Use prescribed medicines as directed.  In infants, do not aggressively remove the scales or flakes on the scalp with a comb or by other means. This may lead to hair loss. SEEK MEDICAL CARE IF:   The problem does not improve from the medicated shampoos, lotions, or other medicines given by your caregiver.  You have any other questions or concerns. Document Released: 09/27/2005 Document Revised: 03/28/2012 Document Reviewed: 02/16/2010 [facility] Patient Information 2015 [place], [place]. This information is not  intended to replace advice given to you by your health care provider. Make sure you discuss any questions you have with your health care provider.

## 2014-09-23 NOTE — Assessment & Plan Note (Signed)
-  Continue ketoconazole shampoo -Reordered Fluocinonide ointment and solution (insurance will not pay for oil).

## 2014-09-23 NOTE — Assessment & Plan Note (Signed)
HgbA1c 6.1 in April 2015.   -Consider rechecking hemoglobin A1C in about 4 months

## 2014-09-23 NOTE — Assessment & Plan Note (Signed)
BP Readings from Last 3 Encounters:  09/23/14 144/57  04/30/14 130/80  04/09/14 165/84    Lab Results  Component Value Date   NA 137 02/05/2014   K 4.0 02/05/2014   CREATININE 0.74 02/05/2014    Assessment: Blood pressure control:   Controlled Progress toward BP goal:    At goal Comments: No changes  Plan: Medications:  continue current medications Educational resources provided:  none Self management tools provided:  none Other plans: None

## 2014-09-23 NOTE — Progress Notes (Signed)
Subjective:    Patient ID: Christine [name], female    DOB: 1936/09/16, 78 y.o.   MRN: [id number]  HPI Ms. Salameh is a 78 yo female with PMHx of HTN, allergic rhinitis, GERD, lactose intolerance, OA, osteoporosis, and HLD who presents for follow up. Please see problem oriented assessment and plan for more information.  Review of Systems General: Admits to weight gain. Denies fever, chills, fatigue, and diaphoresis.  Respiratory: Denies SOB, cough   Cardiovascular: Denies chest pain and palpitations  Gastrointestinal: Denies nausea, vomiting, abdominal pain Endocrine: Denies hot or cold intolerance, polyuria, and polydipsia. Musculoskeletal: Admits to right shoulder pain (chronic). Denies myalgias, back pain, joint swelling, and gait problem.  Skin: Admits to rash. Denies pallor, and wounds.  Neurological: Denies dizziness, headaches, weakness, lightheadedness.  Past Medical History  Diagnosis Date  . Hyperlipemia   . Hypertension   . Seasonal allergies   . Cataract   . Thyroid nodule   . Depression   . Pruritus   . Morbidly obese   . Impaired glucose tolerance    Current Outpatient Prescriptions on File Prior to Visit  Medication Sig Dispense Refill  . atorvastatin (LIPITOR) 40 MG tablet Take 1 tablet (40 mg total) by mouth daily. 90 tablet 0  . citalopram (CELEXA) 40 MG tablet Take 1 tablet (40 mg total) by mouth daily. 30 tablet 3  . FLUOCINOLONE ACETONIDE SCALP 0.01 % OIL Apply to scalp every 6-12 hours as needed 1 Bottle 3  . HYDROcodone-acetaminophen (NORCO/VICODIN) 5-325 MG per tablet Take 1 tablet by mouth every 6 (six) hours as needed for moderate pain. 120 tablet 0  . hydrocortisone cream 1 % Apply to affected area no more than 2 times daily as needed for itching 30 g 1  . ibuprofen (ADVIL,MOTRIN) 600 MG tablet Take 1 tablet (600 mg total) by mouth every 6 (six) hours as needed. 30 tablet 1  . ketoconazole (NIZORAL) 2 % shampoo apply to affected area as directed TWICE A  WEEK 120 mL 0  . lactase (LACTAID) 3000 UNIT tablet Take 1 tablet before milk products     . lisinopril (PRINIVIL,ZESTRIL) 20 MG tablet Take 1 tablet (20 mg total) by mouth daily. 90 tablet 1  . loratadine (CLARITIN) 10 MG tablet take 1 tablet by mouth twice a day if needed for itching 180 tablet 1  . oxybutynin (DITROPAN) 5 MG tablet Take 1 tablet (5 mg total) by mouth 3 (three) times daily. 60 tablet 3  . sennosides-docusate sodium (SENOKOT-S) 8.6-50 MG tablet Take 2 tablets by mouth daily. STOP IF YOU ARE HAVING LOOSE STOOLS. 60 tablet 1  . traMADol (ULTRAM) 50 MG tablet Take 1 tablet (50 mg total) by mouth every 8 (eight) hours. 60 tablet 0  . [DISCONTINUED] oxybutynin (DITROPAN) 5 MG tablet TAKE 1 TABLET BY MOUTH TWICE A DAY 60 tablet 3   No current facility-administered medications on file prior to visit.      Objective:   Physical Exam Filed Vitals:   09/23/14 1031  BP: 144/57  Pulse: 100  Temp: 97.6 F (36.4 C)  TempSrc: Oral  Height: 5' 8.4" (1.737 m)  Weight: 304 lb 4.8 oz (138.03 kg)  SpO2: 100%   General: Vital signs reviewed.  Patient is well-developed and well-nourished, in no acute distress and cooperative with exam.  Cardiovascular: Tachycardic, regular rhythm, S1 normal, S2 normal, no murmurs, gallops, or rubs. Pulmonary/Chest: Clear to auscultation bilaterally, no wheezes, rales, or rhonchi. Abdominal: Soft, non-tender, obese, non-distended, BS +  Musculoskeletal: Limited range of motion in right shoulder, pain with active and passive motion, normal sensation, normal radial pulses. Tender on palpation of AC and GH joints. No erythema, increased warmth, rash, or swelling.  Extremities: No lower extremity edema bilaterallyrally Skin: Seborrheic dermatitis on scalp, back of neck (with hypopigmentation) and arms. Psychiatric: Normal mood and affect. speech and behavior is normal. Cognition and memory are normal.     Assessment & Plan:   Please see problem based  assessment and plan.

## 2014-09-23 NOTE — Assessment & Plan Note (Addendum)
Assessment: Degenerative Joint Disease versus Rotator cuff tendinopathy. Seen by Dr. Micheline Chapman in June and July of 2015. Patient received steroid injection which improved her pain greatly. At that visit, Dr. Micheline Chapman recommended repeat injection versus MRI in the future.   Plan: -Referral back to Dr. Micheline Chapman 10/09/14 for steroid injection and possible MRI -Refill Tramadol 50 mg Q8H prn -Refill Ibuprofen

## 2014-09-23 NOTE — Assessment & Plan Note (Signed)
>>  ASSESSMENT AND PLAN FOR DEGENERATIVE JOINT DISEASE OF SHOULDER, RIGHT WRITTEN ON 09/23/2014 11:49 AM BY Jill Alexanders, MD  Assessment: Degenerative Joint Disease versus Rotator cuff tendinopathy. Seen by Dr. Margaretha Sheffield in June and July of 2015. Patient received steroid injection which improved her pain greatly. At that visit, Dr. Margaretha Sheffield recommended repeat injection versus MRI in the future.   Plan: -Referral back to Dr. Margaretha Sheffield 10/09/14 for steroid injection and possible MRI -Refill Tramadol 50 mg Q8H prn -Refill Ibuprofen

## 2014-09-24 NOTE — Progress Notes (Signed)
Internal Medicine Clinic Attending  I saw and evaluated the patient.  I personally confirmed the key portions of the history and exam documented by Dr. Richardson and I reviewed pertinent patient test results.  The assessment, diagnosis, and plan were formulated together and I agree with the documentation in the resident's note. 

## 2014-10-09 ENCOUNTER — Ambulatory Visit (INDEPENDENT_AMBULATORY_CARE_PROVIDER_SITE_OTHER): Payer: Medicare Other | Admitting: Sports Medicine

## 2014-10-09 ENCOUNTER — Encounter: Payer: Self-pay | Admitting: Sports Medicine

## 2014-10-09 VITALS — BP 156/71 | Ht 68.0 in | Wt 314.0 lb

## 2014-10-09 DIAGNOSIS — M25561 Pain in right knee: Secondary | ICD-10-CM

## 2014-10-09 DIAGNOSIS — M75101 Unspecified rotator cuff tear or rupture of right shoulder, not specified as traumatic: Secondary | ICD-10-CM

## 2014-10-09 MED ORDER — METHYLPREDNISOLONE ACETATE 40 MG/ML IJ SUSP
40.0000 mg | Freq: Once | INTRAMUSCULAR | Status: AC
Start: 2014-10-09 — End: 2014-10-09
  Administered 2014-10-09: 40 mg via INTRA_ARTICULAR

## 2014-10-09 MED ORDER — METHYLPREDNISOLONE ACETATE 40 MG/ML IJ SUSP
40.0000 mg | Freq: Once | INTRAMUSCULAR | Status: AC
  Administered 2014-10-09: 40 mg via INTRA_ARTICULAR

## 2014-10-09 NOTE — Progress Notes (Signed)
   Subjective:    Patient ID: Christine [name], female    DOB: 04-Aug-1936, 78 y.o.   MRN: [id number]  HPI chief complaint: Right shoulder and right knee pain  Patient comes in today with returning right shoulder pain. She has had ongoing shoulder pain for quite some time. Subacromial cortisone injections do provide her with good symptom relief. This relieves her pain and allows her to increase her range of motion. Pain recently began to return. No recent trauma. She has pain with reaching directly overhead, around behind her back, and out away from her body. She is also complaining of new onset right knee pain. Again, no trauma. Pain is along the medial aspect of the knee and worse with weightbearing. She is getting intermittent swelling. No mechanical symptoms. She has a history of arthroscopy on the same knee some 7-8 years ago. No recent x-rays have been done.    Review of Systems     Objective:   Physical Exam Well-developed, well-nourished. No acute distress. Awake alert and oriented 3. Vital signs reviewed  Right shoulder: Patient has limited active range of motion in all planes. Fairly good passive range of motion. Global pain and weakness with rotator cuff stressing. Markedly positive signs of impingement including a positive Hawkins and positive empty can. No tenderness along the clavicle or ac joint. Neurovascular intact distally.  Right knee: Range of motion 0-110. No effusion. She is tender to palpation along the medial joint line but a negative McMurray's. No tenderness along the lateral joint line. Good ligamentous stability. Neurovascular intact distally. Walking with a limp.      Assessment & Plan:  1. Returning right shoulder pain secondary to rotator cuff tendinopathy versus rotator cuff tear 2. Right knee pain likely secondary to DJD  I recommended cortisone injections for both the right shoulder and the right knee. Right knee was injected utilizing an anterior medial  approach. Patient has received a good response previously to subacromial cortisone injections in her right shoulder. However, if she does not notice a significant increase in her pain or function after today's injection we will consider further diagnostic imaging in the form of an MRI. For the right knee I would get plain x-rays if today's cortisone injection does not help. Patient will follow-up with me for ongoing or recalcitrant issues.  Consent obtained and verified. Time-out conducted. Noted no overlying erythema, induration, or other signs of local infection. Skin prepped in a sterile fashion. Topical analgesic spray: Ethyl chloride. Joint: right shoulder (subacromial) Needle: 25g 1.5 inch Completed without difficulty. Meds: 3cc 1% xylocaine, 1cc (40mg ) depomedrol  Advised to call if fevers/chills, erythema, induration, drainage, or persistent bleeding.  Consent obtained and verified. Time-out conducted. Noted no overlying erythema, induration, or other signs of local infection. Skin prepped in a sterile fashion. Topical analgesic spray: Ethyl chloride. Joint: right knee Needle: 22g 1.5 inch Completed without difficulty. Meds: 3cc 1% xylocaine, 1cc (40mg ) depomedrol  Advised to call if fevers/chills, erythema, induration, drainage, or persistent bleeding.

## 2014-10-26 ENCOUNTER — Other Ambulatory Visit: Payer: Self-pay | Admitting: Internal Medicine

## 2014-11-12 ENCOUNTER — Ambulatory Visit (INDEPENDENT_AMBULATORY_CARE_PROVIDER_SITE_OTHER): Payer: Medicare Other | Admitting: Sports Medicine

## 2014-11-12 ENCOUNTER — Encounter: Payer: Self-pay | Admitting: Sports Medicine

## 2014-11-12 VITALS — BP 149/87 | HR 99 | Ht 68.0 in | Wt 310.0 lb

## 2014-11-12 DIAGNOSIS — M19011 Primary osteoarthritis, right shoulder: Secondary | ICD-10-CM

## 2014-11-12 MED ORDER — TRAMADOL HCL 50 MG PO TABS: 50.0000 mg | ORAL_TABLET | Freq: Three times a day (TID) | ORAL | Status: DC

## 2014-11-12 NOTE — Progress Notes (Signed)
   Subjective:    Patient ID: Christine [name], female    DOB: 1936-06-09, 79 y.o.   MRN: [id number]  HPI  Patient comes in today for follow-up on right shoulder and right knee pain. Right knee pain has improved quite a bit after cortisone injection. Right shoulder still bothers her. However, she does feel like the cortisone injection has helped. She states that she has some days where she has very little pain but other days where it is quite uncomfortable. She has tried hydrocodone, ibuprofen, and tramadol in the past. Tramadol works best for her. Ibuprofen upsets her stomach. Hydrocodone makes her sleepy. X-rays of the right shoulder done in April of last year showed degenerative changes both at the glenohumeral joint and the ac joint.    Review of Systems     Objective:   Physical Exam Well-developed, no acute distress. Vital signs reviewed.  Right shoulder: Patient still has limited active range of motion in all planes. Passive range of motion is a little bit better. No tenderness to palpation. Global pain and weakness with rotator cuff stressing. Not as much pain today with Hawkins and empty can testing. Neurovascular intact distally.       Assessment & Plan:  Right shoulder pain secondary to glenohumeral DJD, ac DJD, and probable attritional rotator cuff tear  Improved right knee pain secondary to DJD  Right knee pain has resolved with recent cortisone injection. Although the right shoulder is still symptomatic at times, she seems to be doing well with her tramadol. I will refill this for her today. She likely has an attritional rotator cuff tear in the setting of glenohumeral osteoarthritis. This tear is likely irreparable and therefore surgery should be a very last option. We need to continue to manage this conservatively if we can. She can return to the office at a later date for repeat injections if she needs to. Otherwise, I will see her on an as-needed basis.

## 2014-11-19 ENCOUNTER — Other Ambulatory Visit: Payer: Self-pay | Admitting: *Deleted

## 2014-11-19 DIAGNOSIS — E785 Hyperlipidemia, unspecified: Secondary | ICD-10-CM

## 2014-11-19 MED ORDER — ATORVASTATIN CALCIUM 40 MG PO TABS: 40.0000 mg | ORAL_TABLET | Freq: Every day | ORAL | Status: DC

## 2015-01-10 ENCOUNTER — Other Ambulatory Visit: Payer: Self-pay | Admitting: *Deleted

## 2015-01-10 DIAGNOSIS — L299 Pruritus, unspecified: Secondary | ICD-10-CM

## 2015-01-10 MED ORDER — LORATADINE 10 MG PO TABS: ORAL_TABLET | ORAL | Status: DC

## 2015-01-18 ENCOUNTER — Other Ambulatory Visit: Payer: Self-pay | Admitting: Internal Medicine

## 2015-01-30 ENCOUNTER — Encounter: Payer: Self-pay | Admitting: Internal Medicine

## 2015-01-30 ENCOUNTER — Ambulatory Visit (HOSPITAL_COMMUNITY)
Admission: RE | Admit: 2015-01-30 | Discharge: 2015-01-30 | Disposition: A | Discharge status: Home / Self Care | Payer: Medicare Other | Source: Ambulatory Visit | Attending: Internal Medicine | Admitting: Internal Medicine

## 2015-01-30 ENCOUNTER — Ambulatory Visit (INDEPENDENT_AMBULATORY_CARE_PROVIDER_SITE_OTHER): Payer: Medicare Other | Admitting: Internal Medicine

## 2015-01-30 VITALS — BP 123/56 | HR 80 | Temp 97.8°F | Ht 68.0 in | Wt 309.0 lb

## 2015-01-30 DIAGNOSIS — M2341 Loose body in knee, right knee: Secondary | ICD-10-CM | POA: Diagnosis not present

## 2015-01-30 DIAGNOSIS — R7309 Other abnormal glucose: Secondary | ICD-10-CM | POA: Diagnosis not present

## 2015-01-30 DIAGNOSIS — M899 Disorder of bone, unspecified: Secondary | ICD-10-CM | POA: Diagnosis not present

## 2015-01-30 DIAGNOSIS — E785 Hyperlipidemia, unspecified: Secondary | ICD-10-CM

## 2015-01-30 DIAGNOSIS — J309 Allergic rhinitis, unspecified: Secondary | ICD-10-CM | POA: Diagnosis not present

## 2015-01-30 DIAGNOSIS — M1711 Unilateral primary osteoarthritis, right knee: Secondary | ICD-10-CM | POA: Diagnosis not present

## 2015-01-30 DIAGNOSIS — I10 Essential (primary) hypertension: Secondary | ICD-10-CM

## 2015-01-30 DIAGNOSIS — M25561 Pain in right knee: Secondary | ICD-10-CM | POA: Diagnosis present

## 2015-01-30 DIAGNOSIS — R7303 Prediabetes: Secondary | ICD-10-CM

## 2015-01-30 LAB — POCT GLYCOSYLATED HEMOGLOBIN (HGB A1C): HEMOGLOBIN A1C: 6.2

## 2015-01-30 LAB — GLUCOSE, CAPILLARY: Glucose-Capillary: 112 mg/dL — ABNORMAL HIGH (ref 70–99)

## 2015-01-30 MED ORDER — ATORVASTATIN CALCIUM 40 MG PO TABS: 40.0000 mg | ORAL_TABLET | Freq: Every day | ORAL | Status: DC

## 2015-01-30 MED ORDER — LISINOPRIL 20 MG PO TABS: 20.0000 mg | ORAL_TABLET | Freq: Every day | ORAL | Status: DC

## 2015-01-30 MED ORDER — LORATADINE 10 MG PO TABS: ORAL_TABLET | ORAL | Status: DC

## 2015-01-30 NOTE — Assessment & Plan Note (Addendum)
Lab Results  Component Value Date   HGBA1C 6.2 01/30/2015   HGBA1C 6.1 02/05/2014   HGBA1C 5.8 12/08/2009     Assessment: Diabetes control:  well controlled Progress toward A1C goal:   at goal Comments: patient was not aware of diagnosis of pre diabetes. Discussed in detail with her and informed her of A1C results  Plan: Medications:  none Home glucose monitoring: Frequency:   Timing:   Instruction/counseling given: discussed the need for weight loss and discussed diet Educational resources provided:   Self management tools provided:   Other plans: Spent extensive time discussing diagnosis of pre diabetes and importance of weight loss and diet in management of it

## 2015-01-30 NOTE — Assessment & Plan Note (Signed)
-   well controlled - will refill loratadine today

## 2015-01-30 NOTE — Assessment & Plan Note (Addendum)
-   Patient complains of worsening pain in her right knee - Symptoms had transiently improved after she received an injection from sports medicine but now worsening - Knee is mildly tender to palpation with mild swelling, no erythema or increased warmth - X ray right knee with severe degenerative changes and subchondral cystic changes - Will refer back to sports medicine - has appointment next week - c/w pain control with tramadol - May need referral to ortho for TKA if no improvement - X ray results discussed with the patient

## 2015-01-30 NOTE — Progress Notes (Signed)
   Subjective:    Patient ID: Christine [name], female    DOB: Apr 27, 1936, 79 y.o.   MRN: [id number]  HPI Patient seen and examined. She is here for routine follow up of her HTN, OA and prediabetes.    Review of Systems  Constitutional: Negative.   HENT: Negative.   Eyes: Negative.   Respiratory: Negative.   Cardiovascular: Negative.   Gastrointestinal: Negative.   Musculoskeletal: Positive for arthralgias and gait problem. Negative for myalgias, back pain, neck pain and neck stiffness.  Skin: Negative.   Neurological: Negative.   Psychiatric/Behavioral: Negative.        Objective:   Physical Exam  Constitutional: She is oriented to person, place, and time. She appears well-developed and well-nourished.  HENT:  Head: Normocephalic and atraumatic.  Eyes: Conjunctivae are normal. No scleral icterus.  Neck: Normal range of motion. Neck supple.  Cardiovascular: Normal rate, regular rhythm and normal heart sounds.   Pulmonary/Chest: Effort normal and breath sounds normal. No respiratory distress. She has no wheezes. She has no rales.  Abdominal: Soft. Bowel sounds are normal. She exhibits no distension. There is no tenderness.  Musculoskeletal: Normal range of motion. She exhibits no edema.  Neurological: She is alert and oriented to person, place, and time.  Skin: Skin is warm and dry.  Psychiatric: She has a normal mood and affect. Her behavior is normal.          Assessment & Plan:  Please see problem based charting

## 2015-01-30 NOTE — Assessment & Plan Note (Signed)
BP Readings from Last 3 Encounters:  01/30/15 123/56  11/12/14 149/87  10/09/14 156/71    Lab Results  Component Value Date   NA 137 02/05/2014   K 4.0 02/05/2014   CREATININE 0.74 02/05/2014    Assessment: Blood pressure control:  well controlled Progress toward BP goal:   at goal Comments: compliant with meds  Plan: Medications:  continue current medications Educational resources provided: brochure (denies) Self management tools provided:   Other plans:

## 2015-01-30 NOTE — Assessment & Plan Note (Signed)
>>  ASSESSMENT AND PLAN FOR OSTEOARTHRITIS OF RIGHT KNEE WRITTEN ON 01/30/2015  1:08 PM BY NARENDRA, NISCHAL, MD  - Patient complains of worsening pain in her right knee - Symptoms had transiently improved after she received an injection from sports medicine but now worsening - Knee is mildly tender to palpation with mild swelling, no erythema or increased warmth - X ray right knee with severe degenerative changes and subchondral cystic changes - Will refer back to sports medicine - has appointment next week - c/w pain control with tramadol - May need referral to ortho for TKA if no improvement - X ray results discussed with the patient

## 2015-01-30 NOTE — Assessment & Plan Note (Signed)
-   Will f/u lipid panel done today - Refilled lipitor

## 2015-01-30 NOTE — Patient Instructions (Signed)
- It was a pleasure seeing you today - We will check some bloodwork and your A1C (which measures your blood sugar control) - You do have prediabetes according to your last A1C. Please try and watch your diet and weight like we discussed - I will get an X ray of your right knee to evaluate your pain. Please follow up with Dr. Micheline Chapman  Diabetes Mellitus and Food It is important for you to manage your blood sugar (glucose) level. Your blood glucose level can be greatly affected by what you eat. Eating healthier foods in the appropriate amounts throughout the day at about the same time each day will help you control your blood glucose level. It can also help slow or prevent worsening of your diabetes mellitus. Healthy eating may even help you improve the level of your blood pressure and reach or maintain a healthy weight.  HOW CAN FOOD AFFECT ME? Carbohydrates Carbohydrates affect your blood glucose level more than any other type of food. Your dietitian will help you determine how many carbohydrates to eat at each meal and teach you how to count carbohydrates. Counting carbohydrates is important to keep your blood glucose at a healthy level, especially if you are using insulin or taking certain medicines for diabetes mellitus. Alcohol Alcohol can cause sudden decreases in blood glucose (hypoglycemia), especially if you use insulin or take certain medicines for diabetes mellitus. Hypoglycemia can be a life-threatening condition. Symptoms of hypoglycemia (sleepiness, dizziness, and disorientation) are similar to symptoms of having too much alcohol.  If your health care provider has given you approval to drink alcohol, do so in moderation and use the following guidelines:  Women should not have more than one drink per day, and men should not have more than two drinks per day. One drink is equal to:  12 oz of beer.  5 oz of wine.  1 oz of hard liquor.  Do not drink on an empty stomach.  Keep yourself  hydrated. Have water, diet soda, or unsweetened iced tea.  Regular soda, juice, and other mixers might contain a lot of carbohydrates and should be counted. WHAT FOODS ARE NOT RECOMMENDED? As you make food choices, it is important to remember that all foods are not the same. Some foods have fewer nutrients per serving than other foods, even though they might have the same number of calories or carbohydrates. It is difficult to get your body what it needs when you eat foods with fewer nutrients. Examples of foods that you should avoid that are high in calories and carbohydrates but low in nutrients include:  Trans fats (most processed foods list trans fats on the Nutrition Facts label).  Regular soda.  Juice.  Candy.  Sweets, such as cake, pie, doughnuts, and cookies.  Fried foods. WHAT FOODS CAN I EAT? Have nutrient-rich foods, which will nourish your body and keep you healthy. The food you should eat also will depend on several factors, including:  The calories you need.  The medicines you take.  Your weight.  Your blood glucose level.  Your blood pressure level.  Your cholesterol level. You also should eat a variety of foods, including:  Protein, such as meat, poultry, fish, tofu, nuts, and seeds (lean animal proteins are best).  Fruits.  Vegetables.  Dairy products, such as milk, cheese, and yogurt (low fat is best).  Breads, grains, pasta, cereal, rice, and beans.  Fats such as olive oil, trans fat-free margarine, canola oil, avocado, and olives. DOES EVERYONE  WITH DIABETES MELLITUS HAVE THE SAME MEAL PLAN? Because every person with diabetes mellitus is different, there is not one meal plan that works for everyone. It is very important that you meet with a dietitian who will help you create a meal plan that is just right for you. Document Released: 06/24/2005 Document Revised: 10/02/2013 Document Reviewed: 08/24/2013 [facility] Patient Information 2015 [place],  [place]. This information is not intended to replace advice given to you by your health care provider. Make sure you discuss any questions you have with your health care provider.

## 2015-01-31 LAB — LIPID PANEL
CHOLESTEROL: 184 mg/dL (ref 0–200)
HDL: 60 mg/dL (ref >=46)
LDL Cholesterol: 107 mg/dL — ABNORMAL HIGH (ref 0–99)
TRIGLYCERIDES: 83 mg/dL (ref <150)
Total CHOL/HDL Ratio: 3.1 Ratio
VLDL: 17 mg/dL (ref 0–40)

## 2015-01-31 LAB — BASIC METABOLIC PANEL WITH GFR
BUN: 14 mg/dL (ref 6–23)
CHLORIDE: 100 meq/L (ref 96–112)
CO2: 30 meq/L (ref 19–32)
Calcium: 9.3 mg/dL (ref 8.4–10.5)
Creat: 0.65 mg/dL (ref 0.50–1.10)
GFR, Est African American: >89 mL/min
GFR, Est Non African American: 86 mL/min
Glucose, Bld: 111 mg/dL — ABNORMAL HIGH (ref 70–99)
Potassium: 4.3 mEq/L (ref 3.5–5.3)
Sodium: 140 mEq/L (ref 135–145)

## 2015-02-03 ENCOUNTER — Ambulatory Visit (INDEPENDENT_AMBULATORY_CARE_PROVIDER_SITE_OTHER): Payer: Medicare Other | Admitting: Sports Medicine

## 2015-02-03 ENCOUNTER — Encounter: Payer: Self-pay | Admitting: Sports Medicine

## 2015-02-03 VITALS — BP 141/75 | Ht 68.0 in | Wt 310.0 lb

## 2015-02-03 DIAGNOSIS — M25561 Pain in right knee: Secondary | ICD-10-CM | POA: Diagnosis not present

## 2015-02-03 DIAGNOSIS — M1711 Unilateral primary osteoarthritis, right knee: Secondary | ICD-10-CM

## 2015-02-03 MED ORDER — METHYLPREDNISOLONE ACETATE 40 MG/ML IJ SUSP
40.0000 mg | Freq: Once | INTRAMUSCULAR | Status: AC
  Administered 2015-02-03: 40 mg via INTRA_ARTICULAR

## 2015-02-03 NOTE — Progress Notes (Signed)
   Subjective:    Patient ID: Christine [name], female    DOB: Jun 01, 1936, 79 y.o.   MRN: [id number]  HPI chief complaint: Right knee pain  Patient comes in today with returning right knee pain. She has a documented history of right knee DJD. A recent set of x-rays done on April 21 demonstrates severe degenerative changes in all 3 compartments. She last received a cortisone injection back in December and this was helpful up until 2 weeks ago. No trauma. She is complaining of pain and swelling primarily in the medial aspect of her knee. Symptoms are identical in nature to what she has experienced previously with her knee DJD.    Review of Systems     Objective:   Physical Exam Obese. No acute distress  Right knee: Range of motion is 0-100. 1+ boggy synovitis. Tenderness to palpation along the medial joint line. No significant tenderness along the lateral joint line. 2+ patellofemoral crepitus. Neurovascularly intact distally. Walking with an antalgic gait.       Assessment & Plan:  Returning right knee pain secondary to tricompartment DJD  I recommended a repeat cortisone injection. Patient agrees. An anterior medial approach was utilized. Patient tolerated this without difficulty. I've explained to her that we can repeat these injections every 3-4 months if she finds them to be beneficial. We also discussed the possibility of referral for total knee arthroplasty if her symptoms warrant. Follow-up when necessary.  Consent obtained and verified. Time-out conducted. Noted no overlying erythema, induration, or other signs of local infection. Skin prepped in a sterile fashion. Topical analgesic spray: Ethyl chloride. Joint: right knee Needle: 22g 1.5 inch Completed without difficulty. Meds: 3cc 1% xylocaine, 1cc (40mg ) depomedrol  Advised to call if fevers/chills, erythema, induration, drainage, or persistent bleeding.

## 2015-03-02 ENCOUNTER — Other Ambulatory Visit: Payer: Self-pay | Admitting: Internal Medicine

## 2015-03-03 NOTE — Telephone Encounter (Signed)
Pt informed and will make an appointment

## 2015-03-03 NOTE — Telephone Encounter (Signed)
7 month supply in Dec and then one month in April. It is not clear what this is for and should not be used indefinitely. I cannot refill. Suggest appt and consideration for derm referral.

## 2015-03-18 ENCOUNTER — Emergency Department (HOSPITAL_COMMUNITY)
Admission: EM | Admit: 2015-03-18 | Discharge: 2015-03-18 | Disposition: A | Discharge status: Home / Self Care | Payer: Medicare Other | Attending: Emergency Medicine | Admitting: Emergency Medicine

## 2015-03-18 ENCOUNTER — Encounter (HOSPITAL_COMMUNITY): Payer: Self-pay | Admitting: Emergency Medicine

## 2015-03-18 DIAGNOSIS — Z87891 Personal history of nicotine dependence: Secondary | ICD-10-CM | POA: Insufficient documentation

## 2015-03-18 DIAGNOSIS — F329 Major depressive disorder, single episode, unspecified: Secondary | ICD-10-CM | POA: Insufficient documentation

## 2015-03-18 DIAGNOSIS — Z7952 Long term (current) use of systemic steroids: Secondary | ICD-10-CM | POA: Insufficient documentation

## 2015-03-18 DIAGNOSIS — E785 Hyperlipidemia, unspecified: Secondary | ICD-10-CM | POA: Insufficient documentation

## 2015-03-18 DIAGNOSIS — I10 Essential (primary) hypertension: Secondary | ICD-10-CM | POA: Diagnosis not present

## 2015-03-18 DIAGNOSIS — Z872 Personal history of diseases of the skin and subcutaneous tissue: Secondary | ICD-10-CM | POA: Diagnosis not present

## 2015-03-18 DIAGNOSIS — H269 Unspecified cataract: Secondary | ICD-10-CM | POA: Insufficient documentation

## 2015-03-18 DIAGNOSIS — R1030 Lower abdominal pain, unspecified: Secondary | ICD-10-CM | POA: Diagnosis present

## 2015-03-18 DIAGNOSIS — N3090 Cystitis, unspecified without hematuria: Secondary | ICD-10-CM | POA: Diagnosis not present

## 2015-03-18 DIAGNOSIS — Z79899 Other long term (current) drug therapy: Secondary | ICD-10-CM | POA: Insufficient documentation

## 2015-03-18 LAB — URINALYSIS, ROUTINE W REFLEX MICROSCOPIC
Bilirubin Urine: NEGATIVE
Glucose, UA: NEGATIVE mg/dL
Ketones, ur: NEGATIVE mg/dL
NITRITE: NEGATIVE
PH: 5.5 (ref 5.0–8.0)
Protein, ur: NEGATIVE mg/dL
Specific Gravity, Urine: 1.016 (ref 1.005–1.030)
Urobilinogen, UA: 0.2 mg/dL (ref 0.0–1.0)

## 2015-03-18 LAB — COMPREHENSIVE METABOLIC PANEL
ALT: 15 U/L (ref 14–54)
AST: 17 U/L (ref 15–41)
Albumin: 3.6 g/dL (ref 3.5–5.0)
Alkaline Phosphatase: 75 U/L (ref 38–126)
Anion gap: 9 (ref 5–15)
BUN: 10 mg/dL (ref 6–20)
CO2: 30 mmol/L (ref 22–32)
Calcium: 9.4 mg/dL (ref 8.9–10.3)
Chloride: 102 mmol/L (ref 101–111)
Creatinine, Ser: 0.7 mg/dL (ref 0.44–1.00)
GFR calc non Af Amer: >60 mL/min (ref >60)
Glucose, Bld: 110 mg/dL — ABNORMAL HIGH (ref 65–99)
Potassium: 4.4 mmol/L (ref 3.5–5.1)
Sodium: 141 mmol/L (ref 135–145)
Total Bilirubin: 0.5 mg/dL (ref 0.3–1.2)
Total Protein: 8 g/dL (ref 6.5–8.1)

## 2015-03-18 LAB — CBC WITH DIFFERENTIAL/PLATELET
Basophils Absolute: 0 10*3/uL (ref 0.0–0.1)
Basophils Relative: 0 % (ref 0–1)
Eosinophils Absolute: 0.1 10*3/uL (ref 0.0–0.7)
Eosinophils Relative: 1 % (ref 0–5)
HCT: 36.7 % (ref 36.0–46.0)
HEMOGLOBIN: 12.1 g/dL (ref 12.0–15.0)
LYMPHS ABS: 1.6 10*3/uL (ref 0.7–4.0)
Lymphocytes Relative: 27 % (ref 12–46)
MCH: 27.8 pg (ref 26.0–34.0)
MCHC: 33 g/dL (ref 30.0–36.0)
MCV: 84.4 fL (ref 78.0–100.0)
MONO ABS: 0.3 10*3/uL (ref 0.1–1.0)
Monocytes Relative: 5 % (ref 3–12)
NEUTROS PCT: 67 % (ref 43–77)
Neutro Abs: 3.9 10*3/uL (ref 1.7–7.7)
Platelets: 167 10*3/uL (ref 150–400)
RBC: 4.35 MIL/uL (ref 3.87–5.11)
RDW: 14.6 % (ref 11.5–15.5)
WBC: 5.9 10*3/uL (ref 4.0–10.5)

## 2015-03-18 LAB — URINE MICROSCOPIC-ADD ON

## 2015-03-18 MED ORDER — PHENAZOPYRIDINE HCL 200 MG PO TABS: 200.0000 mg | ORAL_TABLET | Freq: Three times a day (TID) | ORAL | Status: DC

## 2015-03-18 MED ORDER — PHENAZOPYRIDINE HCL 100 MG PO TABS
200.0000 mg | ORAL_TABLET | Freq: Once | ORAL | Status: AC
  Administered 2015-03-18: 200 mg via ORAL
  Filled 2015-03-18: qty 2

## 2015-03-18 MED ORDER — MORPHINE SULFATE 4 MG/ML IJ SOLN
4.0000 mg | Freq: Once | INTRAMUSCULAR | Status: AC
  Administered 2015-03-18: 4 mg via INTRAVENOUS
  Filled 2015-03-18: qty 1

## 2015-03-18 MED ORDER — CEFTRIAXONE SODIUM 1 G IJ SOLR
1.0000 g | Freq: Once | INTRAMUSCULAR | Status: AC
  Administered 2015-03-18: 1 g via INTRAVENOUS
  Filled 2015-03-18: qty 10

## 2015-03-18 MED ORDER — ONDANSETRON HCL 4 MG/2ML IJ SOLN
4.0000 mg | Freq: Once | INTRAMUSCULAR | Status: AC
  Administered 2015-03-18: 4 mg via INTRAVENOUS
  Filled 2015-03-18: qty 2

## 2015-03-18 MED ORDER — CEPHALEXIN 500 MG PO CAPS: 500.0000 mg | ORAL_CAPSULE | Freq: Two times a day (BID) | ORAL | Status: DC

## 2015-03-18 NOTE — ED Notes (Signed)
Pt reports lower abdominal pain, left flank pain, and painful urination that started yesterday. Pt denies fever.

## 2015-03-18 NOTE — ED Notes (Signed)
PA at BS.  

## 2015-03-18 NOTE — Discharge Instructions (Signed)
Please read and follow all provided instructions.  Your diagnoses today include:  1. Cystitis     Tests performed today include:  Urine test - suggests that you have an infection in your bladder  Blood counts and electrolytes - are normal  Vital signs. See below for your results today.   Medications prescribed:   Keflex (cephalexin) - antibiotic  You have been prescribed an antibiotic medicine: take the entire course of medicine even if you are feeling better. Stopping early can cause the antibiotic not to work.   Pyridium - medication for urinary tract infection symptoms.   This medication will turn your urine orange. This is normal.    Home care instructions:  Follow any educational materials contained in this packet.  Follow-up instructions: Please follow-up with your primary care provider in 3 days for further evaluation of your symptoms.  Return instructions:   Please return to the Emergency Department if you experience worsening symptoms.   Return with fever, worsening pain, persistent vomiting, worsening pain in your back.   Please return if you have any other emergent concerns.  Additional Information:  Your vital signs today were: BP 129/66 mmHg   Pulse 78   Temp(Src) 97.5 F (36.4 C) (Oral)   Resp 16   Ht 5' 8.25" (1.734 m)   Wt 309 lb (140.161 kg)   BMI 46.62 kg/m2   SpO2 97%   LMP 01/12/1972 If your blood pressure (BP) was elevated above 135/85 this visit, please have this repeated by your doctor within one month. --------------

## 2015-03-18 NOTE — ED Provider Notes (Signed)
CSN: [id number]     Arrival date & time 03/18/15  0615 History   First MD Initiated Contact with Patient 03/18/15 [phone]     Chief Complaint  Patient presents with  . Abdominal Pain  . Flank Pain     (Consider location/radiation/quality/duration/timing/severity/associated sxs/prior Treatment) HPI Comments: Patient with 1 day history of suprapubic pain, dysuria, frequency, urgency and mild left back pain. Patient denies fever or vomiting. She denies chest pain, shortness of breath. No diarrhea, vaginal bleeding or discharge. Patient has taken Tylenol without relief. No history of abdominal surgeries. The onset of this condition was acute. The course is constant. Aggravating factors: palpation. Alleviating factors: none.    Patient is a 79 y.o. female presenting with abdominal pain and flank pain. The history is provided by the patient and medical records.  Abdominal Pain Associated symptoms: dysuria   Associated symptoms: no chest pain, no cough, no diarrhea, no fever, no nausea, no sore throat, no vaginal bleeding, no vaginal discharge and no vomiting   Flank Pain Associated symptoms include abdominal pain. Pertinent negatives include no chest pain, coughing, fever, headaches, myalgias, nausea, rash, sore throat or vomiting.    Past Medical History  Diagnosis Date  . Hyperlipemia   . Hypertension   . Seasonal allergies   . Cataract   . Thyroid nodule   . Depression   . Pruritus   . Morbidly obese   . Impaired glucose tolerance    History reviewed. No pertinent past surgical history. Family History  Problem Relation Age of Onset  . Colon cancer Mother     deceased age 23  . Cancer Mother   . Multiple myeloma Father   . Cancer Father    History  Substance Use Topics  . Smoking status: Former Smoker    Quit date: 12/14/1984  . Smokeless tobacco: Not on file  . Alcohol Use: No   OB History    No data available     Review of Systems  Constitutional: Negative for fever.   HENT: Negative for rhinorrhea and sore throat.   Eyes: Negative for redness.  Respiratory: Negative for cough.   Cardiovascular: Negative for chest pain.  Gastrointestinal: Positive for abdominal pain. Negative for nausea, vomiting and diarrhea.  Genitourinary: Positive for dysuria, urgency, frequency and flank pain. Negative for vaginal bleeding and vaginal discharge.  Musculoskeletal: Negative for myalgias.  Skin: Negative for rash.  Neurological: Negative for headaches.      Allergies  Influenza vaccines and Fexofenadine-pseudoephed er  Home Medications   Prior to Admission medications   Medication Sig Start Date End Date Taking? Authorizing Provider  atorvastatin (LIPITOR) 40 MG tablet Take 1 tablet (40 mg total) by mouth daily. 01/30/15   Nischal Narendra, MD  fluocinolone (SYNALAR) 0.01 % external solution Apply topically 2 (two) times daily. 09/23/14   Alexa Sherral Hammers, MD  FLUOCINOLONE ACETONIDE SCALP 0.01 % OIL  08/20/14   Historical Provider, MD  fluocinonide ointment (LIDEX) 0.05 % apply to affected area twice a day 01/20/15   Aldine Contes, MD  ibuprofen (ADVIL,MOTRIN) 600 MG tablet take 1 tablet by mouth every 6 hours if needed 10/29/14   Aldine Contes, MD  ketoconazole (NIZORAL) 2 % shampoo Apply topically 2 (two) times a week. 09/23/14   Alexa Sherral Hammers, MD  lactase (LACTAID) 3000 UNIT tablet Take 1 tablet before milk products     Historical Provider, MD  lisinopril (PRINIVIL,ZESTRIL) 20 MG tablet Take 1 tablet (20 mg total) by mouth daily. 01/30/15  Aldine Contes, MD  loratadine (CLARITIN) 10 MG tablet take 1 tablet by mouth twice a day if needed for itching 01/30/15   Aldine Contes, MD  sennosides-docusate sodium (SENOKOT-S) 8.6-50 MG tablet Take 2 tablets by mouth daily. STOP IF YOU ARE HAVING LOOSE STOOLS. 09/22/12   Maitri S Kalia-Reynolds, DO  traMADol (ULTRAM) 50 MG tablet Take 1 tablet (50 mg total) by mouth every 8 (eight) hours. 11/12/14   Carlos Levering  Draper, DO   BP 147/75 mmHg  Pulse 96  Temp(Src) 97.5 F (36.4 C) (Oral)  Resp 20  Ht 5' 8.25" (1.734 m)  Wt 309 lb (140.161 kg)  BMI 46.62 kg/m2  SpO2 100%  LMP 01/12/1972   Physical Exam  Constitutional: She appears well-developed and well-nourished.  HENT:  Head: Normocephalic and atraumatic.  Eyes: Conjunctivae are normal. Right eye exhibits no discharge. Left eye exhibits no discharge.  Neck: Normal range of motion. Neck supple.  Cardiovascular: Normal rate, regular rhythm and normal heart sounds.   Pulmonary/Chest: Effort normal and breath sounds normal.  Abdominal: Soft. Bowel sounds are normal. There is tenderness in the suprapubic area. There is no rigidity, no rebound, no guarding and no CVA tenderness.  Neurological: She is alert.  Skin: Skin is warm and dry.  Psychiatric: She has a normal mood and affect.  Nursing note and vitals reviewed.   ED Course  Procedures (including critical care time) Labs Review Labs Reviewed  COMPREHENSIVE METABOLIC PANEL - Abnormal; Notable for the following:    Glucose, Bld 110 (*)    All other components within normal limits  URINALYSIS, ROUTINE W REFLEX MICROSCOPIC (NOT AT [facility]) - Abnormal; Notable for the following:    APPearance CLOUDY (*)    Hgb urine dipstick LARGE (*)    Leukocytes, UA MODERATE (*)    All other components within normal limits  URINE CULTURE  CBC WITH DIFFERENTIAL/PLATELET  URINE MICROSCOPIC-ADD ON    Imaging Review No results found.   EKG Interpretation None       6:40 AM Patient seen and examined. Work-up initiated. Medications ordered.   Vital signs reviewed and are as follows: BP 147/75 mmHg  Pulse 96  Temp(Src) 97.5 F (36.4 C) (Oral)  Resp 20  Ht 5' 8.25" (1.734 m)  Wt 309 lb (140.161 kg)  BMI 46.62 kg/m2  SpO2 100%  LMP 01/12/1972  10:08 AM Patient discussed with and seen by Dr. Ralene Bathe.   Patient treated in ED with IV Rocephin and pyridium. She is feeling somewhat better. Will  d/c to home. Encourage patient to follow-up with PCP in a next 3 days for recheck. Patient encouraged to return to the emergency department with fever, vomiting, worsening pain or other concerns. Patient verbalizes understanding and agrees with plan.    MDM   Final diagnoses:  Cystitis   Patient received suprapubic pain. She has had some left flank pain which is mild. Clinically her history and physical do not suggest pyelonephritis. White blood cell count is normal. She has no left lower quadrant pain to suggest diverticulitis. No vaginal bleeding or discharge. Dysuria and suprapubic tenderness most significant findings. Will treat for cystitis. Culture sent. PCP follow-up recommended. Patient appears well, nontoxic and stable for discharge.  No dangerous or life-threatening conditions suspected or identified by history, physical exam, and by work-up. No indications for hospitalization identified.      Carlisle Cater, PA-C 03/18/15 1010  Quintella Reichert, MD 03/18/15 [phone]

## 2015-03-20 ENCOUNTER — Encounter: Payer: Self-pay | Admitting: Pulmonary Disease

## 2015-03-20 ENCOUNTER — Ambulatory Visit (INDEPENDENT_AMBULATORY_CARE_PROVIDER_SITE_OTHER): Payer: Medicare Other | Admitting: Pulmonary Disease

## 2015-03-20 VITALS — BP 138/67 | HR 82 | Temp 97.6°F | Ht 68.0 in | Wt 308.8 lb

## 2015-03-20 DIAGNOSIS — R32 Unspecified urinary incontinence: Secondary | ICD-10-CM | POA: Diagnosis not present

## 2015-03-20 DIAGNOSIS — B9620 Unspecified Escherichia coli [E. coli] as the cause of diseases classified elsewhere: Secondary | ICD-10-CM | POA: Diagnosis not present

## 2015-03-20 DIAGNOSIS — N390 Urinary tract infection, site not specified: Secondary | ICD-10-CM | POA: Diagnosis not present

## 2015-03-20 DIAGNOSIS — I10 Essential (primary) hypertension: Secondary | ICD-10-CM

## 2015-03-20 LAB — URINE CULTURE
Colony Count: >=100000
SPECIAL REQUESTS: NORMAL

## 2015-03-20 NOTE — Progress Notes (Signed)
Medicine attending: Medical history, presenting problems, physical findings, and medications, reviewed with Dr Jennifer Krall and I concur with her evaluation and management plan. 

## 2015-03-20 NOTE — Assessment & Plan Note (Signed)
Order placed for pads/diapers

## 2015-03-20 NOTE — Progress Notes (Signed)
Subjective:   Patient ID: Christine [name], female    DOB: 09-Sep-1936, 79 y.o.   MRN: [id number]  HPI Christine [name] is a 79 year old woman with history of hyperlipidemia, hypertension, GERD, prediabetes presenting for follow-up.  She was seen in the ED on 03/18/2015 for abdominal and flank pain. She was treated with IV Rocephin and Pyridium. Urine culture with greater than 100,000 colonies per milliliter Escherichia coli. Sensitivity pending. She was discharged with Keflex 500 mg twice a day for 7 days.  Since then she reports improvement of her of symptoms. Her pain previously was 10/10 and is now 6/10. She has nausea with her antibiotic and reports she has been taking two tablets daily. Denies fevers, vomiting, diarrhea.  Review of Systems Constitutional: no fevers/chills Eyes: no vision changes Ears, nose, mouth, throat, and face: no cough Respiratory: no shortness of breath Cardiovascular: no chest pain Gastrointestinal: +nausea, no vomiting, no abdominal pain, no constipation, no diarrhea Genitourinary: +dysuria, no hematuria Integument: no rash Hematologic/lymphatic: no bleeding/bruising, no edema Musculoskeletal: no arthralgias, no myalgias Neurological: no paresthesias, no weakness  Past Medical History  Diagnosis Date  . Hyperlipemia   . Hypertension   . Seasonal allergies   . Cataract   . Thyroid nodule   . Depression   . Pruritus   . Morbidly obese   . Impaired glucose tolerance     Current Outpatient Prescriptions on File Prior to Visit  Medication Sig Dispense Refill  . atorvastatin (LIPITOR) 40 MG tablet Take 1 tablet (40 mg total) by mouth daily. 90 tablet 0  . cephALEXin (KEFLEX) 500 MG capsule Take 1 capsule (500 mg total) by mouth 2 (two) times daily. 14 capsule 0  . fluocinolone (SYNALAR) 0.01 % external solution Apply topically 2 (two) times daily. 60 mL 6  . ibuprofen (ADVIL,MOTRIN) 600 MG tablet take 1 tablet by mouth every 6 hours if needed 30  tablet 0  . ketoconazole (NIZORAL) 2 % shampoo Apply topically 2 (two) times a week. 120 mL 6  . lactase (LACTAID) 3000 UNIT tablet Take 1 tablet before milk products     . lisinopril (PRINIVIL,ZESTRIL) 20 MG tablet Take 1 tablet (20 mg total) by mouth daily. 90 tablet 1  . loratadine (CLARITIN) 10 MG tablet take 1 tablet by mouth twice a day if needed for itching 180 tablet 1  . phenazopyridine (PYRIDIUM) 200 MG tablet Take 1 tablet (200 mg total) by mouth 3 (three) times daily. 6 tablet 0  . traMADol (ULTRAM) 50 MG tablet Take 1 tablet (50 mg total) by mouth every 8 (eight) hours. 60 tablet 2   No current facility-administered medications on file prior to visit.    Today's Vitals   03/20/15 0925  BP: 138/67  Pulse: 82  Temp: 97.6 F (36.4 C)  TempSrc: Oral  Height: 5\' 8"  (1.727 m)  Weight: 308 lb 12.8 oz (140.071 kg)  SpO2: 100%    Objective:  Physical Exam  Constitutional: She is oriented to person, place, and time. She appears well-developed and well-nourished.  HENT:  Head: Normocephalic and atraumatic.  Eyes: Conjunctivae are normal.  Neck: Neck supple.  Cardiovascular: Normal rate.   Pulmonary/Chest: Effort normal.  Abdominal: Soft. She exhibits no distension. There is no tenderness.  Musculoskeletal: Normal range of motion.  Neurological: She is alert and oriented to person, place, and time.  Skin: Skin is warm and dry.  Psychiatric: She has a normal mood and affect.     Assessment & Plan:  Please refer to problem based charting.

## 2015-03-20 NOTE — Assessment & Plan Note (Signed)
BP Readings from Last 3 Encounters:  03/20/15 138/67  03/18/15 125/54  02/03/15 141/75    Lab Results  Component Value Date   NA 141 03/18/2015   K 4.4 03/18/2015   CREATININE 0.70 03/18/2015    Assessment: Blood pressure control: controlled Progress toward BP goal:  at goal  Plan: Medications:  continue current medications including lisinopril 20 mg daily

## 2015-03-20 NOTE — Assessment & Plan Note (Signed)
She was seen in the ED on 03/18/2015 UTI. She was treated with IV Rocephin and Pyridium. Urine culture with >100,000 colonies/ml E. coli. Sensitivity pending. On Keflex 500 mg twice a day for 7 days. Symptoms have improved.  Plan: -Continue Keflex 500mg  BID to finish 7 day antibiotic course

## 2015-03-20 NOTE — Patient Instructions (Addendum)
Please complete your antibiotics that were prescribed in the Emergency Department.  If you have worsening of your symptoms or develop fevers, vomiting, diarrhea, please call the clinic.  General Instructions:   Thank you for bringing your medicines today. This helps [place] keep you safe from mistakes.   Progress Toward Treatment Goals:  Treatment Goal 03/20/2015  Blood pressure at goal    Self Care Goals & Plans:  Self Care Goal 03/20/2015  Manage my medications bring my medications to every visit  Monitor my health -  Eat healthy foods drink diet soda or water instead of juice or soda; eat more vegetables; eat foods that are low in salt; eat baked foods instead of fried foods; eat fruit for snacks and desserts; eat smaller portions  Be physically active find an activity I enjoy  Meeting treatment goals -

## 2015-03-21 ENCOUNTER — Telehealth: Payer: Self-pay | Admitting: *Deleted

## 2015-04-09 ENCOUNTER — Telehealth: Payer: Self-pay | Admitting: Licensed Clinical Social Worker

## 2015-04-09 NOTE — Telephone Encounter (Signed)
CSW placed call to Ms. Kellogg to notify pt SCAT application left at [facility] has been completed and faxed to [place] office.  In addition, during pt's most recent [facility] office visit, pt indicated she needed assistance with ADL's.  CSW will inquire if pt is in need of PCS.  CSW placed called to pt.  CSW left message requesting return call. CSW provided contact hours and phone number.

## 2015-04-10 ENCOUNTER — Encounter: Payer: Self-pay | Admitting: Licensed Clinical Social Worker

## 2015-04-10 NOTE — Telephone Encounter (Signed)
Pt notified SCAT A & B has been faxed to the [place] office on 04/09/15.  Portion PART A has been returned to Ms. Aybar via mail.  CSW encouraged Ms. Frakes to contact the  SCAT office to schedule her interview.  Pt did confirm she receives PCS from [facility] agency.

## 2015-05-01 ENCOUNTER — Other Ambulatory Visit: Payer: Self-pay | Admitting: Internal Medicine

## 2015-07-13 ENCOUNTER — Other Ambulatory Visit: Payer: Self-pay | Admitting: Internal Medicine

## 2015-09-09 ENCOUNTER — Encounter (HOSPITAL_COMMUNITY): Payer: Self-pay | Admitting: Emergency Medicine

## 2015-09-09 ENCOUNTER — Emergency Department (HOSPITAL_COMMUNITY)
Admission: EM | Admit: 2015-09-09 | Discharge: 2015-09-10 | Disposition: A | Discharge status: Home / Self Care | Payer: Medicare Other | Attending: Emergency Medicine | Admitting: Emergency Medicine

## 2015-09-09 DIAGNOSIS — R300 Dysuria: Secondary | ICD-10-CM | POA: Diagnosis not present

## 2015-09-09 DIAGNOSIS — H269 Unspecified cataract: Secondary | ICD-10-CM | POA: Diagnosis not present

## 2015-09-09 DIAGNOSIS — E785 Hyperlipidemia, unspecified: Secondary | ICD-10-CM | POA: Insufficient documentation

## 2015-09-09 DIAGNOSIS — I10 Essential (primary) hypertension: Secondary | ICD-10-CM | POA: Diagnosis not present

## 2015-09-09 DIAGNOSIS — Z791 Long term (current) use of non-steroidal anti-inflammatories (NSAID): Secondary | ICD-10-CM | POA: Insufficient documentation

## 2015-09-09 DIAGNOSIS — Z79899 Other long term (current) drug therapy: Secondary | ICD-10-CM | POA: Insufficient documentation

## 2015-09-09 DIAGNOSIS — B029 Zoster without complications: Secondary | ICD-10-CM | POA: Diagnosis not present

## 2015-09-09 DIAGNOSIS — F329 Major depressive disorder, single episode, unspecified: Secondary | ICD-10-CM | POA: Diagnosis not present

## 2015-09-09 DIAGNOSIS — Z87891 Personal history of nicotine dependence: Secondary | ICD-10-CM | POA: Diagnosis not present

## 2015-09-09 DIAGNOSIS — R21 Rash and other nonspecific skin eruption: Secondary | ICD-10-CM | POA: Diagnosis present

## 2015-09-09 DIAGNOSIS — Z872 Personal history of diseases of the skin and subcutaneous tissue: Secondary | ICD-10-CM | POA: Insufficient documentation

## 2015-09-09 LAB — URINALYSIS, ROUTINE W REFLEX MICROSCOPIC
Bilirubin Urine: NEGATIVE
Glucose, UA: NEGATIVE mg/dL
Hgb urine dipstick: NEGATIVE
Ketones, ur: NEGATIVE mg/dL
LEUKOCYTES UA: NEGATIVE
Nitrite: NEGATIVE
Protein, ur: NEGATIVE mg/dL
SPECIFIC GRAVITY, URINE: 1.014 (ref 1.005–1.030)
pH: 5 (ref 5.0–8.0)

## 2015-09-09 MED ORDER — VALACYCLOVIR HCL 1 G PO TABS: 1000.0000 mg | ORAL_TABLET | Freq: Three times a day (TID) | ORAL | Status: DC

## 2015-09-09 MED ORDER — VALACYCLOVIR HCL 500 MG PO TABS
1000.0000 mg | ORAL_TABLET | Freq: Once | ORAL | Status: AC
  Administered 2015-09-10: 1000 mg via ORAL
  Filled 2015-09-09: qty 2

## 2015-09-09 NOTE — Discharge Instructions (Signed)

## 2015-09-09 NOTE — ED Provider Notes (Signed)
CSN: [id number]     Arrival date & time 09/09/15  1626 History   First MD Initiated Contact with Patient 09/09/15 2300     Chief Complaint  Patient presents with  . Dysuria  . Rash     (Consider location/radiation/quality/duration/timing/severity/associated sxs/prior Treatment) HPI   Christine [name] is a 79 y.o. female presents for rash which started last night. Rash is uncomfortable pruritic and painful. She denies fever, chills, nausea, vomiting, weakness or dizziness. No prior similar rash.There are no other no modifying factors.   Past Medical History  Diagnosis Date  . Hyperlipemia   . Hypertension   . Seasonal allergies   . Cataract   . Thyroid nodule   . Depression   . Pruritus   . Morbidly obese ([place])   . Impaired glucose tolerance    History reviewed. No pertinent past surgical history. Family History  Problem Relation Age of Onset  . Colon cancer Mother     deceased age 53  . Cancer Mother   . Multiple myeloma Father   . Cancer Father    Social History  Substance Use Topics  . Smoking status: Former Smoker    Quit date: 12/14/1984  . Smokeless tobacco: None  . Alcohol Use: No   OB History    No data available     Review of Systems  All other systems reviewed and are negative.     Allergies  Influenza vaccines and Fexofenadine-pseudoephed er  Home Medications   Prior to Admission medications   Medication Sig Start Date End Date Taking? Authorizing Provider  atorvastatin (LIPITOR) 40 MG tablet take 1 tablet by mouth once daily 07/14/15   Aldine Contes, MD  cephALEXin (KEFLEX) 500 MG capsule Take 1 capsule (500 mg total) by mouth 2 (two) times daily. 03/18/15   Carlisle Cater, PA-C  fluocinolone (SYNALAR) 0.01 % external solution Apply topically 2 (two) times daily. 09/23/14   Alexa Sherral Hammers, MD  ibuprofen (ADVIL,MOTRIN) 600 MG tablet take 1 tablet by mouth every 6 hours if needed 10/29/14   Aldine Contes, MD  ketoconazole (NIZORAL) 2 %  shampoo Apply topically 2 (two) times a week. 09/23/14   Alexa Sherral Hammers, MD  lactase (LACTAID) 3000 UNIT tablet Take 1 tablet before milk products     Historical Provider, MD  lisinopril (PRINIVIL,ZESTRIL) 20 MG tablet take 1 tablet by mouth once daily 07/14/15   Aldine Contes, MD  loratadine (CLARITIN) 10 MG tablet take 1 tablet by mouth twice a day if needed for itching 01/30/15   Aldine Contes, MD  phenazopyridine (PYRIDIUM) 200 MG tablet Take 1 tablet (200 mg total) by mouth 3 (three) times daily. 03/18/15   Carlisle Cater, PA-C  traMADol (ULTRAM) 50 MG tablet Take 1 tablet (50 mg total) by mouth every 8 (eight) hours. 11/12/14   Carlos Levering Draper, DO   BP 161/76 mmHg  Pulse 90  Temp(Src) 98.2 F (36.8 C) (Oral)  Resp 18  Wt 306 lb 3 oz (138.886 kg)  SpO2 98%  LMP 01/12/1972 Physical Exam  Constitutional: She is oriented to person, place, and time. She appears well-developed and well-nourished.  HENT:  Head: Normocephalic and atraumatic.  Right Ear: External ear normal.  Left Ear: External ear normal.  Eyes: Conjunctivae and EOM are normal. Pupils are equal, round, and reactive to light.  Neck: Normal range of motion and phonation normal. Neck supple.  Cardiovascular: Normal rate and regular rhythm.   Pulmonary/Chest: Effort normal. She exhibits no bony tenderness.  Musculoskeletal: Normal range of motion.  Neurological: She is alert and oriented to person, place, and time. No cranial nerve deficit or sensory deficit. She exhibits normal muscle tone. Coordination normal.  Skin: Skin is warm, dry and intact.  Vesicular rash right lumbar 5 dermatome  Psychiatric: She has a normal mood and affect. Her behavior is normal. Judgment and thought content normal.  Nursing note and vitals reviewed.   ED Course  Procedures (including critical care time) Labs Review Labs Reviewed  URINALYSIS, ROUTINE W REFLEX MICROSCOPIC (NOT AT [facility])    Imaging Review No results found. I have  personally reviewed and evaluated these images and lab results as part of my medical decision-making.   EKG Interpretation None      MDM   Final diagnoses:  Shingles    Uncomplicated herpes zoster, right lumbar. Evidence for secondary infection or systemic infection.  Nursing Notes Reviewed/ Care Coordinated Applicable Imaging Reviewed Interpretation of Laboratory Data incorporated into ED treatment  The patient appears reasonably screened and/or stabilized for discharge and I doubt any other medical condition or other [facility] requiring further screening, evaluation, or treatment in the ED at this time prior to discharge.  Plan: Home Medications- Valtrex; Home Treatments- wound care; return here if the recommended treatment, does not improve the symptoms; Recommended follow up- PCP, when necessary     Daleen Bo, MD 09/10/15 [phone]

## 2015-09-09 NOTE — ED Notes (Signed)
Pt c/o rash to back and lower stomach; pt sts some dysuria and cough also

## 2015-09-10 DIAGNOSIS — B029 Zoster without complications: Secondary | ICD-10-CM | POA: Diagnosis not present

## 2015-09-12 ENCOUNTER — Encounter: Payer: Self-pay | Admitting: Internal Medicine

## 2015-09-12 ENCOUNTER — Ambulatory Visit (INDEPENDENT_AMBULATORY_CARE_PROVIDER_SITE_OTHER): Payer: Medicare Other | Admitting: Internal Medicine

## 2015-09-12 VITALS — BP 165/82 | HR 105 | Temp 97.5°F | Ht 68.25 in | Wt 302.8 lb

## 2015-09-12 DIAGNOSIS — B029 Zoster without complications: Secondary | ICD-10-CM

## 2015-09-12 MED ORDER — TRAMADOL-ACETAMINOPHEN 37.5-325 MG PO TABS: 1.0000 | ORAL_TABLET | Freq: Four times a day (QID) | ORAL | Status: DC | PRN

## 2015-09-12 NOTE — Progress Notes (Signed)
   Subjective:    Patient ID: Christine [name], female    DOB: 12-03-1935, 79 y.o.   MRN: [id number]  HPI Christine [name] is a 79 year old female with hypertension, GERD, depression, prediabetes, morbid obesity, hyperlipidemia who presents today for follow-up for shingles. Please see assessment & plan for documentation of chronic medical problems.   Review of Systems  Constitutional: Positive for chills. Negative for fever.  Gastrointestinal: Positive for nausea and vomiting.  Genitourinary: Positive for flank pain.  Skin: Positive for rash.  Psychiatric/Behavioral: Positive for sleep disturbance.       Objective:   Physical Exam  Constitutional: She is oriented to person, place, and time. She appears well-developed and well-nourished. She appears distressed.  HENT:  Head: Normocephalic and atraumatic.  Eyes: Conjunctivae are normal. Pupils are equal, round, and reactive to light.  Pulmonary/Chest: No respiratory distress.  Musculoskeletal: She exhibits tenderness.  Neurological: She is alert and oriented to person, place, and time. No cranial nerve deficit.  Skin: Rash (Patches of vesicles overlying an erythematous base spanning from right lumbar area down to her thigh and eventually underneath pannus overlying right groin area. Tender to palpation.) noted. She is not diaphoretic.          Assessment & Plan:

## 2015-09-12 NOTE — Patient Instructions (Addendum)
Please bring your medicines with you each time you come to clinic.  Medicines may include prescription medications, over-the-counter medications, herbal remedies, eye drops, vitamins, or other pills.  #1 please take Ultracet 1 tablet every 6 hours as needed for pain.  #2 please see me back next week if you feel no better  #3 please remember that the rash is CONTAGIOUS so remember to wash your hands after touching it and to avoid being around pregnant women or newborns   Progress Toward Treatment Goals:  Treatment Goal 03/20/2015  Blood pressure at goal    Self Care Goals & Plans:  Self Care Goal 09/12/2015  Manage my medications take my medicines as prescribed; bring my medications to every visit; refill my medications on time; follow the sick day instructions if I am sick  Monitor my health -  Eat healthy foods eat more vegetables; eat fruit for snacks and desserts; eat foods that are low in salt; eat smaller portions; eat baked foods instead of fried foods; drink diet soda or water instead of juice or soda  Be physically active find an activity I enjoy  Meeting treatment goals -    No flowsheet data found.   Care Management & Community Referrals:  Referral 03/20/2015  Referrals made for care management support none needed  Referrals made to community resources -

## 2015-09-12 NOTE — Assessment & Plan Note (Signed)
Overview She reports the onset of rash on 11/28 while she was showering at which time it was just "one bump alongside my right back." Pain increased the following day as her rash evolved, so she was seen in the emergency department. Per ED providers note, skin lesions were described overlying the right lumbar distribution suggestive of shingles. She was discharged on Valtrex and recommended follow-up with PCP should her symptoms not improve.  She reports taking her Valtrex 1 g 3 times daily as instructed by the ED provider though felt it has not improved her pain. She describes the pain as 10/10 with a stabbing, sharp, burning quality that is "worse than giving birth "she has had 9 children. She denies any changes to the quality of her lesions and describes it mainly as blister/bumps without any drainage but they have extended from the right lumbar region all the way across her thigh into the groin area. She denies ever receiving the Zostavax as she has an allergy to flu vaccines and cannot recall if she ever had a history of chickenpox as a child.  Assessment Poorly controlled pain secondary to shingles rash given that the skin lesions are overlying dermatomal distributions.  Plan -Advised her to continue taking Valtrex and explained to her that this medication was meant to address the virus as opposed to her pain -Prescribed Ultracet 1 tablet every 6 hours as needed given that she had a good response to tramadol the past when she was having shoulder issues -Explained to her that her she may have postherpetic neuralgia so not to expect that the pain fully resolve once the rash resolved to which she expressed understanding -Advised her to follow-up in 1 week if her symptoms are no better

## 2015-09-16 ENCOUNTER — Telehealth: Payer: Self-pay

## 2015-09-16 NOTE — Telephone Encounter (Signed)
I'm wondering if she had another bout of shingles.  Can we ask her to see if this new rash appeared similar to the prior one? This would confirm my suspicion.  How often has she been taking her pain medication?

## 2015-09-16 NOTE — Progress Notes (Signed)
Internal Medicine Clinic Attending  Case discussed with Dr. Patel,Rushil at the time of the visit.  We reviewed the resident's history and exam and pertinent patient test results.  I agree with the assessment, diagnosis, and plan of care documented in the resident's note.  

## 2015-09-16 NOTE — Telephone Encounter (Signed)
I agree with Dr. Serita Grit recommendations. Thank you

## 2015-09-16 NOTE — Telephone Encounter (Signed)
I attempted to reach the patient just now without any luck. We will reassess her rash tomorrow to decide how to best treat it.  With regards to her pain, she may take 2 tablets every 6 hours as needed.  Please relay this message to her should she call back to the clinic.

## 2015-09-16 NOTE — Telephone Encounter (Signed)
Took call from patient.  She is reporting increased pain and a new rash that started yesterday.  She reports she thought everything was clearing up and noticed small rash on R hip, stomach, and thigh and today it has worsened from yesterday.  Pt also reporting nausea and diarrhea that started today.  She is taking her pain medication as prescribed and it is not providing much relief.  She is taking it with something on her stomach.  She has not vomited.  Diarrhea x2 this morning.  Pt follow-up apt moved from 12/9 to 12/7.  Pt wanting to know what she can do today to help her symptoms. Please advise.

## 2015-09-16 NOTE — Telephone Encounter (Signed)
Says it looks the same.  And when I asked if she was taking q6 hours she said yes.

## 2015-09-17 ENCOUNTER — Ambulatory Visit (INDEPENDENT_AMBULATORY_CARE_PROVIDER_SITE_OTHER): Payer: Medicare Other | Admitting: Internal Medicine

## 2015-09-17 ENCOUNTER — Encounter: Payer: Self-pay | Admitting: Internal Medicine

## 2015-09-17 VITALS — BP 163/77 | HR 87 | Temp 97.7°F | Ht 68.0 in | Wt 303.2 lb

## 2015-09-17 DIAGNOSIS — Z79899 Other long term (current) drug therapy: Secondary | ICD-10-CM | POA: Diagnosis not present

## 2015-09-17 DIAGNOSIS — B029 Zoster without complications: Secondary | ICD-10-CM

## 2015-09-17 DIAGNOSIS — I10 Essential (primary) hypertension: Secondary | ICD-10-CM | POA: Diagnosis not present

## 2015-09-17 DIAGNOSIS — J309 Allergic rhinitis, unspecified: Secondary | ICD-10-CM

## 2015-09-17 MED ORDER — TRAMADOL-ACETAMINOPHEN 37.5-325 MG PO TABS: 1.0000 | ORAL_TABLET | Freq: Four times a day (QID) | ORAL | Status: DC | PRN

## 2015-09-17 MED ORDER — LORATADINE 10 MG PO TABS: ORAL_TABLET | ORAL | Status: DC

## 2015-09-17 MED ORDER — ATORVASTATIN CALCIUM 40 MG PO TABS: 40.0000 mg | ORAL_TABLET | Freq: Every day | ORAL | Status: DC

## 2015-09-17 MED ORDER — LIDOCAINE 5 % EX PTCH: 1.0000 | MEDICATED_PATCH | CUTANEOUS | Status: DC

## 2015-09-17 MED ORDER — LISINOPRIL 20 MG PO TABS: 20.0000 mg | ORAL_TABLET | Freq: Every day | ORAL | Status: DC

## 2015-09-17 NOTE — Progress Notes (Signed)
   Subjective:    Patient ID: Christine [name], female    DOB: 1936/01/25, 79 y.o.   MRN: [id number]  HPI Christine [name] is a 79 year old female with hypertension, GERD, morbid obesity, shingles rash who presents today for new rash. Please see assessment & plan for status of chronic medical problems.    Review of Systems  Constitutional: Positive for fever and chills.  Gastrointestinal: Positive for nausea.  Skin: Positive for rash.  Psychiatric/Behavioral: Negative for sleep disturbance.       Objective:   Physical Exam  Constitutional: She is oriented to person, place, and time. She appears well-developed and well-nourished. No distress.  HENT:  Head: Normocephalic and atraumatic.  Eyes: Conjunctivae are normal. Pupils are equal, round, and reactive to light.  Pulmonary/Chest: No respiratory distress.  Musculoskeletal: She exhibits tenderness.  Neurological: She is alert and oriented to person, place, and time. No cranial nerve deficit.  Skin: Rash (Patches of crusting vesicles overlying an erythematous base spanning from right lumbar area down to her thigh and eventually underneath pannus overlying right groin area. Tender to palpation. Some swelling noted overlying the right gluteus. ) noted. She is not diaphoretic. No erythema (No warmth).            Assessment & Plan:

## 2015-09-17 NOTE — Assessment & Plan Note (Addendum)
Overview Blood pressure today is 163/77. She is requesting a refill of lisinopril 20 mg and Lipitor 40 mg though both were refilled by her PCP back in October for a 90 day supply. She however wanted to request a early refill so that she did not run out of medication.  Assessment Elevated blood pressure today likely in the setting of inadequately controlled pain. Per review of the chart, it appears that she has had fairly well-controlled blood pressures.   Plan -Refill lisinopril 20 mg and Lipitor 40 mg 30 tablets -Advised her to make an appointment with her PCP for additional refills

## 2015-09-17 NOTE — Assessment & Plan Note (Signed)
Overview Since her last visit, she reports that most of her lesions are clearing. Yesterday, she did note the onset of a "knot" just overlying her right buttock. She still feels that she is intermittently warm/cold when asked about fever and chills. She is still feeling nausea even with taking the tramadol/acetaminophen 37.5/325 mg every 6 hours as needed though feels that the medication does help her relax a bit and sleep. She completed her seven-day course of Valtrex today.  Assessment Shingles rash with neuralgia. Lesions appear improved and thus do not feel that her infection is worsening. No new patches were appreciated. Her pain is still inadequately controlled.  Plan -Refer the patient that she is showing signs of improvement -Prescribed Lidoderm patches to be placed for 12 hours with 12 hours of rest -Increased her tramadol/acetaminophen 37.5/325 mg from 1-1-2 tablets as needed every 6 hours -Advised her to give [place] a call back if her pain remains uncontrolled.

## 2015-09-17 NOTE — Patient Instructions (Addendum)
Thank you for bringing your medicines today. This helps [place] keep you safe from mistakes.     Please take lidoderm patches. Take them off every 12 hours.  I have refilled another 20 tablets for your pain medication. You can take 1-2 tablets as needed every 6 hours.   If no better, please come back next week.    Progress Toward Treatment Goals:  Treatment Goal 03/20/2015  Blood pressure at goal    Self Care Goals & Plans:  Self Care Goal 09/17/2015  Manage my medications take my medicines as prescribed; bring my medications to every visit; refill my medications on time  Monitor my health -  Eat healthy foods eat more vegetables; eat foods that are low in salt; eat baked foods instead of fried foods  Be physically active find an activity I enjoy  Meeting treatment goals -    No flowsheet data found.   Care Management & Community Referrals:  Referral 03/20/2015  Referrals made for care management support none needed  Referrals made to community resources -

## 2015-09-17 NOTE — Assessment & Plan Note (Signed)
Overview She is requesting a refill for Claritin 10 mg that she takes twice daily. Her symptoms are adequately controlled with this medication.  Assessment Seasonal allergic rhinitis, well-controlled with medication.  Plan Refilled Claritin today.

## 2015-09-17 NOTE — Progress Notes (Signed)
Internal Medicine Clinic Attending  Case discussed with Dr. Patel,Rushil at the time of the visit.  We reviewed the resident's history and exam and pertinent patient test results.  I agree with the assessment, diagnosis, and plan of care documented in the resident's note.  

## 2015-09-18 ENCOUNTER — Telehealth: Payer: Self-pay | Admitting: Internal Medicine

## 2015-09-18 NOTE — Telephone Encounter (Signed)
PA request completed online via [employer] [employer].  Request was denied, pt will need to try and fail Voltaren Gel first. Will forward info to prescribing MD and contact pt with medication change.  Please advise.[name]12/8/20161:36 PM

## 2015-09-18 NOTE — Telephone Encounter (Signed)
Spoke with pt, her insurance will not pay for lidocaine patches without authorization.  Spoke with Ulis Rias, she will look into and get me more information.  I will call pt when I have a resolution.

## 2015-09-18 NOTE — Telephone Encounter (Signed)
Pt requesting the nurse to call back regarding med.  °

## 2015-09-18 NOTE — Telephone Encounter (Signed)
Spoke with RN Ulis Rias to authorize this medication given her ongoing neuropathic pain in the setting of shingles rash.

## 2015-09-19 ENCOUNTER — Encounter (HOSPITAL_COMMUNITY): Payer: Self-pay | Admitting: General Practice

## 2015-09-19 ENCOUNTER — Emergency Department (HOSPITAL_COMMUNITY): Payer: Medicare Other

## 2015-09-19 ENCOUNTER — Other Ambulatory Visit: Payer: Self-pay

## 2015-09-19 ENCOUNTER — Ambulatory Visit: Payer: Medicare Other | Admitting: Internal Medicine

## 2015-09-19 ENCOUNTER — Emergency Department (HOSPITAL_COMMUNITY)
Admission: EM | Admit: 2015-09-19 | Discharge: 2015-09-19 | Disposition: A | Discharge status: Home / Self Care | Payer: Medicare Other | Attending: Emergency Medicine | Admitting: Emergency Medicine

## 2015-09-19 DIAGNOSIS — Z792 Long term (current) use of antibiotics: Secondary | ICD-10-CM | POA: Insufficient documentation

## 2015-09-19 DIAGNOSIS — Z79899 Other long term (current) drug therapy: Secondary | ICD-10-CM | POA: Insufficient documentation

## 2015-09-19 DIAGNOSIS — Y9389 Activity, other specified: Secondary | ICD-10-CM | POA: Insufficient documentation

## 2015-09-19 DIAGNOSIS — T783XXA Angioneurotic edema, initial encounter: Secondary | ICD-10-CM | POA: Diagnosis not present

## 2015-09-19 DIAGNOSIS — H269 Unspecified cataract: Secondary | ICD-10-CM | POA: Diagnosis not present

## 2015-09-19 DIAGNOSIS — Y9289 Other specified places as the place of occurrence of the external cause: Secondary | ICD-10-CM | POA: Insufficient documentation

## 2015-09-19 DIAGNOSIS — R21 Rash and other nonspecific skin eruption: Secondary | ICD-10-CM | POA: Insufficient documentation

## 2015-09-19 DIAGNOSIS — E785 Hyperlipidemia, unspecified: Secondary | ICD-10-CM | POA: Diagnosis not present

## 2015-09-19 DIAGNOSIS — I951 Orthostatic hypotension: Secondary | ICD-10-CM | POA: Diagnosis not present

## 2015-09-19 DIAGNOSIS — F329 Major depressive disorder, single episode, unspecified: Secondary | ICD-10-CM | POA: Diagnosis not present

## 2015-09-19 DIAGNOSIS — I10 Essential (primary) hypertension: Secondary | ICD-10-CM | POA: Diagnosis not present

## 2015-09-19 DIAGNOSIS — R220 Localized swelling, mass and lump, head: Secondary | ICD-10-CM | POA: Diagnosis not present

## 2015-09-19 DIAGNOSIS — Z8739 Personal history of other diseases of the musculoskeletal system and connective tissue: Secondary | ICD-10-CM | POA: Diagnosis not present

## 2015-09-19 DIAGNOSIS — X58XXXA Exposure to other specified factors, initial encounter: Secondary | ICD-10-CM | POA: Insufficient documentation

## 2015-09-19 DIAGNOSIS — R42 Dizziness and giddiness: Secondary | ICD-10-CM | POA: Diagnosis not present

## 2015-09-19 DIAGNOSIS — Y998 Other external cause status: Secondary | ICD-10-CM | POA: Diagnosis not present

## 2015-09-19 DIAGNOSIS — T464X5A Adverse effect of angiotensin-converting-enzyme inhibitors, initial encounter: Secondary | ICD-10-CM | POA: Insufficient documentation

## 2015-09-19 DIAGNOSIS — Z87891 Personal history of nicotine dependence: Secondary | ICD-10-CM | POA: Insufficient documentation

## 2015-09-19 DIAGNOSIS — Z8619 Personal history of other infectious and parasitic diseases: Secondary | ICD-10-CM | POA: Diagnosis not present

## 2015-09-19 DIAGNOSIS — Z872 Personal history of diseases of the skin and subcutaneous tissue: Secondary | ICD-10-CM | POA: Diagnosis not present

## 2015-09-19 LAB — CBC WITH DIFFERENTIAL/PLATELET
Basophils Absolute: 0 10*3/uL (ref 0.0–0.1)
Basophils Relative: 0 %
EOS PCT: 0 %
Eosinophils Absolute: 0 10*3/uL (ref 0.0–0.7)
HCT: 38 % (ref 36.0–46.0)
Hemoglobin: 12.2 g/dL (ref 12.0–15.0)
Lymphocytes Relative: 34 %
Lymphs Abs: 1.8 10*3/uL (ref 0.7–4.0)
MCH: 27.2 pg (ref 26.0–34.0)
MCHC: 32.1 g/dL (ref 30.0–36.0)
MCV: 84.6 fL (ref 78.0–100.0)
Monocytes Absolute: 0.3 10*3/uL (ref 0.1–1.0)
Monocytes Relative: 6 %
Neutro Abs: 3.2 10*3/uL (ref 1.7–7.7)
Neutrophils Relative %: 60 %
PLATELETS: 172 10*3/uL (ref 150–400)
RBC: 4.49 MIL/uL (ref 3.87–5.11)
RDW: 14.2 % (ref 11.5–15.5)
WBC: 5.3 10*3/uL (ref 4.0–10.5)

## 2015-09-19 LAB — I-STAT TROPONIN, ED: Troponin i, poc: 0 ng/mL (ref 0.00–0.08)

## 2015-09-19 LAB — BASIC METABOLIC PANEL
Anion gap: 8 (ref 5–15)
BUN: 17 mg/dL (ref 6–20)
CHLORIDE: 99 mmol/L — AB (ref 101–111)
CO2: 29 mmol/L (ref 22–32)
CREATININE: 0.87 mg/dL (ref 0.44–1.00)
Calcium: 9.3 mg/dL (ref 8.9–10.3)
GFR calc Af Amer: >60 mL/min (ref >60)
GFR calc non Af Amer: >60 mL/min (ref >60)
Glucose, Bld: 109 mg/dL — ABNORMAL HIGH (ref 65–99)
Potassium: 4.1 mmol/L (ref 3.5–5.1)
Sodium: 136 mmol/L (ref 135–145)

## 2015-09-19 MED ORDER — LORAZEPAM 2 MG/ML IJ SOLN
1.0000 mg | Freq: Once | INTRAMUSCULAR | Status: AC
  Administered 2015-09-19: 1 mg via INTRAVENOUS
  Filled 2015-09-19: qty 1

## 2015-09-19 MED ORDER — METHYLPREDNISOLONE SODIUM SUCC 125 MG IJ SOLR
125.0000 mg | Freq: Once | INTRAMUSCULAR | Status: AC
  Administered 2015-09-19: 125 mg via INTRAVENOUS
  Filled 2015-09-19: qty 2

## 2015-09-19 MED ORDER — IOHEXOL 300 MG/ML  SOLN: 75.0000 mL | Freq: Once | INTRAMUSCULAR | Status: DC | PRN

## 2015-09-19 MED ORDER — PREDNISONE 10 MG PO TABS: 40.0000 mg | ORAL_TABLET | Freq: Every day | ORAL | Status: DC

## 2015-09-19 MED ORDER — FAMOTIDINE IN NACL 20-0.9 MG/50ML-% IV SOLN
20.0000 mg | Freq: Once | INTRAVENOUS | Status: AC
  Administered 2015-09-19: 20 mg via INTRAVENOUS
  Filled 2015-09-19: qty 50

## 2015-09-19 NOTE — ED Notes (Signed)
Pt in Radiology during hourly round

## 2015-09-19 NOTE — Discharge Instructions (Signed)
You were seen today for your facial swelling and dizziness.  The facial swelling is likely secondary to your BP medication Lisinopril.  DO NOT take this medication.  Follow up with your primary care physician regarding this as well as your dizziness.  Return immediately with worsening symptoms or if the swelling seems to be getting worse of you feel is is hard to breathe or swallow.  Angioedema Angioedema is a sudden swelling of tissues, often of the skin. It can occur on the face or genitals or in the abdomen or other body parts. The swelling usually develops over a short period and gets better in 24 to 48 hours. It often begins during the night and is found when the person wakes up. The person may also get red, itchy patches of skin (hives). Angioedema can be dangerous if it involves swelling of the air passages.  Depending on the cause, episodes of angioedema may only happen once, come back in unpredictable patterns, or repeat for several years and then gradually fade away.  CAUSES  Angioedema can be caused by an allergic reaction to various triggers. It can also result from nonallergic causes, including reactions to drugs, immune system disorders, viral infections, or an abnormal gene that is passed to you from your parents (hereditary). For some people with angioedema, the cause is unknown.  Some things that can trigger angioedema include:   Foods.   Medicines, such as ACE inhibitors, ARBs, nonsteroidal anti-inflammatory agents, or estrogen.   Latex.   Animal saliva.   Insect stings.   Dyes used in X-rays.   Mild injury.   Dental work.  Surgery.  Stress.   Sudden changes in temperature.   Exercise. SIGNS AND SYMPTOMS   Swelling of the skin.  Hives. If these are present, there is also intense itching.  Redness in the affected area.   Pain in the affected area.  Swollen lips or tongue.  Breathing problems. This may happen if the air passages  swell.  Wheezing. If internal organs are involved, there may be:   Nausea.   Abdominal pain.   Vomiting.   Difficulty swallowing.   Difficulty passing urine. DIAGNOSIS   Your health care provider will examine the affected area and take a medical and family history.  Various tests may be done to help determine the cause. Tests may include:  Allergy skin tests to see if the problem is an allergic reaction.   Blood tests to check for hereditary angioedema.   Tests to check for underlying diseases that could cause the condition.   A review of your medicines, including over-the-counter medicines, may be done. TREATMENT  Treatment will depend on the cause of the angioedema. Possible treatments include:   Removal of anything that triggered the condition (such as stopping certain medicines).   Medicines to treat symptoms or prevent attacks. Medicines given may include:   Antihistamines.   Epinephrine injection.   Steroids.   Hospitalization may be required for severe attacks. If the air passages are affected, it can be an emergency. Tubes may need to be placed to keep the airway open. HOME CARE INSTRUCTIONS   Take all medicines as directed by your health care provider.  If you were given medicines for emergency allergy treatment, always carry them with you.  Wear a medical bracelet as directed by your health care provider.   Avoid known triggers. SEEK MEDICAL CARE IF:   You have repeat attacks of angioedema.   Your attacks are more frequent or  more severe despite preventive measures.   You have hereditary angioedema and are considering having children. It is important to discuss with your health care provider the risks of passing the condition on to your children. SEEK IMMEDIATE MEDICAL CARE IF:   You have severe swelling of the mouth, tongue, or lips.  You have difficulty breathing.   You have difficulty swallowing.   You faint. MAKE SURE  YOU:  Understand these instructions.  Will watch your condition.  Will get help right away if you are not doing well or get worse.   This information is not intended to replace advice given to you by your health care provider. Make sure you discuss any questions you have with your health care provider.   Document Released: 12/06/2001 Document Revised: 10/18/2014 Document Reviewed: 05/21/2013 Elsevier Interactive Patient Education [employer].

## 2015-09-19 NOTE — ED Notes (Signed)
Pt presents with complaints of left sided facial swelling that started this morning at 0530. Pt was seen in the emergency department last week for open shingle lesions. Pt is A/O but reports dizziness. Pt is neurologically intact.

## 2015-09-19 NOTE — ED Provider Notes (Signed)
CSN: [id number]     Arrival date & time 09/19/15  0707 History   First MD Initiated Contact with Patient 09/19/15 [phone]     Chief Complaint  Patient presents with  . Dizziness  . Facial Swelling     (Consider location/radiation/quality/duration/timing/severity/associated sxs/prior Treatment) HPI Comments: 79 y.o. Female with history of thyroid nodule, depression, palpitations, degenerative disc disease, palpitations, hyperlipidemia presents for left facial swelling and dizziness. The patient reports that she got up in the middle of the night around 3 AM and took her BP medications and felt fine.  She says that when she woke up this morning she noted that she was feeling off balance when she walks and that she is having swelling in the left face.  She reports that the swelling is not tender or painful.  Denies pain like this ever before.  No fever, chills, headache.  No weakness, is able to walk but feels off balance.  Wears dentures.  Patient is on lisinopril  Patient is a 79 y.o. female presenting with dizziness.  Dizziness Associated symptoms: no chest pain, no diarrhea, no nausea, no palpitations, no shortness of breath and no vomiting     Past Medical History  Diagnosis Date  . Hyperlipemia   . Hypertension   . Seasonal allergies   . Cataract   . Thyroid nodule   . Depression   . Pruritus   . Morbidly obese ([place])   . Impaired glucose tolerance    History reviewed. No pertinent past surgical history. Family History  Problem Relation Age of Onset  . Colon cancer Mother     deceased age 42  . Cancer Mother   . Multiple myeloma Father   . Cancer Father    Social History  Substance Use Topics  . Smoking status: Former Smoker    Quit date: 12/14/1984  . Smokeless tobacco: None  . Alcohol Use: No   OB History    No data available     Review of Systems  Constitutional: Negative for fever, chills and fatigue.  HENT: Positive for facial swelling (left facial swelling).  Negative for congestion, drooling, mouth sores, nosebleeds, postnasal drip, rhinorrhea, sinus pressure, sore throat and trouble swallowing.   Respiratory: Negative for cough, chest tightness and shortness of breath.   Cardiovascular: Negative for chest pain and palpitations.  Gastrointestinal: Negative for nausea, vomiting, abdominal pain and diarrhea.  Genitourinary: Negative for dysuria, urgency and hematuria.  Musculoskeletal: Negative for myalgias and back pain.  Skin: Positive for rash (right flank - diagnosed with singles).  Neurological: Positive for dizziness and facial asymmetry (left facial swelling). Negative for seizures, light-headedness and numbness.  Hematological: Does not bruise/bleed easily.      Allergies  Influenza vaccines and Fexofenadine-pseudoephed er  Home Medications   Prior to Admission medications   Medication Sig Start Date End Date Taking? Authorizing Provider  atorvastatin (LIPITOR) 40 MG tablet Take 1 tablet (40 mg total) by mouth daily. 09/17/15   Rushil Sherrye Payor, MD  cephALEXin (KEFLEX) 500 MG capsule Take 1 capsule (500 mg total) by mouth 2 (two) times daily. 03/18/15   Carlisle Cater, PA-C  fluocinolone (SYNALAR) 0.01 % external solution Apply topically 2 (two) times daily. 09/23/14   Alexa Sherral Hammers, MD  ibuprofen (ADVIL,MOTRIN) 600 MG tablet take 1 tablet by mouth every 6 hours if needed 10/29/14   Aldine Contes, MD  ketoconazole (NIZORAL) 2 % shampoo Apply topically 2 (two) times a week. 09/23/14   Alexa Sherral Hammers, MD  lactase (LACTAID) 3000 UNIT tablet Take 1 tablet before milk products     Historical Provider, MD  lidocaine (LIDODERM) 5 % Place 1 patch onto the skin daily. Remove every 12 hours. 09/17/15   Rushil Sherrye Payor, MD  lisinopril (PRINIVIL,ZESTRIL) 20 MG tablet Take 1 tablet (20 mg total) by mouth daily. 09/17/15   Rushil Sherrye Payor, MD  loratadine (CLARITIN) 10 MG tablet take 1 tablet by mouth twice a day if needed for itching 09/17/15    Riccardo Dubin, MD  phenazopyridine (PYRIDIUM) 200 MG tablet Take 1 tablet (200 mg total) by mouth 3 (three) times daily. 03/18/15   Carlisle Cater, PA-C  predniSONE (DELTASONE) 10 MG tablet Take 4 tablets (40 mg total) by mouth daily. 09/19/15   Harvel Quale, MD  traMADol-acetaminophen (ULTRACET) 37.5-325 MG tablet Take 1-2 tablets by mouth every 6 (six) hours as needed. 09/17/15   Rushil Sherrye Payor, MD   BP 126/67 mmHg  Pulse 90  Temp(Src) 97.8 F (36.6 C) (Oral)  Resp 16  Ht $R'5\' 8"'wD$  (1.727 m)  Wt 305 lb (138.347 kg)  BMI 46.39 kg/m2  SpO2 98%  LMP 01/12/1972 Physical Exam  Constitutional: She is oriented to person, place, and time. She appears well-developed and well-nourished. No distress.  HENT:  Head: Normocephalic and atraumatic.  Right Ear: External ear normal.  Left Ear: External ear normal.  Nose: Nose normal.  Mouth/Throat: Oropharynx is clear and moist. Mucous membranes are not dry. No dental abscesses or dental caries. No oropharyngeal exudate, posterior oropharyngeal edema, posterior oropharyngeal erythema or tonsillar abscesses.  Left facial swelling, no fluctuance.  No erythema.  No teeth.  Eyes: EOM are normal. Pupils are equal, round, and reactive to light.  Neck: Normal range of motion. Neck supple.  Cardiovascular: Normal rate, regular rhythm, normal heart sounds and intact distal pulses.   No murmur heard. Pulmonary/Chest: Effort normal. No respiratory distress. She has no wheezes. She has no rales.  Abdominal: Soft. She exhibits no distension. There is no tenderness.  Musculoskeletal: Normal range of motion. She exhibits no edema or tenderness.  Neurological: She is alert and oriented to person, place, and time. She has normal strength. No cranial nerve deficit or sensory deficit. She exhibits normal muscle tone. She displays a negative Romberg sign. Coordination and gait normal.  Normal finger to nose  Skin: Skin is warm and dry. No rash noted. She is not  diaphoretic.  Vitals reviewed.   ED Course  Procedures (including critical care time) Labs Review Labs Reviewed  BASIC METABOLIC PANEL - Abnormal; Notable for the following:    Chloride 99 (*)    Glucose, Bld 109 (*)    All other components within normal limits  CBC WITH DIFFERENTIAL/PLATELET  Randolm Idol, ED    Imaging Review Dg Chest 2 View  09/19/2015  CLINICAL DATA:  Dizziness and facial swelling EXAM: CHEST  2 VIEW COMPARISON:  07/05/2007 FINDINGS: Heart size is normal. Mediastinal shadows normal except for slight unfolding of the thoracic aorta. The lungs are clear. No effusions. No significant bone finding. IMPRESSION: No active disease Electronically Signed   By: Nelson Chimes M.D.   On: 09/19/2015 08:37   I have personally reviewed and evaluated these images and lab results as part of my medical decision-making.   EKG Interpretation   Date/Time:  Friday September 19 2015 07:36:19 EST Ventricular Rate:  93 PR Interval:  171 QRS Duration: 90 QT Interval:  355 QTC Calculation: 441 R Axis:  25 Text Interpretation:  Sinus rhythm Abnormal R-wave progression, early  transition No significant change since last tracing Confirmed by Ayauna Mcnay,  Aaro Meyers ([id number]) on 09/19/2015 8:13:36 AM      MDM  Patient was seen and evaluated in stable condition.  Mild left facial swelling.  Complaining of new onset dizziness >4 hours from time of presentation.  No ataxia or neurologic deficits on examination.  Attempted to get CT multiple times even after giving the patient ATivan without success.  Discussed the case with neurology who saw the patient bedside and felt that it was very unlikely that the patient had a posterior stoke that her symptoms were more wooziness than dizziness and also did not find abnormality on examination.  They did recommend CT if possible but patient was still unable to tolerate exam.  The reason for wanting to obtain examination and concern was discussed at length  with patient who expressed understanding and agreement.  Neurology aware that she was not able to tolerate CT and just recommended outpatient follow up.  Patient's facial swelling initially got some what worse and she was given pepcid and solumedrol after which she had great improvement and was monitered in the ER for multiple hours without worsening.  She never had any airway compromise.  Discussed with patient that swelling was likely secondary to lisinopril and that she must not take the medication ever again and must return immediately with worsening swelling.  She expressed understanding of this and said she would follow up with her PCP as soon aspossible.  Patient was discharged home in stable condition. Final diagnoses:  Left facial swelling    1. Angioedema, likely secondary to lisinopril  2. Dizziness    Harvel Quale, MD 09/20/15 [phone]

## 2015-09-19 NOTE — Consult Note (Signed)
NEURO HOSPITALIST CONSULT NOTE   Requestig physician: Dr. Alfonse Spruce   Reason for Consult: dizziness  HPI:                                                                                                                                          Christine [name] is an 79 y.o. female who awoke this AM feeling fine.  When she stood up and went to the bathroom she looked in the mirror and noted her face was swollen then noted she felt "woozy and dizzy". She sat down and the symptoms stopped. She came to the ED via private car. When getting out of the car she again felt dizzy and her balance was off but not falling to once side or the other.  She denies vertigo, N/V, weakness, diplopia, blurred vision.  Symptoms always stops when she sits down.   Past Medical History  Diagnosis Date  . Hyperlipemia   . Hypertension   . Seasonal allergies   . Cataract   . Thyroid nodule   . Depression   . Pruritus   . Morbidly obese ([place])   . Impaired glucose tolerance     History reviewed. No pertinent past surgical history.  Family History  Problem Relation Age of Onset  . Colon cancer Mother     deceased age 94  . Cancer Mother   . Multiple myeloma Father   . Cancer Father      Social History:  reports that she quit smoking about 30 years ago. She does not have any smokeless tobacco history on file. She reports that she does not drink alcohol or use illicit drugs.  Allergies  Allergen Reactions  . Influenza Vaccines Hives  . Fexofenadine-Pseudoephed Er Itching and Rash    Causes itching and rash    MEDICATIONS:                                                                                                                     Current Facility-Administered Medications  Medication Dose Route Frequency Provider Last Rate Last Dose  . famotidine (PEPCID) IVPB 20 mg premix  20 mg Intravenous Once Harvel Quale, MD      . iohexol (OMNIPAQUE) 300 MG/ML solution 75 mL  75 mL  Intravenous  Once PRN Harvel Quale, MD      . methylPREDNISolone sodium succinate (SOLU-MEDROL) 125 mg/2 mL injection 125 mg  125 mg Intravenous Once Harvel Quale, MD       Current Outpatient Prescriptions  Medication Sig Dispense Refill  . atorvastatin (LIPITOR) 40 MG tablet Take 1 tablet (40 mg total) by mouth daily. 30 tablet 0  . cephALEXin (KEFLEX) 500 MG capsule Take 1 capsule (500 mg total) by mouth 2 (two) times daily. 14 capsule 0  . fluocinolone (SYNALAR) 0.01 % external solution Apply topically 2 (two) times daily. 60 mL 6  . ibuprofen (ADVIL,MOTRIN) 600 MG tablet take 1 tablet by mouth every 6 hours if needed 30 tablet 0  . ketoconazole (NIZORAL) 2 % shampoo Apply topically 2 (two) times a week. 120 mL 6  . lactase (LACTAID) 3000 UNIT tablet Take 1 tablet before milk products     . lidocaine (LIDODERM) 5 % Place 1 patch onto the skin daily. Remove every 12 hours. 15 patch 0  . lisinopril (PRINIVIL,ZESTRIL) 20 MG tablet Take 1 tablet (20 mg total) by mouth daily. 30 tablet 0  . loratadine (CLARITIN) 10 MG tablet take 1 tablet by mouth twice a day if needed for itching 180 tablet 1  . phenazopyridine (PYRIDIUM) 200 MG tablet Take 1 tablet (200 mg total) by mouth 3 (three) times daily. 6 tablet 0  . traMADol-acetaminophen (ULTRACET) 37.5-325 MG tablet Take 1-2 tablets by mouth every 6 (six) hours as needed. 20 tablet 0      ROS:                                                                                                                                       History obtained from the patient  General ROS: negative for - chills, fatigue, fever, night sweats, weight gain or weight loss Psychological ROS: negative for - behavioral disorder, hallucinations, memory difficulties, mood swings or suicidal ideation Ophthalmic ROS: negative for - blurry vision, double vision, eye pain or loss of vision ENT ROS: negative for - epistaxis, nasal discharge, oral lesions, sore throat,  tinnitus or vertigo Allergy and Immunology ROS: negative for - hives or itchy/watery eyes Hematological and Lymphatic ROS: negative for - bleeding problems, bruising or swollen lymph nodes Endocrine ROS: negative for - galactorrhea, hair pattern changes, polydipsia/polyuria or temperature intolerance Respiratory ROS: negative for - cough, hemoptysis, shortness of breath or wheezing Cardiovascular ROS: negative for - chest pain, dyspnea on exertion, edema or irregular heartbeat Gastrointestinal ROS: negative for - abdominal pain, diarrhea, hematemesis, nausea/vomiting or stool incontinence Genito-Urinary ROS: negative for - dysuria, hematuria, incontinence or urinary frequency/urgency Musculoskeletal ROS: negative for - joint swelling or muscular weakness Neurological ROS: as noted in HPI Dermatological ROS: negative for rash and skin lesion changes   Blood pressure 140/90, pulse 95, temperature 97.8 F (36.6 C), temperature source Oral, resp. rate 18, height 5'  8" (1.727 m), weight 138.347 kg (305 lb), last menstrual period 01/12/1972, SpO2 98 %.   Neurologic Examination:                                                                                                      HEENT-  Normocephalic, no lesions, without obvious abnormality.  Normal external eye and conjunctiva.  Normal TM's bilaterally.  Normal auditory canals and external ears. Normal external nose, mucus membranes and septum.  Normal pharynx. Cardiovascular- S1, S2 normal, pulses palpable throughout   Lungs- chest clear, no wheezing, rales, normal symmetric air entry Abdomen- normal findings: bowel sounds normal Extremities- no edema Lymph-no adenopathy palpable Musculoskeletal-no joint tenderness, deformity or swelling Skin-warm and dry, no hyperpigmentation, vitiligo, or suspicious lesions  Neurological Examination Mental Status: Alert, oriented, thought content appropriate.  Speech fluent without evidence of aphasia.  Able  to follow 3 step commands without difficulty. Cranial Nerves: II: Discs flat bilaterally; Visual fields grossly normal, pupils equal, round, reactive to light and accommodation III,IV, VI: ptosis not present, extra-ocular motions intact bilaterally V,VII: smile symmetric, facial light touch sensation normal bilaterally VIII: hearing normal bilaterally IX,X: uvula rises symmetrically XI: bilateral shoulder shrug XII: midline tongue extension Motor: Right : Upper extremity   5/5    Left:     Upper extremity   5/5  Lower extremity   5/5     Lower extremity   5/5 Tone and bulk:normal tone throughout; no atrophy noted Sensory: Pinprick and light touch intact throughout, bilaterally Deep Tendon Reflexes: 1+ and symmetric throughout Plantars: Mute bilaterally Cerebellar: normal finger-to-nose and normal heel-to-shin test Gait: not tested      Lab Results: Basic Metabolic Panel:  Recent Labs Lab 09/19/15 0744  NA 136  K 4.1  CL 99*  CO2 29  GLUCOSE 109*  BUN 17  CREATININE 0.87  CALCIUM 9.3    Liver Function Tests: No results for input(s): AST, ALT, ALKPHOS, BILITOT, PROT, ALBUMIN in the last 168 hours. No results for input(s): LIPASE, AMYLASE in the last 168 hours. No results for input(s): AMMONIA in the last 168 hours.  CBC:  Recent Labs Lab 09/19/15 0744  WBC 5.3  NEUTROABS 3.2  HGB 12.2  HCT 38.0  MCV 84.6  PLT 172    Cardiac Enzymes: No results for input(s): CKTOTAL, CKMB, CKMBINDEX, TROPONINI in the last 168 hours.  Lipid Panel: No results for input(s): CHOL, TRIG, HDL, CHOLHDL, VLDL, LDLCALC in the last 168 hours.  CBG: No results for input(s): GLUCAP in the last 168 hours.  Microbiology: Results for orders placed or performed during the hospital encounter of 03/18/15  Urine culture     Status: None   Collection Time: 03/18/15  6:34 AM  Result Value Ref Range Status   Specimen Description URINE, RANDOM  Final   Special Requests Normal  Final    Colony Count   Final    >=100,000 COLONIES/ML Performed at [facility]    Culture   Final    ESCHERICHIA COLI Performed at [facility]    Report Status  03/20/2015 FINAL  Final   Organism ID, Bacteria ESCHERICHIA COLI  Final      Susceptibility   Escherichia coli - MIC*    AMPICILLIN <=2 SENSITIVE Sensitive     CEFAZOLIN <=4 SENSITIVE Sensitive     CEFTRIAXONE <=1 SENSITIVE Sensitive     CIPROFLOXACIN <=0.25 SENSITIVE Sensitive     GENTAMICIN <=1 SENSITIVE Sensitive     LEVOFLOXACIN <=0.12 SENSITIVE Sensitive     NITROFURANTOIN <=16 SENSITIVE Sensitive     TOBRAMYCIN <=1 SENSITIVE Sensitive     TRIMETH/SULFA <=20 SENSITIVE Sensitive     PIP/TAZO <=4 SENSITIVE Sensitive     * ESCHERICHIA COLI    Coagulation Studies: No results for input(s): LABPROT, INR in the last 72 hours.  Imaging: Dg Chest 2 View  09/19/2015  CLINICAL DATA:  Dizziness and facial swelling EXAM: CHEST  2 VIEW COMPARISON:  07/05/2007 FINDINGS: Heart size is normal. Mediastinal shadows normal except for slight unfolding of the thoracic aorta. The lungs are clear. No effusions. No significant bone finding. IMPRESSION: No active disease Electronically Signed   By: Nelson Chimes M.D.   On: 09/19/2015 08:37       Assessment and plan per attending neurologist  Etta Quill PA-C [facility] [phone]  09/19/2015, 9:55 AM I have seen and evaluated the patient. I have reviewed the above note and made appropriate changes.      Assessment/Plan: 79 year old female with transient lightheadedness after standing on 2 occasions. Each time she improves sitting down. She denies any vertigo, diplopia, other signs of posterior circulation disease. I think that is low likelihood that this represents focal ischemia with favor being related to either orthostasis or possibly her angioedema. She attempted to do a CT scan with sedation, but was unable to tolerate it due to claustrophobia. I do not  feel that the my current index of suspicion for primary neurological problem merits the risks of conscious sedation therefore if unable to get, then I do think this could be skipped.[name]  1) check orthostatics 2) no further recommendations from a neurological standpoint. Please call with any further questions or concerns.  Roland Rack, MD [facility] [phone]  If 7pm- 7am, please page neurology on call as listed in [place].

## 2015-09-19 NOTE — Telephone Encounter (Signed)
Upon further review of pt's chart and repeat call to [facility] was found that pt has a medicare d prescription plan.  Pharmacy was submitting rx under pt's medicaid plan and it was denied.  Pt's medicare plan also needed a PA, but was approved based upon the fact that medication will be used for post-herpetic neuralgia.  Request approved through Dec. 9th 2017. Will contact pt and pharmacy to make them aware.[name]12/9/20161:55 PM           Medicare [phone] Case# [id number]

## 2015-09-25 ENCOUNTER — Ambulatory Visit (INDEPENDENT_AMBULATORY_CARE_PROVIDER_SITE_OTHER): Payer: Medicare Other | Admitting: Internal Medicine

## 2015-09-25 ENCOUNTER — Encounter: Payer: Self-pay | Admitting: Internal Medicine

## 2015-09-25 VITALS — BP 154/65 | HR 96 | Temp 97.6°F | Ht 68.0 in | Wt 298.4 lb

## 2015-09-25 DIAGNOSIS — T783XXD Angioneurotic edema, subsequent encounter: Secondary | ICD-10-CM | POA: Diagnosis present

## 2015-09-25 DIAGNOSIS — T464X5A Adverse effect of angiotensin-converting-enzyme inhibitors, initial encounter: Secondary | ICD-10-CM | POA: Insufficient documentation

## 2015-09-25 DIAGNOSIS — T464X5D Adverse effect of angiotensin-converting-enzyme inhibitors, subsequent encounter: Secondary | ICD-10-CM

## 2015-09-25 DIAGNOSIS — B029 Zoster without complications: Secondary | ICD-10-CM | POA: Diagnosis not present

## 2015-09-25 DIAGNOSIS — I10 Essential (primary) hypertension: Secondary | ICD-10-CM

## 2015-09-25 DIAGNOSIS — T783XXA Angioneurotic edema, initial encounter: Secondary | ICD-10-CM

## 2015-09-25 MED ORDER — AMLODIPINE BESYLATE 5 MG PO TABS
5.0000 mg | ORAL_TABLET | Freq: Every day | ORAL | Status: DC
Start: 2015-09-25 — End: 2015-10-09

## 2015-09-25 NOTE — Assessment & Plan Note (Signed)
Overview On 12/9, she presented to the emergency department with several hours of left facial swelling and dizziness that woke her up in the morning. In terms improved with her blood pressure medications. Neurology assessed the patient at bedside and did not feel her findings were suggestive of a posterior stroke. Her symptoms were thought to be angioedema in the setting of lisinopril. She did not have any signs of airway compromise and was treated with Pepcid and Solu-Medrol. She was discharged in stable condition with prednisone 40 mg 4 days.  Today, she reports completing the prednisone treatment and resolution of her symptoms. She confirms the details of the history as outlined above. She describes her symptoms as "felt like mouth was full of something" also noted lip swelling. She recalls a similar incident when she took Allegra-D some years ago where she developed itching hives between the toes and fingers. While she was a child, she recalls her mother developing tongue swelling when exposed to Clorox which resolved with Benadryl.  Assessment ACE inhibitor related angioedema. Family history is also notable for similar symptoms though unclear if she does have a hereditary syndrome.  Plan -Discontinue lisinopril in place as contraindicated agent in her allergy list -Start amlodipine 5 mg today. Per review of the chart, she was previously on this medication though was discontinued after she complained of leg swelling. She had also tried HCTZ in the past though was limiting her quality of life given frequent urinary incontinence.

## 2015-09-25 NOTE — Assessment & Plan Note (Addendum)
Overview She reports no new lesions since she has been last seen. Her current lesions were showing resolution. Pain has markedly improved, and she no longer needs to use the patch or take pain medication.  Assessment Shingles rash, in recovery. Appears she may not have post-herpetic neuralgia.  Plan -Follow up as needed

## 2015-09-25 NOTE — Assessment & Plan Note (Signed)
Overview Blood pressure 154/65 today. She has not been taking lisinopril 20 mg since her visit to the emergency department 6 days ago after which it was assessed her symptoms may be related to ACE inhibitor therapy.  Assessment Essential hypertension, moderately elevated. Per review of the chart,  she was previously on amlodipine though it was discontinued after she complained of leg swelling. She had also tried HCTZ in the past though was limiting her quality of life given frequent urinary incontinence.  Plan -Start amlodipine 5 mg today after reviewing the side effect profile of both this and the diuretic -Follow-up in one month for blood pressure recheck

## 2015-09-25 NOTE — Progress Notes (Signed)
   Subjective:    Patient ID: Christine [name], female    DOB: 1936/05/01, 79 y.o.   MRN: [id number]  HPI Christine [name] is a 79 year old female with hypertension, GERD, morbid obesity, shingles rash who presents today for new rash. Please see assessment & plan for status of chronic medical problems.   Review of Systems  HENT: Positive for facial swelling and trouble swallowing.   Respiratory:       Difficulty breathing  Gastrointestinal: Positive for nausea. Negative for vomiting and abdominal pain.  Skin: Negative for rash.       No itching       Objective:   Physical Exam  Constitutional: She is oriented to person, place, and time. She appears well-developed and well-nourished. No distress.  HENT:  Head: Normocephalic and atraumatic.  Mouth/Throat: Oropharynx is clear and moist. No oropharyngeal exudate.  No facial or lip swelling noted.  Eyes: Conjunctivae are normal. Pupils are equal, round, and reactive to light.  Neck: Normal range of motion. Neck supple. No tracheal deviation present.  Pulmonary/Chest: No stridor. No respiratory distress.  Musculoskeletal: She exhibits tenderness.  Lymphadenopathy:    She has no cervical adenopathy.  Neurological: She is alert and oriented to person, place, and time. No cranial nerve deficit.  Skin: Rash (Patches of crusting vesicles which show improvement from prior visit. no tenderness to palpation.) noted. She is not diaphoretic. No erythema (No warmth).          Assessment & Plan:

## 2015-09-25 NOTE — Patient Instructions (Signed)
STOP lisinopril. Start taking amlodipine 5 mg. If you have any worsening rash, itching, facial or lip swelling, please give [place] a call back. If it becomes too difficult for you to breathe, please call 911.  We look forward to seeing you back in 2 weeks.

## 2015-09-30 NOTE — Progress Notes (Signed)
Internal Medicine Clinic Attending  Case discussed with Dr. Patel,Rushil soon after the resident saw the patient.  We reviewed the resident's history and exam and pertinent patient test results.  I agree with the assessment, diagnosis, and plan of care documented in the resident's note. 

## 2015-10-08 ENCOUNTER — Telehealth: Payer: Self-pay | Admitting: Internal Medicine

## 2015-10-08 NOTE — Telephone Encounter (Signed)
Call to patient to confirm appointment for 10/08/15 at 8:45 lmtcb

## 2015-10-09 ENCOUNTER — Ambulatory Visit (INDEPENDENT_AMBULATORY_CARE_PROVIDER_SITE_OTHER): Payer: Medicare Other | Admitting: Internal Medicine

## 2015-10-09 ENCOUNTER — Encounter: Payer: Self-pay | Admitting: Internal Medicine

## 2015-10-09 VITALS — BP 157/79 | HR 88 | Temp 97.9°F | Ht 68.0 in | Wt 297.5 lb

## 2015-10-09 DIAGNOSIS — Z0000 Encounter for general adult medical examination without abnormal findings: Secondary | ICD-10-CM | POA: Insufficient documentation

## 2015-10-09 DIAGNOSIS — I10 Essential (primary) hypertension: Secondary | ICD-10-CM

## 2015-10-09 DIAGNOSIS — Z87891 Personal history of nicotine dependence: Secondary | ICD-10-CM | POA: Diagnosis not present

## 2015-10-09 MED ORDER — ATORVASTATIN CALCIUM 40 MG PO TABS: 40.0000 mg | ORAL_TABLET | Freq: Every day | ORAL | Status: DC

## 2015-10-09 MED ORDER — AMLODIPINE BESYLATE 10 MG PO TABS: 10.0000 mg | ORAL_TABLET | Freq: Every day | ORAL | Status: DC

## 2015-10-09 NOTE — Assessment & Plan Note (Addendum)
Pt needs pneumonia and influenza vaccine.  -will defer for now given recent zoster outbreak
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
## 2015-10-09 NOTE — Patient Instructions (Signed)
Thank you for your visit today.   Please return to the internal medicine clinic in 6 wks or sooner if needed.     I have made the following additions/changes to your medications:  We will increase your amlodipine to 10mg  daily.    Please be sure to bring all of your medications with you to every visit; this includes herbal supplements, vitamins, eye drops, and any over-the-counter medications.   Should you have any questions regarding your medications and/or any new or worsening symptoms, please be sure to call the clinic at [phone].   If you believe that you are suffering from a life threatening condition or one that may result in the loss of limb or function, then you should call 911 and proceed to the nearest Emergency Department.   A healthy lifestyle and preventative care can promote health and wellness.   Maintain regular health, dental, and eye exams.  Eat a healthy diet. Foods like vegetables, fruits, whole grains, low-fat dairy products, and lean protein foods contain the nutrients you need without too many calories. Decrease your intake of foods high in solid fats, added sugars, and salt. Get information about a proper diet from your caregiver, if necessary.  Regular physical exercise is one of the most important things you can do for your health. Most adults should get at least 150 minutes of moderate-intensity exercise (any activity that increases your heart rate and causes you to sweat) each week. In addition, most adults need muscle-strengthening exercises on 2 or more days a week.   Maintain a healthy weight. The body mass index (BMI) is a screening tool to identify possible weight problems. It provides an estimate of body fat based on height and weight. Your caregiver can help determine your BMI, and can help you achieve or maintain a healthy weight. For adults 20 years and older:  A BMI below 18.5 is considered underweight.  A BMI of 18.5 to 24.9 is normal.  A BMI  of 25 to 29.9 is considered overweight.  A BMI of 30 and above is considered obese.  Amlodipine tablets What is this medicine? AMLODIPINE (am LOE di peen) is a calcium-channel blocker. It affects the amount of calcium found in your heart and muscle cells. This relaxes your blood vessels, which can reduce the amount of work the heart has to do. This medicine is used to lower high blood pressure. It is also used to prevent chest pain. This medicine may be used for other purposes; ask your health care provider or pharmacist if you have questions. What should I tell my health care provider before I take this medicine? They need to know if you have any of these conditions: -heart problems like heart failure or aortic stenosis -liver disease -an unusual or allergic reaction to amlodipine, other medicines, foods, dyes, or preservatives -pregnant or trying to get pregnant -breast-feeding How should I use this medicine? Take this medicine by mouth with a glass of water. Follow the directions on the prescription label. Take your medicine at regular intervals. Do not take more medicine than directed. Talk to your pediatrician regarding the use of this medicine in children. Special care may be needed. This medicine has been used in children as young as 6. Persons over 56 years old may have a stronger reaction to this medicine and need smaller doses. Overdosage: If you think you have taken too much of this medicine contact a poison control center or emergency room at once. NOTE: This medicine is  only for you. Do not share this medicine with others. What if I miss a dose? If you miss a dose, take it as soon as you can. If it is almost time for your next dose, take only that dose. Do not take double or extra doses. What may interact with this medicine? -herbal or dietary supplements -local or general anesthetics -medicines for high blood pressure -medicines for prostate problems -rifampin This list may  not describe all possible interactions. Give your health care provider a list of all the medicines, herbs, non-prescription drugs, or dietary supplements you use. Also tell them if you smoke, drink alcohol, or use illegal drugs. Some items may interact with your medicine. What should I watch for while using this medicine? Visit your doctor or health care professional for regular check ups. Check your blood pressure and pulse rate regularly. Ask your health care professional what your blood pressure and pulse rate should be, and when you should contact him or her. This medicine may make you feel confused, dizzy or lightheaded. Do not drive, use machinery, or do anything that needs mental alertness until you know how this medicine affects you. To reduce the risk of dizzy or fainting spells, do not sit or stand up quickly, especially if you are an older patient. Avoid alcoholic drinks; they can make you more dizzy. Do not suddenly stop taking amlodipine. Ask your doctor or health care professional how you can gradually reduce the dose. What side effects may I notice from receiving this medicine? Side effects that you should report to your doctor or health care professional as soon as possible: -allergic reactions like skin rash, itching or hives, swelling of the face, lips, or tongue -breathing problems -changes in vision or hearing -chest pain -fast, irregular heartbeat -swelling of legs or ankles Side effects that usually do not require medical attention (report to your doctor or health care professional if they continue or are bothersome): -dry mouth -facial flushing -nausea, vomiting -stomach gas, pain -tired, weak -trouble sleeping This list may not describe all possible side effects. Call your doctor for medical advice about side effects. You may report side effects to FDA at 1-800-FDA-1088. Where should I keep my medicine? Keep out of the reach of children. Store at room temperature  between 59 and 86 degrees F (15 and 30 degrees C). Protect from light. Keep container tightly closed. Throw away any unused medicine after the expiration date. NOTE: This sheet is a summary. It may not cover all possible information. If you have questions about this medicine, talk to your doctor, pharmacist, or health care provider.    2016, Elsevier/Gold Standard. (2012-08-25 11:40:58)

## 2015-10-09 NOTE — Progress Notes (Signed)
Case discussed with Dr. Gill soon after the resident saw the patient.  We reviewed the resident's history and exam and pertinent patient test results.  I agree with the assessment, diagnosis, and plan of care documented in the resident's note. 

## 2015-10-09 NOTE — Assessment & Plan Note (Addendum)
Pt recently started on amlodipine 5mg  daily after having presumed angioedema due to ACEi.  BP today is 157/79.   -will increase amlodipine today to 10mg  daily -f/u in 6 wks for BP recheck

## 2015-10-10 ENCOUNTER — Other Ambulatory Visit: Payer: Self-pay | Admitting: Internal Medicine

## 2015-10-11 ENCOUNTER — Other Ambulatory Visit: Payer: Self-pay | Admitting: Internal Medicine

## 2015-10-21 ENCOUNTER — Other Ambulatory Visit: Payer: Self-pay | Admitting: Internal Medicine

## 2015-10-22 ENCOUNTER — Telehealth: Payer: Self-pay | Admitting: Internal Medicine

## 2015-10-22 NOTE — Telephone Encounter (Signed)
Patient states that her blood pressure medication  Has been changed and she has been having swelling in her feet legs since starting the new medication.

## 2015-10-22 NOTE — Telephone Encounter (Signed)
Attempted to call pt back, lm for rtc

## 2015-10-23 NOTE — Telephone Encounter (Signed)
Phone number goes straight to VM, left another message

## 2015-10-28 ENCOUNTER — Telehealth: Payer: Self-pay | Admitting: Internal Medicine

## 2015-10-28 NOTE — Telephone Encounter (Signed)
Yes, pt to hold her next dose (takes in the morning).  She will see Dr. Lovena Le [phone] tomorrow.

## 2015-10-28 NOTE — Telephone Encounter (Signed)
Pt states the amlodipine is causing her to swell on both hand and feet. Please call pt back.

## 2015-10-28 NOTE — Telephone Encounter (Signed)
She needs to be seen in [facility]. She can hold her medicine till tomorrow and see if she can be seen in the clinic tomorrow. Thanks

## 2015-10-28 NOTE — Telephone Encounter (Signed)
Thank you :)

## 2015-10-28 NOTE — Telephone Encounter (Signed)
Spoke with patient.  She reports swelling to her bilateral ankles after taking her amlodipine.  Pt states left is greater than right, (left has been swelling for "couple of weeks" and right just started a few days ago.  Both ankles/feet will swell during the day after she takes her daily dose and then before bed she notices them starting to go back down.  Pt concerned and wonders if she should continue taking it.  No swelling to her face or lips.  No other swelling or symptoms reported.   Please advise.

## 2015-10-29 ENCOUNTER — Ambulatory Visit (INDEPENDENT_AMBULATORY_CARE_PROVIDER_SITE_OTHER): Payer: Medicare Other | Admitting: Internal Medicine

## 2015-10-29 ENCOUNTER — Other Ambulatory Visit: Payer: Self-pay | Admitting: Internal Medicine

## 2015-10-29 ENCOUNTER — Encounter: Payer: Self-pay | Admitting: Internal Medicine

## 2015-10-29 VITALS — BP 136/65 | HR 101 | Temp 97.5°F | Wt 298.9 lb

## 2015-10-29 DIAGNOSIS — Z79899 Other long term (current) drug therapy: Secondary | ICD-10-CM | POA: Diagnosis not present

## 2015-10-29 DIAGNOSIS — I10 Essential (primary) hypertension: Secondary | ICD-10-CM | POA: Diagnosis present

## 2015-10-29 DIAGNOSIS — E785 Hyperlipidemia, unspecified: Secondary | ICD-10-CM

## 2015-10-29 DIAGNOSIS — Z87891 Personal history of nicotine dependence: Secondary | ICD-10-CM | POA: Diagnosis not present

## 2015-10-29 DIAGNOSIS — J309 Allergic rhinitis, unspecified: Secondary | ICD-10-CM

## 2015-10-29 MED ORDER — AMLODIPINE BESYLATE 10 MG PO TABS: 10.0000 mg | ORAL_TABLET | Freq: Every day | ORAL | Status: DC

## 2015-10-29 MED ORDER — ATORVASTATIN CALCIUM 40 MG PO TABS: 40.0000 mg | ORAL_TABLET | Freq: Every day | ORAL | Status: DC

## 2015-10-29 MED ORDER — LORATADINE 10 MG PO TABS: ORAL_TABLET | ORAL | Status: DC

## 2015-10-29 MED ORDER — AMLODIPINE BESYLATE 5 MG PO TABS: 10.0000 mg | ORAL_TABLET | Freq: Every day | ORAL | Status: DC

## 2015-10-29 MED ORDER — AMLODIPINE BESYLATE 5 MG PO TABS: 5.0000 mg | ORAL_TABLET | Freq: Every day | ORAL | Status: DC

## 2015-10-29 NOTE — Patient Instructions (Addendum)
1. Start taking Amlodipine 5 mg at night to reduce leg swelling.  This medication is for your blood pressure. 2. Buy compression stockings (Jobst or Sigvaris) 8-15 or 15-20 mmHg.  Ask the pharmacist to find the right stockings and size.  Amlodipine tablets What is this medicine? AMLODIPINE (am LOE di peen) is a calcium-channel blocker. It affects the amount of calcium found in your heart and muscle cells. This relaxes your blood vessels, which can reduce the amount of work the heart has to do. This medicine is used to lower high blood pressure. It is also used to prevent chest pain. This medicine may be used for other purposes; ask your health care provider or pharmacist if you have questions. What should I tell my health care provider before I take this medicine? They need to know if you have any of these conditions: -heart problems like heart failure or aortic stenosis -liver disease -an unusual or allergic reaction to amlodipine, other medicines, foods, dyes, or preservatives -pregnant or trying to get pregnant -breast-feeding How should I use this medicine? Take this medicine by mouth with a glass of water. Follow the directions on the prescription label. Take your medicine at regular intervals. Do not take more medicine than directed. Talk to your pediatrician regarding the use of this medicine in children. Special care may be needed. This medicine has been used in children as young as 6. Persons over 40 years old may have a stronger reaction to this medicine and need smaller doses. Overdosage: If you think you have taken too much of this medicine contact a poison control center or emergency room at once. NOTE: This medicine is only for you. Do not share this medicine with others. What if I miss a dose? If you miss a dose, take it as soon as you can. If it is almost time for your next dose, take only that dose. Do not take double or extra doses. What may interact with this  medicine? -herbal or dietary supplements -local or general anesthetics -medicines for high blood pressure -medicines for prostate problems -rifampin This list may not describe all possible interactions. Give your health care provider a list of all the medicines, herbs, non-prescription drugs, or dietary supplements you use. Also tell them if you smoke, drink alcohol, or use illegal drugs. Some items may interact with your medicine. What should I watch for while using this medicine? Visit your doctor or health care professional for regular check ups. Check your blood pressure and pulse rate regularly. Ask your health care professional what your blood pressure and pulse rate should be, and when you should contact him or her. This medicine may make you feel confused, dizzy or lightheaded. Do not drive, use machinery, or do anything that needs mental alertness until you know how this medicine affects you. To reduce the risk of dizzy or fainting spells, do not sit or stand up quickly, especially if you are an older patient. Avoid alcoholic drinks; they can make you more dizzy. Do not suddenly stop taking amlodipine. Ask your doctor or health care professional how you can gradually reduce the dose. What side effects may I notice from receiving this medicine? Side effects that you should report to your doctor or health care professional as soon as possible: -allergic reactions like skin rash, itching or hives, swelling of the face, lips, or tongue -breathing problems -changes in vision or hearing -chest pain -fast, irregular heartbeat -swelling of legs or ankles Side effects that usually do  not require medical attention (report to your doctor or health care professional if they continue or are bothersome): -dry mouth -facial flushing -nausea, vomiting -stomach gas, pain -tired, weak -trouble sleeping This list may not describe all possible side effects. Call your doctor for medical advice about  side effects. You may report side effects to FDA at 1-800-FDA-1088. Where should I keep my medicine? Keep out of the reach of children. Store at room temperature between 59 and 86 degrees F (15 and 30 degrees C). Protect from light. Keep container tightly closed. Throw away any unused medicine after the expiration date. NOTE: This sheet is a summary. It may not cover all possible information. If you have questions about this medicine, talk to your doctor, pharmacist, or health care provider.    2016, Elsevier/Gold Standard. (2012-08-25 11:40:58)
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
## 2015-10-29 NOTE — Assessment & Plan Note (Addendum)
Patient returns for BP check after increasing to Amlodipine 10 mg.  She was previously uncontrolled on Amlodipine 5 mg, after stopping Lisinopril 2/2 suspected angioedema.  Her BP is well controlled.  She denies CP or SOB.  She has intermittent lightheadedness with sudden rising.  She denies falls.  Her biggest side effect is bilateral leg swelling.  She denies calf pain.  The swelling is annoying to the patient, but she understands the BP medication is helping.  She was previously on Triamterene-HCTZ as well, but the medication was stopped 2/2 frequent urination.  Orthostatics: Patient symptomatic going from lying to sitting and standing. Lying: 144/67 (92) Sitting: 158/82 (101) Standing: 125/103 (102)  BP Readings from Last 3 Encounters:  10/29/15 136/65  10/09/15 157/79  09/25/15 154/65    Lab Results  Component Value Date   NA 136 09/19/2015   K 4.1 09/19/2015   CREATININE 0.87 09/19/2015    Assessment: Blood pressure control:  controlled Progress toward BP goal:    Comments: Patient experiencing symptomatic orthostatic hypotensions and borderline BP readings.  Risks likely outweigh benefits when considering falls in a 80 yo female.  Will decrease dose back to Amlodipine 5 mg daily to be taken at night to help with leg swelling.  Plan: Medications:  Decrease to Amlodipine 5 mg daily Educational resources provided:   Self management tools provided:   Other plans: Take at night and wear compression stockings during day to help with swelling.

## 2015-10-29 NOTE — Telephone Encounter (Signed)
When called # today, recording states phone has been disconnected

## 2015-10-30 NOTE — Progress Notes (Signed)
Internal Medicine Clinic Attending  Case discussed with Dr. Lovena Le at the time of the visit.  We reviewed the resident's history and exam and pertinent patient test results.  I agree with the assessment, diagnosis, and plan of care documented in the resident's note.  With positive orthostatic changes and typical light headed symptoms, I agree with decreasing antihypertensives and tolerating high BP targets in order to reduce risk of falls.

## 2015-11-07 ENCOUNTER — Encounter: Payer: Self-pay | Admitting: Licensed Clinical Social Worker

## 2015-11-07 NOTE — Progress Notes (Signed)
Patient ID: Christine [name], female   DOB: Aug 06, 1936, 80 y.o.   MRN: [id number] CSW received telephone voicemail from [facility] [phone] requesting follow up on pt's ICD 10 PCS transition form.  CSW returned call and notify [name], form was received this week and placed in PCP's mailbox for signature.  [name] states form was requested to be completed and returned by 11/11/15.  CSW informed  [name], [facility] requests 2 weeks for all written documentation.

## 2015-11-14 NOTE — Progress Notes (Signed)
Patient ID: Christine [name], female   DOB: 05-Mar-1936, 80 y.o.   MRN: [id number] Completed/signed ICD-10 Transition form faxed per request to [address], [place]

## 2015-11-18 ENCOUNTER — Other Ambulatory Visit: Payer: Self-pay | Admitting: Internal Medicine

## 2015-11-18 DIAGNOSIS — J309 Allergic rhinitis, unspecified: Secondary | ICD-10-CM

## 2015-11-18 NOTE — Telephone Encounter (Signed)
Pt requesting loratadine to be filled @ [facility] on [address].

## 2015-11-19 NOTE — Telephone Encounter (Signed)
Called pt and she states it was going to be 75.00 this time, called pharm, pharmacist finally got it to go through by doing 30 day script, insurance co had originally told pt to ask for 90 day scripts, it will be 3.00 now

## 2015-11-20 ENCOUNTER — Ambulatory Visit (INDEPENDENT_AMBULATORY_CARE_PROVIDER_SITE_OTHER): Payer: Medicare Other | Admitting: Internal Medicine

## 2015-11-20 ENCOUNTER — Encounter: Payer: Self-pay | Admitting: Internal Medicine

## 2015-11-20 VITALS — BP 134/62 | HR 96 | Temp 97.9°F | Resp 18 | Ht 68.0 in | Wt 297.4 lb

## 2015-11-20 DIAGNOSIS — Z87891 Personal history of nicotine dependence: Secondary | ICD-10-CM | POA: Diagnosis not present

## 2015-11-20 DIAGNOSIS — R269 Unspecified abnormalities of gait and mobility: Secondary | ICD-10-CM

## 2015-11-20 DIAGNOSIS — I951 Orthostatic hypotension: Secondary | ICD-10-CM | POA: Diagnosis not present

## 2015-11-20 DIAGNOSIS — I10 Essential (primary) hypertension: Secondary | ICD-10-CM

## 2015-11-20 DIAGNOSIS — M25561 Pain in right knee: Secondary | ICD-10-CM

## 2015-11-20 MED ORDER — TOILET SEAT ELEVATOR MISC: 1.0000 [IU] | Status: DC

## 2015-11-20 NOTE — Assessment & Plan Note (Signed)
A: Abnormality of gait due to right knee pain  P: She is at risk of falling and I think she would benefit from a formal PT evaluation and treatment which I have ordered today.  I have also ordered her a rolling walker with seat and an elevated toliet seat.
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
## 2015-11-20 NOTE — Patient Instructions (Signed)
General Instructions:  I have ordered your a Rolling walker with seat and a toliet seat elevator.  I want you to work with physical therapy.  Please bring your medicines with you each time you come to clinic.  Medicines may include prescription medications, over-the-counter medications, herbal remedies, eye drops, vitamins, or other pills.   Progress Toward Treatment Goals:  Treatment Goal 03/20/2015  Blood pressure at goal    Self Care Goals & Plans:  Self Care Goal 10/09/2015  Manage my medications take my medicines as prescribed; bring my medications to every visit; refill my medications on time  Monitor my health -  Eat healthy foods eat more vegetables; eat foods that are low in salt; eat baked foods instead of fried foods  Be physically active find an activity I enjoy    No flowsheet data found.   Care Management & Community Referrals:  Referral 03/20/2015  Referrals made for care management support none needed  Referrals made to community resources -

## 2015-11-20 NOTE — Assessment & Plan Note (Signed)
Orthostatics today Lying 134/62 (96) Sitting 143/65 (94) Standing 155/77  A: Essential Hypertension, with orthostatic hypotension  P: Her orthostatic are negative today however I am still concerned she may be having some orthostatic hypotension.  I reviewed with her recommendations to stand slowly and sit if she is feeling light headed.  I think she would benefit from a walker with a seat and an elevated toliet seat given her gait abnormality and orthostatic hypotenions and I have ordered these. - For now will continue her amlodipine 5mg  daily.  If she continues to have trouble with dizziness we may reduce this or discontinue it completely and allow for more permissive hypertension.

## 2015-11-21 ENCOUNTER — Telehealth: Payer: Self-pay | Admitting: Ophthalmology

## 2015-11-21 NOTE — Telephone Encounter (Signed)
Patient's DME order for her Elevated Shower Chair has been faxed to [facility]. If the patient has any questions in regards to delivery time please have her to call [phone].

## 2015-11-24 ENCOUNTER — Other Ambulatory Visit: Payer: Self-pay | Admitting: Internal Medicine

## 2015-11-24 NOTE — Progress Notes (Signed)
Internal Medicine Clinic Attending  Case discussed with Dr. Hoffman at the time of the visit.  We reviewed the resident's history and exam and pertinent patient test results.  I agree with the assessment, diagnosis, and plan of care documented in the resident's note.  

## 2015-11-24 NOTE — Telephone Encounter (Signed)
Call returned and pt not at home.  Message left to call clinic

## 2015-11-24 NOTE — Telephone Encounter (Signed)
Pt requesting the nurse to call back regarding blood pressure med.

## 2015-11-25 ENCOUNTER — Telehealth: Payer: Self-pay | Admitting: Ophthalmology

## 2015-11-25 NOTE — Telephone Encounter (Signed)
Refaxing patient DME orders for Her Elevated Shower Chair and 4 Wheeled walker and patient has contacted [facility] and they stated they did not receive them both.  Refaxing directly to the DME department @ [phone] and to [phone].  If patient calls again please have her to call [phone].

## 2015-11-25 NOTE — Telephone Encounter (Signed)
Pt returned call spoke to her about bp meds and then transferred to [facility]. For f/u DME

## 2015-12-10 ENCOUNTER — Other Ambulatory Visit: Payer: Self-pay | Admitting: Internal Medicine

## 2015-12-10 NOTE — Telephone Encounter (Signed)
Spoke w/ pt, she wants to see dr Heber [place] about her legs swelling again, sch 3/16 at 1515, she will pick up her med

## 2015-12-10 NOTE — Telephone Encounter (Signed)
Pt has refills at pharm, they are getting it ready

## 2015-12-10 NOTE — Telephone Encounter (Signed)
Patient would like a refill on her Lipitor medications.

## 2015-12-25 ENCOUNTER — Ambulatory Visit (INDEPENDENT_AMBULATORY_CARE_PROVIDER_SITE_OTHER): Payer: Medicare Other | Admitting: Internal Medicine

## 2015-12-25 ENCOUNTER — Encounter: Payer: Self-pay | Admitting: Internal Medicine

## 2015-12-25 VITALS — BP 151/75 | HR 92 | Temp 97.5°F | Wt 302.4 lb

## 2015-12-25 DIAGNOSIS — I10 Essential (primary) hypertension: Secondary | ICD-10-CM | POA: Diagnosis not present

## 2015-12-25 DIAGNOSIS — M1711 Unilateral primary osteoarthritis, right knee: Secondary | ICD-10-CM

## 2015-12-25 MED ORDER — AMLODIPINE BESYLATE 2.5 MG PO TABS: 2.5000 mg | ORAL_TABLET | Freq: Every day | ORAL | Status: DC
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
## 2015-12-25 NOTE — Assessment & Plan Note (Signed)
HPI: She has long standing history of right knee pain with severe tricompartmental degenerative changes on Xray.  She notes that for the past week she has been in 10/10 pain in her knee which makes it difficult for her to stand from sitting.  She has not had any fever or chills.  Ibuprofen helps some.  She did get a knee injection from Dr Micheline Chapman about 10-11 months ago which helped.  A: OA of right knee  P:  Knee aspiration/injection of steroid.

## 2015-12-25 NOTE — Assessment & Plan Note (Signed)
>>  ASSESSMENT AND PLAN FOR OSTEOARTHRITIS OF RIGHT KNEE WRITTEN ON 12/25/2015  5:58 PM BY HOFFMAN, ERIK C, DO  HPI: She has long standing history of right knee pain with severe tricompartmental degenerative changes on Xray.  She notes that for the past week she has been in 10/10 pain in her knee which makes it difficult for her to stand from sitting.  She has not had any fever or chills.  Ibuprofen helps some.  She did get a knee injection from Dr Margaretha Sheffield about 10-11 months ago which helped.  A: OA of right knee  P:  Knee aspiration/injection of steroid.

## 2015-12-25 NOTE — Patient Instructions (Signed)
General Instructions:  I want you to decrease Amlodipine 2.5mg  daily  Please bring your medicines with you each time you come to clinic.  Medicines may include prescription medications, over-the-counter medications, herbal remedies, eye drops, vitamins, or other pills.   Progress Toward Treatment Goals:  Treatment Goal 03/20/2015  Blood pressure at goal    Self Care Goals & Plans:  Self Care Goal 12/25/2015  Manage my medications take my medicines as prescribed; bring my medications to every visit; refill my medications on time  Monitor my health keep track of my blood pressure  Eat healthy foods eat more vegetables; eat foods that are low in salt; eat baked foods instead of fried foods  Be physically active find an activity I enjoy    No flowsheet data found.   Care Management & Community Referrals:  Referral 03/20/2015  Referrals made for care management support none needed  Referrals made to community resources -       Knee Injection A knee injection is a procedure to get medicine into your knee joint. Your health care provider puts a needle into the joint and injects medicine with an attached syringe. The injected medicine may relieve the pain, swelling, and stiffness of arthritis. The injected medicine may also help to lubricate and cushion your knee joint. You may need more than one injection. LET [facility] CARE PROVIDER KNOW ABOUT:  Any allergies you have.  All medicines you are taking, including vitamins, herbs, eye drops, creams, and over-the-counter medicines.  Previous problems you or members of your family have had with the use of anesthetics.  Any blood disorders you have.  Previous surgeries you have had.  Any medical conditions you may have. RISKS AND COMPLICATIONS Generally, this is a safe procedure. However, problems may occur, including:  Infection.  Bleeding.  Worsening symptoms.  Damage to the area around your knee.  Allergic reaction to  any of the medicines.  Skin reactions from repeated injections. BEFORE THE PROCEDURE  Ask your health care provider about changing or stopping your regular medicines. This is especially important if you are taking diabetes medicines or blood thinners.  Plan to have someone take you home after the procedure. PROCEDURE  You will sit or lie down in a position for your knee to be treated.  The skin over your kneecap will be cleaned with a germ-killing solution (antiseptic).  You will be given a medicine that numbs the area (local anesthetic). You may feel some stinging.  After your knee becomes numb, you will have a second injection. This is the medicine. This needle is carefully placed between your kneecap and your knee. The medicine is injected into the joint space.  At the end of the procedure, the needle will be removed.  A bandage (dressing) may be placed over the injection site. The procedure may vary among health care providers and hospitals. AFTER THE PROCEDURE  You may have to move your knee through its full range of motion. This helps to get all of the medicine into your joint space.  Your blood pressure, heart rate, breathing rate, and blood oxygen level will be monitored often until the medicines you were given have worn off.  You will be watched to make sure that you do not have a reaction to the injected medicine.   This information is not intended to replace advice given to you by your health care provider. Make sure you discuss any questions you have with your health care provider.  Document Released: 12/19/2006 Document Revised: 10/18/2014 Document Reviewed: 08/07/2014 Elsevier Interactive Patient Education [employer].

## 2015-12-25 NOTE — Progress Notes (Signed)
PROCEDURE NOTE  PROCEDURE: right knee joint steroid injection.  PREOPERATIVE DIAGNOSIS: Osteoarthritis of the right knee.  POSTOPERATIVE DIAGNOSIS: Osteoarthritis of the right knee.  PROCEDURE: The patient was apprised of the risks and the benefits of the procedure and informed consent was obtained, as witnessed by Dr Dareen Piano. Time-out procedure was performed, with confirmation of the patient's name, date of birth, and correct identification of the right knee to be injected. The patient's knee was then marked at the appropriate site for injection placement. The knee was sterilely prepped with Betadine. A 40 mg (1 milliliter) solution of Kenalog was drawn up into a 5 mL syringe with 4 mL of 1% lidocaine.  A 18-gauge needle was placed into the lateral aspect of her right flexed knee I attempted to aspirate synovial fluid without success, I then injected the steroid/lidocaine solution. There were no complications. The patient tolerated the procedure well. There was minimal bleeding. The patient was instructed to ice her knee upon leaving clinic and refrain from overuse over the next 3 days. The patient was instructed to go to the emergency room with any usual pain, swelling, or redness occurred in the injected area. The patient was given a followup appointment to evaluate response to the injection to his increased range of motion and reduction of pain.  The procedure was supervised by attending physician, Dr. Dareen Piano.

## 2015-12-25 NOTE — Assessment & Plan Note (Signed)
HPI: Patient is still having bilateral lower extremity edmea and occasional episodes of dizziness on standing.  She does not feel that compression stockings work.  A: Essential HTN, at goal  P - Reduce Amlodipine to 2.5mg  to see if this helps with orthostatic hypotension and leg swelling.  Follow up in 1 month.

## 2015-12-26 NOTE — Progress Notes (Signed)
Internal Medicine Clinic Attending  I saw and evaluated the patient.  I personally confirmed the key portions of the history and exam documented by Dr. Hoffman and I reviewed pertinent patient test results.  The assessment, diagnosis, and plan were formulated together and I agree with the documentation in the resident's note.      

## 2016-03-05 ENCOUNTER — Other Ambulatory Visit: Payer: Self-pay

## 2016-03-05 DIAGNOSIS — J309 Allergic rhinitis, unspecified: Secondary | ICD-10-CM

## 2016-03-05 MED ORDER — LORATADINE 10 MG PO TABS: ORAL_TABLET | ORAL | Status: DC

## 2016-03-05 NOTE — Telephone Encounter (Signed)
Pt requesting loratadine to be filled @ [facility] on [address].

## 2016-04-27 ENCOUNTER — Encounter: Payer: Self-pay | Admitting: Internal Medicine

## 2016-04-27 ENCOUNTER — Telehealth: Payer: Self-pay | Admitting: *Deleted

## 2016-04-27 ENCOUNTER — Ambulatory Visit (INDEPENDENT_AMBULATORY_CARE_PROVIDER_SITE_OTHER): Payer: Medicare Other | Admitting: Internal Medicine

## 2016-04-27 VITALS — BP 141/59 | HR 95 | Temp 97.7°F | Wt 307.4 lb

## 2016-04-27 DIAGNOSIS — I10 Essential (primary) hypertension: Secondary | ICD-10-CM | POA: Diagnosis not present

## 2016-04-27 DIAGNOSIS — Z87891 Personal history of nicotine dependence: Secondary | ICD-10-CM

## 2016-04-27 DIAGNOSIS — J309 Allergic rhinitis, unspecified: Secondary | ICD-10-CM | POA: Diagnosis not present

## 2016-04-27 DIAGNOSIS — M25562 Pain in left knee: Secondary | ICD-10-CM

## 2016-04-27 DIAGNOSIS — M25462 Effusion, left knee: Secondary | ICD-10-CM | POA: Insufficient documentation

## 2016-04-27 DIAGNOSIS — E785 Hyperlipidemia, unspecified: Secondary | ICD-10-CM | POA: Diagnosis not present

## 2016-04-27 DIAGNOSIS — M7989 Other specified soft tissue disorders: Secondary | ICD-10-CM

## 2016-04-27 DIAGNOSIS — M1711 Unilateral primary osteoarthritis, right knee: Secondary | ICD-10-CM | POA: Diagnosis not present

## 2016-04-27 DIAGNOSIS — Z79899 Other long term (current) drug therapy: Secondary | ICD-10-CM | POA: Diagnosis not present

## 2016-04-27 MED ORDER — ATORVASTATIN CALCIUM 40 MG PO TABS: 40.0000 mg | ORAL_TABLET | Freq: Every day | ORAL | Status: DC

## 2016-04-27 MED ORDER — CELECOXIB 200 MG PO CAPS: 200.0000 mg | ORAL_CAPSULE | Freq: Every day | ORAL | Status: DC

## 2016-04-27 MED ORDER — TRAMADOL-ACETAMINOPHEN 37.5-325 MG PO TABS: 1.0000 | ORAL_TABLET | Freq: Four times a day (QID) | ORAL | Status: DC | PRN

## 2016-04-27 MED ORDER — LORATADINE 10 MG PO TABS: ORAL_TABLET | ORAL | Status: DC

## 2016-04-27 MED ORDER — AMLODIPINE BESYLATE 2.5 MG PO TABS: 2.5000 mg | ORAL_TABLET | Freq: Every day | ORAL | Status: DC

## 2016-04-27 NOTE — Assessment & Plan Note (Signed)
>>  ASSESSMENT AND PLAN FOR OSTEOARTHRITIS OF RIGHT KNEE WRITTEN ON 04/27/2016  9:29 AM BY Carolynn Comment, MD  Christine [name] reports that her knee pain has significantly worsened over the last several months. She was last seen by Dr. Mikey Bussing in our office and received a steroid injection at that time. She reports at this injection and limited benefit lasting only around 2 weeks. She is severely limited her activities of daily living with this pain. She has been taking Ultracet for it and finding some relief that last maybe 1-2 hours she is using this medication one or 2 times daily. We run out but reports that she is not able to take other over-the-counter medications such as ibuprofen due to gastric discomfort.  She reports that her pain is significant enough that she has difficulty with standing from sitting and sitting from standing she has a lot of difficulty with using the restroom as this requires her to bend her knees. Grable to evaluation from orthopedic surgery at this time for definitive treatment. Recent x-rays show significant tricompartmental disease and will only be treated surgically. I have placed a referral for her today. In addition to this referral I also started her on Celebrex as she has no cardiac history. I feel that this medication may be mildly beneficial. I've also refilled her Ultracet to take as needed.

## 2016-04-27 NOTE — Telephone Encounter (Signed)
Pt called, medicare/ medicaid will no longer pay for her claritin [facility], 100 tabs $5.00 [place] approved, pt will go 7/19 and pick up

## 2016-04-27 NOTE — Assessment & Plan Note (Signed)
Christine [name] reports that her knee pain has significantly worsened over the last several months. She was last seen by Dr. Heber [place] in our office and received a steroid injection at that time. She reports at this injection and limited benefit lasting only around 2 weeks. She is severely limited her activities of daily living with this pain. She has been taking Ultracet for it and finding some relief that last maybe 1-2 hours she is using this medication one or 2 times daily. We run out but reports that she is not able to take other over-the-counter medications such as ibuprofen due to gastric discomfort.  She reports that her pain is significant enough that she has difficulty with standing from sitting and sitting from standing she has a lot of difficulty with using the restroom as this requires her to bend her knees. Grable to evaluation from orthopedic surgery at this time for definitive treatment. Recent x-rays show significant tricompartmental disease and will only be treated surgically. I have placed a referral for her today. In addition to this referral I also started her on Celebrex as she has no cardiac history. I feel that this medication may be mildly beneficial. I've also refilled her Ultracet to take as needed.

## 2016-04-27 NOTE — Patient Instructions (Addendum)
For the pain and swelling of the left knee, we have prescribed a new medication called Celebrex. You will take this medication, 200 mg every day, in order to help control her pain. This medication will also help with the pain and arthritis in her right knee and other joints. I have refilled your tramadol-acetaminophen which she can take as needed for extreme pain. Please stop taking ibuprofen or other NSAID medications as these will interact with the new Celebrex medication.  The only true treatment for the severe arthritis in your right knee is to have the joint replaced surgically. In order to have we have placed a referral for evaluation by the orthopedic surgeons, the doctors who would perform the knee replacement. We feel that it would be in your benefit to have this evaluation as her pain is now severely limiting your activity.  I would like to have an ultrasound picture of the left knee in order to evaluate the specific cause of this pain. We will schedule a follow-up appointment for one week in order to review the results of this ultrasound and to decide further treatment at that point.

## 2016-04-27 NOTE — Progress Notes (Signed)
Internal Medicine Clinic Attending  I saw and evaluated the patient.  I personally confirmed the key portions of the history and exam documented by Dr. Strelow and I reviewed pertinent patient test results.  The assessment, diagnosis, and plan were formulated together and I agree with the documentation in the resident's note. 

## 2016-04-27 NOTE — Progress Notes (Signed)
CC: Left knee pain HPI: [name] is a 80 y.o. female with a h/o of significant degenerative joint disease of the right and left knees, hypertension and hyperlipidemia who presents with 2 week history of worsening left knee pain following recent traumatic event.  Please see Problem-based charting for HPI.  Past Medical History  Diagnosis Date  . Hyperlipemia   . Hypertension   . Seasonal allergies   . Cataract   . Thyroid nodule   . Depression   . Pruritus   . Morbidly obese ([place])   . Impaired glucose tolerance    Current Outpatient Rx  Name  Route  Sig  Dispense  Refill  . amLODipine (NORVASC) 2.5 MG tablet   Oral   Take 1 tablet (2.5 mg total) by mouth daily.   30 tablet   5   . atorvastatin (LIPITOR) 40 MG tablet   Oral   Take 1 tablet (40 mg total) by mouth daily.   30 tablet   3   . celecoxib (CELEBREX) 200 MG capsule   Oral   Take 1 capsule (200 mg total) by mouth daily.   30 capsule   2   . fluocinolone (SYNALAR) 0.01 % external solution   Topical   Apply topically 2 (two) times daily.   60 mL   6   . ketoconazole (NIZORAL) 2 % shampoo   Topical   Apply topically 2 (two) times a week.   120 mL   6   . lactase (LACTAID) 3000 UNIT tablet      Take 1 tablet before milk products          . lidocaine (LIDODERM) 5 %   Transdermal   Place 1 patch onto the skin daily. Remove every 12 hours.   15 patch   0   . loratadine (CLARITIN) 10 MG tablet      take 1 tablet by mouth twice a day if needed for itching   180 tablet   1   . phenazopyridine (PYRIDIUM) 200 MG tablet   Oral   Take 1 tablet (200 mg total) by mouth 3 (three) times daily.   6 tablet   0   . traMADol-acetaminophen (ULTRACET) 37.5-325 MG tablet   Oral   Take 1-2 tablets by mouth every 6 (six) hours as needed.   20 tablet   0    Social Hx: Pt is very active at baseline. Enjoys walking everyday. Recently limited by joint pain.  Review of Systems: ROS in HPI.  Otherwise, patient denies chest pain, dyspnea on exertion, changes in appetite, changes in bowel habits, urinary symptoms, weakness of muscles in the upper and lower shortness, neuropathic pain.  Physical Exam: Filed Vitals:   04/27/16 0815  BP: 141/59  Pulse: 95  Temp: 97.7 F (36.5 C)  TempSrc: Oral  Weight: 307 lb 6.4 oz (139.436 kg)  SpO2: 99%   General appearance: alert, cooperative, appears stated age and mild distress Head: Normocephalic, without obvious abnormality, atraumatic Lungs: clear to auscultation bilaterally Heart: regular rate and rhythm, S1, S2 normal, no murmur, click, rub or gallop Extremities: No edema bilaterally, right knee with significant pain to passive and active ROM, TTP over the medial and lateral femero-tibial articulations; No TTP over the femero-tibial joint but significant tenderness in the postero-lateral aspect of the knee in the popliteal fossa, some amount of soft tissue swelling noted, Significantly limited ROM d/t pain  Assessment & Plan:  See encounters tab for problem  based medical decision making. Patient seen with Dr. Angelia Mould  Signed: Holley Raring, MD 04/27/2016, 9:35 AM  Pager: [phone]

## 2016-04-27 NOTE — Addendum Note (Signed)
Addended by: Lucious Groves on: 04/27/2016 09:49 AM   Modules accepted: Level of Service

## 2016-04-27 NOTE — Assessment & Plan Note (Signed)
Blood pressure well controlled on new 2.5 mg dose of amlodipine. Patient reports decreased floor surgery swelling and orthostasis. We will continue this dose and have sent a refill today.

## 2016-04-27 NOTE — Assessment & Plan Note (Signed)
Stable. Refilled loratadine.

## 2016-04-27 NOTE — Assessment & Plan Note (Addendum)
Stable. Refilled atorvastatin.

## 2016-04-27 NOTE — Assessment & Plan Note (Signed)
Patient reports significant pain on the left posterior lateral aspect of the knee which began 1-2 weeks ago after bumping this on the toilet seat. She reports that since this event she has had worsening pain and more limitation in her mobility. She had been relying heavily on her left knee as the right knee was more severely damaged with arthritis. At this point with both knees causing pain she is tearful today and despondent about her ability to walk and be active.  Physical exam reveals a probable soft tissue swelling in the posterior lateral aspect of the knee that may be consistent with a Baker's cyst or soft tissue hematoma. She denies any pain to the medial lateral anterior aspects of the knee along the knee joint. These findings and her history of recent traumatic injury suggest further evaluation with ultrasound initially. If ultrasound is unhelpful in determining cause of her pain so we will suggest proceeding with MRI to evaluate for ligamentous injury. Old x-ray in 2002 demonstrates significant tricompartmental disease in the left knee as well. However symptomatically she has not been having difficulty with this knee prior to her recent trauma.  We have started Celebrex and refilled Ultracet. We will follow up in 1 week after the results of the ultrasound are available.

## 2016-04-28 NOTE — Addendum Note (Signed)
Addended by: Holley Raring A on: 04/28/2016 01:44 PM   Modules accepted: Orders, SmartSet

## 2016-05-04 ENCOUNTER — Ambulatory Visit (HOSPITAL_COMMUNITY)
Admission: RE | Admit: 2016-05-04 | Discharge: 2016-05-04 | Disposition: A | Discharge status: Home / Self Care | Payer: Medicare Other | Source: Ambulatory Visit | Attending: Internal Medicine | Admitting: Internal Medicine

## 2016-05-04 ENCOUNTER — Encounter: Payer: Self-pay | Admitting: Internal Medicine

## 2016-05-04 ENCOUNTER — Ambulatory Visit (INDEPENDENT_AMBULATORY_CARE_PROVIDER_SITE_OTHER): Payer: Medicare Other | Admitting: Internal Medicine

## 2016-05-04 ENCOUNTER — Ambulatory Visit (HOSPITAL_COMMUNITY)
Admission: RE | Admit: 2016-05-04 | Discharge: 2016-05-04 | Disposition: A | Discharge status: Home / Self Care | Payer: Medicare Other | Source: Ambulatory Visit

## 2016-05-04 VITALS — BP 151/66 | HR 94 | Temp 97.7°F | Wt 306.7 lb

## 2016-05-04 DIAGNOSIS — E041 Nontoxic single thyroid nodule: Secondary | ICD-10-CM

## 2016-05-04 DIAGNOSIS — R002 Palpitations: Secondary | ICD-10-CM

## 2016-05-04 DIAGNOSIS — Z87891 Personal history of nicotine dependence: Secondary | ICD-10-CM

## 2016-05-04 DIAGNOSIS — M7989 Other specified soft tissue disorders: Secondary | ICD-10-CM | POA: Diagnosis not present

## 2016-05-04 DIAGNOSIS — R0602 Shortness of breath: Secondary | ICD-10-CM | POA: Insufficient documentation

## 2016-05-04 DIAGNOSIS — M25462 Effusion, left knee: Secondary | ICD-10-CM

## 2016-05-04 DIAGNOSIS — Z8639 Personal history of other endocrine, nutritional and metabolic disease: Secondary | ICD-10-CM

## 2016-05-04 DIAGNOSIS — M1711 Unilateral primary osteoarthritis, right knee: Secondary | ICD-10-CM

## 2016-05-04 DIAGNOSIS — M25562 Pain in left knee: Secondary | ICD-10-CM

## 2016-05-04 NOTE — Progress Notes (Signed)
*  PRELIMINARY RESULTS* Vascular Ultrasound Left lower extremity venous duplex has been completed.  Preliminary findings: no obvious evidence of DVT or baker's cyst.    Landry Mellow, RDMS, RVT  05/04/2016, 10:39 AM

## 2016-05-04 NOTE — Assessment & Plan Note (Addendum)
Patient reports for each history of episodic shortness of breath but is more prominent when she is at rest. She denies any significant association with activity or exertion. She denies chest tightness or pain, episodes of diaphoresis, dizziness, lightheadedness, syncope. She notes that these episodes are self-limited typically lasting only several seconds. She is concerned that they may be associated with her nerves. She has a remote history of palpitations listed in her problem list from 2009, but there is no workup I can find in the chart currently. She does endorse some symptoms of palpitations that occur with these episodes of shortness of breath, and also notes that she gets a burning almost curd-like sensation and nausea but sometimes accompanies these feelings. She notes that the symptoms occur every day to every other day. We will evaluate with an EKG in the clinic today, TSH, and consider 48 hour cardiac event monitoring.

## 2016-05-04 NOTE — Assessment & Plan Note (Addendum)
Patient reports that her symptoms have improved somewhat since beginning Celebrex. She was not able to schedule the ultrasound during the last week, but we have scheduled her today at 9:30 AM. We will review the results of this study at that time and plan our management accordingly.  UPDATE: Patient's ultrasound was negative for any Baker's cyst or other localized knee effusion. We will plan to continue our current management with Celebrex and Ultracet as needed.

## 2016-05-04 NOTE — Assessment & Plan Note (Signed)
Please see the assessment and plan for her shortness of breath.

## 2016-05-04 NOTE — Progress Notes (Signed)
CC: Follow-up left leg swelling and pain HPI: [name] is a 80 y.o. female with a h/o of degenerative joint disease, hypertension, hyperlipidemia, goiter status post lobectomy who presents for follow-up of her left lower extremity pain and swelling.  Please see Problem-based charting for HPI and the status of patient's chronic medical conditions.  Past Medical History:  Diagnosis Date  . Cataract   . Depression   . Hyperlipemia   . Hypertension   . Impaired glucose tolerance   . Morbidly obese ([place])   . Pruritus   . Seasonal allergies   . Thyroid nodule     Current Outpatient Prescriptions:  .  amLODipine (NORVASC) 2.5 MG tablet, Take 1 tablet (2.5 mg total) by mouth daily., Disp: 30 tablet, Rfl: 5 .  atorvastatin (LIPITOR) 40 MG tablet, Take 1 tablet (40 mg total) by mouth daily., Disp: 30 tablet, Rfl: 3 .  celecoxib (CELEBREX) 200 MG capsule, Take 1 capsule (200 mg total) by mouth daily., Disp: 30 capsule, Rfl: 2 .  fluocinolone (SYNALAR) 0.01 % external solution, Apply topically 2 (two) times daily., Disp: 60 mL, Rfl: 6 .  ketoconazole (NIZORAL) 2 % shampoo, Apply topically 2 (two) times a week., Disp: 120 mL, Rfl: 6 .  lactase (LACTAID) 3000 UNIT tablet, Take 1 tablet before milk products , Disp: , Rfl:  .  lidocaine (LIDODERM) 5 %, Place 1 patch onto the skin daily. Remove every 12 hours., Disp: 15 patch, Rfl: 0 .  loratadine (CLARITIN) 10 MG tablet, take 1 tablet by mouth twice a day if needed for itching, Disp: 180 tablet, Rfl: 1 .  phenazopyridine (PYRIDIUM) 200 MG tablet, Take 1 tablet (200 mg total) by mouth 3 (three) times daily., Disp: 6 tablet, Rfl: 0 .  traMADol-acetaminophen (ULTRACET) 37.5-325 MG tablet, Take 1-2 tablets by mouth every 6 (six) hours as needed., Disp: 20 tablet, Rfl: 0  Review of Systems: ROS in HPI. Otherwise, Patient denies chest pain, dyspnea on exertion, tremors, polyuria, polydipsia, changes to appetite, changes to bowel habits,  changes to her skin, heat or cold intolerance.  Physical Exam: Vitals:   05/04/16 0828  BP: (!) 151/66  Pulse: 94  Temp: 97.7 F (36.5 C)  TempSrc: Oral  Weight: (!) 306 lb 11.2 oz (139.1 kg)   General appearance: alert, cooperative, appears stated age and no distress Head: Normocephalic, without obvious abnormality, atraumatic Lungs: clear to auscultation bilaterally Heart: regular rate and rhythm, S1, S2 normal, no murmur, click, rub or gallop Extremities: extremities normal, atraumatic, no cyanosis or edema  Assessment & Plan:  See encounters tab for problem based medical decision making. Patient seen with Dr. Heber [place]  THYROID NODULE History of goiter status post lobectomy. She's been monitored with TSH testing of q3 years, and has been within normal limits. Today she endorses symptoms of palpitations and shortness of breath, and we will recheck TSH today. She denies other symptoms of hyper or hypothyroidism.  SOB (shortness of breath) Patient reports for each history of episodic shortness of breath but is more prominent when she is at rest. She denies any significant association with activity or exertion. She denies chest tightness or pain, episodes of diaphoresis, dizziness, lightheadedness, syncope. She notes that these episodes are self-limited typically lasting only several seconds. She is concerned that they may be associated with her nerves. She has a remote history of palpitations listed in her problem list from 2009, but there is no workup I can find in the chart currently. She  does endorse some symptoms of palpitations that occur with these episodes of shortness of breath, and also notes that she gets a burning almost curd-like sensation and nausea but sometimes accompanies these feelings. She notes that the symptoms occur every day to every other day. We will evaluate with an EKG in the clinic today, TSH, and consider 48 hour cardiac event monitoring.  PALPITATIONS Please  see the assessment and plan for her shortness of breath.  Pain and swelling of left knee Patient reports that her symptoms have improved somewhat since beginning Celebrex. She was not able to schedule the ultrasound during the last week, but we have scheduled her today at 9:30 AM. We will review the results of this study at that time and plan our management accordingly.   Signed: Holley Raring, MD 05/04/2016, 9:26 AM  Pager: [phone]

## 2016-05-04 NOTE — Assessment & Plan Note (Signed)
History of goiter status post lobectomy. She's been monitored with TSH testing of q3 years, and has been within normal limits. Today she endorses symptoms of palpitations and shortness of breath, and we will recheck TSH today. She denies other symptoms of hyper or hypothyroidism.

## 2016-05-04 NOTE — Progress Notes (Signed)
Internal Medicine Clinic Attending  I saw and evaluated the patient.  I personally confirmed the key portions of the history and exam documented by Dr. Strelow and I reviewed pertinent patient test results.  The assessment, diagnosis, and plan were formulated together and I agree with the documentation in the resident's note. 

## 2016-05-04 NOTE — Patient Instructions (Signed)
We will evaluate the results of her ultrasound today. And will give you a call with results and any further instructions on how to proceed. You take her Celebrex as this is getting some relief. He noticed some symptoms of acid reflux, UA take an over-the-counter medication for reducing her acid. These medications include Zantac, or ranitidine, and omeprazole, Prilosec.  In terms of shortness of breath, we have evaluated you with a blood test and a heart monitoring test today in the clinic. We have also referred you across the street to the cardiologist in order to have a 48 hour cardiac monitor placed. This will allow [place] to measure heart rhythms over the next several days and determine if you have any irregular heart rhythms and 90 contributing to your symptoms or shortness of breath.

## 2016-05-05 LAB — TSH: TSH: 0.631 u[IU]/mL (ref 0.450–4.500)

## 2016-05-11 ENCOUNTER — Ambulatory Visit (INDEPENDENT_AMBULATORY_CARE_PROVIDER_SITE_OTHER): Payer: Medicare Other | Admitting: Internal Medicine

## 2016-05-11 VITALS — BP 133/63 | HR 96 | Temp 97.6°F | Ht 68.4 in | Wt 309.6 lb

## 2016-05-11 DIAGNOSIS — Z23 Encounter for immunization: Secondary | ICD-10-CM

## 2016-05-11 DIAGNOSIS — Z6841 Body Mass Index (BMI) 40.0 and over, adult: Secondary | ICD-10-CM | POA: Diagnosis not present

## 2016-05-11 DIAGNOSIS — I10 Essential (primary) hypertension: Secondary | ICD-10-CM

## 2016-05-11 DIAGNOSIS — Z79899 Other long term (current) drug therapy: Secondary | ICD-10-CM | POA: Diagnosis not present

## 2016-05-11 DIAGNOSIS — M25562 Pain in left knee: Secondary | ICD-10-CM

## 2016-05-11 DIAGNOSIS — M25462 Effusion, left knee: Secondary | ICD-10-CM

## 2016-05-11 DIAGNOSIS — R7303 Prediabetes: Secondary | ICD-10-CM | POA: Diagnosis not present

## 2016-05-11 DIAGNOSIS — E785 Hyperlipidemia, unspecified: Secondary | ICD-10-CM | POA: Diagnosis not present

## 2016-05-11 DIAGNOSIS — M7989 Other specified soft tissue disorders: Secondary | ICD-10-CM

## 2016-05-11 DIAGNOSIS — Z87891 Personal history of nicotine dependence: Secondary | ICD-10-CM | POA: Diagnosis not present

## 2016-05-11 DIAGNOSIS — E041 Nontoxic single thyroid nodule: Secondary | ICD-10-CM | POA: Diagnosis not present

## 2016-05-11 LAB — POCT GLYCOSYLATED HEMOGLOBIN (HGB A1C): HEMOGLOBIN A1C: 6.1

## 2016-05-11 LAB — GLUCOSE, CAPILLARY: GLUCOSE-CAPILLARY: 114 mg/dL — AB (ref 65–99)

## 2016-05-11 NOTE — Assessment & Plan Note (Signed)
-   Repeat A1C today is 6.1 - Patient educated about diet and weight loss and its importance in her health as well as her borderline DM - She expresses understanding - Will monitor

## 2016-05-11 NOTE — Assessment & Plan Note (Signed)
-   Patient was started on celebrex for left knee OA and is also using ultracet prn for pain - She states her pain has improved and her swelling has decreased. - ultrasound of left LE was negative for DVT/Baker's cyst on last visit  - will c/w current therapy for now - Would consider left knee steroid injection if pain worsens. Had her right knee injected by Dr. Micheline Chapman and would like to follow up with him if pain worsens

## 2016-05-11 NOTE — Assessment & Plan Note (Signed)
-   Patient given advice on weight loss and diet - Explained importance of losing weight to overall health and patient is in agreement to try and lose weight. - Will follow up in 3 months

## 2016-05-11 NOTE — Assessment & Plan Note (Addendum)
BP Readings from Last 3 Encounters:  05/11/16 133/63  05/04/16 (!) 151/66  04/27/16 (!) 141/59    Lab Results  Component Value Date   NA 136 09/19/2015   K 4.1 09/19/2015   CREATININE 0.87 09/19/2015    Assessment: Blood pressure control:  well controlled Progress toward BP goal:   at goal Comments: compliant with amlodipine 2.5 mg  Plan: Medications:  continue current medications Educational resources provided:   Self management tools provided:   Other plans: Patient with mild b/l LE swelling possibly secondary to amlodipine. If persists would consider switching to HCTZ

## 2016-05-11 NOTE — Patient Instructions (Signed)
- It was a pleasure seeing you today - We will check some bloodwork today - We will also give you a tetanus booster - Your BP is well controlled - Please follow up in 2 months - If your swelling in your legs gets worse or if the pain in your left knee is bad please come in sooner - Please try and watch your diet. Losing weight will help decrease the pain in your knees  Calorie Counting for Weight Loss Calories are energy you get from the things you eat and drink. Your body uses this energy to keep you going throughout the day. The number of calories you eat affects your weight. When you eat more calories than your body needs, your body stores the extra calories as fat. When you eat fewer calories than your body needs, your body burns fat to get the energy it needs. Calorie counting means keeping track of how many calories you eat and drink each day. If you make sure to eat fewer calories than your body needs, you should lose weight. In order for calorie counting to work, you will need to eat the number of calories that are right for you in a day to lose a healthy amount of weight per week. A healthy amount of weight to lose per week is usually 1-2 lb (0.5-0.9 kg). A dietitian can determine how many calories you need in a day and give you suggestions on how to reach your calorie goal.  WHAT IS MY MY PLAN? My goal is to have __________ calories per day.  If I have this many calories per day, I should lose around __________ pounds per week. WHAT DO I NEED TO KNOW ABOUT CALORIE COUNTING? In order to meet your daily calorie goal, you will need to:  Find out how many calories are in each food you would like to eat. Try to do this before you eat.  Decide how much of the food you can eat.  Write down what you ate and how many calories it had. Doing this is called keeping a food log. WHERE DO I FIND CALORIE INFORMATION? The number of calories in a food can be found on a Nutrition Facts label. Note that  all the information on a label is based on a specific serving of the food. If a food does not have a Nutrition Facts label, try to look up the calories online or ask your dietitian for help. HOW DO I DECIDE HOW MUCH TO EAT? To decide how much of the food you can eat, you will need to consider both the number of calories in one serving and the size of one serving. This information can be found on the Nutrition Facts label. If a food does not have a Nutrition Facts label, look up the information online or ask your dietitian for help. Remember that calories are listed per serving. If you choose to have more than one serving of a food, you will have to multiply the calories per serving by the amount of servings you plan to eat. For example, the label on a package of bread might say that a serving size is 1 slice and that there are 90 calories in a serving. If you eat 1 slice, you will have eaten 90 calories. If you eat 2 slices, you will have eaten 180 calories. HOW DO I KEEP A FOOD LOG? After each meal, record the following information in your food log:  What you ate.  How much  of it you ate.  How many calories it had.  Then, add up your calories. Keep your food log near you, such as in a small notebook in your pocket. Another option is to use a mobile app or website. Some programs will calculate calories for you and show you how many calories you have left each time you add an item to the log. WHAT ARE SOME CALORIE COUNTING TIPS?  Use your calories on foods and drinks that will fill you up and not leave you hungry. Some examples of this include foods like nuts and nut butters, vegetables, lean proteins, and high-fiber foods (more than 5 g fiber per serving).  Eat nutritious foods and avoid empty calories. Empty calories are calories you get from foods or beverages that do not have many nutrients, such as candy and soda. It is better to have a nutritious high-calorie food (such as an avocado) than a  food with few nutrients (such as a bag of chips).  Know how many calories are in the foods you eat most often. This way, you do not have to look up how many calories they have each time you eat them.  Look out for foods that may seem like low-calorie foods but are really high-calorie foods, such as baked goods, soda, and fat-free candy.  Pay attention to calories in drinks. Drinks such as sodas, specialty coffee drinks, alcohol, and juices have a lot of calories yet do not fill you up. Choose low-calorie drinks like water and diet drinks.  Focus your calorie counting efforts on higher calorie items. Logging the calories in a garden salad that contains only vegetables is less important than calculating the calories in a milk shake.  Find a way of tracking calories that works for you. Get creative. Most people who are successful find ways to keep track of how much they eat in a day, even if they do not count every calorie. WHAT ARE SOME PORTION CONTROL TIPS?  Know how many calories are in a serving. This will help you know how many servings of a certain food you can have.  Use a measuring cup to measure serving sizes. This is helpful when you start out. With time, you will be able to estimate serving sizes for some foods.  Take some time to put servings of different foods on your favorite plates, bowls, and cups so you know what a serving looks like.  Try not to eat straight from a bag or box. Doing this can lead to overeating. Put the amount you would like to eat in a cup or on a plate to make sure you are eating the right portion.  Use smaller plates, glasses, and bowls to prevent overeating. This is a quick and easy way to practice portion control. If your plate is smaller, less food can fit on it.  Try not to multitask while eating, such as watching TV or using your computer. If it is time to eat, sit down at a table and enjoy your food. Doing this will help you to start recognizing when you  are full. It will also make you more aware of what and how much you are eating. HOW CAN I CALORIE COUNT WHEN EATING OUT?  Ask for smaller portion sizes or child-sized portions.  Consider sharing an entree and sides instead of getting your own entree.  If you get your own entree, eat only half. Ask for a box at the beginning of your meal and put the  rest of your entree in it so you are not tempted to eat it.  Look for the calories on the menu. If calories are listed, choose the lower calorie options.  Choose dishes that include vegetables, fruits, whole grains, low-fat dairy products, and lean protein. Focusing on smart food choices from each of the 5 food groups can help you stay on track at restaurants.  Choose items that are boiled, broiled, grilled, or steamed.  Choose water, milk, unsweetened iced tea, or other drinks without added sugars. If you want an alcoholic beverage, choose a lower calorie option. For example, a regular margarita can have up to 700 calories and a glass of wine has around 150.  Stay away from items that are buttered, battered, fried, or served with cream sauce. Items labeled "crispy" are usually fried, unless stated otherwise.  Ask for dressings, sauces, and syrups on the side. These are usually very high in calories, so do not eat much of them.  Watch out for salads. Many people think salads are a healthy option, but this is often not the case. Many salads come with bacon, fried chicken, lots of cheese, fried chips, and dressing. All of these items have a lot of calories. If you want a salad, choose a garden salad and ask for grilled meats or steak. Ask for the dressing on the side, or ask for olive oil and vinegar or lemon to use as dressing.  Estimate how many servings of a food you are given. For example, a serving of cooked rice is  cup or about the size of half a tennis ball or one cupcake wrapper. Knowing serving sizes will help you be aware of how much food  you are eating at restaurants. The list below tells you how big or small some common portion sizes are based on everyday objects.  1 oz--4 stacked dice.  3 oz--1 deck of cards.  1 tsp--1 dice.  1 Tbsp-- a Ping-Pong ball.  2 Tbsp--1 Ping-Pong ball.   cup--1 tennis ball or 1 cupcake wrapper.  1 cup--1 baseball.   This information is not intended to replace advice given to you by your health care provider. Make sure you discuss any questions you have with your health care provider.   Document Released: 09/27/2005 Document Revised: 10/18/2014 Document Reviewed: 08/02/2013 Elsevier Interactive Patient Education [employer].

## 2016-05-11 NOTE — Progress Notes (Signed)
   Subjective:    Patient ID: Christine [name], female    DOB: 06-24-1936, 80 y.o.   MRN: [id number]  HPI  Patient seen and examined. She is here for routine follow up of her left knee pain and HTN. She states that her knee pain has improved since starting the celebrex but that it is not back to baseline yet. Left knee swelling has improved. She is able to ambulate with her walker.  She is compliant with her medications and denies any other new complaints.   Review of Systems  Constitutional: Negative.   HENT: Negative.   Cardiovascular: Positive for leg swelling. Negative for chest pain and palpitations.  Gastrointestinal: Negative.   Musculoskeletal: Positive for arthralgias. Negative for back pain, joint swelling and myalgias.  Skin: Negative.   Neurological: Negative.   Psychiatric/Behavioral: Negative.        Objective:   Physical Exam  Constitutional: She is oriented to person, place, and time. She appears well-developed and well-nourished.  HENT:  Head: Normocephalic and atraumatic.  Eyes: Right eye exhibits no discharge. Left eye exhibits no discharge.  Cardiovascular: Normal rate, regular rhythm and normal heart sounds.   Pulmonary/Chest: Effort normal and breath sounds normal. No respiratory distress. She has no wheezes.  Abdominal: Soft. Bowel sounds are normal. She exhibits no distension. There is no tenderness.  Musculoskeletal: Normal range of motion. She exhibits edema. She exhibits no tenderness.  Trace to 1 + b/l LE edema  Neurological: She is alert and oriented to person, place, and time.  Skin: Skin is warm. No rash noted. No erythema.  Psychiatric: She has a normal mood and affect. Her behavior is normal.          Assessment & Plan:  Please see problem based charting for assessment and plan:

## 2016-05-11 NOTE — Assessment & Plan Note (Signed)
-   Patient had TSH checked on last visit which was normal.  - She has no signs of hyper or hypothyroidism. Will continue to monitor

## 2016-05-11 NOTE — Assessment & Plan Note (Signed)
-   rechecked lipid profile today - Will c/w atorvastatin for now

## 2016-05-12 ENCOUNTER — Other Ambulatory Visit: Payer: Self-pay | Admitting: Internal Medicine

## 2016-05-12 ENCOUNTER — Telehealth: Payer: Self-pay | Admitting: Internal Medicine

## 2016-05-12 LAB — BMP8+ANION GAP
Anion Gap: 17 mmol/L (ref 10.0–18.0)
BUN / CREAT RATIO: 20 (ref 12–28)
BUN: 16 mg/dL (ref 8–27)
CO2: 26 mmol/L (ref 18–29)
CREATININE: 0.79 mg/dL (ref 0.57–1.00)
Calcium: 9.5 mg/dL (ref 8.7–10.3)
Chloride: 96 mmol/L (ref 96–106)
GFR, EST AFRICAN AMERICAN: 83 mL/min/{1.73_m2} (ref >59)
GFR, EST NON AFRICAN AMERICAN: 72 mL/min/{1.73_m2} (ref >59)
Glucose: 119 mg/dL — ABNORMAL HIGH (ref 65–99)
POTASSIUM: 4.2 mmol/L (ref 3.5–5.2)
SODIUM: 139 mmol/L (ref 134–144)

## 2016-05-12 LAB — LIPID PANEL
CHOL/HDL RATIO: 3.2 ratio (ref 0.0–4.4)
CHOLESTEROL TOTAL: 190 mg/dL (ref 100–199)
HDL: 59 mg/dL (ref >39)
LDL CALC: 102 mg/dL — AB (ref 0–99)
TRIGLYCERIDES: 143 mg/dL (ref 0–149)
VLDL Cholesterol Cal: 29 mg/dL (ref 5–40)

## 2016-05-12 MED ORDER — FLUOCINOLONE ACETONIDE 0.01 % EX SOLN: Freq: Two times a day (BID) | CUTANEOUS | 3 refills | Status: DC

## 2016-05-12 NOTE — Telephone Encounter (Signed)
Results of BMP and lipid profile discussed with patient. Lab results are stable and no change of medication required at this time. Patient expresses understanding.  She also states she uses fluocinolone on her scalp and requires a refill. Will refill for now

## 2016-05-24 ENCOUNTER — Ambulatory Visit (INDEPENDENT_AMBULATORY_CARE_PROVIDER_SITE_OTHER): Payer: Medicare Other

## 2016-05-24 DIAGNOSIS — R002 Palpitations: Secondary | ICD-10-CM | POA: Diagnosis not present

## 2016-05-24 DIAGNOSIS — R0602 Shortness of breath: Secondary | ICD-10-CM

## 2016-06-10 ENCOUNTER — Telehealth: Payer: Self-pay | Admitting: Internal Medicine

## 2016-06-10 DIAGNOSIS — J309 Allergic rhinitis, unspecified: Secondary | ICD-10-CM

## 2016-06-10 MED ORDER — LORATADINE 10 MG PO TABS: ORAL_TABLET | ORAL | 1 refills | Status: DC

## 2016-06-10 NOTE — Telephone Encounter (Signed)
Attempted to contact pt for further details-no answer, message left on pt's recorder.Christine [name], Ceola Para Cassady8/31/201710:27 AM    Call made to pt's pharmay to see if rx needed a PA.  She informed me that insurance will only pay for once daily dosing (not BID) since medication is avail otc. Pharmacist ran a test claim for once daily and insurance accepted.  Will forward info to pcp to change rx to once a day dosing, if appropriate.  Please advise  [name]8/31/201710:36 AM

## 2016-06-10 NOTE — Telephone Encounter (Signed)
loratadine (CLARITIN) 10 MG tablet  Pt needs refill but states her insurance will not cover

## 2016-07-05 ENCOUNTER — Telehealth: Payer: Self-pay | Admitting: Internal Medicine

## 2016-07-05 NOTE — Telephone Encounter (Signed)
Would like the nurse to call

## 2016-07-05 NOTE — Telephone Encounter (Signed)
Have tried to call again, lm for rtc

## 2016-07-05 NOTE — Telephone Encounter (Signed)
Tried to call, lm for rtc 

## 2016-07-06 NOTE — Telephone Encounter (Signed)
If her pain is worsening would have her follow up in [facility] for follow up. She may need an injection in her left knee

## 2016-07-06 NOTE — Telephone Encounter (Signed)
Call from pt - states the pain in her legs are so bad sometimes; wants to know if she can take 2 Celebrex instead of 1 daily ? Thanks

## 2016-07-06 NOTE — Telephone Encounter (Signed)
Called pt - stated she can come tomorrow; appt scheduled [facility] @ 0845 AM.

## 2016-07-07 ENCOUNTER — Ambulatory Visit (INDEPENDENT_AMBULATORY_CARE_PROVIDER_SITE_OTHER): Payer: Medicare Other | Admitting: Internal Medicine

## 2016-07-07 VITALS — BP 141/62 | HR 88 | Temp 97.9°F | Ht 68.4 in | Wt 311.5 lb

## 2016-07-07 DIAGNOSIS — R600 Localized edema: Secondary | ICD-10-CM | POA: Diagnosis not present

## 2016-07-07 DIAGNOSIS — Z87891 Personal history of nicotine dependence: Secondary | ICD-10-CM

## 2016-07-07 DIAGNOSIS — Z6841 Body Mass Index (BMI) 40.0 and over, adult: Secondary | ICD-10-CM | POA: Diagnosis not present

## 2016-07-07 DIAGNOSIS — M1711 Unilateral primary osteoarthritis, right knee: Secondary | ICD-10-CM | POA: Diagnosis not present

## 2016-07-07 DIAGNOSIS — I10 Essential (primary) hypertension: Secondary | ICD-10-CM

## 2016-07-07 DIAGNOSIS — R2243 Localized swelling, mass and lump, lower limb, bilateral: Secondary | ICD-10-CM | POA: Insufficient documentation

## 2016-07-07 DIAGNOSIS — E669 Obesity, unspecified: Secondary | ICD-10-CM

## 2016-07-07 MED ORDER — ACETAMINOPHEN ER 650 MG PO TBCR: 650.0000 mg | EXTENDED_RELEASE_TABLET | Freq: Three times a day (TID) | ORAL | 3 refills | Status: DC | PRN

## 2016-07-07 MED ORDER — CELECOXIB 200 MG PO CAPS: 200.0000 mg | ORAL_CAPSULE | Freq: Every day | ORAL | 2 refills | Status: DC

## 2016-07-07 MED ORDER — HYDROCHLOROTHIAZIDE 25 MG PO TABS: 25.0000 mg | ORAL_TABLET | Freq: Every day | ORAL | 3 refills | Status: DC

## 2016-07-07 MED ORDER — FLUOCINOLONE ACETONIDE 0.01 % EX SOLN: Freq: Two times a day (BID) | CUTANEOUS | 3 refills | Status: DC

## 2016-07-07 MED ORDER — DICLOFENAC SODIUM 1 % TD GEL: 4.0000 g | Freq: Four times a day (QID) | TRANSDERMAL | 3 refills | Status: DC

## 2016-07-07 NOTE — Assessment & Plan Note (Signed)
She  came to the clinic with complaint of bilateral leg swelling more on right leg. She has a chronic history of leg swelling , swelling get worse at evening time, and resolved in the morning when she gets out of bed.   On exam, Has bilateral lower extremity edema 2+ and right leg and 1+ in left up to mid leg on both sides. There was no calf tenderness, difference between both legs circumference was about 1 cm.  It looks like swelling due to venous insufficiency, because of her history of worse in the evening ,after ambulation and resolved the morning. Amlodipine might be contributory.  We stopped her amlodipine and started her on hydrochlorothiazide for blood pressure. She can use compression stockings when on feet. Try to elevate feet when sitting and resting.

## 2016-07-07 NOTE — Assessment & Plan Note (Signed)
BP Readings from Last 3 Encounters:  07/07/16 (!) 141/62  05/11/16 133/63  05/04/16 (!) 151/66   Her blood pressure was slightly elevated today, she was on amlodipine 2.5 mg which was decreased because of her complaint of leg swelling.  As amlodipine can be contributory to her complaints of bilateral leg swelling, we discontinue amlodipine. Start hydrochlorothiazide 25 mg and advised her to take in the morning. Reassess her during next follow-up visit.

## 2016-07-07 NOTE — Assessment & Plan Note (Signed)
She was interested to get some dietitian help to lose weight.  We'll refer her to our dietitian to come up with the meal plan which can help her to achieve her goals.

## 2016-07-07 NOTE — Progress Notes (Signed)
   CC: Bilateral leg swelling more on the right.  HPI:  Christine [name] is a 80 y.o. with past medical history as listed below, came to the clinic with complaint of bilateral leg swelling more on right leg. She has a chronic history of leg swelling , swelling get worse at evening time, and resolved in the morning when she gets out of bed. She also complained of leg pain mostly around her knees. She is taking Celebrex 200 mg in the morning for her arthritis pain and states that her day is fine but she gets a disturb sleep at night because of the pain. She is obese and interested in some dietary plans to lose some weight.  Past Medical History:  Diagnosis Date  . Cataract   . Depression   . Hyperlipemia   . Hypertension   . Impaired glucose tolerance   . Morbidly obese ([place])   . Pruritus   . Seasonal allergies   . Thyroid nodule     Review of Systems:  As per HPI.  Physical Exam:  Vitals:   07/07/16 0844  BP: (!) 141/62  Pulse: 88  Temp: 97.9 F (36.6 C)  TempSrc: Oral  SpO2: 100%  Weight: (!) 311 lb 8 oz (141.3 kg)  Height: 5' 8.4" (1.737 m)   Gen. well-built, well-nourished, obese lady, in no acute distress. Extremities. Has bilateral lower extremity edema 2+ and right leg and 1+ in left up to mid leg on both sides. There was no calf tenderness, difference between both legs circumference was about 1 cm. Pulses 2+ bilaterally. Musculoskeletal. No erythema, no edema, ROM non restricted. Lungs. Clear bilaterally CV. Regular rate and rhythm Abdomen. Soft, nontender, obese, bowel sounds positive.  Assessment & Plan:   See Encounters Tab for problem based charting.  Patient seen with Dr. Angelia Mould.

## 2016-07-07 NOTE — Patient Instructions (Addendum)
Thank you for requesting clinic today. It looks like you're having some venous insufficiency that is causing swelling of your legs, your blood pressure medicine called amlodipine might be contributing. I'm changing your blood pressure medicine to hydrochlorothiazide 25 mg daily. Please take it in the morning so you do not have to get up at night. Try keeping your feet elevated whenever he is sitting or resting. Use compression stockings whenever on your feet. I'm giving you voltaren gel to rub on your knees. Take your Celebrex 200 mg at night, during the day you can use Tylenol arthritis 650 mg up to 3 times a day. Please follow-up in 3-4 weeks for your blood pressure checkup, or if your symptoms get worse. I'm also referring you of her nutritionist who can help you to lose some weight.

## 2016-07-07 NOTE — Assessment & Plan Note (Signed)
>>  ASSESSMENT AND PLAN FOR OSTEOARTHRITIS OF RIGHT KNEE WRITTEN ON 07/07/2016  1:27 PM BY Arnetha Courser, MD  She do complain of knee pain especially during night she states that she takes Celebrex 200 mg in the morning that helps her during the day for effect weans off at night.  I advised her to take Celebrex 200 mg at night. Voltaren Gel. She can try Tylenol 650 mg up to 3 times a day if needed during the day.

## 2016-07-07 NOTE — Assessment & Plan Note (Signed)
She do complain of knee pain especially during night she states that she takes Celebrex 200 mg in the morning that helps her during the day for effect weans off at night.  I advised her to take Celebrex 200 mg at night. Voltaren Gel. She can try Tylenol 650 mg up to 3 times a day if needed during the day.

## 2016-07-08 NOTE — Progress Notes (Signed)
Internal Medicine Clinic Attending  I saw and evaluated the patient.  I personally confirmed the key portions of the history and exam documented by Dr. Amin and I reviewed pertinent patient test results.  The assessment, diagnosis, and plan were formulated together and I agree with the documentation in the resident's note. 

## 2016-07-13 ENCOUNTER — Ambulatory Visit: Payer: Medicare Other | Admitting: Dietician

## 2016-07-13 ENCOUNTER — Ambulatory Visit (INDEPENDENT_AMBULATORY_CARE_PROVIDER_SITE_OTHER): Payer: Medicare Other | Admitting: Internal Medicine

## 2016-07-13 VITALS — BP 113/89 | HR 94 | Temp 97.6°F | Ht 68.4 in | Wt 308.9 lb

## 2016-07-13 DIAGNOSIS — Z87891 Personal history of nicotine dependence: Secondary | ICD-10-CM

## 2016-07-13 DIAGNOSIS — I10 Essential (primary) hypertension: Secondary | ICD-10-CM | POA: Diagnosis not present

## 2016-07-13 DIAGNOSIS — M1711 Unilateral primary osteoarthritis, right knee: Secondary | ICD-10-CM

## 2016-07-13 DIAGNOSIS — M25561 Pain in right knee: Secondary | ICD-10-CM

## 2016-07-13 DIAGNOSIS — Z0000 Encounter for general adult medical examination without abnormal findings: Secondary | ICD-10-CM

## 2016-07-13 DIAGNOSIS — R7303 Prediabetes: Secondary | ICD-10-CM

## 2016-07-13 DIAGNOSIS — R2243 Localized swelling, mass and lump, lower limb, bilateral: Secondary | ICD-10-CM

## 2016-07-13 LAB — POCT GLYCOSYLATED HEMOGLOBIN (HGB A1C): HEMOGLOBIN A1C: 6.2

## 2016-07-13 LAB — GLUCOSE, CAPILLARY: Glucose-Capillary: 127 mg/dL — ABNORMAL HIGH (ref 65–99)

## 2016-07-13 NOTE — Assessment & Plan Note (Signed)
-   She is allergic to the flu vaccine  - She is also due for her PCV 13 but would like some time to think about this - We will discuss it with her on her next visit - She had her last DEXA in 2014 which was normal. I do not think she needs a DEXA scan every 2 years. Would hold off for now

## 2016-07-13 NOTE — Progress Notes (Signed)
   Subjective:    Patient ID: Christine [name], female    DOB: 1935/10/19, 80 y.o.   MRN: [id number]  HPI  Patient seen and examined. She is here for routine follow up of her HTN and OA.  She denies any new complaints. She was seen in [facility] last week for lower extremity swelling and had her amlodipine stopped and was started on HCTZ 25 mg. Since then she states her swelling has resolved and she feels well. She also was started on volatren gel for knee pain and states it has helped her pain in her knees. She has noted worsening right knee pain over the alst couple of months but states this is controlled with voltaren gel.   Review of Systems  Constitutional: Negative.   HENT: Negative.   Respiratory: Negative.   Cardiovascular: Negative.   Gastrointestinal: Negative.   Musculoskeletal: Positive for arthralgias. Negative for joint swelling.  Neurological: Negative.   Psychiatric/Behavioral: Negative.        Objective:   Physical Exam  Constitutional: She is oriented to person, place, and time. She appears well-developed and well-nourished.  HENT:  Head: Normocephalic and atraumatic.  Mouth/Throat: No oropharyngeal exudate.  Neck: Normal range of motion. Neck supple.  Cardiovascular: Normal rate, regular rhythm and normal heart sounds.   Pulmonary/Chest: Effort normal and breath sounds normal. She has no wheezes.  Abdominal: Soft. Bowel sounds are normal. She exhibits no distension. There is no tenderness.  Musculoskeletal: Normal range of motion. She exhibits no edema.  Lymphadenopathy:    She has no cervical adenopathy.  Neurological: She is alert and oriented to person, place, and time.  Skin: Skin is warm. No rash noted. No erythema.  Psychiatric: She has a normal mood and affect. Her behavior is normal.          Assessment & Plan:   Please see problem based charting for assessment and plan:

## 2016-07-13 NOTE — Assessment & Plan Note (Signed)
BP Readings from Last 3 Encounters:  07/13/16 113/89  07/07/16 (!) 141/62  05/11/16 133/63    Lab Results  Component Value Date   NA 139 05/11/2016   K 4.2 05/11/2016   CREATININE 0.79 05/11/2016    Assessment: Blood pressure control:  well controlled Progress toward BP goal:   at goal Comments: compliant with HCTZ 25 mg  Plan: Medications:  continue current medications Educational resources provided:   Self management tools provided:   Other plans: Will check BMP

## 2016-07-13 NOTE — Assessment & Plan Note (Signed)
-   Patient has been complaining of worsening R knee pain over the last couple of months - This is likely secondary to OA - She has been prescribed celebrex and on her last visit she was also started on voltaren gel - States her pain is well controlled on the voltaren gel - Will continue with current treatment for now - She did have a steroid injection in this knee with Dr. Micheline Chapman and if her pain continues to worsen would refer her back there - We also discussed the importance of weight loss

## 2016-07-13 NOTE — Assessment & Plan Note (Signed)
-   Will check A1C today - Educated patient about the importance of diet and weight loss

## 2016-07-13 NOTE — Patient Instructions (Signed)
- It was a pleasure seeing you today - Your BP is well controlled. Keep up the good work.  - Please continue with the medication for your knee pain - Please follow your diet and exercise plan. We will readdress this on follow up    Calorie Counting for Weight Loss Calories are energy you get from the things you eat and drink. Your body uses this energy to keep you going throughout the day. The number of calories you eat affects your weight. When you eat more calories than your body needs, your body stores the extra calories as fat. When you eat fewer calories than your body needs, your body burns fat to get the energy it needs. Calorie counting means keeping track of how many calories you eat and drink each day. If you make sure to eat fewer calories than your body needs, you should lose weight. In order for calorie counting to work, you will need to eat the number of calories that are right for you in a day to lose a healthy amount of weight per week. A healthy amount of weight to lose per week is usually 1-2 lb (0.5-0.9 kg). A dietitian can determine how many calories you need in a day and give you suggestions on how to reach your calorie goal.  WHAT IS MY MY PLAN? My goal is to have __________ calories per day.  If I have this many calories per day, I should lose around __________ pounds per week. WHAT DO I NEED TO KNOW ABOUT CALORIE COUNTING? In order to meet your daily calorie goal, you will need to:  Find out how many calories are in each food you would like to eat. Try to do this before you eat.  Decide how much of the food you can eat.  Write down what you ate and how many calories it had. Doing this is called keeping a food log. WHERE DO I FIND CALORIE INFORMATION? The number of calories in a food can be found on a Nutrition Facts label. Note that all the information on a label is based on a specific serving of the food. If a food does not have a Nutrition Facts label, try to look up  the calories online or ask your dietitian for help. HOW DO I DECIDE HOW MUCH TO EAT? To decide how much of the food you can eat, you will need to consider both the number of calories in one serving and the size of one serving. This information can be found on the Nutrition Facts label. If a food does not have a Nutrition Facts label, look up the information online or ask your dietitian for help. Remember that calories are listed per serving. If you choose to have more than one serving of a food, you will have to multiply the calories per serving by the amount of servings you plan to eat. For example, the label on a package of bread might say that a serving size is 1 slice and that there are 90 calories in a serving. If you eat 1 slice, you will have eaten 90 calories. If you eat 2 slices, you will have eaten 180 calories. HOW DO I KEEP A FOOD LOG? After each meal, record the following information in your food log:  What you ate.  How much of it you ate.  How many calories it had.  Then, add up your calories. Keep your food log near you, such as in a small notebook in your  pocket. Another option is to use a mobile app or website. Some programs will calculate calories for you and show you how many calories you have left each time you add an item to the log. WHAT ARE SOME CALORIE COUNTING TIPS?  Use your calories on foods and drinks that will fill you up and not leave you hungry. Some examples of this include foods like nuts and nut butters, vegetables, lean proteins, and high-fiber foods (more than 5 g fiber per serving).  Eat nutritious foods and avoid empty calories. Empty calories are calories you get from foods or beverages that do not have many nutrients, such as candy and soda. It is better to have a nutritious high-calorie food (such as an avocado) than a food with few nutrients (such as a bag of chips).  Know how many calories are in the foods you eat most often. This way, you do not have  to look up how many calories they have each time you eat them.  Look out for foods that may seem like low-calorie foods but are really high-calorie foods, such as baked goods, soda, and fat-free candy.  Pay attention to calories in drinks. Drinks such as sodas, specialty coffee drinks, alcohol, and juices have a lot of calories yet do not fill you up. Choose low-calorie drinks like water and diet drinks.  Focus your calorie counting efforts on higher calorie items. Logging the calories in a garden salad that contains only vegetables is less important than calculating the calories in a milk shake.  Find a way of tracking calories that works for you. Get creative. Most people who are successful find ways to keep track of how much they eat in a day, even if they do not count every calorie. WHAT ARE SOME PORTION CONTROL TIPS?  Know how many calories are in a serving. This will help you know how many servings of a certain food you can have.  Use a measuring cup to measure serving sizes. This is helpful when you start out. With time, you will be able to estimate serving sizes for some foods.  Take some time to put servings of different foods on your favorite plates, bowls, and cups so you know what a serving looks like.  Try not to eat straight from a bag or box. Doing this can lead to overeating. Put the amount you would like to eat in a cup or on a plate to make sure you are eating the right portion.  Use smaller plates, glasses, and bowls to prevent overeating. This is a quick and easy way to practice portion control. If your plate is smaller, less food can fit on it.  Try not to multitask while eating, such as watching TV or using your computer. If it is time to eat, sit down at a table and enjoy your food. Doing this will help you to start recognizing when you are full. It will also make you more aware of what and how much you are eating. HOW CAN I CALORIE COUNT WHEN EATING OUT?  Ask for  smaller portion sizes or child-sized portions.  Consider sharing an entree and sides instead of getting your own entree.  If you get your own entree, eat only half. Ask for a box at the beginning of your meal and put the rest of your entree in it so you are not tempted to eat it.  Look for the calories on the menu. If calories are listed, choose the lower calorie  options.  Choose dishes that include vegetables, fruits, whole grains, low-fat dairy products, and lean protein. Focusing on smart food choices from each of the 5 food groups can help you stay on track at restaurants.  Choose items that are boiled, broiled, grilled, or steamed.  Choose water, milk, unsweetened iced tea, or other drinks without added sugars. If you want an alcoholic beverage, choose a lower calorie option. For example, a regular margarita can have up to 700 calories and a glass of wine has around 150.  Stay away from items that are buttered, battered, fried, or served with cream sauce. Items labeled "crispy" are usually fried, unless stated otherwise.  Ask for dressings, sauces, and syrups on the side. These are usually very high in calories, so do not eat much of them.  Watch out for salads. Many people think salads are a healthy option, but this is often not the case. Many salads come with bacon, fried chicken, lots of cheese, fried chips, and dressing. All of these items have a lot of calories. If you want a salad, choose a garden salad and ask for grilled meats or steak. Ask for the dressing on the side, or ask for olive oil and vinegar or lemon to use as dressing.  Estimate how many servings of a food you are given. For example, a serving of cooked rice is  cup or about the size of half a tennis ball or one cupcake wrapper. Knowing serving sizes will help you be aware of how much food you are eating at restaurants. The list below tells you how big or small some common portion sizes are based on everyday  objects.  1 oz--4 stacked dice.  3 oz--1 deck of cards.  1 tsp--1 dice.  1 Tbsp-- a Ping-Pong ball.  2 Tbsp--1 Ping-Pong ball.   cup--1 tennis ball or 1 cupcake wrapper.  1 cup--1 baseball.   This information is not intended to replace advice given to you by your health care provider. Make sure you discuss any questions you have with your health care provider.   Document Released: 09/27/2005 Document Revised: 10/18/2014 Document Reviewed: 08/02/2013 Elsevier Interactive Patient Education [employer].

## 2016-07-13 NOTE — Assessment & Plan Note (Signed)
-   This has now resolved with HCTZ and stopping amlodipine - no further work up for now

## 2016-07-13 NOTE — Assessment & Plan Note (Signed)
>>  ASSESSMENT AND PLAN FOR OSTEOARTHRITIS OF RIGHT KNEE WRITTEN ON 07/13/2016  9:03 AM BY NARENDRA, NISCHAL, MD  - Patient has been complaining of worsening R knee pain over the last couple of months - This is likely secondary to OA - She has been prescribed celebrex and on her last visit she was also started on voltaren gel - States her pain is well controlled on the voltaren gel - Will continue with current treatment for now - She did have a steroid injection in this knee with Dr. Margaretha Sheffield and if her pain continues to worsen would refer her back there - We also discussed the importance of weight loss

## 2016-07-14 LAB — BMP8+ANION GAP
ANION GAP: 17 mmol/L (ref 10.0–18.0)
BUN/Creatinine Ratio: 19 (ref 12–28)
BUN: 14 mg/dL (ref 8–27)
CALCIUM: 9.5 mg/dL (ref 8.7–10.3)
CHLORIDE: 95 mmol/L — AB (ref 96–106)
CO2: 28 mmol/L (ref 18–29)
Creatinine, Ser: 0.73 mg/dL (ref 0.57–1.00)
GFR calc Af Amer: 91 mL/min/{1.73_m2} (ref >59)
GFR calc non Af Amer: 79 mL/min/{1.73_m2} (ref >59)
GLUCOSE: 124 mg/dL — AB (ref 65–99)
POTASSIUM: 3.7 mmol/L (ref 3.5–5.2)
Sodium: 140 mmol/L (ref 134–144)

## 2016-07-15 ENCOUNTER — Telehealth: Payer: Self-pay | Admitting: Internal Medicine

## 2016-07-15 NOTE — Telephone Encounter (Signed)
Called patient to discuss lab work. No significant abnormality noted. No further work up for now. Patient is in agreement with plan.

## 2016-08-10 ENCOUNTER — Encounter: Payer: Self-pay | Admitting: Dietician

## 2016-08-10 ENCOUNTER — Ambulatory Visit (INDEPENDENT_AMBULATORY_CARE_PROVIDER_SITE_OTHER): Payer: Medicare Other | Admitting: Dietician

## 2016-08-10 ENCOUNTER — Other Ambulatory Visit: Payer: Self-pay

## 2016-08-10 DIAGNOSIS — R7303 Prediabetes: Secondary | ICD-10-CM

## 2016-08-10 DIAGNOSIS — Z713 Dietary counseling and surveillance: Secondary | ICD-10-CM

## 2016-08-10 DIAGNOSIS — Z6841 Body Mass Index (BMI) 40.0 and over, adult: Secondary | ICD-10-CM

## 2016-08-10 NOTE — Telephone Encounter (Signed)
Has refills at pharm but just got it 10/10 and insurance is refusing to allow refill til 11/14. Tried to call pt to find out where and how much she is using, will continue to call, lm on the 704#, unable to lm on the 336#

## 2016-08-10 NOTE — Telephone Encounter (Signed)
Thank you :)

## 2016-08-10 NOTE — Telephone Encounter (Signed)
Please have her make an appointment in [place] when I am attending and I will see her with the resident. Thank you

## 2016-08-10 NOTE — Telephone Encounter (Signed)
fluocinolone (SYNALAR) 0.01 % external solution, refill request @ [facility] on [address].

## 2016-08-10 NOTE — Telephone Encounter (Signed)
Pt called me back and I tried to explain that fluocinolone and synalar are the same med, she is having trouble understanding that they are the same, she states one is a tropical oil and the other is not so they cannot be the same, I stated it is not really and a tropical oil that it is to be used topically, on top of the skin not ingested. She finally said she understood and would call back and talk to a medicine person about tropical oil and other stuff, I informed her I was the triage nurse, I offered her an appt and she stated she would wait to see dr Dareen Piano, that will be 1/16. I explained some more and hopefully she understands

## 2016-08-10 NOTE — Progress Notes (Signed)
  Medical Nutrition Therapy:  Appt start time: 0820 end time:  0915. Visit # 1 this year, but 4th since 2015  Assessment:  Primary concerns today: weight management/loss.  Christine [name] was successful in losing ~ 10-15# in her previous effort in 2015 with weight loss during a series of Medical Nutrition therapy visits. She currently reports very little activity overall. She also thinks another problem is stress/emotional eating and loneliness and fear. She lives alone and does not feel safe at times at night there. About a month ago, she started buying fruit and started eating that instead of candy and pies. Her reported intake is insufficient in vegetables and fiber an her sleep in excess of 9 hours.    Preferred Learning Style: No preference indicated  Learning Readiness: Contemplating in some areas, Change in progress in others  ANTHROPOMETRICS: weight-312.6#, height-68", BMI-~47 obese class III WEIGHT HISTORY:her weight has gone up and down between 280-315# for past 9 years SLEEP:sleeps from 5 PM- 3 AM- 7 AM, gets up eats and goes back to bed MEDICATIONS: noted BLOOD SUGAR: has prediabetes; at risk for diabetes with A1C 6.2% DIETARY INTAKE: Usual eating pattern includes 2-3 meals and 1-2 snacks per day. Everyday foods include fruit, eggs, starches.  Avoided foods include milk, red meat.   24-hr recall:  B (  AM): eggsx2, grits, toast x2, fried chicken tenders Snk ( AM): fruit, diet soda L ( PM): string beans(canned), potatoes, biscuitsx2,  Snk ( PM): fruit, diet soda D ( PM): eats nothing after 5 PM Beverages: diet soda  Usual physical activity: walks "1 mile"  in the morings  Estimated daily energy needs for weight loss:   Progress Towards Goal(s):  In progress.   Nutritional Diagnosis:  NB-1.1 Food and nutrition-related knowledge deficit As related to lack of sufficient previous meal planning training.  As evidenced by her report of not knowing how much from each food group etc she  needs to meet her nutrient needs .    Intervention:  Nutrition education and counseling for weight loss and improved nutritional status. . Coordination of care: none at this time  Teaching Method Utilized: Visual, Auditory,Hands on Handouts given during visit include:  Barriers to learning/adherence to lifestyle change: competing values Demonstrated degree of understanding via:  Teach Back   Monitoring/Evaluation:  Dietary intake, exercise, and body weight in 4 week(s).

## 2016-08-10 NOTE — Telephone Encounter (Signed)
Spoke w/ dr Dareen Piano, called pt back and made appt for mon, he will see pt with resident in [facility]

## 2016-08-10 NOTE — Patient Instructions (Signed)
Try to eat more vegetables instead of bread, rice, grits. french fries, etc  Can eat more protein- chicken, eggs, yogurt, fish-(twice a week)  Bread is healthier choice than crackers or biscuit  Healthy choices of bread are sourdough, rye, pumpernickel or whole wheat or whole grain oatmeal.   Happy Thanksgiving

## 2016-08-16 ENCOUNTER — Ambulatory Visit (INDEPENDENT_AMBULATORY_CARE_PROVIDER_SITE_OTHER): Payer: Medicare Other | Admitting: Internal Medicine

## 2016-08-16 VITALS — BP 147/72 | Temp 97.6°F | Wt 312.0 lb

## 2016-08-16 DIAGNOSIS — M25532 Pain in left wrist: Secondary | ICD-10-CM

## 2016-08-16 DIAGNOSIS — M7989 Other specified soft tissue disorders: Secondary | ICD-10-CM

## 2016-08-16 DIAGNOSIS — M25531 Pain in right wrist: Secondary | ICD-10-CM | POA: Diagnosis not present

## 2016-08-16 DIAGNOSIS — Z87891 Personal history of nicotine dependence: Secondary | ICD-10-CM

## 2016-08-16 DIAGNOSIS — M25439 Effusion, unspecified wrist: Secondary | ICD-10-CM | POA: Insufficient documentation

## 2016-08-16 DIAGNOSIS — L409 Psoriasis, unspecified: Secondary | ICD-10-CM | POA: Insufficient documentation

## 2016-08-16 DIAGNOSIS — M25539 Pain in unspecified wrist: Secondary | ICD-10-CM | POA: Insufficient documentation

## 2016-08-16 MED ORDER — FLUOCINOLONE ACETONIDE 0.01 % EX SOLN: Freq: Two times a day (BID) | CUTANEOUS | 3 refills | Status: DC

## 2016-08-16 NOTE — Progress Notes (Signed)
   CC: Itching of scalp, pain in wrists and hands   HPI:  Christine [name] is a 80 y.o. woman with PMHx as noted below who presents today for follow up of her itchy scalp.   She reports she was diagnosed with scalp psoriasis about 15-20 years ago. She has been using Fluocinolone solution to help control her symptoms. She describes itching, flaking, and sores developing on her scalp. She notes the areas become very thick sometimes and that she scratches so hard the area will bleed. She uses Fluocinolone anywhere between two times daily to once per week depending upon "how bad it flares up." She also describes significant swelling and pain in her hands, wrists, and elbows recently. She notes her right wrist has been more swollen and painful than her left. She has been taking Tylenol Arthritis daily and Celecoxib once daily when the pain is severe. She states the pain becomes so severe that she will cry at times. She denies any rashes, myalgias, changes in weight, redness of eyes, or changes in her nails.   Past Medical History:  Diagnosis Date  . Cataract   . Depression   . Hyperlipemia   . Hypertension   . Impaired glucose tolerance   . Morbidly obese ([place])   . Pruritus   . Seasonal allergies   . Thyroid nodule     Review of Systems:  All negative except per HPI  Physical Exam:  Vitals:   08/16/16 0821  BP: (!) 147/72  Temp: 97.6 F (36.4 C)  TempSrc: Oral  SpO2: 100%  Weight: (!) 312 lb (141.5 kg)   General: elderly woman sitting up in chair, pleasant, NAD HEENT: [place]/AT, EOMI, sclera anicteric, mucus membranes moist CV: RRR, no m/g/r Pulm: CTA bilaterally, breaths non-labored Ext: No peripheral edema. Bilateral PIPs, MCPs, and wrists with swelling and tenderness to palpation. Right hand joints and wrists more tender than left side. She resists passive flexion/extension and radial/ulnar deviation of the wrists. No erythema present in hands and wrists.  Neuro: alert and  oriented x 3 Skin: Scalp with areas of flaking and erythema. Difficult to assess for plaques due to her thick hair. No nail changes. No psoriatic plaques on elbows.   Assessment & Plan:   See Encounters Tab for problem based charting.  Patient seen with Dr. Dareen Piano

## 2016-08-16 NOTE — Progress Notes (Signed)
Internal Medicine Clinic Attending  I saw and evaluated the patient.  I personally confirmed the key portions of the history and exam documented by Dr. Rivet and I reviewed pertinent patient test results.  The assessment, diagnosis, and plan were formulated together and I agree with the documentation in the resident's note. 

## 2016-08-16 NOTE — Assessment & Plan Note (Signed)
Patient with hx of scalp psoriasis for 15-20 years which has been controlled with topical treatment. Will continue Fluocinolone solution 2 times daily as needed.

## 2016-08-16 NOTE — Patient Instructions (Signed)
General Instructions: - Please take Celecoxib daily and use Tylenol as needed - An appointment with Rheumatology will be scheduled to evaluate your hand and wrist joints as there is concern it may be psoriatic arthritis - Continue to use Fluocinolone for your scalp - Follow up with Dr. Dareen Piano in January  Thank you for bringing your medicines today. This helps [place] keep you safe from mistakes.   Progress Toward Treatment Goals:  Treatment Goal 03/20/2015  Blood pressure at goal    Self Care Goals & Plans:  Self Care Goal 07/13/2016  Manage my medications take my medicines as prescribed; bring my medications to every visit; refill my medications on time  Monitor my health keep track of my blood pressure  Eat healthy foods drink diet soda or water instead of juice or soda; eat more vegetables; eat foods that are low in salt; eat baked foods instead of fried foods; eat fruit for snacks and desserts  Be physically active find an activity I enjoy  Meeting treatment goals maintain the current self-care plan    No flowsheet data found.   Care Management & Community Referrals:  Referral 05/11/2016  Referrals made for care management support -  Referrals made to community resources exercise/physical therapy

## 2016-08-16 NOTE — Assessment & Plan Note (Addendum)
She has significant bilateral wrist swelling and tenderness to the degree that she would resist passive movement. She also has swelling and tenderness of her bilateral PIP and MCP joints. She describes having significant pain that does not appear to be controlled with Tylenol and Celecoxib, however she is not taking these consistently either. I am concerned with her hx of scalp psoriasis that she may have psoriatic arthritis. I think evaluation by a rheumatologist is indicated to see if she will need methotrexate or other DMARD therapy. For the time being, I have advised her to take Celecoxib daily and not wait until the pain is severe and use Tylenol Arthritis as needed. She is agreeable to this plan. Follow up with PCP in January.

## 2016-08-17 ENCOUNTER — Other Ambulatory Visit: Payer: Self-pay | Admitting: *Deleted

## 2016-08-17 ENCOUNTER — Telehealth: Payer: Self-pay

## 2016-08-17 DIAGNOSIS — L409 Psoriasis, unspecified: Secondary | ICD-10-CM

## 2016-08-17 NOTE — Telephone Encounter (Signed)
Needs to speak with a nurse regarding meds.  

## 2016-08-17 NOTE — Telephone Encounter (Signed)
Spoke w/ pt she is confused about names of meds, she states she will go to [facility] and talk to them and call back

## 2016-08-27 ENCOUNTER — Other Ambulatory Visit: Payer: Self-pay | Admitting: *Deleted

## 2016-08-27 MED ORDER — ATORVASTATIN CALCIUM 40 MG PO TABS: 40.0000 mg | ORAL_TABLET | Freq: Every day | ORAL | 0 refills | Status: DC

## 2016-09-01 ENCOUNTER — Other Ambulatory Visit: Payer: Self-pay | Admitting: *Deleted

## 2016-09-01 DIAGNOSIS — E7849 Other hyperlipidemia: Secondary | ICD-10-CM

## 2016-09-01 MED ORDER — ATORVASTATIN CALCIUM 40 MG PO TABS: 40.0000 mg | ORAL_TABLET | Freq: Every day | ORAL | 3 refills | Status: DC

## 2016-09-06 ENCOUNTER — Telehealth: Payer: Self-pay | Admitting: Internal Medicine

## 2016-09-06 NOTE — Telephone Encounter (Signed)
APT. REMINDER CALL, LMTCB °

## 2016-09-07 ENCOUNTER — Telehealth: Payer: Self-pay | Admitting: Internal Medicine

## 2016-09-07 ENCOUNTER — Ambulatory Visit (INDEPENDENT_AMBULATORY_CARE_PROVIDER_SITE_OTHER): Payer: Medicare Other | Admitting: Dietician

## 2016-09-07 DIAGNOSIS — Z6841 Body Mass Index (BMI) 40.0 and over, adult: Secondary | ICD-10-CM | POA: Diagnosis not present

## 2016-09-07 DIAGNOSIS — Z713 Dietary counseling and surveillance: Secondary | ICD-10-CM | POA: Diagnosis not present

## 2016-09-07 NOTE — Telephone Encounter (Signed)
Patient states that pharmacy says that fluocinolone (SYNALAR) 0.01 % external solution needs a prior authroization

## 2016-09-07 NOTE — Progress Notes (Signed)
  Medical Nutrition Therapy:  Appt start time: 0820 end time:  0915. Visit # 2 this year, 5th since 2015  Assessment:  Primary concerns today: weight management/loss.  Ms Kozloff was successful in losing 1# over the last month that included the thanksgving holiday. She is limping today from knee pain from walking ~ 1 mile yesterday. She has been walking 3-4 days a week.  She agrees to try to keep written food records and feels she needs to cut back on her bread/starches an stay away from sweets.   ANTHROPOMETRICS: weight-311.7#, height-68", BMI-~47 obese class III SLEEP:sleeps from 5 PM- 1-3 AM- 7 AM, gets up eats and goes back to bed  DIETARY INTAKE:   B ( 3-7 AM): eggsx2, water  Snk ( AM): fruit, diet soda L ( PM): baked  chicken, peas, grapes or orange  Snk ( PM): fruit, diet soda D ( PM): chicken, tossed salad, 2 slice pumpernickel bread Beverages: diet soda  Usual physical activity: walks "1 mile"  in the mornings  Estimated daily energy needs for weight loss: 1600-1800 for weight loss 70-90g protein/day   Progress Towards Goal(s):  In progress.   Nutritional Diagnosis:  NB-1.1 Food and nutrition-related knowledge deficit As related to lack of sufficient previous meal planning training.  As evidenced by her report of not knowing how much from each food group etc she needs to meet her nutrient needs .    Intervention:  Nutrition education and counseling for weight loss and improved nutritional status. How to record food intake using journal and camera in phone  Teaching Method Utilized: Visual, Auditory,Hands on Handouts given during visit include:  Barriers to learning/adherence to lifestyle change: competing values Demonstrated degree of understanding via:  Teach Back   Monitoring/Evaluation:  Dietary intake, exercise, and body weight in 4 week(s).

## 2016-09-07 NOTE — Patient Instructions (Addendum)
Keep eating those vegetables. Eat twice as many vegetables as starches.   Please write down what you eat and drink in the book provided and bring it with you to your next visit.   See you in December.

## 2016-09-10 ENCOUNTER — Telehealth: Payer: Self-pay

## 2016-09-10 NOTE — Telephone Encounter (Signed)
Pt is asking about the PA, can you check on this thanks

## 2016-09-10 NOTE — Telephone Encounter (Signed)
Please call pt back regarding meds.  

## 2016-09-14 ENCOUNTER — Emergency Department (HOSPITAL_COMMUNITY)
Admission: EM | Admit: 2016-09-14 | Discharge: 2016-09-14 | Disposition: A | Discharge status: Home / Self Care | Payer: Medicare Other | Attending: Emergency Medicine | Admitting: Emergency Medicine

## 2016-09-14 ENCOUNTER — Encounter (HOSPITAL_COMMUNITY): Payer: Self-pay | Admitting: *Deleted

## 2016-09-14 DIAGNOSIS — I10 Essential (primary) hypertension: Secondary | ICD-10-CM | POA: Diagnosis not present

## 2016-09-14 DIAGNOSIS — N3001 Acute cystitis with hematuria: Secondary | ICD-10-CM

## 2016-09-14 DIAGNOSIS — R350 Frequency of micturition: Secondary | ICD-10-CM | POA: Diagnosis present

## 2016-09-14 DIAGNOSIS — Z87891 Personal history of nicotine dependence: Secondary | ICD-10-CM | POA: Diagnosis not present

## 2016-09-14 DIAGNOSIS — B9689 Other specified bacterial agents as the cause of diseases classified elsewhere: Secondary | ICD-10-CM | POA: Diagnosis not present

## 2016-09-14 LAB — URINALYSIS, ROUTINE W REFLEX MICROSCOPIC
Bilirubin Urine: NEGATIVE
Glucose, UA: NEGATIVE mg/dL
KETONES UR: NEGATIVE mg/dL
NITRITE: NEGATIVE
PH: 8 (ref 5.0–8.0)
Protein, ur: NEGATIVE mg/dL
Specific Gravity, Urine: 1.024 (ref 1.005–1.030)

## 2016-09-14 LAB — URINE MICROSCOPIC-ADD ON

## 2016-09-14 MED ORDER — HYDROCODONE-ACETAMINOPHEN 5-325 MG PO TABS
1.0000 | ORAL_TABLET | Freq: Once | ORAL | Status: AC
  Administered 2016-09-14: 1 via ORAL
  Filled 2016-09-14: qty 1

## 2016-09-14 MED ORDER — ONDANSETRON 4 MG PO TBDP: 4.0000 mg | ORAL_TABLET | Freq: Three times a day (TID) | ORAL | 0 refills | Status: DC | PRN

## 2016-09-14 MED ORDER — CIPROFLOXACIN HCL 500 MG PO TABS: 500.0000 mg | ORAL_TABLET | Freq: Two times a day (BID) | ORAL | 0 refills | Status: DC

## 2016-09-14 MED ORDER — HYDROCODONE-ACETAMINOPHEN 5-325 MG PO TABS: 1.0000 | ORAL_TABLET | Freq: Four times a day (QID) | ORAL | 0 refills | Status: DC | PRN

## 2016-09-14 MED ORDER — ONDANSETRON 4 MG PO TBDP
8.0000 mg | ORAL_TABLET | Freq: Once | ORAL | Status: AC
  Administered 2016-09-14: 8 mg via ORAL
  Filled 2016-09-14: qty 2

## 2016-09-14 MED ORDER — CIPROFLOXACIN HCL 500 MG PO TABS
500.0000 mg | ORAL_TABLET | Freq: Once | ORAL | Status: AC
  Administered 2016-09-14: 500 mg via ORAL
  Filled 2016-09-14: qty 1

## 2016-09-14 NOTE — Discharge Instructions (Signed)
Your urinalysis shows that you have a urinary tract infection. You also have moderate hemoglobin in your urine, so please follow up with your PCP for reevaluation. Complete your full course of Cipro (antibiotics), and take Norco as needed for pain and Zofran for nausea.

## 2016-09-14 NOTE — ED Triage Notes (Signed)
Pt c/o frequent, painful urination x 1 week with low back and abdominal pain. Endorses nausea as well
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
## 2016-09-14 NOTE — ED Notes (Signed)
Pt leaving department NAD. Verbalizes understanding of discharge instructions as well as prescription information. A/o x4.

## 2016-09-16 LAB — URINE CULTURE

## 2016-09-17 ENCOUNTER — Telehealth: Payer: Self-pay

## 2016-09-17 ENCOUNTER — Ambulatory Visit (INDEPENDENT_AMBULATORY_CARE_PROVIDER_SITE_OTHER): Payer: Medicare Other | Admitting: Internal Medicine

## 2016-09-17 VITALS — BP 166/80 | HR 95 | Temp 98.3°F | Wt 312.0 lb

## 2016-09-17 DIAGNOSIS — N3001 Acute cystitis with hematuria: Secondary | ICD-10-CM

## 2016-09-17 DIAGNOSIS — B961 Klebsiella pneumoniae [K. pneumoniae] as the cause of diseases classified elsewhere: Secondary | ICD-10-CM

## 2016-09-17 DIAGNOSIS — N3000 Acute cystitis without hematuria: Secondary | ICD-10-CM | POA: Diagnosis not present

## 2016-09-17 DIAGNOSIS — Z87891 Personal history of nicotine dependence: Secondary | ICD-10-CM | POA: Diagnosis not present

## 2016-09-17 NOTE — Patient Instructions (Signed)
Thank you for visiting [place] today!  Please make sure to finish taking the Ciprofloxacin (antibiotic) over the next few days.   If your symptoms return, please call and schedule an acute care visit for evaluation.

## 2016-09-17 NOTE — Assessment & Plan Note (Signed)
Seen in the ED on 12/5 for acute dysuria, urgency, frequency suprapubic pain. U/A consistent with UTI and urine culture grew >100k CFU klebsiella pneumoniae (Cipro-sensitive), and she was prescribed Cipro 500mg  BID for 7 day course, as well as prn Norco and Zofran for pain and nausea. She reports her symptoms are much improved, and that she is staying well-hydrated with water. She continues to have intermittent mild urgency and has been very sleepy, which she attributes to her medicines. It appears she has been taking the Norco scheduled rather than PRN, despite no pain - we discussed stopping this and focusing on finishing her course of Cipro. Benign exam.   Plan: - Provided reassurance, recovering well from acute cystitis - Follow up PRN for symptom recurrence

## 2016-09-17 NOTE — Progress Notes (Signed)
   CC: ER follow up for cystitis   HPI:  [name] is a 80 y.o. female with PMHx detailed below presenting for follow up of ER visit for acute cystitis. Her symptoms are resolving and she is part way through her course of antibiotics (Ciprofloxacin 500 mg BID).   See problem based assessment and plan below for additional details.  Past Medical History:  Diagnosis Date  . Cataract   . Depression   . Hyperlipemia   . Hypertension   . Impaired glucose tolerance   . Morbidly obese ([place])   . Pruritus   . Seasonal allergies   . Thyroid nodule     Review of Systems: Review of Systems  Constitutional: Positive for malaise/fatigue. Negative for chills, fever and weight loss.  Respiratory: Negative for cough and shortness of breath.   Cardiovascular: Negative for chest pain and orthopnea.  Gastrointestinal: Negative for abdominal pain, nausea and vomiting.  Genitourinary: Positive for urgency. Negative for dysuria, flank pain, frequency and hematuria.     Physical Exam: Vitals:   09/17/16 0910  BP: (!) 166/80  Pulse: 95  Temp: 98.3 F (36.8 C)  TempSrc: Oral  SpO2: 99%  Weight: (!) 312 lb (141.5 kg)   Body mass index is 46.89 kg/m. GENERAL- Obese woman sitting comfortably in exam room chair, alert, in no distress HEENT- Atraumatic,EOMI, moist mucous membranes CARDIAC- Regular rate and rhythm, no murmurs, rubs or gallops. RESP- Clear to ascultation bilaterally, no wheezing or crackles, normal work of breathing ABDOMEN- Obese, soft, nontender, nondistended BACK- Normal curvature, no CVA tenderness. EXTREMITIES- Obese bulk and normal range of motion, 1+ peripheral pulses SKIN- Warm, dry, intact, without visible rash PSYCH- Appropriate affect, clear speech, thoughts linear and goal-directed  Assessment & Plan:   See encounters tab for problem based medical decision making.  Patient seen with Dr. Lynnae January

## 2016-09-17 NOTE — Telephone Encounter (Signed)
Post ED Visit - Positive Culture Follow-up  Culture report reviewed by antimicrobial stewardship pharmacist:  []  Elenor Quinones, Pharm.D. []  Heide Guile, Pharm.D., BCPS []  Parks Neptune, Pharm.D. [x]  Alycia Rossetti, Pharm.D., BCPS []  Conroy, Pharm.D., BCPS, AAHIVP []  Legrand Como, Pharm.D., BCPS, AAHIVP []  Milus Glazier, Pharm.D. []  Stephens November, Pharm.D.  Positive urine culture Treated with Ciprofloxacin, organism sensitive to the same and no further patient follow-up is required at this time.  Genia Del 09/17/2016, 9:55 AM

## 2016-09-20 NOTE — Progress Notes (Signed)
Internal Medicine Clinic Attending  I saw and evaluated the patient.  I personally confirmed the key portions of the history and exam documented by Dr. Johnson and I reviewed pertinent patient test results.  The assessment, diagnosis, and plan were formulated together and I agree with the documentation in the resident's note.  

## 2016-09-29 ENCOUNTER — Telehealth: Payer: Self-pay | Admitting: Internal Medicine

## 2016-09-29 NOTE — Telephone Encounter (Signed)
APT. REMINDER CALL, LMTCB °

## 2016-09-30 ENCOUNTER — Ambulatory Visit (INDEPENDENT_AMBULATORY_CARE_PROVIDER_SITE_OTHER): Payer: Medicare Other | Admitting: Dietician

## 2016-09-30 DIAGNOSIS — Z6841 Body Mass Index (BMI) 40.0 and over, adult: Secondary | ICD-10-CM

## 2016-09-30 DIAGNOSIS — Z713 Dietary counseling and surveillance: Secondary | ICD-10-CM

## 2016-09-30 NOTE — Progress Notes (Signed)
  Medical Nutrition Therapy:  Appt start time: 0815 end time:  0908. Visit # 3 this year, anticipate monthly visits for another 6-12  Months  Assessment:  Primary concerns today: weight management/loss.  Christine [name] was successful in losing 1/2# over the last month. Despite her knee pain, she tries to walk and do exercise as much as possible. She kept a 30 day food journal that showed what she ate but not portions. Her intake has fruits and vegetables daily with good sources of protein, no snacks or sweets and mostly water to drink   ANTHROPOMETRICS: weight-311.1#, height-68", BMI-~46 obese class III SLEEP:sleeps from 5 PM- 1-3 AM- 7 AM, gets up eats and goes back to bed  DIETARY INTAKE: B ( 3-7 AM): eggsx1, 1 slice bread, sometimes fruit water  L ( PM): baked  Chicken opr fish, peas or greens or salad, grapes or orange, water D ( PM): chicken, tossed salad, 2 slice pumpernickel bread Beverages: diet soda, water  Usual physical activity: walks "1 mile"  in the mornings  Estimated daily energy needs for weight loss: 1600-1800 for weight loss 70-90g protein/day  Progress Towards Goal(s):  In progress.   Nutritional Diagnosis:  NB-1.1 Food and nutrition-related knowledge deficit As related to lack of sufficient previous meal planning training. Is improving As evidenced by her report of  needing to cut back on oil and sugar, bread and try different foods and ways of food preparation.    Intervention:  Nutrition education and counseling for weight loss and improved nutritional status. How to measure and  record portion sizes using journal, yoga from the chair.   Teaching Method Utilized: Visual, Auditory,Hands on Handouts given during visit include:  Barriers to learning/adherence to lifestyle change: competing values Demonstrated degree of understanding via:  Teach Back   Monitoring/Evaluation:  Dietary intake, exercise, and body weight in 4 week(s).

## 2016-09-30 NOTE — Patient Instructions (Signed)
Good job writing down your food intake.  This month please write down your portion sizes at least a few days a week.   You said you want to measure oil. margarine and possibly decrease it.   Please call in 2 weeks after the New year.   [name]  [phone]

## 2016-10-19 ENCOUNTER — Telehealth: Payer: Self-pay | Admitting: Dietician

## 2016-10-19 NOTE — Telephone Encounter (Signed)
Patient leaves voicemail that she is calling because she said she would: "Things are up and down with me and I will see you on the 16th"

## 2016-10-19 NOTE — Telephone Encounter (Signed)
Left her a  Message reminding her of her goals and provided encouragement

## 2016-10-26 ENCOUNTER — Ambulatory Visit (INDEPENDENT_AMBULATORY_CARE_PROVIDER_SITE_OTHER): Payer: Medicare Other | Admitting: Internal Medicine

## 2016-10-26 ENCOUNTER — Ambulatory Visit: Payer: Medicare Other | Admitting: Dietician

## 2016-10-26 VITALS — BP 132/69 | HR 83 | Temp 97.7°F | Ht 68.3 in | Wt 314.3 lb

## 2016-10-26 DIAGNOSIS — Z87891 Personal history of nicotine dependence: Secondary | ICD-10-CM

## 2016-10-26 DIAGNOSIS — M7989 Other specified soft tissue disorders: Secondary | ICD-10-CM | POA: Diagnosis not present

## 2016-10-26 DIAGNOSIS — Z6841 Body Mass Index (BMI) 40.0 and over, adult: Secondary | ICD-10-CM | POA: Diagnosis not present

## 2016-10-26 DIAGNOSIS — L409 Psoriasis, unspecified: Secondary | ICD-10-CM | POA: Diagnosis not present

## 2016-10-26 DIAGNOSIS — E66813 Obesity, class 3: Secondary | ICD-10-CM

## 2016-10-26 DIAGNOSIS — M25439 Effusion, unspecified wrist: Secondary | ICD-10-CM

## 2016-10-26 DIAGNOSIS — Z23 Encounter for immunization: Secondary | ICD-10-CM

## 2016-10-26 DIAGNOSIS — J309 Allergic rhinitis, unspecified: Secondary | ICD-10-CM

## 2016-10-26 DIAGNOSIS — M1711 Unilateral primary osteoarthritis, right knee: Secondary | ICD-10-CM | POA: Diagnosis present

## 2016-10-26 DIAGNOSIS — M25531 Pain in right wrist: Secondary | ICD-10-CM | POA: Diagnosis not present

## 2016-10-26 DIAGNOSIS — I10 Essential (primary) hypertension: Secondary | ICD-10-CM

## 2016-10-26 DIAGNOSIS — Z79899 Other long term (current) drug therapy: Secondary | ICD-10-CM

## 2016-10-26 DIAGNOSIS — Z0000 Encounter for general adult medical examination without abnormal findings: Secondary | ICD-10-CM

## 2016-10-26 DIAGNOSIS — M25539 Pain in unspecified wrist: Secondary | ICD-10-CM

## 2016-10-26 DIAGNOSIS — M25532 Pain in left wrist: Secondary | ICD-10-CM

## 2016-10-26 MED ORDER — HYDROCHLOROTHIAZIDE 25 MG PO TABS: 25.0000 mg | ORAL_TABLET | Freq: Every day | ORAL | 3 refills | Status: DC

## 2016-10-26 MED ORDER — CELECOXIB 200 MG PO CAPS: 200.0000 mg | ORAL_CAPSULE | Freq: Every day | ORAL | 2 refills | Status: DC

## 2016-10-26 NOTE — Patient Instructions (Signed)
-   It was a pleasure seeing you today - We will give you a pneumonia shot today -  Please follow up with the arthritis doctor - Continue with your medications for your right knee pain - We will check come blood work on your next visit - I have refilled your BP med as well as the celebrex

## 2016-10-26 NOTE — Progress Notes (Signed)
   Subjective:    Patient ID: Christine [name], female    DOB: 1935-10-16, 81 y.o.   MRN: [id number]  HPI  I have seen and examined this patient. Patient is here for routine follow-up of her hypertension and right knee pain.  Patient has no acute complaints and is compliant with her medication. She states that her right knee pain is better controlled on Celebrex and Voltaren gel. Still complains of intermittent right wrist pain and swelling which improves with the Celebrex as well. She states she has an appointment with rheumatology coming up later this month.  Patient is due for Prevnar immunization. We has discussed this on her prior appointment and she wanted time to think about this. Discussed importance of this immunization and she is going to get it done on this visit. She states she is allergic to the flu vaccine and does not want this at this time.  Review of Systems  Constitutional: Negative.   HENT: Negative.   Respiratory: Negative.   Cardiovascular: Negative.   Gastrointestinal: Negative.   Musculoskeletal: Positive for arthralgias and joint swelling. Negative for myalgias and neck pain.  Skin: Negative.   Neurological: Negative.   Psychiatric/Behavioral: Negative.        Objective:   Physical Exam  Constitutional: She is oriented to person, place, and time. She appears well-developed and well-nourished.  HENT:  Head: Normocephalic and atraumatic.  Mouth/Throat: No oropharyngeal exudate.  Neck: Neck supple.  Cardiovascular: Normal rate, regular rhythm and normal heart sounds.   Pulmonary/Chest: Effort normal and breath sounds normal. No respiratory distress. She has no wheezes.  Abdominal: Soft. Bowel sounds are normal. She exhibits no distension. There is no tenderness.  Musculoskeletal: Normal range of motion. She exhibits no edema.  Lymphadenopathy:    She has no cervical adenopathy.  Neurological: She is alert and oriented to person, place, and time.  Skin: Skin  is warm. No rash noted. No erythema.  Psychiatric: She has a normal mood and affect. Her behavior is normal.          Assessment & Plan:  Please see problem based charting for assessment and plan:

## 2016-10-26 NOTE — Assessment & Plan Note (Signed)
BP Readings from Last 3 Encounters:  10/26/16 132/69  09/17/16 (!) 166/80  09/14/16 130/74    Lab Results  Component Value Date   NA 140 07/13/2016   K 3.7 07/13/2016   CREATININE 0.73 07/13/2016    Assessment: Blood pressure control:  well controlled Progress toward BP goal:   at goal Comments: compliant with HCTZ 25 mg  Plan: Medications:  continue current medications Educational resources provided: brochure (denis need ) Self management tools provided:   Other plans:

## 2016-10-26 NOTE — Assessment & Plan Note (Signed)
-   Will c/w celebrex and voltaren gel prn for now - Patient states that pain is better controlled now. She did have a R knee steroid injection done in the past which improved her symptoms transiently - She will continue to attempt to lose weight - If pain worsens would consider referral to ortho for possible knee replacement

## 2016-10-26 NOTE — Assessment & Plan Note (Signed)
-   Patient with intermittent bilateral pain and swelling of wrists as well as b/l PIP and MCP joints - She states swelling and pain have improved on celebrex - Given her history of psoriasis and joint swelling psoriatic arthritis is a possibility - c/w celebrex for now - Patient to f/u with rheumatology later this month

## 2016-10-26 NOTE — Assessment & Plan Note (Signed)
-   Prevnar given today.

## 2016-10-26 NOTE — Assessment & Plan Note (Signed)
-   Discussed the importance of weight loss with patient again today - She states she has been inconsistent with her diet over the holidays and has put on a couple of pounds - Reiterated the importance of diet and exercise with patient and she will attempt this again this year

## 2016-10-26 NOTE — Assessment & Plan Note (Signed)
-   Patient with intermittent symptoms of rhinorrhea and sneezing which is relieved with OTC loratadine - Will c/w loratadine prn

## 2016-10-26 NOTE — Assessment & Plan Note (Signed)
>>  ASSESSMENT AND PLAN FOR OSTEOARTHRITIS OF RIGHT KNEE WRITTEN ON 10/26/2016  2:00 PM BY NARENDRA, NISCHAL, MD  - Will c/w celebrex and voltaren gel prn for now - Patient states that pain is better controlled now. She did have a R knee steroid injection done in the past which improved her symptoms transiently - She will continue to attempt to lose weight - If pain worsens would consider referral to ortho for possible knee replacement

## 2016-10-28 ENCOUNTER — Ambulatory Visit: Payer: Self-pay | Admitting: Rheumatology
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
## 2016-10-30 ENCOUNTER — Ambulatory Visit (INDEPENDENT_AMBULATORY_CARE_PROVIDER_SITE_OTHER): Payer: Medicare Other

## 2016-10-30 ENCOUNTER — Encounter: Payer: Self-pay | Admitting: Rheumatology

## 2016-10-30 ENCOUNTER — Ambulatory Visit (HOSPITAL_BASED_OUTPATIENT_CLINIC_OR_DEPARTMENT_OTHER): Payer: Medicare Other

## 2016-10-30 ENCOUNTER — Ambulatory Visit (INDEPENDENT_AMBULATORY_CARE_PROVIDER_SITE_OTHER): Payer: Medicare Other | Admitting: Rheumatology

## 2016-10-30 VITALS — BP 159/81 | HR 93 | Resp 16 | Ht 67.5 in | Wt 308.0 lb

## 2016-10-30 DIAGNOSIS — M25461 Effusion, right knee: Secondary | ICD-10-CM

## 2016-10-30 DIAGNOSIS — M25561 Pain in right knee: Secondary | ICD-10-CM | POA: Diagnosis not present

## 2016-10-30 DIAGNOSIS — M25511 Pain in right shoulder: Secondary | ICD-10-CM

## 2016-10-30 DIAGNOSIS — D638 Anemia in other chronic diseases classified elsewhere: Secondary | ICD-10-CM | POA: Diagnosis not present

## 2016-10-30 DIAGNOSIS — R7303 Prediabetes: Secondary | ICD-10-CM

## 2016-10-30 DIAGNOSIS — M79641 Pain in right hand: Secondary | ICD-10-CM

## 2016-10-30 DIAGNOSIS — M79642 Pain in left hand: Secondary | ICD-10-CM

## 2016-10-30 DIAGNOSIS — E784 Other hyperlipidemia: Secondary | ICD-10-CM | POA: Diagnosis not present

## 2016-10-30 DIAGNOSIS — M25539 Pain in unspecified wrist: Secondary | ICD-10-CM

## 2016-10-30 DIAGNOSIS — G8929 Other chronic pain: Secondary | ICD-10-CM | POA: Diagnosis not present

## 2016-10-30 DIAGNOSIS — I10 Essential (primary) hypertension: Secondary | ICD-10-CM | POA: Diagnosis not present

## 2016-10-30 DIAGNOSIS — K219 Gastro-esophageal reflux disease without esophagitis: Secondary | ICD-10-CM | POA: Diagnosis not present

## 2016-10-30 DIAGNOSIS — L409 Psoriasis, unspecified: Secondary | ICD-10-CM | POA: Diagnosis not present

## 2016-10-30 DIAGNOSIS — M25439 Effusion, unspecified wrist: Secondary | ICD-10-CM

## 2016-10-30 DIAGNOSIS — E041 Nontoxic single thyroid nodule: Secondary | ICD-10-CM

## 2016-10-30 DIAGNOSIS — E7849 Other hyperlipidemia: Secondary | ICD-10-CM

## 2016-10-30 LAB — CBC WITH DIFFERENTIAL/PLATELET
Basophils Absolute: 0 cells/uL (ref 0–200)
Basophils Relative: 0 %
EOS PCT: 1 %
Eosinophils Absolute: 56 cells/uL (ref 15–500)
HCT: 38.5 % (ref 35.0–45.0)
Hemoglobin: 12.5 g/dL (ref 11.7–15.5)
LYMPHS PCT: 27 %
Lymphs Abs: 1512 cells/uL (ref 850–3900)
MCH: 27.5 pg (ref 27.0–33.0)
MCHC: 32.5 g/dL (ref 32.0–36.0)
MCV: 84.8 fL (ref 80.0–100.0)
MONOS PCT: 5 %
MPV: 10.1 fL (ref 7.5–12.5)
Monocytes Absolute: 280 cells/uL (ref 200–950)
NEUTROS ABS: 3752 {cells}/uL (ref 1500–7800)
Neutrophils Relative %: 67 %
PLATELETS: 174 10*3/uL (ref 140–400)
RBC: 4.54 MIL/uL (ref 3.80–5.10)
RDW: 15.1 % — AB (ref 11.0–15.0)
WBC: 5.6 10*3/uL (ref 3.8–10.8)

## 2016-10-30 LAB — COMPLETE METABOLIC PANEL WITH GFR
ALT: 14 U/L (ref 6–29)
AST: 18 U/L (ref 10–35)
Albumin: 4 g/dL (ref 3.6–5.1)
Alkaline Phosphatase: 91 U/L (ref 33–130)
BUN: 14 mg/dL (ref 7–25)
CHLORIDE: 98 mmol/L (ref 98–110)
CO2: 31 mmol/L (ref 20–31)
CREATININE: 0.81 mg/dL (ref 0.60–0.93)
Calcium: 9.4 mg/dL (ref 8.6–10.4)
GFR, EST AFRICAN AMERICAN: 80 mL/min (ref >=60)
GFR, Est Non African American: 70 mL/min (ref >=60)
GLUCOSE: 112 mg/dL — AB (ref 65–99)
Potassium: 3.8 mmol/L (ref 3.5–5.3)
SODIUM: 140 mmol/L (ref 135–146)
Total Bilirubin: 0.5 mg/dL (ref 0.2–1.2)
Total Protein: 7.9 g/dL (ref 6.1–8.1)

## 2016-10-30 LAB — FERRITIN: FERRITIN: 88 ng/mL (ref 20–288)

## 2016-10-30 LAB — SEDIMENTATION RATE: Sed Rate: 52 mm/hr — ABNORMAL HIGH (ref 0–30)

## 2016-10-30 LAB — HEPATITIS PANEL, ACUTE
HCV Ab: NEGATIVE
HEP B S AG: NEGATIVE
Hep A IgM: NONREACTIVE
Hep B C IgM: NONREACTIVE

## 2016-10-30 LAB — URIC ACID: URIC ACID, SERUM: 5.6 mg/dL (ref 2.5–7.0)

## 2016-11-01 LAB — ANA: ANA: NEGATIVE

## 2016-11-01 LAB — CYCLIC CITRUL PEPTIDE ANTIBODY, IGG: Cyclic Citrullin Peptide Ab: <16 Units

## 2016-11-01 LAB — ANGIOTENSIN CONVERTING ENZYME: ANGIOTENSIN-CONVERTING ENZYME: 33 U/L (ref 9–67)

## 2016-11-02 LAB — RHEUMATOID FACTOR

## 2016-11-04 LAB — URINALYSIS, ROUTINE W REFLEX MICROSCOPIC

## 2016-11-05 ENCOUNTER — Other Ambulatory Visit: Payer: Self-pay | Admitting: *Deleted

## 2016-11-05 DIAGNOSIS — M25541 Pain in joints of right hand: Secondary | ICD-10-CM

## 2016-11-05 DIAGNOSIS — D638 Anemia in other chronic diseases classified elsewhere: Secondary | ICD-10-CM

## 2016-11-05 DIAGNOSIS — R300 Dysuria: Secondary | ICD-10-CM

## 2016-11-05 DIAGNOSIS — M25542 Pain in joints of left hand: Principal | ICD-10-CM

## 2016-11-05 LAB — HLA-B27 ANTIGEN: DNA Result:: NEGATIVE

## 2016-11-06 LAB — URINALYSIS, ROUTINE W REFLEX MICROSCOPIC
Bilirubin Urine: NEGATIVE
Glucose, UA: NEGATIVE
Hgb urine dipstick: NEGATIVE
Ketones, ur: NEGATIVE
LEUKOCYTES UA: NEGATIVE
Nitrite: NEGATIVE
PH: 7 (ref 5.0–8.0)
Protein, ur: NEGATIVE
Specific Gravity, Urine: 1.011 (ref 1.001–1.035)

## 2016-11-08 NOTE — Progress Notes (Signed)
UA negative

## 2016-11-08 NOTE — Progress Notes (Signed)
Will discuss at fu

## 2016-11-10 ENCOUNTER — Telehealth: Payer: Self-pay | Admitting: *Deleted

## 2016-11-10 NOTE — Telephone Encounter (Signed)
Call to [facility] concerning PA for Diclofenac Gel.  Pharmacy asked to run as Voltaren Gel.  Request approved.  To call patient for pick up.  Sander Nephew, RN 11/10/2016 11:41 AM.

## 2016-11-25 DIAGNOSIS — M25511 Pain in right shoulder: Secondary | ICD-10-CM | POA: Insufficient documentation

## 2016-11-25 DIAGNOSIS — G8929 Other chronic pain: Secondary | ICD-10-CM | POA: Insufficient documentation
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
## 2016-11-29 ENCOUNTER — Other Ambulatory Visit: Payer: Self-pay | Admitting: Internal Medicine

## 2016-11-30 NOTE — Telephone Encounter (Signed)
atorvastatin (LIPITOR) 40 MG tablet [facility]

## 2016-12-01 ENCOUNTER — Ambulatory Visit: Payer: Medicare Other | Admitting: Rheumatology

## 2016-12-03 ENCOUNTER — Ambulatory Visit (INDEPENDENT_AMBULATORY_CARE_PROVIDER_SITE_OTHER): Payer: Medicare Other | Admitting: Rheumatology

## 2016-12-03 ENCOUNTER — Inpatient Hospital Stay (INDEPENDENT_AMBULATORY_CARE_PROVIDER_SITE_OTHER): Payer: Medicare Other

## 2016-12-03 ENCOUNTER — Encounter: Payer: Self-pay | Admitting: Rheumatology

## 2016-12-03 VITALS — BP 138/78 | HR 82 | Resp 16 | Wt 318.0 lb

## 2016-12-03 DIAGNOSIS — L409 Psoriasis, unspecified: Secondary | ICD-10-CM | POA: Diagnosis not present

## 2016-12-03 DIAGNOSIS — E041 Nontoxic single thyroid nodule: Secondary | ICD-10-CM

## 2016-12-03 DIAGNOSIS — K219 Gastro-esophageal reflux disease without esophagitis: Secondary | ICD-10-CM

## 2016-12-03 DIAGNOSIS — M25511 Pain in right shoulder: Secondary | ICD-10-CM | POA: Diagnosis not present

## 2016-12-03 DIAGNOSIS — M25561 Pain in right knee: Secondary | ICD-10-CM | POA: Diagnosis not present

## 2016-12-03 DIAGNOSIS — M79642 Pain in left hand: Secondary | ICD-10-CM

## 2016-12-03 DIAGNOSIS — M25461 Effusion, right knee: Secondary | ICD-10-CM | POA: Diagnosis not present

## 2016-12-03 DIAGNOSIS — I10 Essential (primary) hypertension: Secondary | ICD-10-CM

## 2016-12-03 DIAGNOSIS — M1711 Unilateral primary osteoarthritis, right knee: Secondary | ICD-10-CM | POA: Diagnosis not present

## 2016-12-03 DIAGNOSIS — R7303 Prediabetes: Secondary | ICD-10-CM | POA: Diagnosis not present

## 2016-12-03 DIAGNOSIS — M79641 Pain in right hand: Secondary | ICD-10-CM | POA: Diagnosis not present

## 2016-12-03 DIAGNOSIS — G8929 Other chronic pain: Secondary | ICD-10-CM

## 2016-12-03 DIAGNOSIS — E7849 Other hyperlipidemia: Secondary | ICD-10-CM

## 2016-12-03 MED ORDER — LIDOCAINE HCL 1 % IJ SOLN
1.5000 mL | INTRAMUSCULAR | Status: AC | PRN
  Administered 2016-12-03: 1.5 mL

## 2016-12-03 MED ORDER — TRIAMCINOLONE ACETONIDE 40 MG/ML IJ SUSP
40.0000 mg | INTRAMUSCULAR | Status: AC | PRN
  Administered 2016-12-03: 40 mg via INTRA_ARTICULAR

## 2016-12-03 MED ORDER — COLCHICINE 0.6 MG PO TABS: 0.6000 mg | ORAL_TABLET | Freq: Every day | ORAL | 1 refills | Status: DC

## 2017-01-02 ENCOUNTER — Other Ambulatory Visit: Payer: Self-pay | Admitting: Internal Medicine

## 2017-02-23 ENCOUNTER — Telehealth: Payer: Self-pay | Admitting: Internal Medicine

## 2017-02-23 DIAGNOSIS — I10 Essential (primary) hypertension: Secondary | ICD-10-CM

## 2017-02-26 ENCOUNTER — Other Ambulatory Visit: Payer: Self-pay | Admitting: Internal Medicine

## 2017-02-28 NOTE — Telephone Encounter (Signed)
Dr. Dareen Piano will not have any openings until July.

## 2017-03-18 ENCOUNTER — Other Ambulatory Visit: Payer: Self-pay | Admitting: Internal Medicine

## 2017-03-31 DIAGNOSIS — M1120 Other chondrocalcinosis, unspecified site: Secondary | ICD-10-CM | POA: Insufficient documentation
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
## 2017-04-01 ENCOUNTER — Ambulatory Visit: Payer: Medicare Other | Admitting: Rheumatology

## 2017-04-01 ENCOUNTER — Other Ambulatory Visit: Payer: Self-pay | Admitting: Internal Medicine

## 2017-04-01 DIAGNOSIS — I10 Essential (primary) hypertension: Secondary | ICD-10-CM
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
## 2017-04-11 ENCOUNTER — Encounter: Payer: Self-pay | Admitting: Rheumatology

## 2017-04-11 ENCOUNTER — Ambulatory Visit (INDEPENDENT_AMBULATORY_CARE_PROVIDER_SITE_OTHER): Payer: Medicare Other | Admitting: Rheumatology

## 2017-04-11 VITALS — BP 150/72 | HR 94 | Resp 16 | Ht 68.75 in | Wt 315.0 lb

## 2017-04-11 DIAGNOSIS — L409 Psoriasis, unspecified: Secondary | ICD-10-CM | POA: Diagnosis not present

## 2017-04-11 DIAGNOSIS — K219 Gastro-esophageal reflux disease without esophagitis: Secondary | ICD-10-CM

## 2017-04-11 DIAGNOSIS — M1120 Other chondrocalcinosis, unspecified site: Secondary | ICD-10-CM | POA: Diagnosis not present

## 2017-04-11 DIAGNOSIS — E7849 Other hyperlipidemia: Secondary | ICD-10-CM

## 2017-04-11 DIAGNOSIS — G8929 Other chronic pain: Secondary | ICD-10-CM | POA: Diagnosis not present

## 2017-04-11 DIAGNOSIS — M25511 Pain in right shoulder: Secondary | ICD-10-CM

## 2017-04-11 DIAGNOSIS — M19041 Primary osteoarthritis, right hand: Secondary | ICD-10-CM

## 2017-04-11 DIAGNOSIS — M19042 Primary osteoarthritis, left hand: Secondary | ICD-10-CM

## 2017-04-11 DIAGNOSIS — M1711 Unilateral primary osteoarthritis, right knee: Secondary | ICD-10-CM | POA: Diagnosis not present

## 2017-04-11 DIAGNOSIS — R7303 Prediabetes: Secondary | ICD-10-CM

## 2017-04-11 DIAGNOSIS — I10 Essential (primary) hypertension: Secondary | ICD-10-CM | POA: Diagnosis not present

## 2017-04-11 MED ORDER — LIDOCAINE HCL 1 % IJ SOLN
1.0000 mL | INTRAMUSCULAR | Status: AC | PRN
  Administered 2017-04-11: 1 mL

## 2017-04-11 MED ORDER — TRIAMCINOLONE ACETONIDE 40 MG/ML IJ SUSP
40.0000 mg | INTRAMUSCULAR | Status: AC | PRN
  Administered 2017-04-11: 40 mg via INTRA_ARTICULAR

## 2017-06-18 ENCOUNTER — Other Ambulatory Visit: Payer: Self-pay | Admitting: Internal Medicine

## 2017-07-07 ENCOUNTER — Ambulatory Visit (INDEPENDENT_AMBULATORY_CARE_PROVIDER_SITE_OTHER): Payer: Medicare Other | Admitting: Internal Medicine

## 2017-07-07 ENCOUNTER — Encounter: Payer: Self-pay | Admitting: Dietician

## 2017-07-07 ENCOUNTER — Encounter: Payer: Self-pay | Admitting: Internal Medicine

## 2017-07-07 VITALS — BP 143/73 | HR 81 | Temp 97.9°F | Ht 68.0 in | Wt 315.6 lb

## 2017-07-07 DIAGNOSIS — M19041 Primary osteoarthritis, right hand: Secondary | ICD-10-CM

## 2017-07-07 DIAGNOSIS — E784 Other hyperlipidemia: Secondary | ICD-10-CM | POA: Diagnosis not present

## 2017-07-07 DIAGNOSIS — J309 Allergic rhinitis, unspecified: Secondary | ICD-10-CM

## 2017-07-07 DIAGNOSIS — I10 Essential (primary) hypertension: Secondary | ICD-10-CM

## 2017-07-07 DIAGNOSIS — R7303 Prediabetes: Secondary | ICD-10-CM

## 2017-07-07 DIAGNOSIS — L409 Psoriasis, unspecified: Secondary | ICD-10-CM | POA: Diagnosis not present

## 2017-07-07 DIAGNOSIS — E7849 Other hyperlipidemia: Secondary | ICD-10-CM

## 2017-07-07 DIAGNOSIS — M19042 Primary osteoarthritis, left hand: Secondary | ICD-10-CM | POA: Diagnosis not present

## 2017-07-07 LAB — GLUCOSE, CAPILLARY: Glucose-Capillary: 131 mg/dL — ABNORMAL HIGH (ref 65–99)

## 2017-07-07 LAB — POCT GLYCOSYLATED HEMOGLOBIN (HGB A1C): Hemoglobin A1C: 6.5

## 2017-07-07 MED ORDER — ATORVASTATIN CALCIUM 40 MG PO TABS: 40.0000 mg | ORAL_TABLET | Freq: Every day | ORAL | 3 refills | Status: DC

## 2017-07-07 MED ORDER — KETOCONAZOLE 2 % EX SHAM: 1.0000 "application " | MEDICATED_SHAMPOO | CUTANEOUS | 0 refills | Status: DC

## 2017-07-07 MED ORDER — LORATADINE 10 MG PO TABS: ORAL_TABLET | ORAL | 1 refills | Status: DC

## 2017-07-07 MED ORDER — HYDROCHLOROTHIAZIDE 25 MG PO TABS: 25.0000 mg | ORAL_TABLET | Freq: Every day | ORAL | 1 refills | Status: DC

## 2017-07-07 MED ORDER — CELECOXIB 200 MG PO CAPS: 200.0000 mg | ORAL_CAPSULE | Freq: Every day | ORAL | 0 refills | Status: DC

## 2017-07-07 MED ORDER — COLCHICINE 0.6 MG PO TABS: 0.6000 mg | ORAL_TABLET | Freq: Every day | ORAL | 1 refills | Status: DC

## 2017-07-07 NOTE — Patient Instructions (Signed)
-   It was a pleasure to see you - Your BP is a little high. If it remains high on your follow up visit despite diet and exercise we will add another medication - I have refilled your medication today - Please follow up with me in 4-5 months - We will check some blood work today

## 2017-07-08 LAB — BMP8+ANION GAP
ANION GAP: 16 mmol/L (ref 10.0–18.0)
BUN/Creatinine Ratio: 23 (ref 12–28)
BUN: 14 mg/dL (ref 8–27)
CALCIUM: 9.7 mg/dL (ref 8.7–10.3)
CO2: 29 mmol/L (ref 20–29)
CREATININE: 0.61 mg/dL (ref 0.57–1.00)
Chloride: 95 mmol/L — ABNORMAL LOW (ref 96–106)
GFR, EST AFRICAN AMERICAN: 100 mL/min/{1.73_m2} (ref >59)
GFR, EST NON AFRICAN AMERICAN: 87 mL/min/{1.73_m2} (ref >59)
Glucose: 135 mg/dL — ABNORMAL HIGH (ref 65–99)
POTASSIUM: 3.7 mmol/L (ref 3.5–5.2)
Sodium: 140 mmol/L (ref 134–144)

## 2017-07-08 NOTE — Assessment & Plan Note (Signed)
-   I have refilled her atorvastatin today - She denies any adverse effects - Will recheck lipid panel on next visit

## 2017-07-08 NOTE — Assessment & Plan Note (Signed)
-   Patient's symptoms are well controlled off meds - She does complain of itching occasionally but uses ketoconozole shampoo and would like a refill of this - I am unsure if she also has also has an associated fungal infection. No evidence of active lesions at this time - Will refill this for now

## 2017-07-08 NOTE — Assessment & Plan Note (Signed)
-   Patient's symptoms are well controlled with claritin - Will refill this today

## 2017-07-08 NOTE — Progress Notes (Signed)
   Subjective:    Patient ID: Christine [name], female    DOB: Oct 28, 1935, 81 y.o.   MRN: [id number]  HPI  I have seen and examined this patient. She is here for routine follow up of her HTN and pre diabetes.  Patient states that she has been having pain in her joints secondary to her arthritis and is unable to do the things she used to. She is following up with rheum for this. She states that she has unhealthy eating habits and understands that she needs to lose weight.  She is compliant with her medications and denies other complaints at this time.   Review of Systems  Constitutional: Negative.   HENT: Negative.   Respiratory: Negative.   Cardiovascular: Negative.   Gastrointestinal: Negative.   Musculoskeletal: Positive for arthralgias. Negative for back pain.  Skin: Negative.   Neurological: Negative.   Psychiatric/Behavioral: Negative.        Objective:   Physical Exam  Constitutional: She is oriented to person, place, and time. She appears well-developed and well-nourished.  HENT:  Head: Normocephalic and atraumatic.  Mouth/Throat: No oropharyngeal exudate.  Neck: Normal range of motion. Neck supple.  Cardiovascular: Normal rate, regular rhythm and normal heart sounds.   Pulmonary/Chest: Effort normal and breath sounds normal. No respiratory distress. She has no wheezes.  Abdominal: Soft. Bowel sounds are normal. She exhibits no distension. There is no tenderness.  Musculoskeletal: Normal range of motion. She exhibits no edema.  Mild left wrist swelling but no erythema, no tenderness, no increased warmth  Lymphadenopathy:    She has no cervical adenopathy.  Neurological: She is alert and oriented to person, place, and time.  Skin: Skin is warm. No rash noted. No erythema.  Psychiatric: She has a normal mood and affect. Her behavior is normal.          Assessment & Plan:   Please see problem based charting for assessment and plan:

## 2017-07-08 NOTE — Assessment & Plan Note (Signed)
-   Patient follows with rheumatology - Dr. Kathi Ludwig - She is on colchicine and celebrex per rheum - She still complains of some swelling and pain in her wrists but states her knee pain are well controlled with current meds and voltaren gel prn - I advised her to use some voltaren gel on her wrists as well - She will f/u with rheum as an outpatient

## 2017-07-08 NOTE — Assessment & Plan Note (Addendum)
BP Readings from Last 3 Encounters:  07/07/17 (!) 143/73  04/11/17 (!) 150/72  12/03/16 138/78    Lab Results  Component Value Date   NA 140 07/07/2017   K 3.7 07/07/2017   CREATININE 0.61 07/07/2017    Assessment: Blood pressure control:  fair Progress toward BP goal:   improved Comments: patient is compliant with HCTZ 25 mg  Plan: Medications:  continue current medications Educational resources provided:   Self management tools provided:   Other plans: Will check BMP today

## 2017-07-08 NOTE — Assessment & Plan Note (Signed)
-   Her A1C was 6.5 today - We discussed the importance of diet and exercise and weight loss - She will attempt to follow a better diet and exercise regimen - Would consider starting metformin if continues to worsen - BMP was wnl today

## 2017-07-11 ENCOUNTER — Telehealth: Payer: Self-pay | Admitting: Internal Medicine

## 2017-07-11 NOTE — Telephone Encounter (Signed)
I called the patient today to discuss the results of her lab work. Hemoglobin A1c was 6.5. I explained to the patient again the importance of diet and exercise. We'll hold off on medication for now and recheck A1c in 3-6 months. Her BMP was within normal limits except for mildly decreased chloride. No further workup for now. Patient expresses understanding and is in agreement with plan.

## 2017-08-23 ENCOUNTER — Other Ambulatory Visit: Payer: Self-pay | Admitting: Internal Medicine

## 2017-08-23 DIAGNOSIS — M19042 Primary osteoarthritis, left hand: Secondary | ICD-10-CM

## 2017-08-23 DIAGNOSIS — M19041 Primary osteoarthritis, right hand: Secondary | ICD-10-CM
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
## 2017-10-01 ENCOUNTER — Other Ambulatory Visit: Payer: Self-pay | Admitting: Internal Medicine

## 2017-10-01 DIAGNOSIS — I10 Essential (primary) hypertension: Secondary | ICD-10-CM

## 2017-10-06 NOTE — Telephone Encounter (Signed)
Happy to refill today, but also needs follow up scheduled with Dr. Dareen Piano in 1-2 months. Thanks!

## 2017-10-06 NOTE — Telephone Encounter (Signed)
hydrochlorothiazide (HYDRODIURIL) 25 MG tablet, Refill request @ [facility] on [address]. Would like this med by today. Please call pt back.

## 2017-10-06 NOTE — Telephone Encounter (Signed)
Message sent to front office to schedule an appt. 

## 2017-10-09 ENCOUNTER — Other Ambulatory Visit: Payer: Self-pay | Admitting: Internal Medicine

## 2017-10-09 DIAGNOSIS — M19041 Primary osteoarthritis, right hand: Secondary | ICD-10-CM

## 2017-10-09 DIAGNOSIS — M19042 Primary osteoarthritis, left hand: Secondary | ICD-10-CM

## 2017-10-12 ENCOUNTER — Ambulatory Visit: Payer: Medicare Other | Admitting: Rheumatology
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
## 2017-10-14 ENCOUNTER — Encounter: Payer: Self-pay | Admitting: Rheumatology

## 2017-10-14 ENCOUNTER — Ambulatory Visit (INDEPENDENT_AMBULATORY_CARE_PROVIDER_SITE_OTHER): Payer: Medicare Other

## 2017-10-14 ENCOUNTER — Ambulatory Visit (INDEPENDENT_AMBULATORY_CARE_PROVIDER_SITE_OTHER): Payer: Medicare Other | Admitting: Rheumatology

## 2017-10-14 ENCOUNTER — Ambulatory Visit (INDEPENDENT_AMBULATORY_CARE_PROVIDER_SITE_OTHER): Payer: Self-pay

## 2017-10-14 VITALS — BP 152/82 | HR 88 | Resp 20 | Ht 68.25 in | Wt 314.0 lb

## 2017-10-14 DIAGNOSIS — M19041 Primary osteoarthritis, right hand: Secondary | ICD-10-CM

## 2017-10-14 DIAGNOSIS — Z5181 Encounter for therapeutic drug level monitoring: Secondary | ICD-10-CM | POA: Diagnosis not present

## 2017-10-14 DIAGNOSIS — M79642 Pain in left hand: Secondary | ICD-10-CM | POA: Diagnosis not present

## 2017-10-14 DIAGNOSIS — M19042 Primary osteoarthritis, left hand: Secondary | ICD-10-CM | POA: Diagnosis not present

## 2017-10-14 DIAGNOSIS — L409 Psoriasis, unspecified: Secondary | ICD-10-CM

## 2017-10-14 DIAGNOSIS — M79641 Pain in right hand: Secondary | ICD-10-CM

## 2017-10-14 DIAGNOSIS — Z8679 Personal history of other diseases of the circulatory system: Secondary | ICD-10-CM

## 2017-10-14 DIAGNOSIS — Z8639 Personal history of other endocrine, nutritional and metabolic disease: Secondary | ICD-10-CM | POA: Diagnosis not present

## 2017-10-14 DIAGNOSIS — R7303 Prediabetes: Secondary | ICD-10-CM

## 2017-10-14 DIAGNOSIS — Z8719 Personal history of other diseases of the digestive system: Secondary | ICD-10-CM | POA: Diagnosis not present

## 2017-10-14 DIAGNOSIS — M1120 Other chondrocalcinosis, unspecified site: Secondary | ICD-10-CM

## 2017-10-14 DIAGNOSIS — M1711 Unilateral primary osteoarthritis, right knee: Secondary | ICD-10-CM

## 2017-10-14 LAB — CBC WITH DIFFERENTIAL/PLATELET
BASOS ABS: 18 {cells}/uL (ref 0–200)
Basophils Relative: 0.3 %
EOS PCT: 0.8 %
Eosinophils Absolute: 48 cells/uL (ref 15–500)
HEMATOCRIT: 39.3 % (ref 35.0–45.0)
Hemoglobin: 13.1 g/dL (ref 11.7–15.5)
LYMPHS ABS: 1434 {cells}/uL (ref 850–3900)
MCH: 27 pg (ref 27.0–33.0)
MCHC: 33.3 g/dL (ref 32.0–36.0)
MCV: 81 fL (ref 80.0–100.0)
MPV: 10.9 fL (ref 7.5–12.5)
Monocytes Relative: 4.7 %
NEUTROS PCT: 70.3 %
Neutro Abs: 4218 cells/uL (ref 1500–7800)
Platelets: 167 10*3/uL (ref 140–400)
RBC: 4.85 10*6/uL (ref 3.80–5.10)
RDW: 13.7 % (ref 11.0–15.0)
Total Lymphocyte: 23.9 %
WBC mixed population: 282 cells/uL (ref 200–950)
WBC: 6 10*3/uL (ref 3.8–10.8)

## 2017-10-14 LAB — COMPLETE METABOLIC PANEL WITH GFR
AG RATIO: 1.2 (calc) (ref 1.0–2.5)
ALKALINE PHOSPHATASE (APISO): 88 U/L (ref 33–130)
ALT: 14 U/L (ref 6–29)
AST: 18 U/L (ref 10–35)
Albumin: 4.1 g/dL (ref 3.6–5.1)
BILIRUBIN TOTAL: 0.5 mg/dL (ref 0.2–1.2)
BUN: 20 mg/dL (ref 7–25)
CHLORIDE: 98 mmol/L (ref 98–110)
CO2: 35 mmol/L — AB (ref 20–32)
CREATININE: 0.79 mg/dL (ref 0.60–0.93)
Calcium: 9.7 mg/dL (ref 8.6–10.4)
GFR, Est African American: 83 mL/min/{1.73_m2} (ref >=60)
GFR, Est Non African American: 71 mL/min/{1.73_m2} (ref >=60)
Globulin: 3.5 g/dL (calc) (ref 1.9–3.7)
Glucose, Bld: 141 mg/dL — ABNORMAL HIGH (ref 65–99)
POTASSIUM: 4 mmol/L (ref 3.5–5.3)
SODIUM: 139 mmol/L (ref 135–146)
Total Protein: 7.6 g/dL (ref 6.1–8.1)

## 2017-10-14 MED ORDER — COLCHICINE 0.6 MG PO TABS: 0.6000 mg | ORAL_TABLET | Freq: Every day | ORAL | 1 refills | Status: DC

## 2017-10-14 MED ORDER — DICLOFENAC SODIUM 1 % TD GEL: TRANSDERMAL | 3 refills | Status: DC

## 2017-10-14 NOTE — Patient Instructions (Addendum)
CMC brace    Hand Exercises Hand exercises can be helpful to almost anyone. These exercises can strengthen the hands, improve flexibility and movement, and increase blood flow to the hands. These results can make work and daily tasks easier. Hand exercises can be especially helpful for people who have joint pain from arthritis or have nerve damage from overuse (carpal tunnel syndrome). These exercises can also help people who have injured a hand. Most of these hand exercises are fairly gentle stretching routines. You can do them often throughout the day. Still, it is a good idea to ask your health care provider which exercises would be best for you. Warming your hands before exercise may help to reduce stiffness. You can do this with gentle massage or by placing your hands in warm water for 15 minutes. Also, make sure you pay attention to your level of hand pain as you begin an exercise routine. Exercises Knuckle Bend Repeat this exercise 5-10 times with each hand. 1. Stand or sit with your arm, hand, and all five fingers pointed straight up. Make sure your wrist is straight. 2. Gently and slowly bend your fingers down and inward until the tips of your fingers are touching the tops of your palm. 3. Hold this position for a few seconds. 4. Extend your fingers out to their original position, all pointing straight up again.  Finger Fan Repeat this exercise 5-10 times with each hand. 1. Hold your arm and hand out in front of you. Keep your wrist straight. 2. Squeeze your hand into a fist. 3. Hold this position for a few seconds. 4. Edison Simon out, or spread apart, your hand and fingers as much as possible, stretching every joint fully.  Tabletop Repeat this exercise 5-10 times with each hand. 1. Stand or sit with your arm, hand, and all five fingers pointed straight up. Make sure your wrist is straight. 2. Gently and slowly bend your fingers at the knuckles where they meet the hand until your hand is  making an upside-down L shape. Your fingers should form a tabletop. 3. Hold this position for a few seconds. 4. Extend your fingers out to their original position, all pointing straight up again.  Making Os Repeat this exercise 5-10 times with each hand. 1. Stand or sit with your arm, hand, and all five fingers pointed straight up. Make sure your wrist is straight. 2. Make an O shape by touching your pointer finger to your thumb. Hold for a few seconds. Then open your hand wide. 3. Repeat this motion with each finger on your hand.  Table Spread Repeat this exercise 5-10 times with each hand. 1. Place your hand on a table with your palm facing down. Make sure your wrist is straight. 2. Spread your fingers out as much as possible. Hold this position for a few seconds. 3. Slide your fingers back together again. Hold for a few seconds.  Ball Grip  Repeat this exercise 10-15 times with each hand. 1. Hold a tennis ball or another soft ball in your hand. 2. While slowly increasing pressure, squeeze the ball as hard as possible. 3. Squeeze as hard as you can for 3-5 seconds. 4. Relax and repeat.  Wrist Curls Repeat this exercise 10-15 times with each hand. 1. Sit in a chair that has armrests. 2. Hold a light weight in your hand, such as a dumbbell that weighs 1-3 pounds (0.5-1.4 kg). Ask your health care provider what weight would be best for you. 3.  Rest your hand just over the end of the chair arm with your palm facing up. 4. Gently pivot your wrist up and down while holding the weight. Do not twist your wrist from side to side.  Contact a health care provider if:  Your hand pain or discomfort gets much worse when you do an exercise.  Your hand pain or discomfort does not improve within 2 hours after you exercise. If you have any of these problems, stop doing these exercises right away. Do not do them again unless your health care provider says that you can. Get help right away  if:  You develop sudden, severe hand pain. If this happens, stop doing these exercises right away. Do not do them again unless your health care provider says that you can. This information is not intended to replace advice given to you by your health care provider. Make sure you discuss any questions you have with your health care provider. Document Released: 09/08/2015 Document Revised: 03/04/2016 Document Reviewed: 04/07/2015 Elsevier Interactive Patient Education  [employer].

## 2017-10-17 ENCOUNTER — Ambulatory Visit: Payer: Medicare Other | Admitting: Rheumatology

## 2017-10-17 NOTE — Progress Notes (Signed)
Glucose elevated. She was most likely not fasting.  All other labs are WNL

## 2017-11-04 ENCOUNTER — Ambulatory Visit (INDEPENDENT_AMBULATORY_CARE_PROVIDER_SITE_OTHER): Payer: Medicare Other | Admitting: Internal Medicine

## 2017-11-04 ENCOUNTER — Other Ambulatory Visit: Payer: Self-pay

## 2017-11-04 VITALS — BP 193/94 | HR 96 | Temp 97.5°F | Ht 68.5 in | Wt 313.6 lb

## 2017-11-04 DIAGNOSIS — Z79899 Other long term (current) drug therapy: Secondary | ICD-10-CM

## 2017-11-04 DIAGNOSIS — R339 Retention of urine, unspecified: Secondary | ICD-10-CM

## 2017-11-04 DIAGNOSIS — J069 Acute upper respiratory infection, unspecified: Secondary | ICD-10-CM | POA: Diagnosis not present

## 2017-11-04 DIAGNOSIS — R32 Unspecified urinary incontinence: Secondary | ICD-10-CM

## 2017-11-04 DIAGNOSIS — I10 Essential (primary) hypertension: Secondary | ICD-10-CM | POA: Diagnosis not present

## 2017-11-04 DIAGNOSIS — Z87891 Personal history of nicotine dependence: Secondary | ICD-10-CM | POA: Diagnosis not present

## 2017-11-04 LAB — POCT URINALYSIS DIPSTICK
Bilirubin, UA: NEGATIVE
Glucose, UA: NEGATIVE
KETONES UA: NEGATIVE
Leukocytes, UA: NEGATIVE
NITRITE UA: NEGATIVE
PH UA: 5.5 (ref 5.0–8.0)
PROTEIN UA: NEGATIVE
SPEC GRAV UA: 1.025 (ref 1.010–1.025)
UROBILINOGEN UA: 0.2 U/dL

## 2017-11-04 LAB — GLUCOSE, CAPILLARY: GLUCOSE-CAPILLARY: 99 mg/dL (ref 65–99)

## 2017-11-04 MED ORDER — PHENOL 1.4 % MT LIQD: 1.0000 | OROMUCOSAL | 0 refills | Status: DC | PRN

## 2017-11-04 MED ORDER — DILTIAZEM HCL ER 180 MG PO CP24: 180.0000 mg | ORAL_CAPSULE | Freq: Every day | ORAL | 2 refills | Status: DC

## 2017-11-04 MED ORDER — BENZONATATE 100 MG PO CAPS: 100.0000 mg | ORAL_CAPSULE | Freq: Three times a day (TID) | ORAL | 0 refills | Status: DC | PRN

## 2017-11-04 NOTE — Patient Instructions (Addendum)
Ms. Demonbreun,  It was a pleasure meeting you today. For your cold please use coricidin.  Please start Diltiazem for your blood pressure.  Please follow up with your primary care doctor.

## 2017-11-04 NOTE — Progress Notes (Signed)
   CC: Increased urinary frequency   HPI:  [name] is a 82 y.o. female with history noted below presents to the acute care clinic for increased urinary frequency.  She states for the past week she has had to use the restroom more frequently. She states there is some burning with urination. She denies discharge or pruritus. She states that sometimes she has to sit on the toilet longer periods of time in order to get urine out. She also reports a one-day history of cough with white sputum production, sore throat and nasal congestion. She has not tried any medications for her symptoms.   Past Medical History:  Diagnosis Date  . Cataract   . Depression   . Hyperlipemia   . Hypertension   . Impaired glucose tolerance   . Morbidly obese ([place])   . Pruritus   . Seasonal allergies   . Thyroid nodule     Review of Systems:  Review of Systems  Constitutional: Negative for chills, fever and malaise/fatigue.  HENT: Positive for congestion.   Gastrointestinal: Negative for nausea and vomiting.  Genitourinary: Positive for dysuria and frequency. Negative for hematuria.     Physical Exam:  Vitals:   11/04/17 1343  BP: (!) 193/94  Pulse: 96  Temp: (!) 97.5 F (36.4 C)  TempSrc: Oral  SpO2: 100%  Weight: (!) 313 lb 9.6 oz (142.2 kg)  Height: 5' 8.5" (1.74 m)   Physical Exam  Constitutional: She is well-developed, well-nourished, and in no distress.  HENT:  Mouth/Throat: No oropharyngeal exudate.  Oropharyngeal area with redness and irritation  Cardiovascular: Normal rate and regular rhythm. Exam reveals no gallop and no friction rub.  No murmur heard. Pulmonary/Chest: Effort normal and breath sounds normal. No respiratory distress. She has no wheezes. She has no rales.  Skin: Skin is warm and dry.    Assessment & Plan:   See encounters tab for problem based medical decision making.    Patient discussed with Dr. Lynnae January

## 2017-11-07 DIAGNOSIS — J069 Acute upper respiratory infection, unspecified: Secondary | ICD-10-CM | POA: Insufficient documentation

## 2017-11-07 DIAGNOSIS — R339 Retention of urine, unspecified: Principal | ICD-10-CM

## 2017-11-07 DIAGNOSIS — R32 Unspecified urinary incontinence: Secondary | ICD-10-CM | POA: Insufficient documentation

## 2017-11-07 NOTE — Assessment & Plan Note (Signed)
Assessment: urinary retention Patient reports an increase in urinary frequency and dysuria for the past week. She also reports urinary incontinence. Urine dipstick was negative for nitrites and leukocytes. Glucose was 99.  After patient had attempted to empty her bladder twice her bladder scan showed a post void residual of 130 cc.  Will refer to urology.  Plan -urine dipstick -CBG -Urology referral

## 2017-11-07 NOTE — Assessment & Plan Note (Signed)
Assessment: Essential hypertension Patient reports compliance with hydrochlorothiazide 25 mg daily. She states the last time she took her medication was this morning. Her blood pressure is uncontrolled at 193/94 today. Will add diltiazem to blood pressure medication regimen and have patient follow-up with PCP.  Plan -Start diltiazem -Continue hydrochlorothiazide

## 2017-11-07 NOTE — Assessment & Plan Note (Addendum)
Assessment: [name] Patient presents with a one-day history of nasal congestion, sore throat and cough. She states her cough it is productive with white sputum. She denies fever chills, shortness of breath, or body aches.  Patient likely has a viral [name] and will treat conservatively with tessalon Perles, coricidin and chloraseptic spray.  Plan -tessalon perles -coricidin  -chloraseptic spray

## 2017-11-08 NOTE — Progress Notes (Signed)
Internal Medicine Clinic Attending  Case discussed with Dr. Hoffman at the time of the visit.  We reviewed the resident's history and exam and pertinent patient test results.  I agree with the assessment, diagnosis, and plan of care documented in the resident's note.  

## 2017-11-09 ENCOUNTER — Other Ambulatory Visit: Payer: Self-pay | Admitting: *Deleted

## 2017-11-09 ENCOUNTER — Telehealth: Payer: Self-pay

## 2017-11-09 DIAGNOSIS — I10 Essential (primary) hypertension: Secondary | ICD-10-CM

## 2017-11-09 MED ORDER — HYDROCHLOROTHIAZIDE 25 MG PO TABS: 25.0000 mg | ORAL_TABLET | Freq: Every day | ORAL | 1 refills | Status: DC

## 2017-11-09 NOTE — Telephone Encounter (Signed)
Needs to speak with a nurse about med. Please call pt back.  

## 2017-11-09 NOTE — Telephone Encounter (Signed)
I agree

## 2017-11-09 NOTE — Telephone Encounter (Signed)
Called pt, she states she needs refill on HCTZ also wanted to know if she is supposed to take the new medicine and hctz , advised yes, do you agree

## 2017-11-11 ENCOUNTER — Other Ambulatory Visit: Payer: Self-pay | Admitting: Internal Medicine

## 2017-11-11 ENCOUNTER — Telehealth: Payer: Self-pay | Admitting: Internal Medicine

## 2017-11-11 DIAGNOSIS — M19041 Primary osteoarthritis, right hand: Secondary | ICD-10-CM

## 2017-11-11 DIAGNOSIS — M19042 Primary osteoarthritis, left hand: Secondary | ICD-10-CM

## 2017-11-11 NOTE — Telephone Encounter (Signed)
Patient said that she talked to nurse yesterday and they was sending her medicine to the pharmacy, she is wanted to get the medicine the weekend. pls contact patient

## 2017-11-11 NOTE — Telephone Encounter (Signed)
Called pt - no answer; left message HCTZ rx was filled and sent to [facility] pharmacy.

## 2017-11-23 ENCOUNTER — Telehealth: Payer: Self-pay

## 2017-11-23 ENCOUNTER — Telehealth: Payer: Self-pay | Admitting: Rheumatology

## 2017-11-23 MED ORDER — MITIGARE 0.6 MG PO CAPS: 0.6000 mg | ORAL_CAPSULE | Freq: Every day | ORAL | 0 refills | Status: DC

## 2017-11-23 NOTE — Telephone Encounter (Signed)
Per Hazel Sams, PA-C okay to change prescription to [place]   Prescription sent to the pharmacy and patient advised.

## 2017-11-23 NOTE — Telephone Encounter (Signed)
Requesting to speak with a nurse about meds.  

## 2017-11-23 NOTE — Telephone Encounter (Signed)
rtc pt wanted to know about a med dr devanshwar prescribes for her, she is ask to call that office and is agreeable

## 2017-11-23 NOTE — Telephone Encounter (Signed)
Patient calling colchicine  6mg  Per patient medication went from $2. To 150. Please call patient to discuss diference in price, and possible replacement medication.

## 2017-12-08 DIAGNOSIS — R350 Frequency of micturition: Secondary | ICD-10-CM | POA: Diagnosis not present

## 2017-12-08 DIAGNOSIS — N3941 Urge incontinence: Secondary | ICD-10-CM | POA: Diagnosis not present

## 2017-12-16 DIAGNOSIS — M6281 Muscle weakness (generalized): Secondary | ICD-10-CM | POA: Diagnosis not present

## 2017-12-16 DIAGNOSIS — M62838 Other muscle spasm: Secondary | ICD-10-CM | POA: Diagnosis not present

## 2017-12-16 DIAGNOSIS — N3941 Urge incontinence: Secondary | ICD-10-CM | POA: Diagnosis not present

## 2017-12-16 DIAGNOSIS — R350 Frequency of micturition: Secondary | ICD-10-CM | POA: Diagnosis not present

## 2017-12-20 ENCOUNTER — Other Ambulatory Visit: Payer: Self-pay

## 2017-12-20 ENCOUNTER — Ambulatory Visit (INDEPENDENT_AMBULATORY_CARE_PROVIDER_SITE_OTHER): Payer: Medicare Other | Admitting: Internal Medicine

## 2017-12-20 ENCOUNTER — Telehealth (INDEPENDENT_AMBULATORY_CARE_PROVIDER_SITE_OTHER): Payer: Medicare Other | Admitting: *Deleted

## 2017-12-20 ENCOUNTER — Encounter: Payer: Self-pay | Admitting: Internal Medicine

## 2017-12-20 VITALS — BP 124/48 | HR 80 | Temp 86.0°F | Ht 68.4 in | Wt 311.8 lb

## 2017-12-20 DIAGNOSIS — Z87891 Personal history of nicotine dependence: Secondary | ICD-10-CM

## 2017-12-20 DIAGNOSIS — R7303 Prediabetes: Secondary | ICD-10-CM | POA: Diagnosis not present

## 2017-12-20 DIAGNOSIS — I10 Essential (primary) hypertension: Secondary | ICD-10-CM

## 2017-12-20 DIAGNOSIS — R339 Retention of urine, unspecified: Secondary | ICD-10-CM

## 2017-12-20 DIAGNOSIS — M7989 Other specified soft tissue disorders: Secondary | ICD-10-CM | POA: Diagnosis not present

## 2017-12-20 DIAGNOSIS — J309 Allergic rhinitis, unspecified: Secondary | ICD-10-CM | POA: Diagnosis not present

## 2017-12-20 DIAGNOSIS — M159 Polyosteoarthritis, unspecified: Secondary | ICD-10-CM | POA: Diagnosis not present

## 2017-12-20 DIAGNOSIS — Z79899 Other long term (current) drug therapy: Secondary | ICD-10-CM

## 2017-12-20 DIAGNOSIS — M1120 Other chondrocalcinosis, unspecified site: Secondary | ICD-10-CM | POA: Diagnosis not present

## 2017-12-20 DIAGNOSIS — Z23 Encounter for immunization: Secondary | ICD-10-CM

## 2017-12-20 DIAGNOSIS — M1711 Unilateral primary osteoarthritis, right knee: Secondary | ICD-10-CM

## 2017-12-20 DIAGNOSIS — E041 Nontoxic single thyroid nodule: Secondary | ICD-10-CM | POA: Diagnosis not present

## 2017-12-20 DIAGNOSIS — Z6841 Body Mass Index (BMI) 40.0 and over, adult: Secondary | ICD-10-CM

## 2017-12-20 DIAGNOSIS — R350 Frequency of micturition: Secondary | ICD-10-CM | POA: Diagnosis not present

## 2017-12-20 DIAGNOSIS — Z0000 Encounter for general adult medical examination without abnormal findings: Secondary | ICD-10-CM

## 2017-12-20 DIAGNOSIS — J069 Acute upper respiratory infection, unspecified: Secondary | ICD-10-CM

## 2017-12-20 LAB — POCT URINALYSIS DIPSTICK
BILIRUBIN UA: NEGATIVE
Glucose, UA: NEGATIVE
KETONES UA: NEGATIVE
RBC UA: NEGATIVE
SPEC GRAV UA: 1.01 (ref 1.010–1.025)
pH, UA: 6 (ref 5.0–8.0)

## 2017-12-20 LAB — GLUCOSE, CAPILLARY: GLUCOSE-CAPILLARY: 99 mg/dL (ref 65–99)

## 2017-12-20 LAB — POCT GLYCOSYLATED HEMOGLOBIN (HGB A1C): HEMOGLOBIN A1C: 5.9

## 2017-12-20 MED ORDER — AMLODIPINE BESYLATE 5 MG PO TABS: 5.0000 mg | ORAL_TABLET | Freq: Every day | ORAL | 0 refills | Status: DC

## 2017-12-20 MED ORDER — LORATADINE 10 MG PO TABS: ORAL_TABLET | ORAL | 1 refills | Status: DC

## 2017-12-20 NOTE — Assessment & Plan Note (Signed)
-  This problem is chronic and stable -Symptoms are well controlled on current regimen -We will continue with Voltaren gel, colchicine and Tylenol arthritis as needed -No further workup at this time

## 2017-12-20 NOTE — Assessment & Plan Note (Signed)
-  This problem is chronic and stable -Patient follows up with Dr. Estanislado Pandy for pseudogout as well as primary OA especially in her hands and knees -We will continue colchicine for this -Patient is to stop taking Celebrex -X-ray of her hands done in January are consistent with an overlap of chondrocalcinosis as well as primary OA -No further workup at this time

## 2017-12-20 NOTE — Assessment & Plan Note (Signed)
-  This problem is chronic and stable -Patient denies any symptoms of hyper or hypothyroidism -We will check TSH on her next visit

## 2017-12-20 NOTE — Patient Instructions (Signed)
-  It was a pleasure seeing you today -Please follow-up with me in 3 months -He has lost  3 pounds since your last visit.  Keep up the great work! -I will refer you to [name], our nutritionist, to help you with your diet -I have stopped her diltiazem and started you on amlodipine for blood pressure -Please follow-up with urology for your urinary retention -Please call me with any questions

## 2017-12-20 NOTE — Assessment & Plan Note (Signed)
-  Pneumonia shot given today (23 valent)

## 2017-12-20 NOTE — Assessment & Plan Note (Signed)
-  Patient followed up with urology (Dr. Gloriann Loan) -We will attempt to obtain notes from urology -Patient does complain of increased urinary frequency but denies dysuria or fevers -Patient states that she was prescribed medication but was unable to afford this and has not been able to get in touch with her urologist to discuss this with him -We will attempt to get in contact with urology office to follow this up -urine dipstick was done today which showed no evidence of an infection - no further workup at this time

## 2017-12-20 NOTE — Assessment & Plan Note (Signed)
-  This problem is chronic and stable -Patient has been trying to lose weight and has lost 3 pounds since her last visit  -I will refer her to [name] for further counseling as far as nutrition goes -We will reassess on her next visit

## 2017-12-20 NOTE — Telephone Encounter (Signed)
Call to [facility] spoke with Reva about medication that was prescribed by Dr. Gloriann Loan.  Patient is unable to afford.  Request to see if Dr. Gloriann Loan could order something else .  Suggestion was to have patient call the [employer] to ask about an alternative.  When asked was told that there may have been a PA request that may have come to the office.  Reva to check on with the nurse.  Requested office visit note as well to be faxed to Dr. Dareen Piano.  Sander Nephew, RN 12/20/2017 9:24 AM.

## 2017-12-20 NOTE — Addendum Note (Signed)
Addended by: Aldine Contes on: 12/20/2017 10:50 AM   Modules accepted: Orders

## 2017-12-20 NOTE — Assessment & Plan Note (Signed)
BP Readings from Last 3 Encounters:  12/20/17 (!) 151/71  11/04/17 (!) 193/94  10/14/17 (!) 152/82    Lab Results  Component Value Date   NA 139 10/14/2017   K 4.0 10/14/2017   CREATININE 0.79 10/14/2017    Assessment: Blood pressure control:  Well-controlled (repeat blood pressure was 128/48) Progress toward BP goal:   At goal Comments: Patient is compliant with diltiazem 180 mg and adequate thiazide 25 mg.  Patient does state that she gets nauseous with diltiazem.  This medication may also interact with her colchicine.  Plan: Medications:  We will continue with adequate thiazide 25 mg and stop her diltiazem.  We will start her on amlodipine 5 mg instead Educational resources provided:   Self management tools provided:   Other plans:

## 2017-12-20 NOTE — Assessment & Plan Note (Signed)
-  This problem is chronic and stable -Patient symptoms are well controlled with loratadine -We will refill this medication for her today

## 2017-12-20 NOTE — Assessment & Plan Note (Signed)
>>  ASSESSMENT AND PLAN FOR OSTEOARTHRITIS OF RIGHT KNEE WRITTEN ON 12/20/2017  9:13 AM BY NARENDRA, NISCHAL, MD  -This problem is chronic and stable -Symptoms are well controlled on current regimen -We will continue with Voltaren gel, colchicine and Tylenol arthritis as needed -No further workup at this time

## 2017-12-20 NOTE — Progress Notes (Signed)
   Subjective:    Patient ID: Christine [name], female    DOB: [birth year], 82 y.o.   MRN: [id number]  HPI I have seen and examined this patient.  Patient is here for routine follow-up of her hypertension and prediabetes.  Patient states that since starting diltiazem she has noted some associated nausea as well as some mild swelling in her legs at the end of the day.  She also complains of increased urinary frequency and has followed up with urology for this.  She has no other complaints at this time and states she is compliant with all her medications.    Review of Systems  Constitutional: Negative.   HENT: Negative.   Respiratory: Negative.   Cardiovascular: Positive for leg swelling.       Patient does complain of some mild lower extremity swelling especially at the end of the day which resolves in the morning  Gastrointestinal: Negative.   Genitourinary: Positive for frequency. Negative for dysuria.  Musculoskeletal: Positive for arthralgias. Negative for joint swelling and myalgias.  Neurological: Negative.   Psychiatric/Behavioral: Negative.        Objective:   Physical Exam  Constitutional: She is oriented to person, place, and time. She appears well-developed and well-nourished.  HENT:  Head: Normocephalic and atraumatic.  Mouth/Throat: No oropharyngeal exudate.  Neck: Neck supple.  Cardiovascular: Normal rate, regular rhythm and normal heart sounds.  Pulmonary/Chest: Effort normal and breath sounds normal. No respiratory distress. She has no wheezes.  Abdominal: Soft. Bowel sounds are normal. She exhibits no distension. There is no tenderness.  Musculoskeletal: Normal range of motion. She exhibits no edema.  Lymphadenopathy:    She has no cervical adenopathy.  Neurological: She is alert and oriented to person, place, and time.  Psychiatric: She has a normal mood and affect. Her behavior is normal.          Assessment & Plan:  Please see problem based charting for  assessment and plan:

## 2017-12-20 NOTE — Assessment & Plan Note (Signed)
-  This problem is chronic and stable -Repeat A1c today was 5.9 -We will monitor this annually

## 2017-12-20 NOTE — Addendum Note (Signed)
Addended by: Sander Nephew F on: 12/20/2017 10:30 AM   Modules accepted: Orders

## 2017-12-20 NOTE — Assessment & Plan Note (Signed)
-  Patient states that her symptoms have improved but she does occasionally feel fatigued -No further workup for now -She has stopped taking her Tessalon capsules as well as her Chloraseptic spray

## 2017-12-21 ENCOUNTER — Telehealth: Payer: Self-pay | Admitting: Internal Medicine

## 2017-12-21 NOTE — Telephone Encounter (Signed)
Rtc, lm for rtc 

## 2017-12-21 NOTE — Telephone Encounter (Signed)
Patient wants to speak to someone about medicine she received

## 2017-12-23 DIAGNOSIS — M6281 Muscle weakness (generalized): Secondary | ICD-10-CM | POA: Diagnosis not present

## 2017-12-23 DIAGNOSIS — N3941 Urge incontinence: Secondary | ICD-10-CM | POA: Diagnosis not present

## 2017-12-23 DIAGNOSIS — M62838 Other muscle spasm: Secondary | ICD-10-CM | POA: Diagnosis not present

## 2017-12-23 DIAGNOSIS — R350 Frequency of micturition: Secondary | ICD-10-CM | POA: Diagnosis not present

## 2017-12-27 NOTE — Telephone Encounter (Signed)
Lm for rtc 

## 2017-12-29 NOTE — Telephone Encounter (Signed)
Lm, closing

## 2018-01-05 DIAGNOSIS — M62838 Other muscle spasm: Secondary | ICD-10-CM | POA: Diagnosis not present

## 2018-01-05 DIAGNOSIS — R350 Frequency of micturition: Secondary | ICD-10-CM | POA: Diagnosis not present

## 2018-01-05 DIAGNOSIS — M6281 Muscle weakness (generalized): Secondary | ICD-10-CM | POA: Diagnosis not present

## 2018-01-05 DIAGNOSIS — N3941 Urge incontinence: Secondary | ICD-10-CM | POA: Diagnosis not present

## 2018-01-26 DIAGNOSIS — R350 Frequency of micturition: Secondary | ICD-10-CM | POA: Diagnosis not present

## 2018-01-26 DIAGNOSIS — N3941 Urge incontinence: Secondary | ICD-10-CM | POA: Diagnosis not present

## 2018-01-26 DIAGNOSIS — M62838 Other muscle spasm: Secondary | ICD-10-CM | POA: Diagnosis not present

## 2018-01-26 DIAGNOSIS — M6281 Muscle weakness (generalized): Secondary | ICD-10-CM | POA: Diagnosis not present

## 2018-01-30 ENCOUNTER — Other Ambulatory Visit: Payer: Self-pay

## 2018-01-30 ENCOUNTER — Ambulatory Visit (INDEPENDENT_AMBULATORY_CARE_PROVIDER_SITE_OTHER): Payer: Medicare Other | Admitting: Dietician

## 2018-01-30 ENCOUNTER — Telehealth: Payer: Self-pay | Admitting: *Deleted

## 2018-01-30 ENCOUNTER — Other Ambulatory Visit: Payer: Self-pay | Admitting: Dietician

## 2018-01-30 ENCOUNTER — Ambulatory Visit (INDEPENDENT_AMBULATORY_CARE_PROVIDER_SITE_OTHER): Payer: Medicare Other | Admitting: Internal Medicine

## 2018-01-30 VITALS — BP 152/70 | HR 90 | Temp 97.4°F | Ht 68.25 in

## 2018-01-30 DIAGNOSIS — M25551 Pain in right hip: Secondary | ICD-10-CM

## 2018-01-30 DIAGNOSIS — R7303 Prediabetes: Secondary | ICD-10-CM

## 2018-01-30 DIAGNOSIS — F339 Major depressive disorder, recurrent, unspecified: Secondary | ICD-10-CM

## 2018-01-30 DIAGNOSIS — Z87891 Personal history of nicotine dependence: Secondary | ICD-10-CM

## 2018-01-30 DIAGNOSIS — M76891 Other specified enthesopathies of right lower limb, excluding foot: Secondary | ICD-10-CM | POA: Diagnosis not present

## 2018-01-30 DIAGNOSIS — Z713 Dietary counseling and surveillance: Secondary | ICD-10-CM

## 2018-01-30 DIAGNOSIS — Z6841 Body Mass Index (BMI) 40.0 and over, adult: Secondary | ICD-10-CM | POA: Diagnosis not present

## 2018-01-30 DIAGNOSIS — M1711 Unilateral primary osteoarthritis, right knee: Secondary | ICD-10-CM | POA: Diagnosis not present

## 2018-01-30 DIAGNOSIS — I10 Essential (primary) hypertension: Secondary | ICD-10-CM

## 2018-01-30 DIAGNOSIS — E041 Nontoxic single thyroid nodule: Secondary | ICD-10-CM | POA: Diagnosis not present

## 2018-01-30 DIAGNOSIS — M11261 Other chondrocalcinosis, right knee: Secondary | ICD-10-CM

## 2018-01-30 DIAGNOSIS — Z79899 Other long term (current) drug therapy: Secondary | ICD-10-CM

## 2018-01-30 HISTORY — DX: Pain in right hip: M25.551

## 2018-01-30 MED ORDER — AMLODIPINE BESYLATE 2.5 MG PO TABS: 2.5000 mg | ORAL_TABLET | Freq: Every day | ORAL | 0 refills | Status: DC

## 2018-01-30 MED ORDER — GABAPENTIN 300 MG PO CAPS: 300.0000 mg | ORAL_CAPSULE | Freq: Every day | ORAL | 0 refills | Status: DC

## 2018-01-30 MED ORDER — SPIRONOLACTONE 25 MG PO TABS: 25.0000 mg | ORAL_TABLET | Freq: Every day | ORAL | 0 refills | Status: DC

## 2018-01-30 NOTE — Progress Notes (Signed)
Referred per patient request to Medicare Diabetes Prevention program at [facility].

## 2018-01-30 NOTE — Assessment & Plan Note (Addendum)
Assessment Thyroid nodule noted on imaging.  Plan - Check TSH today  ADDENDUM: TSH normal. Called patient to inform her of results

## 2018-01-30 NOTE — Telephone Encounter (Signed)
Thank you Christine [name]. I agree with her being seen in [facility] today

## 2018-01-30 NOTE — Assessment & Plan Note (Signed)
Assessment Bedside ultrasound of right hip without evidence of effusion/inflammation of greater trochanteric bursa or lateral femoral cutaneous nerve. Patient is significantly overweight, and most likely etiology for her hip pain is likely meralgia paresthetica vs hip osteoarthritis. Less likely greater trochanteric pain syndrome given normal bedside U/S findings. Will initiate trial with gabapentin, can consider lidocaine injection for lateral femoral cutaneous nerve if no improvement to assist with diagnosis. Long term management is weight loss, which we discussed today and patient is seeing [name].  Plan - Counseled on weight loss - Trial of gabapentin 300mg  [facility] - F/u in 4 weeks

## 2018-01-30 NOTE — Patient Instructions (Addendum)
Healthy crackers to look for to use on peanut butter crackers  [employer] at [employer] brand whole wheat crackers  Diabetes Prevention Program- I will send a referral today.   Try increasing fiber by adding fruit to meals, eating  Sweets- can try using fruits instead Cake- try using high fiber bran muffins   Breakfast- instead of fried chicken  maybe having 1/2- 1 cup high fiber cereal raisin bran with SOY MILK (should not give you gas) . Can still have egg for protein asnd to satisfy your hunger.    Writing down what we eat and drink can help [place] to see it in a different way. It may help [place] to change habits. What we eat and drink are habits formed during our lifetime. Habits are learned and can be unlearned and changed.   Please write down the foods and beverages you have during the day. Please include at least the time you eat and drink, what you eat and drink and how much you eat and drink. Includes as much detail as you can.  For example:  Time   What    How much 8 AM  Cooked oatmeal   1 Cup   Coffee     1 mug                         Sugar in oatmeal   1 teaspoon         Sugar in coffee   2 teaspoons   Milk in oatmeal   1/4 cup   Creamer in coffee    2 teaspoons   Margarine in oatmeal   1 teaspoon

## 2018-01-30 NOTE — Assessment & Plan Note (Signed)
Stable. Saw [name] today. - Counseled on weight loss

## 2018-01-30 NOTE — Progress Notes (Addendum)
   CC: right hip pain  HPI:  [name] is a 82 y.o. female with PMH of depression, pseudogout, osteoarthritis, HTN, and obesity who presents for right hip pain.  BP: Seen in clinic on 3/12 where BP was well-controlled. There was thought of an interaction between diltiazem and colchicine, thus diltiazem was discontinued and amlodipine was started. HCTZ was continued. She does endorse some lower extremity swelling since starting the amlodipine. She reports compliance with her medications. Denies HA, CP, or shortness of breath.  Right hip pain: Approximately 3 weeks ago, she reports gradual onset of right knee pain that started to affect her whole leg. Today, her hip and whole leg are painful. She denies hx of trauma, falls, or increased physical exertion leading up to onset of the pain. She reports the cold makes the pain worse. She has tried tylenol 650mg  once daily with minimal relief, as well as voltaren gel that was initially helpful, however the benefit has subsided. She describes the pain as an aching and burning. States it is worse when she gets out of bed. She does describe some morning stiffness that lasts about 30 minutes before improving. She states that it hurts to walk, but she denies weakness or numbness. Denies back pain, fevers, or unintentional weight loss. Denies having had this pain before. No left sided symptoms.  She had a R knee x-ray in 10/2016 that showed overlap of chondrocalcinosis and osteoarthritis. Right hip x-ray in 2003 showed minimal degenerative SI and enthesopathy, possibly DISH.  Please see the assessment and plan below for the status of the patient's chronic medical problems.  Past Medical History:  Diagnosis Date  . Cataract   . Depression   . Hyperlipemia   . Hypertension   . Impaired glucose tolerance   . Morbidly obese ([place])   . Pruritus   . Seasonal allergies   . Thyroid nodule    Review of Systems:   GEN: Negative for fevers or weight  loss NEURO: Neg for HA CV: Neg for chest pain PULM: Neg for shortness of breath MSK: Positive for right leg pain and BLE edema  Physical Exam:  Vitals:   01/30/18 1504 01/30/18 1544  BP: (!) 172/87 (!) 152/70  Pulse: 95 90  Temp: (!) 97.4 F (36.3 C)   TempSrc: Oral   SpO2: 100% 100%  Height: 5' 8.25" (1.734 m)    GEN: Sitting in chair with right leg raised, appears uncomfortable but NAD CV: NR & RR, no m/r/g PULM: CTAB, no wheezes or rales ABD: Soft, NT, ND, +BS, obese MSK: Trace BLE nonpitting edema. Significant tenderness to palpation overlying right lateral inguinal ligament and lateral thigh. Decreased sensation in right calf and foot compared to left. No decreased sensation over lateral thigh. Normal strength bilaterally. 2+ DP pulses bilaterally.  Right hip bedside ultrasound was performed with supervision of Dr. Heber Bartow. Lateral femoral cutaneous nerve was visualized without evidence of inflammation or obvious compression. No effusion or inflammation evident of greater trochanteric bursa.  Assessment & Plan:   See Encounters Tab for problem based charting.  Patient seen with Dr. Angelia Mould

## 2018-01-30 NOTE — Patient Instructions (Addendum)
FOLLOW-UP INSTRUCTIONS When: 1 month For: follow up of BP and hip pain What to bring: medications   Ms. Pederson,  It was a pleasure to meet you today.  For your thyroid, we are checking some bloodwork. I will let you know the results.  For your blood pressure, we are going to decrease your amlodipine to 2.5mg  once daily. This may be causing some of the leg swelling. We are also going to start a new medicine, called spironolactone. Please take spironolactone 25mg  once daily. Please continue HCTZ.  We are also going to start a medicine to try to help with the hip pain. Like we talked about, the hip pain might be due to several things, either the arthritis in your hip/knee or the nerve that is getting compressed. The best thing to do to help this is to continue working on losing weight. We talked about trying not to over-eat. The new medicine is called gabapentin. You will start 300mg  at night.  Please return to our clinic in about 4 weeks for follow-up.

## 2018-01-30 NOTE — Progress Notes (Signed)
Medical Nutrition Therapy:  Appt start time: 1300 end time:  1400. Visit #   Assessment:  Primary concerns today: pre-diabetes and weight  Ms. Kaupp is here with her daughter for help with her weight and pre-diabetes. " I know what my problem is I eat too many sweets. She has made several changes from her previous meal planning education here during 3 visits in 2015 and another 3 visits in 2017: eats bulgher, frozen bananas, more vegetables, protein at most meals, whole wheat bread, but is frustrated because she has not lost weight. She is interested in attending  the Diabetes Prevention Program and understands it is weekly visits for the first 3- 4 months.  She is limited by lack of physical activity due to osteoarthritis .  Preferred Learning Style: No preference indicated  Learning Readiness: Ready and Change in progress  ANTHROPOMETRICS: weight-308#, BMI-46.3 WEIGHT HISTORY:  Wt Readings from Last 3 Encounters:  12/20/17 (!) 311 lb 12.8 oz (141.4 kg)  11/04/17 (!) 313 lb 9.6 oz (142.2 kg)  10/14/17 (!) 314 lb (142.4 kg)   SLEEP: MEDICATIONS:  Current Outpatient Medications on File Prior to Visit  Medication Sig Dispense Refill  . acetaminophen (TYLENOL 8 HOUR ARTHRITIS PAIN) 650 MG CR tablet Take 1 tablet (650 mg total) by mouth every 8 (eight) hours as needed for pain. 90 tablet 3  . amLODipine (NORVASC) 5 MG tablet Take 1 tablet (5 mg total) by mouth daily. 90 tablet 0  . atorvastatin (LIPITOR) 40 MG tablet Take 1 tablet (40 mg total) by mouth daily. 90 tablet 3  . colchicine 0.6 MG tablet Take 1 tablet (0.6 mg total) by mouth daily. 90 tablet 1  . diclofenac sodium (VOLTAREN) 1 % GEL 3 grams to three large joints up to three times daily 3 Tube 3  . fluocinolone (SYNALAR) 0.01 % external solution Apply topically as needed.    . hydrochlorothiazide (HYDRODIURIL) 25 MG tablet Take 1 tablet (25 mg total) by mouth daily. In the morning. 90 tablet 1  . ketoconazole (NIZORAL) 2 %  shampoo Apply 1 application topically 2 (two) times a week. 120 mL 0  . loratadine (CLARITIN) 10 MG tablet take 1 tablet by mouth once a day if needed for itching 180 tablet 1  . MITIGARE 0.6 MG CAPS Take 0.6 mg by mouth daily. 90 capsule 0   No current facility-administered medications on file prior to visit.     BLOOD SUGAR: Lab Results  Component Value Date   HGBA1C 5.9 12/20/2017    DIETARY INTAKE: Usual eating pattern includes 3 meals and 0 snacks per day. Everyday foods include vegetables, fried foods.  Avoided foods include dislikes cheese.  Food Intolerances: uses cow's milk as a laxative,  Denies Nausea, Vomiting, Diarrhea. Constipation: has problems with this. BM every other day Dining Out (times/week): seldom 24-hr recall:  B (7-9 AM): eggs, fried chicken, white rice, water, 1 c coffee L ( 12-2PM): sweet peas, string beans, tossed salad, tuna or [place] sandwich on whole wheat bread D ( PM): a combination of lunch foods and sometimes eggs Beverages: water, coffee  Usual physical activity: ADLs, very limited by knee/hip pain  Estimated energy needs: 1400-1500 calories   Progress Towards Goal(s):  In progress.   Nutritional Diagnosis:  NI-1.5 Excessive energy intake As related to said intake of sweets and fried foods, large portions.  As evidenced by her reported intake.    Intervention:  Nutrition education and review about importance of decreasing portions, calories,  eating more fiber and increasing her physical activity such as chair exercise or water. Coordination of care: referral made to Medicare Diabetes Prev Prog, there is no class currently avail so she decided to schedule with me and cancel if one becomes available Teaching Method Utilized: Visual,Auditory,Hands on Handouts given during visit include:High fiber foods, tips for weight loss including plate method of meal planning, good fats, bad fats,  Barriers to learning/adherence to lifestyle change:  competing values Demonstrated degree of understanding via:  Teach Back   Monitoring/Evaluation:  Dietary intake, exercise, food records, and body weight in 3 week(s)  [name], RD 01/30/2018 2:43 PM. .

## 2018-01-30 NOTE — Assessment & Plan Note (Signed)
Assessment BP elevated at 172/87, repeat 152/70. Reports compliance with amlodipine and HCTZ, however she thinks that she has had increased swelling while on the amlodipine. Asymptomatic otherwise. Side effects of LE edema for amlodipine are dose-dependent, so will decrease the dose on this and start alternate therapy. She has an allergy to lisinopril - reaction listed as angioedema. She was previously on diltiazem which was thought to be interacting with colchicine. Will thus start spironolactone for ease of dosing.  Plan - Continue HCTZ 25mg  daily - Decrease to amlodipine 2.5mg  daily - Start spironolactone 25mg  daily - F/u in 4 weeks

## 2018-01-30 NOTE — Addendum Note (Signed)
Addended by: Oretha Caprice on: 01/30/2018 05:21 PM   Modules accepted: Level of Service

## 2018-01-30 NOTE — Telephone Encounter (Signed)
Triage called by donnap. To see pt in her office, pt states that her R thigh, knee and lower leg have been bothering her for 3 weeks, no accidents/ injuries, just started causing discomfort and now has pain constantly, becoming more difficult to walk and stand. She has transportation issues sometimes and would like to be seen today since she is here seeing donnap. [facility] 9249 today

## 2018-01-31 LAB — TSH: TSH: 1.05 u[IU]/mL (ref 0.450–4.500)

## 2018-01-31 NOTE — Progress Notes (Signed)
Internal Medicine Clinic Attending  I saw and evaluated the patient.  I personally confirmed the key portions of the history and exam documented by Dr. Huang and I reviewed pertinent patient test results.  The assessment, diagnosis, and plan were formulated together and I agree with the documentation in the resident's note.  

## 2018-02-06 ENCOUNTER — Other Ambulatory Visit: Payer: Self-pay

## 2018-02-06 NOTE — Telephone Encounter (Signed)
rtc to pt, went over medlist and reiterated twice more, pt repeated back

## 2018-02-06 NOTE — Telephone Encounter (Signed)
Requesting to speak with a nurse about amLODipine (NORVASC) 2.5 MG tablet. Please call pt back.

## 2018-02-10 ENCOUNTER — Other Ambulatory Visit: Payer: Self-pay | Admitting: Internal Medicine

## 2018-02-21 ENCOUNTER — Encounter: Payer: Self-pay | Admitting: Dietician

## 2018-02-21 ENCOUNTER — Ambulatory Visit (INDEPENDENT_AMBULATORY_CARE_PROVIDER_SITE_OTHER): Payer: Medicare Other | Admitting: Dietician

## 2018-02-21 DIAGNOSIS — Z6841 Body Mass Index (BMI) 40.0 and over, adult: Secondary | ICD-10-CM

## 2018-02-21 DIAGNOSIS — Z713 Dietary counseling and surveillance: Secondary | ICD-10-CM

## 2018-02-21 DIAGNOSIS — R7303 Prediabetes: Secondary | ICD-10-CM

## 2018-02-21 NOTE — Patient Instructions (Signed)
Keep writing down your food and beverages you have over the next week in the booklet.   Try to find an activity you want to do and try it out in the next week.   Encourage you to try to eat 2 fruit servings a day and 3 vegetable servings and beans a couple times a week.   Have fun at the Diabetes Prevention Program Their phone number is [phone]

## 2018-02-21 NOTE — Progress Notes (Addendum)
Medical Nutrition Therapy:  Appt start time: 1300 end time:  1400. Visit # 2  Assessment:  Primary concerns today: pre-diabetes and weight  Christine [name] is brought food records. They show: 2-3 meals a day and no snacks recorded. Intake appears low, but suspect records are incomplete.  She reports her constipation is improved. Her weight is decreased 0.5# in the past 3 weeks continuing to trend down. She is signed up and planning to attend  the Diabetes Prevention Program and understands it is weekly visits for the first several months.  She is limited by lack of physical activity due to osteoarthritis and agrees to try an activity of her choice( maybe in the water) at the [facility] in the next week   ANTHROPOMETRICS: weight-307.5#, BMI-46.3 WEIGHT HISTORY:  Wt Readings from Last 3 Encounters:  02/21/18 (!) 307 lb 8 oz (139.5 kg)  01/30/18 (!) 308 lb (139.7 kg)  12/20/17 (!) 311 lb 12.8 oz (141.4 kg)   SLEEP: MEDICATIONS:  Current Outpatient Medications on File Prior to Visit  Medication Sig Dispense Refill  . acetaminophen (TYLENOL 8 HOUR ARTHRITIS PAIN) 650 MG CR tablet Take 1 tablet (650 mg total) by mouth every 8 (eight) hours as needed for pain. 90 tablet 3  . amLODipine (NORVASC) 2.5 MG tablet Take 1 tablet (2.5 mg total) by mouth daily. 90 tablet 0  . atorvastatin (LIPITOR) 40 MG tablet Take 1 tablet (40 mg total) by mouth daily. 90 tablet 3  . colchicine 0.6 MG tablet Take 1 tablet (0.6 mg total) by mouth daily. 90 tablet 1  . diclofenac sodium (VOLTAREN) 1 % GEL 3 grams to three large joints up to three times daily 3 Tube 3  . fluocinolone (SYNALAR) 0.01 % external solution Apply topically as needed.    . gabapentin (NEURONTIN) 300 MG capsule Take 1 capsule (300 mg total) by mouth at bedtime. 90 capsule 0  . hydrochlorothiazide (HYDRODIURIL) 25 MG tablet Take 1 tablet (25 mg total) by mouth daily. In the morning. 90 tablet 1  . ketoconazole (NIZORAL) 2 % shampoo Apply 1  application topically 2 (two) times a week. 120 mL 0  . loratadine (CLARITIN) 10 MG tablet take 1 tablet by mouth once a day if needed for itching 180 tablet 1  . spironolactone (ALDACTONE) 25 MG tablet Take 1 tablet (25 mg total) by mouth daily. 90 tablet 0  . VOLTAREN 1 % GEL APPLY 4 G TOPICALLY 4 TIMES DAILY. 300 g 3   No current facility-administered medications on file prior to visit.     BLOOD SUGAR: Lab Results  Component Value Date   HGBA1C 5.9 12/20/2017    DIETARY INTAKE: Usual eating pattern includes 3 meals and 0 snacks per day. Everyday foods include vegetables, fried foods.  Avoided foods include dislikes cheese.  Food Intolerances: uses cow's milk as a laxative,  Denies Nausea, Vomiting, Diarrhea. Constipation: has problems with this. BM every other day Dining Out (times/week): seldom 24-hr recall:  B (7-9 AM): eggs, fried chicken, white rice, water, 1 c coffee L ( 12-2PM): sweet peas, string beans, tossed salad, tuna or [place] sandwich on whole wheat bread D ( PM): a combination of lunch foods and sometimes eggs Beverages: water, coffee  Usual physical activity: ADLs, very limited by knee/hip pain  Estimated energy needs: 1400-1500 calories   Progress Towards Goal(s):  In progress.   Nutritional Diagnosis:  NI-1.5 Excessive energy intake As related to said intake of sweets and fried foods, large  portions.  As evidenced by her reported intake.    Intervention:  Nutrition education and review about importance of decreasing portions, calories, eating more vegetables and fruits and increasing her physical activity such as chair exercise or water. Coordination of care: gave her directions to Diabetes Prev Program Teaching Method Utilized: Visual,Auditory,Hands on Handouts given during visit include:High fiber foods, tips for weight loss including plate method of meal planning, good fats, bad fats,  Barriers to learning/adherence to lifestyle change: competing  values Demonstrated degree of understanding via:  Teach Back   Monitoring/Evaluation:  Dietary intake, exercise, food records, and body weight prn  [name], RD 02/21/2018 1:58 PM. .

## 2018-03-02 ENCOUNTER — Other Ambulatory Visit: Payer: Self-pay

## 2018-03-02 ENCOUNTER — Emergency Department (HOSPITAL_COMMUNITY)
Admission: EM | Admit: 2018-03-02 | Discharge: 2018-03-02 | Disposition: A | Discharge status: Home / Self Care | Payer: Medicare Other | Attending: Emergency Medicine | Admitting: Emergency Medicine

## 2018-03-02 ENCOUNTER — Ambulatory Visit: Payer: Medicare Other | Admitting: Registered"

## 2018-03-02 ENCOUNTER — Encounter (HOSPITAL_COMMUNITY): Payer: Self-pay | Admitting: Emergency Medicine

## 2018-03-02 ENCOUNTER — Emergency Department (HOSPITAL_COMMUNITY): Payer: Medicare Other

## 2018-03-02 DIAGNOSIS — Z87891 Personal history of nicotine dependence: Secondary | ICD-10-CM | POA: Diagnosis not present

## 2018-03-02 DIAGNOSIS — I10 Essential (primary) hypertension: Secondary | ICD-10-CM | POA: Insufficient documentation

## 2018-03-02 DIAGNOSIS — M545 Low back pain: Secondary | ICD-10-CM | POA: Diagnosis not present

## 2018-03-02 DIAGNOSIS — M25551 Pain in right hip: Secondary | ICD-10-CM | POA: Diagnosis not present

## 2018-03-02 DIAGNOSIS — M25561 Pain in right knee: Secondary | ICD-10-CM | POA: Insufficient documentation

## 2018-03-02 DIAGNOSIS — Z79899 Other long term (current) drug therapy: Secondary | ICD-10-CM | POA: Diagnosis not present

## 2018-03-02 MED ORDER — HYDROCODONE-ACETAMINOPHEN 5-325 MG PO TABS
1.0000 | ORAL_TABLET | ORAL | 0 refills | Status: DC | PRN
Start: 2018-03-02 — End: 2018-03-09

## 2018-03-02 MED ORDER — HYDROCODONE-ACETAMINOPHEN 5-325 MG PO TABS
1.0000 | ORAL_TABLET | Freq: Once | ORAL | Status: AC
  Administered 2018-03-02: 1 via ORAL
  Filled 2018-03-02: qty 1
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
## 2018-03-02 NOTE — Discharge Instructions (Signed)
Please rest and apply heat or ice to the affected areas Continue pain medicine prescribed to you: Gabapentin and Voltaren gel Take Norco for severe pain as needed. This medicine can make you drowsy Please make an appointment to follow up with your doctor

## 2018-03-02 NOTE — ED Triage Notes (Signed)
Patient to ED c/o worsening R hip pain that radiates down posterior leg to R knee and sometimes R ankle. Patient states this is a chronic problem but has been worse the last 3-4 days. Patient denies new injury to area and describes the pain as burning. Hx arthritis and takes Tylenol which sometimes works. No calf tenderness or redness noted.

## 2018-03-09 ENCOUNTER — Ambulatory Visit (INDEPENDENT_AMBULATORY_CARE_PROVIDER_SITE_OTHER): Payer: Medicare Other | Admitting: Internal Medicine

## 2018-03-09 ENCOUNTER — Encounter: Payer: Self-pay | Admitting: Internal Medicine

## 2018-03-09 ENCOUNTER — Other Ambulatory Visit: Payer: Self-pay

## 2018-03-09 VITALS — BP 145/68 | HR 95 | Temp 97.4°F | Ht 68.4 in | Wt 302.4 lb

## 2018-03-09 DIAGNOSIS — M1711 Unilateral primary osteoarthritis, right knee: Secondary | ICD-10-CM

## 2018-03-09 DIAGNOSIS — M25551 Pain in right hip: Secondary | ICD-10-CM | POA: Diagnosis present

## 2018-03-09 MED ORDER — CYCLOBENZAPRINE HCL 5 MG PO TABS: 5.0000 mg | ORAL_TABLET | Freq: Three times a day (TID) | ORAL | 1 refills | Status: DC | PRN

## 2018-03-09 NOTE — Progress Notes (Signed)
   CC: Right leg pain  HPI:  [name] is a 82 y.o. female with PMHx detailed below presenting for follow up from an ED visit with right hip and knee pain. She has chronic severe osteoarthritis pain in the right knee but for about 1 week this is much worse than her hip is also severely painful with any activity or pressure. The pain extends from the gluteal area and lateral hip down to the ankle. There is no sensory change noticed only pain. She is finding it hard to raise her leg enough to get into the shower but denies any tripping or falls. She is getting a partial benefit with the oral hydrocodone prescribed at the ED.  See problem based assessment and plan below for additional details.  Hip pain, acute, right At this time I think there is significant lateral hip pain and spasticity that is secondary to her osteoarthritis. There is no pain directed to the groin and ED xrays showed remarkably intact hip joints considering her arthritis in the knees. I do not see any evidence of significant radiculopathy or any neurologic changes. Plan: Can continue the previous oral hydrocodone as needed, no new Rx provided today Flexeril 5mg  q8hrs PRN Recommended starting some basic hip stretches from a supine position, to practice as toelrating  Osteoarthritis of right knee Knee pain appears to be referred both above and below the knee as well as focal joint tenderness. I suspect this arthritis is contributing to a lot of her current symptoms and her lateral hip pain. Voltaren gel is not controlling her symptoms very well. 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 She had only a very transient response to intraarticular injections in the past. Plan: Referral to orthopedic surgery   Past Medical History:  Diagnosis Date  . Cataract   . Depression   . Hyperlipemia   . Hypertension   . Impaired glucose tolerance   .  Morbidly obese ([place])   . Pruritus   . Seasonal allergies   . Thyroid nodule     Review of Systems: Review of Systems  Cardiovascular: Negative for leg swelling.  Musculoskeletal: Positive for joint pain and myalgias. Negative for falls.  Skin: Negative for rash.  Neurological: Negative for sensory change and focal weakness.     Physical Exam: Vitals:   03/09/18 0851  BP: (!) 145/68  Pulse: 95  Temp: (!) 97.4 F (36.3 C)  TempSrc: Oral  SpO2: 100%  Weight: (!) 302 lb 6.4 oz (137.2 kg)  Height: 5' 8.4" (1.737 m)   GENERAL- alert, co-operative, NAD CARDIAC- RRR, no murmurs, rubs or gallops. RESP- CTAB, no wheezes or crackles. BACK- paraspinal muscle tenderness extending laterally on right NEURO- normal reflexes in b/l knees EXTREMITIES- Right lateral hip pain and tenderness to palpation, straight leg raise limited by gluteal/hamstring spasticity, ROM in R knee is restricted with significant crepitus and midline joint tenderness to palpation, no obvious effusion   Assessment & Plan:   See encounters tab for problem based medical decision making.   Patient seen with Dr. Dareen Piano

## 2018-03-09 NOTE — Patient Instructions (Addendum)
We will refer you for an appointment with orthopedic surgery to discuss treatment for your severe right knee arthritis.  I think this knee arthritis is causing a lot of pain in the thigh and lower leg.  There is a lot of muscle spasticity around your right hip.  I prescribed Flexeril 5 mg up to every 8 hours as needed which is a muscle relaxant.  This medicine can cause drowsiness so I recommend first trying it in the evening.

## 2018-03-12 NOTE — Assessment & Plan Note (Signed)
Knee pain appears to be referred both above and below the knee as well as focal joint tenderness. I suspect this arthritis is contributing to a lot of her current symptoms and her lateral hip pain. Voltaren gel is not controlling her symptoms very well. 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 She had only a very transient response to intraarticular injections in the past. Plan: Referral to orthopedic surgery

## 2018-03-12 NOTE — Assessment & Plan Note (Signed)
>>  ASSESSMENT AND PLAN FOR OSTEOARTHRITIS OF RIGHT KNEE WRITTEN ON 03/12/2018  6:49 AM BY RICE, CHRISTOPHER W, MD  Knee pain appears to be referred both above and below the knee as well as focal joint tenderness. I suspect this arthritis is contributing to a lot of her current symptoms and her lateral hip pain. Voltaren gel is not controlling her symptoms very well. 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 She had only a very transient response to intraarticular injections in the past. Plan: Referral to orthopedic surgery

## 2018-03-12 NOTE — Assessment & Plan Note (Signed)
At this time I think there is significant lateral hip pain and spasticity that is secondary to her osteoarthritis. There is no pain directed to the groin and ED xrays showed remarkably intact hip joints considering her arthritis in the knees. I do not see any evidence of significant radiculopathy or any neurologic changes. Plan: Can continue the previous oral hydrocodone as needed, no new Rx provided today Flexeril 5mg  q8hrs PRN Recommended starting some basic hip stretches from a supine position, to practice as toelrating

## 2018-03-14 DIAGNOSIS — N3941 Urge incontinence: Secondary | ICD-10-CM | POA: Diagnosis not present

## 2018-03-14 DIAGNOSIS — R350 Frequency of micturition: Secondary | ICD-10-CM | POA: Diagnosis not present

## 2018-03-17 ENCOUNTER — Encounter: Payer: Self-pay | Admitting: Registered"

## 2018-03-17 ENCOUNTER — Encounter: Payer: Medicare Other | Attending: Internal Medicine | Admitting: Registered"

## 2018-03-17 DIAGNOSIS — Z713 Dietary counseling and surveillance: Secondary | ICD-10-CM | POA: Insufficient documentation

## 2018-03-17 DIAGNOSIS — R7303 Prediabetes: Secondary | ICD-10-CM

## 2018-03-17 NOTE — Progress Notes (Signed)
Medical Nutrition Therapy:  Appt start time: 8315 end time:  1035.  Assessment:  Primary concerns today: Patient has been seen over the last couple of years by [name], RD at [facility]. Pt expressed interested [facility] MD[facility] (prediabetes classes). Pt does not qualify for the [facility] MDPP class due to her Medicaid insurance coverage.   Pt states she wants proceed with 1:1 visit today and will also return for follow-up. Per chart A1c is 5.9%. Pt states she has been prediabetic for 20 yrs. Pt states due to pain in her leg this is the first time she has ben out of the house since the doctor appointment on 3 days ago. Pt states she was not emptying bladder, on medication now and started some exercises to help get a better flow.  Pt states she is supposed to see the surgeon on the 18th because she needs a knee replacement. Pt states her R hip all the way down leg is causing a lot of pain and she is taking muscle relaxant/pain pill which makes her sleepy.  Pt states she goes to bed very early (before 7pm) and wakes up 1 am drinks coffee and does stuff before going back to sleep and gets up ~6:30am feels rested.  For constipation pt states she has several things she does to get bowels moving. Pt reports she may eat ~1/4 cup raisin, 5-6 prunes, drink sweetened milk in coffee or 8-10 oz drink black cherry juice (~45 g cho).  Preferred Learning Style:   No preference indicated   Learning Readiness:   Contemplating  MEDICATIONS: reviewed   DIETARY INTAKE:  Avoided foods include: doesn't like cheese.    24-hr recall:  B ( AM): grits, eggs, bacon, toast OR cereal, milk, 8 oz black cherry juice, coffee vanilla creamer Snk ( AM): banana, PB crackers OR fruit  L ( PM): pinto beans, biscuit, cabbage, collard greens, sometimes brown rice or potatoes Snk ( PM): none D ( PM): hot dog OR hamburger, canned peaches in light juice Snk ( PM): none Beverages: water, occassional diet soda, blk  cherry juice  Usual physical activity: ADLs  Estimated energy needs: 1800 calories  Progress Towards Goal(s):  New goal.   Nutritional Diagnosis:  NI-5.8.4 Inconsistent carbohydrate intake As related to excessive carbohydrate to treat consitpation.  As evidenced by pt reported bowel managment.    Intervention:  Nutrition education for managing blood glucose with diet and lifestyle changes. Described diabetes.  Described the role of different macronutrients on glucose.  Explained how carbohydrates affect blood glucose.  Stated what foods contain the most carbohydrates. Discussed role of exercise in BG control and helping with constipation   Plan: To help with constipation and keep blood sugar under control: Aim to be as activity as you can, consider arm chair exercises Drink more water Consider consider having fewer raisins, prunes and black cherry juice since they are high in sugar. When your are eating fruit or bread, think about having protein with it. Consider using the Myplate method to get balanced meals and smaller portions of carbohydrates  Teaching Method Utilized:  Visual Auditory  Handouts given during visit include:  Arm Chair exercises  MyPlate  Barriers to learning/adherence to lifestyle change: none  Demonstrated degree of understanding via:  Teach Back   Monitoring/Evaluation:  Dietary intake, exercise, and body weight in 4 week(s).

## 2018-03-17 NOTE — Patient Instructions (Addendum)
To help with constipation and keep blood sugar under control: Aim to be as activity as you can, consider arm chair exercises Drink more water Consider consider having fewer raisin, prunes and black cherry juice since they are high in sugar. When your are eating fruit or bread, think about having protein with it. Consider using the Myplate method to get balanced meals and smaller portions of carbohydrates

## 2018-03-20 NOTE — Progress Notes (Addendum)
Internal Medicine Clinic Attending  I saw and evaluated the patient.  I personally confirmed the key portions of the history and exam documented by Dr. Rice and I reviewed pertinent patient test results.  The assessment, diagnosis, and plan were formulated together and I agree with the documentation in the resident's note.  

## 2018-03-22 ENCOUNTER — Telehealth: Payer: Self-pay | Admitting: Dietician

## 2018-03-22 NOTE — Telephone Encounter (Signed)
Patient called very confused about why she could not be in the prediabetes class. I told her I would contact the Nutrition department to help both of [place] understand.

## 2018-03-23 NOTE — Telephone Encounter (Signed)
She can attend Medicare prediabetes program at [place] on June 21st. Left her a message

## 2018-03-24 NOTE — Telephone Encounter (Signed)
Christine [name] called and says she would like to attend the Medicare prediabetes class on Friday June 21. I called [facility] and left a message to register her.

## 2018-03-31 ENCOUNTER — Encounter: Payer: Self-pay | Admitting: Registered"

## 2018-03-31 ENCOUNTER — Ambulatory Visit (HOSPITAL_BASED_OUTPATIENT_CLINIC_OR_DEPARTMENT_OTHER): Payer: Medicare Other | Admitting: Registered"

## 2018-03-31 DIAGNOSIS — R7303 Prediabetes: Secondary | ICD-10-CM | POA: Diagnosis not present

## 2018-03-31 NOTE — Progress Notes (Signed)
Patient was seen on 03/31/18 for the Core Session 1 of Diabetes Prevention Program course at [facility]. The following learning objectives were met by the patient during this class:   Learning Objectives:   Be able to explain the purpose and benefits of the National Diabetes Prevention Program.   Be able to describe the events that will take place at every session.   Know the weight loss and physical activity goals established by the [facility] Diabetes Prevention Program.   Know their own individual weight loss and physical activity goals.   Be able to explain the important effect of self-monitoring on behavior change.   Goals:  Record food and beverage intake in "Food and Activity Tracker" over the next week.  Bring completed "Food and Activity Tracker" for session 1 to session 2 next week. Circle the foods or beverages you think are highest in fat and calories in your food tracker. Read the labels on the food you buy, and consider using measuring cups and spoons to help you calculate the amount you eat. We will talk about measuring in more detail in the coming weeks.   Follow-Up Plan:  Attend Core Session 2 next week.   Bring completed "Food and Activity Tracker" next week to be reviewed by Lifestyle Coach.

## 2018-04-07 ENCOUNTER — Encounter: Payer: Self-pay | Admitting: Registered"

## 2018-04-07 ENCOUNTER — Encounter: Payer: Medicare Other | Attending: Internal Medicine | Admitting: Registered"

## 2018-04-07 DIAGNOSIS — R7303 Prediabetes: Secondary | ICD-10-CM | POA: Diagnosis not present

## 2018-04-07 DIAGNOSIS — Z713 Dietary counseling and surveillance: Secondary | ICD-10-CM | POA: Insufficient documentation

## 2018-04-07 NOTE — Progress Notes (Signed)
Patient was seen on 04/07/18 for the Core Session 2 of Diabetes Prevention Program course at [facility]. By the end of this session patients are able to complete the following objectives:   Learning Objectives:  Self-monitor their weight during the weeks following Session 2.   Describe the relationship between fat and calories.   Explain the reason for, and basic principles of, self-monitoring fat grams and calories.   Identify their personal fat gram goals.   Use the ?Fat and Calorie Counter? to calculate the calories and fat grams of a given selection of foods.   Keep a running total of the fat grams they eat each day.   Calculate fat, calories, and serving sizes from nutrition labels.   Goals:   Weigh yourself at the same time each day, or every few days, and record your weight in your Food and Activity Tracker.  Write down everything you eat and drink in your Food and Activity Tracker.  Measure portions as much as you can, and start reading labels.   Use the ?Fat and Calorie Counter? to figure out the amount of fat and calories in what you ate, and write the amount down in your Food and Activity Tracker.  Keep a running fat gram total throughout the day. Come as close to your fat gram goal as you can.   Follow-Up Plan:  Attend Core Session 3 next week.   Bring completed "Food and Activity Tracker" next week to be reviewed by Lifestyle Coach.

## 2018-04-11 ENCOUNTER — Ambulatory Visit (INDEPENDENT_AMBULATORY_CARE_PROVIDER_SITE_OTHER): Payer: Medicare Other | Admitting: Internal Medicine

## 2018-04-11 ENCOUNTER — Other Ambulatory Visit: Payer: Self-pay

## 2018-04-11 ENCOUNTER — Encounter: Payer: Self-pay | Admitting: Internal Medicine

## 2018-04-11 VITALS — BP 126/69 | HR 102 | Temp 97.6°F | Ht 68.4 in | Wt 295.6 lb

## 2018-04-11 DIAGNOSIS — E785 Hyperlipidemia, unspecified: Secondary | ICD-10-CM

## 2018-04-11 DIAGNOSIS — Z79899 Other long term (current) drug therapy: Secondary | ICD-10-CM

## 2018-04-11 DIAGNOSIS — M19041 Primary osteoarthritis, right hand: Secondary | ICD-10-CM

## 2018-04-11 DIAGNOSIS — M19042 Primary osteoarthritis, left hand: Secondary | ICD-10-CM | POA: Diagnosis not present

## 2018-04-11 DIAGNOSIS — M1711 Unilateral primary osteoarthritis, right knee: Secondary | ICD-10-CM

## 2018-04-11 DIAGNOSIS — I10 Essential (primary) hypertension: Secondary | ICD-10-CM | POA: Diagnosis not present

## 2018-04-11 DIAGNOSIS — B351 Tinea unguium: Secondary | ICD-10-CM | POA: Diagnosis not present

## 2018-04-11 DIAGNOSIS — M1129 Other chondrocalcinosis, multiple sites: Secondary | ICD-10-CM

## 2018-04-11 DIAGNOSIS — R339 Retention of urine, unspecified: Secondary | ICD-10-CM | POA: Diagnosis not present

## 2018-04-11 DIAGNOSIS — Z87891 Personal history of nicotine dependence: Secondary | ICD-10-CM

## 2018-04-11 DIAGNOSIS — R7303 Prediabetes: Secondary | ICD-10-CM | POA: Diagnosis not present

## 2018-04-11 DIAGNOSIS — J309 Allergic rhinitis, unspecified: Secondary | ICD-10-CM

## 2018-04-11 DIAGNOSIS — E7849 Other hyperlipidemia: Secondary | ICD-10-CM

## 2018-04-11 DIAGNOSIS — M25551 Pain in right hip: Secondary | ICD-10-CM

## 2018-04-11 DIAGNOSIS — Z6841 Body Mass Index (BMI) 40.0 and over, adult: Secondary | ICD-10-CM

## 2018-04-11 DIAGNOSIS — M1120 Other chondrocalcinosis, unspecified site: Secondary | ICD-10-CM

## 2018-04-11 MED ORDER — TOLTERODINE TARTRATE ER 4 MG PO CP24: 4.0000 mg | ORAL_CAPSULE | Freq: Every day | ORAL | 0 refills | Status: DC

## 2018-04-11 MED ORDER — HYDROCODONE-ACETAMINOPHEN 5-325 MG PO TABS: 1.0000 | ORAL_TABLET | Freq: Four times a day (QID) | ORAL | 0 refills | Status: DC | PRN

## 2018-04-11 MED ORDER — LORATADINE 10 MG PO TABS: ORAL_TABLET | ORAL | 1 refills | Status: DC

## 2018-04-11 MED ORDER — SPIRONOLACTONE 25 MG PO TABS: 25.0000 mg | ORAL_TABLET | Freq: Every day | ORAL | 0 refills | Status: DC

## 2018-04-11 MED ORDER — AMLODIPINE BESYLATE 2.5 MG PO TABS: 2.5000 mg | ORAL_TABLET | Freq: Every day | ORAL | 0 refills | Status: DC

## 2018-04-11 MED ORDER — CYCLOBENZAPRINE HCL 5 MG PO TABS: 5.0000 mg | ORAL_TABLET | Freq: Three times a day (TID) | ORAL | 1 refills | Status: DC | PRN

## 2018-04-11 MED ORDER — HYDROCHLOROTHIAZIDE 25 MG PO TABS: 25.0000 mg | ORAL_TABLET | Freq: Every day | ORAL | 1 refills | Status: DC

## 2018-04-11 NOTE — Progress Notes (Signed)
   Subjective:    Patient ID: Christine [name], female    DOB: [birth year], 82 y.o.   MRN: [id number]  HPI  I have seen and examined this patient.  Patient is here for routine follow-up of her hypertension and osteoarthritis of her right knee.  Patient states that she has not been able to follow-up with orthopedic doctor as she had severe pain in her knee and was unable to make her appointment.  She states that she will call her orthopedic doctor once she leaves the office to schedule another appointment.  Patient states that her knee pain is better controlled right now.  She has no other complaints at this time and states she has been compliant with all her medications.  Review of Systems  Constitutional: Negative.   HENT: Negative.   Respiratory: Negative.   Cardiovascular: Negative.   Gastrointestinal: Negative.   Musculoskeletal: Positive for arthralgias and gait problem.  Skin: Negative.   Neurological: Negative for dizziness, tremors, speech difficulty, weakness and headaches.  Psychiatric/Behavioral: Negative.        Objective:   Physical Exam  Constitutional: She is oriented to person, place, and time. She appears well-developed and well-nourished.  HENT:  Head: Normocephalic and atraumatic.  Mouth/Throat: No oropharyngeal exudate.  Neck: Neck supple.  Cardiovascular: Normal rate and regular rhythm.  No murmur heard. Pulmonary/Chest: Effort normal and breath sounds normal. She has no wheezes. She has no rales.  Abdominal: Soft. Bowel sounds are normal. She exhibits no distension. There is no tenderness.  Musculoskeletal: She exhibits tenderness. She exhibits no edema.  Patient has tenderness on palpation over her right knee  Lymphadenopathy:    She has no cervical adenopathy.  Neurological: She is alert and oriented to person, place, and time.  Skin: Skin is warm. No rash noted.  Patient noted to have onychomycosis in her toenails bilaterally  Psychiatric: She has a  normal mood and affect. Her behavior is normal.          Assessment & Plan:  Please see problem based charting for assessment and plan:

## 2018-04-11 NOTE — Assessment & Plan Note (Signed)
-  Patient follows up with urology (Dr. Gloriann Loan) as an outpatient for this -She was started on tolterodine extended release 4 mg -She states that her symptoms are much better controlled on this medication -She will follow-up with Dr. Gloriann Loan annually for this -No further work-up at this time

## 2018-04-11 NOTE — Assessment & Plan Note (Signed)
-  Patient noted to have onychomycosis of multiple toenails bilaterally -She would like to see a podiatrist for this -Referral for podiatry given -No further work-up at this time

## 2018-04-11 NOTE — Assessment & Plan Note (Signed)
-  This problem is chronic and stable -We will recheck her A1c on her follow-up visit -Patient is following with nutrition counseling center for prediabetes and will continue to do this -No further work-up at this time

## 2018-04-11 NOTE — Assessment & Plan Note (Signed)
-  Patient has a history of severe away in her right knee -The pain is located both above and below her knee and she also has joint tenderness -Patient missed her appointment orthopedics.  She will likely need a knee replacement.  Patient to call orthopedic doctor today to set up an appointment. -Patient was previously given hydrocodone/acetaminophen 5/325 mg to help control her pain.  She states that this has been helping her ambulate -I will refill a short course of her hydrocodone/acetaminophen (15 tablets/5 days) until she follows with orthopedic doctor -She understands that this is not a long-term medication and is temporary until she gets definitive treatment for her right away -No further work-up at this time

## 2018-04-11 NOTE — Assessment & Plan Note (Signed)
-  This problem is chronic and stable -Patient follows with Dr. Estanislado Pandy for pseudogout as well as primary OA in her hands and knees -She is on colchicine for this which she takes daily -She was still noted to have Celebrex in her medication container but is unsure if she has been taking this or not.  I advised her to not take this and we will dispose of this for her -No further work-up at this time

## 2018-04-11 NOTE — Patient Instructions (Signed)
-  It was a pleasure seeing you today -I have refilled your hydrocodone and Flexeril.  You will need to follow-up with orthopedics for possible knee replacement.  These medications are not long-term medications and are meant to help with your pain control until you see the orthopedic doctor. -Your blood pressure is mildly elevated today.  Continue with your current blood pressure medications.  We will reevaluate this on your next visit -Please call me if you have any questions

## 2018-04-11 NOTE — Assessment & Plan Note (Signed)
>>  ASSESSMENT AND PLAN FOR OSTEOARTHRITIS OF RIGHT KNEE WRITTEN ON 04/11/2018  9:05 AM BY NARENDRA, NISCHAL, MD  -Patient has a history of severe away in her right knee -The pain is located both above and below her knee and she also has joint tenderness -Patient missed her appointment orthopedics.  She will likely need a knee replacement.  Patient to call orthopedic doctor today to set up an appointment. -Patient was previously given hydrocodone/acetaminophen 5/325 mg to help control her pain.  She states that this has been helping her ambulate -I will refill a short course of her hydrocodone/acetaminophen (15 tablets/5 days) until she follows with orthopedic doctor -She understands that this is not a long-term medication and is temporary until she gets definitive treatment for her right away -No further work-up at this time

## 2018-04-11 NOTE — Assessment & Plan Note (Addendum)
-  Patient states that her hip pain is much better -She has been taking Flexeril for this when this acts up -I suspect that her hip pain is secondary to her issues with her right knee causing strain on her muscles around her right hip -We will continue with Flexeril for now -Patient to follow-up with orthopedics for definitive treatment for her right knee pain which I think will alleviate her right hip pain as well

## 2018-04-11 NOTE — Assessment & Plan Note (Signed)
-  This problem is chronic and stable -Patient symptoms are well controlled with loratadine -We will refill this medication today

## 2018-04-11 NOTE — Assessment & Plan Note (Signed)
-  This problem is chronic and stable -Patient's weight is down 7 pounds since her visit in May -She will need to continue to try to lose weight -We will discuss this in detail on her follow-up visit

## 2018-04-11 NOTE — Assessment & Plan Note (Addendum)
BP Readings from Last 3 Encounters:  04/11/18 126/69  03/09/18 (!) 145/68  03/02/18 (!) 172/77    Lab Results  Component Value Date   NA 139 10/14/2017   K 4.0 10/14/2017   CREATININE 0.79 10/14/2017    Assessment: Blood pressure control:   Well-controlled Progress toward BP goal:    At goal Comments: Patient is compliant with amlodipine 2.5 mg, hydrochlorothiazide 25 mg as well as spironolactone 25 mg  Plan: Medications:  continue current medications Educational resources provided:   Self management tools provided:   Other plans: We will check BMP today  Addendum: Patient's creatinine has increased to 1.11 from a baseline of 0.8 6 months ago Advised patient to hold her HCTZ for now and increase her amlodipine to 5 mg. Patient to follow-up in [facility] next week for repeat blood pressure check as well as lab work

## 2018-04-11 NOTE — Assessment & Plan Note (Signed)
-  This problem is chronic and stable -We will recheck a lipid panel today -Continue with atorvastatin for now -No further work-up at this time

## 2018-04-12 ENCOUNTER — Telehealth: Payer: Self-pay | Admitting: *Deleted

## 2018-04-12 LAB — LIPID PANEL
CHOLESTEROL TOTAL: 160 mg/dL (ref 100–199)
Chol/HDL Ratio: 3.5 ratio (ref 0.0–4.4)
HDL: 46 mg/dL (ref >39)
LDL CALC: 97 mg/dL (ref 0–99)
TRIGLYCERIDES: 86 mg/dL (ref 0–149)
VLDL CHOLESTEROL CAL: 17 mg/dL (ref 5–40)

## 2018-04-12 LAB — BMP8+ANION GAP
Anion Gap: 18 mmol/L (ref 10.0–18.0)
BUN / CREAT RATIO: 19 (ref 12–28)
BUN: 21 mg/dL (ref 8–27)
CO2: 24 mmol/L (ref 20–29)
CREATININE: 1.11 mg/dL — AB (ref 0.57–1.00)
Calcium: 9.6 mg/dL (ref 8.7–10.3)
Chloride: 92 mmol/L — ABNORMAL LOW (ref 96–106)
GFR calc Af Amer: 54 mL/min/{1.73_m2} — ABNORMAL LOW (ref >59)
GFR calc non Af Amer: 47 mL/min/{1.73_m2} — ABNORMAL LOW (ref >59)
Glucose: 162 mg/dL — ABNORMAL HIGH (ref 65–99)
Potassium: 4.1 mmol/L (ref 3.5–5.2)
SODIUM: 134 mmol/L (ref 134–144)

## 2018-04-12 NOTE — Telephone Encounter (Addendum)
Information for PA for Cyclobenzaprine was sent to CoverMyMeds.  Awaiting decision.  Sharol Harness 04/12/2018 3:43 PM. Message that request has been approved. No further details.  Sander Nephew, RN 04/12/2018 3:46 PM. Fax from [place] -approval date 01/12/2018 thru 04/12/2019 for Cyclobenzaprine.  Sander Nephew, RN 04/17/2018 10:09 AM

## 2018-04-13 ENCOUNTER — Other Ambulatory Visit: Payer: Self-pay | Admitting: Internal Medicine

## 2018-04-13 DIAGNOSIS — I10 Essential (primary) hypertension: Secondary | ICD-10-CM
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
## 2018-04-17 ENCOUNTER — Ambulatory Visit: Payer: Medicare Other | Admitting: Registered"

## 2018-04-20 ENCOUNTER — Ambulatory Visit (INDEPENDENT_AMBULATORY_CARE_PROVIDER_SITE_OTHER): Payer: Medicare Other | Admitting: Internal Medicine

## 2018-04-20 ENCOUNTER — Encounter: Payer: Self-pay | Admitting: Internal Medicine

## 2018-04-20 VITALS — BP 135/61 | HR 100 | Temp 98.1°F | Ht 68.4 in | Wt 296.5 lb

## 2018-04-20 DIAGNOSIS — I10 Essential (primary) hypertension: Secondary | ICD-10-CM | POA: Diagnosis not present

## 2018-04-20 DIAGNOSIS — M1711 Unilateral primary osteoarthritis, right knee: Secondary | ICD-10-CM

## 2018-04-20 MED ORDER — AMLODIPINE BESYLATE 5 MG PO TABS: 5.0000 mg | ORAL_TABLET | Freq: Every day | ORAL | 11 refills | Status: DC

## 2018-04-20 MED ORDER — HYDROCODONE-ACETAMINOPHEN 5-325 MG PO TABS: 1.0000 | ORAL_TABLET | Freq: Four times a day (QID) | ORAL | 0 refills | Status: DC | PRN

## 2018-04-20 NOTE — Progress Notes (Signed)
Internal Medicine Clinic Attending  I saw and evaluated the patient.  I personally confirmed the key portions of the history and exam documented by Dr. Lee and I reviewed pertinent patient test results.  The assessment, diagnosis, and plan were formulated together and I agree with the documentation in the resident's note.  

## 2018-04-20 NOTE — Progress Notes (Signed)
   CC: Blood pressure check  HPI: [name] is a 82 y.o. F w/ PMH of HTN, Morbid obesity, and osteoarthritis coming to the clinic for blood pressure check. She came to the clinic last week for her regular visit and her blood pressure medicine was changed. She states she is tolerating the medication well but she is running low on her amlodipine. She also complains of continued pain of her right knee. She states the pain is 6/10 radiating up toward her hip. Pain worse with walking, alleviate by rest. She was prescribed a short course of norco last visit. She states she is taking about 2 pills per day and still has 4 pills left. She has an appointment with orthopedics on 04/27/2018 but would like some relief for her pain until she can make the appointment. She states she had some mild constipation but eating 'black cherry juice' and ice cream helped her with her bowel movement. Patient denies any F/N/V/D/Light-headedness/Dizziness/Headaches  Past Medical History:  Diagnosis Date  . Cataract   . Depression   . Hyperlipemia   . Hypertension   . Impaired glucose tolerance   . Morbidly obese ([place])   . Pruritus   . Seasonal allergies   . Thyroid nodule     Review of Systems: Review of Systems  Eyes: Negative for blurred vision, double vision, photophobia and pain.  Respiratory: Negative for cough, shortness of breath and wheezing.   Cardiovascular: Negative for chest pain, palpitations and leg swelling.  Genitourinary: Negative for dysuria, frequency and urgency.  Musculoskeletal: Positive for joint pain.  Neurological: Negative for dizziness, tingling, sensory change, weakness and headaches.     Physical Exam: Vitals:   04/20/18 0829  BP: 135/61  Pulse: 100  Temp: 98.1 F (36.7 C)  TempSrc: Oral  SpO2: 98%  Weight: 296 lb 8 oz (134.5 kg)    Physical Exam  Constitutional: She is oriented to person, place, and time. She appears well-developed and well-nourished. No  distress.  Neck: Normal range of motion. Neck supple. No JVD present.  Cardiovascular: Regular rhythm, normal heart sounds and intact distal pulses. Exam reveals no friction rub.  No murmur heard. Borderline tachycardic pulse ~100  Respiratory: Effort normal and breath sounds normal. No respiratory distress.  GI: Soft. Bowel sounds are normal. There is no tenderness. There is no guarding.  Musculoskeletal:  Right knee with crepitus on extension and bone spur felt on palpation. Tender on movement and palpation. No erythema, warmth or swelling  Lymphadenopathy:    She has no cervical adenopathy.  Neurological: She is alert and oriented to person, place, and time. Coordination normal.    Assessment & Plan:   See Encounters Tab for problem based charting.  Patient seen with Dr. Dareen Piano   -Gilberto Better, PGY1

## 2018-04-20 NOTE — Assessment & Plan Note (Signed)
-   Patient endorse pain of her right knee which is chronic - Worse on exertion, relieved w/ rest - Patient is 296.5lbs w/ BMI of 45 - On physical exam: crepitus on extension and flexion, tender to palpation, radiation up to her hip - States Hydrocodone/acetaminophen therapy has been very helpful in alleviating the pain,  - Has a orthopedic appointment set for 04/27/2018 - Counseled patient on importance of making her orthopedic appointment for evaluation, also discussed that Hydrocodone/acetaminophen will be short term management - C/w current regimen: Flexeril 5mg  TID PRN, Hydrocodone/acetaminophen 5-325mg  q6hr PRN, Tylenol 650mg  q8hr PRN - Order for walker for ambulation

## 2018-04-20 NOTE — Assessment & Plan Note (Addendum)
-   Patient coming for continuing management of her hypertension - BP this visit (7/11) 135/61, last visit (04/11/18) 126/69 - 04/11/18 Creatinine 1.11 (baseline 0.8) - Amlodipine increased to 5mg  and HCTZ discontinued last visit - Tolerating new regimen w/o complaint: Denies lightheadedness, dizziness, palpitations, chest pain, headaches - Repeat BMP today to check creatinine clearance and potassium - C/w current regimen amlodipine 5mg  daily, spironolactone 25mg  daily

## 2018-04-20 NOTE — Assessment & Plan Note (Signed)
>>  ASSESSMENT AND PLAN FOR OSTEOARTHRITIS OF RIGHT KNEE WRITTEN ON 04/20/2018 10:51 AM BY LEE, Artist Pais, MD  - Patient endorse pain of her right knee which is chronic - Worse on exertion, relieved w/ rest - Patient is 296.5lbs w/ BMI of 45 - On physical exam: crepitus on extension and flexion, tender to palpation, radiation up to her hip - States Hydrocodone/acetaminophen therapy has been very helpful in alleviating the pain,  - Has a orthopedic appointment set for 04/27/2018 - Counseled patient on importance of making her orthopedic appointment for evaluation, also discussed that Hydrocodone/acetaminophen will be short term management - C/w current regimen: Flexeril 5mg  TID PRN, Hydrocodone/acetaminophen 5-325mg  q6hr PRN, Tylenol 650mg  q8hr PRN - Order for walker for ambulation

## 2018-04-20 NOTE — Patient Instructions (Signed)
Christine [name]   Thank you for coming into the clinic. Your blood pressure looks great today at 135/61. Please make sure to keep taking your blood pressure medication as prescribed. We took a blood test to see how your kidneys are doing. We will call you and let you know if there are any changes to your blood pressure indicated. Thank you  -Gilberto Better

## 2018-04-21 ENCOUNTER — Encounter: Payer: Medicare Other | Attending: Internal Medicine | Admitting: Registered"

## 2018-04-21 ENCOUNTER — Encounter: Payer: Self-pay | Admitting: Registered"

## 2018-04-21 DIAGNOSIS — R7303 Prediabetes: Secondary | ICD-10-CM | POA: Diagnosis not present

## 2018-04-21 DIAGNOSIS — Z713 Dietary counseling and surveillance: Secondary | ICD-10-CM | POA: Insufficient documentation

## 2018-04-21 LAB — BMP8+ANION GAP
Anion Gap: 16 mmol/L (ref 10.0–18.0)
BUN/Creatinine Ratio: 16 (ref 12–28)
BUN: 14 mg/dL (ref 8–27)
CALCIUM: 10 mg/dL (ref 8.7–10.3)
CO2: 24 mmol/L (ref 20–29)
Chloride: 98 mmol/L (ref 96–106)
Creatinine, Ser: 0.87 mg/dL (ref 0.57–1.00)
GFR, EST AFRICAN AMERICAN: 73 mL/min/{1.73_m2} (ref >59)
GFR, EST NON AFRICAN AMERICAN: 63 mL/min/{1.73_m2} (ref >59)
Glucose: 104 mg/dL — ABNORMAL HIGH (ref 65–99)
POTASSIUM: 4.5 mmol/L (ref 3.5–5.2)
Sodium: 138 mmol/L (ref 134–144)

## 2018-04-21 NOTE — Progress Notes (Signed)
Patient was seen on 04/21/18 for the Core Session 3 of Diabetes Prevention Program course at [facility]. By the end of this session patients are able to complete the following objectives:   Learning Objectives:  Weigh and measure foods.  Estimate the fat and calorie content of common foods.  Describe three ways to eat less fat and fewer calories.  Create a plan to eat less fat for the following week.   Goals:   Track weight when weighing outside of class.   Track food and beverages eaten each day in Food and Activity Tracker and include fat grams and calories for each.   Try to stay within fat gram goal.   Complete plan for eating less high fat foods and answer related homework questions.    Follow-Up Plan:  Attend Core Session 4 next week.   Bring completed "Food and Activity Tracker" next week to be reviewed by Lifestyle Coach.

## 2018-04-22 ENCOUNTER — Other Ambulatory Visit: Payer: Self-pay | Admitting: Internal Medicine

## 2018-04-27 DIAGNOSIS — Z9889 Other specified postprocedural states: Secondary | ICD-10-CM | POA: Diagnosis not present

## 2018-04-27 DIAGNOSIS — M1711 Unilateral primary osteoarthritis, right knee: Secondary | ICD-10-CM | POA: Diagnosis not present

## 2018-04-27 DIAGNOSIS — G8929 Other chronic pain: Secondary | ICD-10-CM | POA: Insufficient documentation

## 2018-04-27 DIAGNOSIS — M25561 Pain in right knee: Secondary | ICD-10-CM | POA: Diagnosis not present

## 2018-04-27 HISTORY — DX: Other chronic pain: G89.29

## 2018-04-28 ENCOUNTER — Encounter: Payer: Medicare Other | Admitting: Registered"

## 2018-04-28 ENCOUNTER — Encounter: Payer: Self-pay | Admitting: Rheumatology

## 2018-04-28 ENCOUNTER — Ambulatory Visit (INDEPENDENT_AMBULATORY_CARE_PROVIDER_SITE_OTHER): Payer: Medicare Other | Admitting: Rheumatology

## 2018-04-28 VITALS — BP 154/84 | HR 105 | Resp 16 | Ht 68.25 in | Wt 295.0 lb

## 2018-04-28 DIAGNOSIS — L409 Psoriasis, unspecified: Secondary | ICD-10-CM

## 2018-04-28 DIAGNOSIS — M19042 Primary osteoarthritis, left hand: Secondary | ICD-10-CM

## 2018-04-28 DIAGNOSIS — M1711 Unilateral primary osteoarthritis, right knee: Secondary | ICD-10-CM

## 2018-04-28 DIAGNOSIS — Z8719 Personal history of other diseases of the digestive system: Secondary | ICD-10-CM

## 2018-04-28 DIAGNOSIS — R7303 Prediabetes: Secondary | ICD-10-CM | POA: Diagnosis not present

## 2018-04-28 DIAGNOSIS — M19041 Primary osteoarthritis, right hand: Secondary | ICD-10-CM | POA: Diagnosis not present

## 2018-04-28 DIAGNOSIS — M1120 Other chondrocalcinosis, unspecified site: Secondary | ICD-10-CM | POA: Diagnosis not present

## 2018-04-28 DIAGNOSIS — Z8639 Personal history of other endocrine, nutritional and metabolic disease: Secondary | ICD-10-CM | POA: Diagnosis not present

## 2018-04-28 DIAGNOSIS — Z8679 Personal history of other diseases of the circulatory system: Secondary | ICD-10-CM

## 2018-04-28 MED ORDER — COLCHICINE 0.6 MG PO TABS: 0.6000 mg | ORAL_TABLET | Freq: Every day | ORAL | 1 refills | Status: DC

## 2018-04-28 MED ORDER — DICLOFENAC SODIUM 1 % TD GEL: TRANSDERMAL | 3 refills | Status: DC

## 2018-04-28 NOTE — Patient Instructions (Addendum)
Knee Exercises Ask your health care provider which exercises are safe for you. Do exercises exactly as told by your health care provider and adjust them as directed. It is normal to feel mild stretching, pulling, tightness, or discomfort as you do these exercises, but you should stop right away if you feel sudden pain or your pain gets worse.Do not begin these exercises until told by your health care provider. STRETCHING AND RANGE OF MOTION EXERCISES These exercises warm up your muscles and joints and improve the movement and flexibility of your knee. These exercises also help to relieve pain, numbness, and tingling. Exercise A: Knee Extension, Prone 1. Lie on your abdomen on a bed. 2. Place your left / right knee just beyond the edge of the surface so your knee is not on the bed. You can put a towel under your left / right thigh just above your knee for comfort. 3. Relax your leg muscles and allow gravity to straighten your knee. You should feel a stretch behind your left / right knee. 4. Hold this position for __________ seconds. 5. Scoot up so your knee is supported between repetitions. Repeat __________ times. Complete this stretch __________ times a day. Exercise B: Knee Flexion, Active  1. Lie on your back with both knees straight. If this causes back discomfort, bend your left / right knee so your foot is flat on the floor. 2. Slowly slide your left / right heel back toward your buttocks until you feel a gentle stretch in the front of your knee or thigh. 3. Hold this position for __________ seconds. 4. Slowly slide your left / right heel back to the starting position. Repeat __________ times. Complete this exercise __________ times a day. Exercise C: Quadriceps, Prone  1. Lie on your abdomen on a firm surface, such as a bed or padded floor. 2. Bend your left / right knee and hold your ankle. If you cannot reach your ankle or pant leg, loop a belt around your foot and grab the belt  instead. 3. Gently pull your heel toward your buttocks. Your knee should not slide out to the side. You should feel a stretch in the front of your thigh and knee. 4. Hold this position for __________ seconds. Repeat __________ times. Complete this stretch __________ times a day. Exercise D: Hamstring, Supine 1. Lie on your back. 2. Loop a belt or towel over the ball of your left / right foot. The ball of your foot is on the walking surface, right under your toes. 3. Straighten your left / right knee and slowly pull on the belt to raise your leg until you feel a gentle stretch behind your knee. ? Do not let your left / right knee bend while you do this. ? Keep your other leg flat on the floor. 4. Hold this position for __________ seconds. Repeat __________ times. Complete this stretch __________ times a day. STRENGTHENING EXERCISES These exercises build strength and endurance in your knee. Endurance is the ability to use your muscles for a long time, even after they get tired. Exercise E: Quadriceps, Isometric  1. Lie on your back with your left / right leg extended and your other knee bent. Put a rolled towel or small pillow under your knee if told by your health care provider. 2. Slowly tense the muscles in the front of your left / right thigh. You should see your kneecap slide up toward your hip or see increased dimpling just above the knee. This   motion will push the back of the knee toward the floor. 3. For __________ seconds, keep the muscle as tight as you can without increasing your pain. 4. Relax the muscles slowly and completely. Repeat __________ times. Complete this exercise __________ times a day. Exercise F: Straight Leg Raises - Quadriceps 1. Lie on your back with your left / right leg extended and your other knee bent. 2. Tense the muscles in the front of your left / right thigh. You should see your kneecap slide up or see increased dimpling just above the knee. Your thigh may  even shake a bit. 3. Keep these muscles tight as you raise your leg 4-6 inches (10-15 cm) off the floor. Do not let your knee bend. 4. Hold this position for __________ seconds. 5. Keep these muscles tense as you lower your leg. 6. Relax your muscles slowly and completely after each repetition. Repeat __________ times. Complete this exercise __________ times a day. Exercise G: Hamstring, Isometric 1. Lie on your back on a firm surface. 2. Bend your left / right knee approximately __________ degrees. 3. Dig your left / right heel into the surface as if you are trying to pull it toward your buttocks. Tighten the muscles in the back of your thighs to dig as hard as you can without increasing any pain. 4. Hold this position for __________ seconds. 5. Release the tension gradually and allow your muscles to relax completely for __________ seconds after each repetition. Repeat __________ times. Complete this exercise __________ times a day. Exercise H: Hamstring Curls  If told by your health care provider, do this exercise while wearing ankle weights. Begin with __________ weights. Then increase the weight by 1 lb (0.5 kg) increments. Do not wear ankle weights that are more than __________. 1. Lie on your abdomen with your legs straight. 2. Bend your left / right knee as far as you can without feeling pain. Keep your hips flat against the floor. 3. Hold this position for __________ seconds. 4. Slowly lower your leg to the starting position.  Repeat __________ times. Complete this exercise __________ times a day. Exercise I: Squats (Quadriceps) 1. Stand in front of a table, with your feet and knees pointing straight ahead. You may rest your hands on the table for balance but not for support. 2. Slowly bend your knees and lower your hips like you are going to sit in a chair. ? Keep your weight over your heels, not over your toes. ? Keep your lower legs upright so they are parallel with the table  legs. ? Do not let your hips go lower than your knees. ? Do not bend lower than told by your health care provider. ? If your knee pain increases, do not bend as low. 3. Hold the squat position for __________ seconds. 4. Slowly push with your legs to return to standing. Do not use your hands to pull yourself to standing. Repeat __________ times. Complete this exercise __________ times a day. Exercise J: Wall Slides (Quadriceps)  1. Lean your back against a smooth wall or door while you walk your feet out 18-24 inches (46-61 cm) from it. 2. Place your feet hip-width apart. 3. Slowly slide down the wall or door until your knees bend __________ degrees. Keep your knees over your heels, not over your toes. Keep your knees in line with your hips. 4. Hold for __________ seconds. Repeat __________ times. Complete this exercise __________ times a day. Exercise K: Straight Leg Raises -   Hip Abductors 1. Lie on your side with your left / right leg in the top position. Lie so your head, shoulder, knee, and hip line up. You may bend your bottom knee to help you keep your balance. 2. Roll your hips slightly forward so your hips are stacked directly over each other and your left / right knee is facing forward. 3. Leading with your heel, lift your top leg 4-6 inches (10-15 cm). You should feel the muscles in your outer hip lifting. ? Do not let your foot drift forward. ? Do not let your knee roll toward the ceiling. 4. Hold this position for __________ seconds. 5. Slowly return your leg to the starting position. 6. Let your muscles relax completely after each repetition. Repeat __________ times. Complete this exercise __________ times a day. Exercise L: Straight Leg Raises - Hip Extensors 1. Lie on your abdomen on a firm surface. You can put a pillow under your hips if that is more comfortable. 2. Tense the muscles in your buttocks and lift your left / right leg about 4-6 inches (10-15 cm). Keep your knee  straight as you lift your leg. 3. Hold this position for __________ seconds. 4. Slowly lower your leg to the starting position. 5. Let your leg relax completely after each repetition. Repeat __________ times. Complete this exercise __________ times a day. This information is not intended to replace advice given to you by your health care provider. Make sure you discuss any questions you have with your health care provider. Document Released: 08/11/2005 Document Revised: 06/21/2016 Document Reviewed: 08/03/2015 Elsevier Interactive Patient Education  2018 [employer]  Iliotibial Band Syndrome Rehab Ask your health care provider which exercises are safe for you. Do exercises exactly as told by your health care provider and adjust them as directed. It is normal to feel mild stretching, pulling, tightness, or discomfort as you do these exercises, but you should stop right away if you feel sudden pain or your pain gets worse.Do not begin these exercises until told by your health care provider. Stretching and range of motion exercises These exercises warm up your muscles and joints and improve the movement and flexibility of your hip and pelvis. Exercise A: Quadriceps, prone  1. Lie on your abdomen on a firm surface, such as a bed or padded floor. 2. Bend your left / right knee and hold your ankle. If you cannot reach your ankle or pant leg, loop a belt around your foot and grab the belt instead. 3. Gently pull your heel toward your buttocks. Your knee should not slide out to the side. You should feel a stretch in the front of your thigh and knee. 4. Hold this position for __________ seconds. Repeat __________ times. Complete this stretch __________ times a day. Exercise B: Iliotibial band  1. Lie on your side with your left / right leg in the top position. 2. Bend both of your knees and grab your left / right ankle. Stretch out your bottom arm to help you balance. 3. Slowly bring your top knee  back so your thigh goes behind your trunk. 4. Slowly lower your top leg toward the floor until you feel a gentle stretch on the outside of your left / right hip and thigh. If you do not feel a stretch and your knee will not fall farther, place the heel of your other foot on top of your knee and pull your knee down toward the floor with your foot. 5. Hold this   position for __________ seconds. Repeat __________ times. Complete this stretch __________ times a day. Strengthening exercises These exercises build strength and endurance in your hip and pelvis. Endurance is the ability to use your muscles for a long time, even after they get tired. Exercise C: Straight leg raises ( hip abductors) 1. Lie on your side with your left / right leg in the top position. Lie so your head, shoulder, knee, and hip line up. You may bend your bottom knee to help you balance. 2. Roll your hips slightly forward so your hips are stacked directly over each other and your left / right knee is facing forward. 3. Tense the muscles in your outer thigh and lift your top leg 4-6 inches (10-15 cm). 4. Hold this position for __________ seconds. 5. Slowly return to the starting position. Let your muscles relax completely before doing another repetition. Repeat __________ times. Complete this exercise __________ times a day. Exercise D: Straight leg raises ( hip extensors) 1. Lie on your abdomen on your bed or a firm surface. You can put a pillow under your hips if that is more comfortable. 2. Bend your left / right knee so your foot is straight up in the air. 3. Squeeze your buttock muscles and lift your left / right thigh off the bed. Do not let your back arch. 4. Tense this muscle as hard as you can without increasing any knee pain. 5. Hold this position for __________ seconds. 6. Slowly lower your leg to the starting position and allow it to relax completely. Repeat __________ times. Complete this exercise __________ times a  day. Exercise E: Hip hike 1. Stand sideways on a bottom step. Stand on your left / right leg with your other foot unsupported next to the step. You can hold onto the railing or wall if needed for balance. 2. Keep your knees straight and your torso square. Then, lift your left / right hip up toward the ceiling. 3. Slowly let your left / right hip lower toward the floor, past the starting position. Your foot should get closer to the floor. Do not lean or bend your knees. Repeat __________ times. Complete this exercise __________ times a day. This information is not intended to replace advice given to you by your health care provider. Make sure you discuss any questions you have with your health care provider. Document Released: 09/27/2005 Document Revised: 06/01/2016 Document Reviewed: 08/29/2015 Elsevier Interactive Patient Education  [employer].

## 2018-05-05 ENCOUNTER — Encounter (HOSPITAL_BASED_OUTPATIENT_CLINIC_OR_DEPARTMENT_OTHER): Payer: Medicare Other | Admitting: Registered"

## 2018-05-05 ENCOUNTER — Encounter: Payer: Self-pay | Admitting: Registered"

## 2018-05-05 DIAGNOSIS — R7303 Prediabetes: Secondary | ICD-10-CM

## 2018-05-05 NOTE — Progress Notes (Signed)
Patient was seen on 05/05/18 for the Core Session 5 of Diabetes Prevention Program course at [facility]. By the end of this session patients are able to complete the following objectives:   Learning Objectives:  Establish a physical activity goal.  Explain the importance of the physical activity goal.  Describe their current level of physical activity.  Name ways that they are already physically active.  Develop personal plans for physical activity for the next week.   Goals:   Record weight taken outside of class.   Track foods and beverages eaten each day in the "Food and Activity Tracker," including calories and fat grams for each item.   Make an Activity Plan including date, specific type of activity, and length of time you plan to be active that includes at last 60 minutes of activity for the week.   Track activity type, minutes you were active, and distance you reached each day in the "Food and Activity Tracker."   Follow-Up Plan:  Attend Core Session 6 next week.   Bring completed "Food and Activity Tracker" next week to be reviewed by Lifestyle Coach.

## 2018-05-10 ENCOUNTER — Ambulatory Visit (INDEPENDENT_AMBULATORY_CARE_PROVIDER_SITE_OTHER): Payer: Medicare Other | Admitting: Podiatry

## 2018-05-10 ENCOUNTER — Encounter: Payer: Self-pay | Admitting: Podiatry

## 2018-05-10 VITALS — BP 142/93 | HR 102

## 2018-05-10 DIAGNOSIS — B351 Tinea unguium: Secondary | ICD-10-CM

## 2018-05-10 DIAGNOSIS — M79676 Pain in unspecified toe(s): Secondary | ICD-10-CM | POA: Diagnosis not present

## 2018-05-12 ENCOUNTER — Encounter: Payer: Medicare Other | Attending: Internal Medicine | Admitting: Registered"

## 2018-05-12 ENCOUNTER — Encounter: Payer: Self-pay | Admitting: Registered"

## 2018-05-12 DIAGNOSIS — R7303 Prediabetes: Secondary | ICD-10-CM | POA: Diagnosis not present

## 2018-05-12 DIAGNOSIS — Z713 Dietary counseling and surveillance: Secondary | ICD-10-CM | POA: Insufficient documentation

## 2018-05-12 NOTE — Progress Notes (Signed)
Patient was seen on 05/12/18 for the Core Session 6 of Diabetes Prevention Program course at [facility]. By the end of this session patients are able to complete the following objectives:   Learning Objectives:  Graph their daily physical activity.   Describe two ways of finding the time to be active.   Define "lifestyle activity."   Describe how to prevent injury.   Develop an activity plan for the coming week.   Goals:   Record weight taken outside of class.   Track foods and beverages eaten each day in the "Food and Activity Tracker," including calories and fat grams for each item.    Track activity type, minutes you were active, and distance you reached each day in the "Food and Activity Tracker."   Set aside one 20 to 30-minute block of time every day or find two or more periods of 10 to15 minutes each for physical activity.   Warm up, cool down, and stretch.  Make a Physical Activities Plan for the Week.   Follow-Up Plan:  Attend Core Session 7 next week.   Bring completed "Food and Activity Tracker" next week to be reviewed by Lifestyle Coach.

## 2018-05-16 NOTE — Progress Notes (Signed)
   SUBJECTIVE Patient presents to office today complaining of elongated, thickened nails that cause pain while ambulating in shoes. She is unable to trim her own nails. Patient is here for further evaluation and treatment.  Past Medical History:  Diagnosis Date  . Cataract   . Depression   . Hyperlipemia   . Hypertension   . Impaired glucose tolerance   . Morbidly obese ([place])   . Pruritus   . Seasonal allergies   . Thyroid nodule     OBJECTIVE General Patient is awake, alert, and oriented x 3 and in no acute distress. Derm Skin is dry and supple bilateral. Negative open lesions or macerations. Remaining integument unremarkable. Nails are tender, long, thickened and dystrophic with subungual debris, consistent with onychomycosis, 1-5 bilateral. No signs of infection noted. Vasc  DP and PT pedal pulses palpable bilaterally. Temperature gradient within normal limits.  Neuro Epicritic and protective threshold sensation grossly intact bilaterally.  Musculoskeletal Exam No symptomatic pedal deformities noted bilateral. Muscular strength within normal limits.  ASSESSMENT 1. Onychodystrophic nails 1-5 bilateral with hyperkeratosis of nails.  2. Onychomycosis of nail due to dermatophyte bilateral 3. Pain in foot bilateral  PLAN OF CARE 1. Patient evaluated today.  2. Instructed to maintain good pedal hygiene and foot care.  3. Mechanical debridement of nails 1-5 bilaterally performed using a nail nipper. Filed with dremel without incident.  4. Return to clinic in 3 mos.    Edrick Kins, DPM [facility]  Dr. Edrick Kins, [address]                                        [place], [place] [id number]                Office [phone]  Fax [phone]

## 2018-05-19 ENCOUNTER — Encounter (HOSPITAL_BASED_OUTPATIENT_CLINIC_OR_DEPARTMENT_OTHER): Payer: Medicare Other | Admitting: Registered"

## 2018-05-19 ENCOUNTER — Encounter: Payer: Self-pay | Admitting: Registered"

## 2018-05-19 DIAGNOSIS — R7303 Prediabetes: Secondary | ICD-10-CM

## 2018-05-19 NOTE — Progress Notes (Signed)
Patient was seen on 05/19/18 for the Core Session 7 of Diabetes Prevention Program course at [facility]. By the end of this session patients are able to complete the following objectives:   Learning Objectives:  Define calorie balance.  Explain how healthy eating and being active are related in terms of calorie balance.   Describe the relationship between calorie balance and weight loss.   Describe his or her progress as it relates to calorie balance.   Develop an activity plan for the coming week.   Goals:   Record weight taken outside of class.   Track foods and beverages eaten each day in the "Food and Activity Tracker," including calories and fat grams for each item.    Track activity type, minutes you were active, and distance you reached each day in the "Food and Activity Tracker."   Set aside one 20 to 30-minute block of time every day or find two or more periods of 10 to15 minutes each for physical activity.   Make a Physical Activities Plan for the Week.   Make active lifestyle choices all through the day   Stay at or go slightly over activity goal.   Follow-Up Plan:  Attend Core Session 8 next week.   Bring completed "Food and Activity Tracker" next week to be reviewed by Lifestyle Coach.

## 2018-05-22 ENCOUNTER — Other Ambulatory Visit: Payer: Self-pay | Admitting: Internal Medicine

## 2018-05-22 DIAGNOSIS — M25551 Pain in right hip: Secondary | ICD-10-CM

## 2018-05-24 ENCOUNTER — Telehealth: Payer: Self-pay | Admitting: Rheumatology

## 2018-05-24 NOTE — Telephone Encounter (Signed)
Transfer call from [place] main office. Patient left message 8/13 requesting a call back. Patient has lost the rx she got from Dr. Estanislado Pandy for a knee brace, and would like to get a new one. Please call patient to advise.

## 2018-05-24 NOTE — Telephone Encounter (Signed)
Attempted to call patient and left message on machine to advise patient a new prescription for a knee brace is at the front desk ready for pick up.

## 2018-05-26 ENCOUNTER — Encounter (HOSPITAL_BASED_OUTPATIENT_CLINIC_OR_DEPARTMENT_OTHER): Payer: Medicare Other | Admitting: Registered"

## 2018-05-26 ENCOUNTER — Encounter: Payer: Self-pay | Admitting: Registered"

## 2018-05-26 DIAGNOSIS — R7303 Prediabetes: Secondary | ICD-10-CM

## 2018-05-26 NOTE — Progress Notes (Signed)
Patient was seen on 05/26/18 for the Core Session 8 of Diabetes Prevention Program course at [facility]. By the end of this session patients are able to complete the following objectives:   Learning Objectives:  Recognize positive and negative food and activity cues.   Change negative food and activity cues to positive cues.   Add positive cues for activity and eliminate cues for inactivity.   Develop a plan for removing one problem food cue for the coming week.   Goals:   Record weight taken outside of class.   Track foods and beverages eaten each day in the "Food and Activity Tracker," including calories and fat grams for each item.    Track activity type, minutes you were active, and distance you reached each day in the "Food and Activity Tracker."   Set aside one 20 to 30-minute block of time every day or find two or more periods of 10 to15 minutes each for physical activity.   Remove one problem food cue.   Add one positive cue for being more active.  Follow-Up Plan:  Attend Core Session 9 next week.   Bring completed "Food and Activity Tracker" next week to be reviewed by Lifestyle Coach.

## 2018-06-02 ENCOUNTER — Encounter: Payer: Medicare Other | Admitting: Registered"

## 2018-06-02 ENCOUNTER — Other Ambulatory Visit: Payer: Self-pay | Admitting: *Deleted

## 2018-06-02 DIAGNOSIS — M1711 Unilateral primary osteoarthritis, right knee: Secondary | ICD-10-CM

## 2018-06-02 MED ORDER — HYDROCODONE-ACETAMINOPHEN 5-325 MG PO TABS: 1.0000 | ORAL_TABLET | Freq: Four times a day (QID) | ORAL | 0 refills | Status: DC | PRN

## 2018-06-02 NOTE — Telephone Encounter (Signed)
Patient called in [facility] [name] stating she has so much right knee pain she cannot walk to bathroom. Is supposed to have knee surgery with Dr. Ronnie Derby but has been told she needs to lose weight first. Pain starts in right knee and extends up and down entire leg. Requesting refill on pain med. Hubbard Hartshorn, RN, BSN

## 2018-06-02 NOTE — Telephone Encounter (Signed)
Message left on patient's self-identified VM that rx has been sent. Hubbard Hartshorn, RN, BSN

## 2018-06-09 ENCOUNTER — Encounter: Payer: Self-pay | Admitting: Registered"

## 2018-06-09 ENCOUNTER — Ambulatory Visit (HOSPITAL_BASED_OUTPATIENT_CLINIC_OR_DEPARTMENT_OTHER): Payer: Medicare Other | Admitting: Registered"

## 2018-06-09 DIAGNOSIS — R7303 Prediabetes: Secondary | ICD-10-CM

## 2018-06-09 NOTE — Progress Notes (Signed)
Patient was seen on 06/09/18 for the Core Session 10 of Diabetes Prevention Program course at [facility]. By the end of this session patients are able to complete the following objectives:   Learning Objectives:  List and describe the four keys for healthy eating out.   Give examples of how to apply these keys at the type of restaurants that the participants go to regularly.   Make an appropriate meal selection from a restaurant menu.   Demonstrate how to ask for a substitute item using assertive language and a polite tone of voice.    Goals:   Record weight taken outside of class.   Track foods and beverages eaten each day in the "Food and Activity Tracker," including calories and fat grams for each item.    Track activity type, minutes you were active, and distance you reached each day in the "Food and Activity Tracker."   Set aside one 20 to 30-minute block of time every day or find two or more periods of 10 to15 minutes each for physical activity.   Utilize positive action plan and complete questions on "To Do List."   Follow-Up Plan:  Attend Core Session 11 next week.   Bring completed "Food and Activity Tracker" next week to be reviewed by Lifestyle Coach.

## 2018-06-15 ENCOUNTER — Other Ambulatory Visit: Payer: Self-pay | Admitting: Internal Medicine

## 2018-06-15 DIAGNOSIS — M25551 Pain in right hip: Secondary | ICD-10-CM

## 2018-06-15 DIAGNOSIS — E7849 Other hyperlipidemia: Secondary | ICD-10-CM

## 2018-06-15 DIAGNOSIS — I10 Essential (primary) hypertension: Secondary | ICD-10-CM

## 2018-06-16 ENCOUNTER — Encounter: Payer: Medicare Other | Admitting: Registered"

## 2018-06-29 ENCOUNTER — Other Ambulatory Visit: Payer: Self-pay | Admitting: Internal Medicine

## 2018-06-29 DIAGNOSIS — M25551 Pain in right hip: Secondary | ICD-10-CM

## 2018-06-30 ENCOUNTER — Encounter: Payer: Medicare Other | Admitting: Registered"

## 2018-07-07 ENCOUNTER — Encounter: Payer: Self-pay | Admitting: Registered"

## 2018-07-07 ENCOUNTER — Encounter: Payer: Medicare Other | Attending: Internal Medicine | Admitting: Registered"

## 2018-07-07 DIAGNOSIS — Z713 Dietary counseling and surveillance: Secondary | ICD-10-CM | POA: Insufficient documentation

## 2018-07-07 DIAGNOSIS — R7303 Prediabetes: Secondary | ICD-10-CM | POA: Diagnosis not present

## 2018-07-07 NOTE — Progress Notes (Signed)
Patient was seen on 07/07/18 for the Core Session 12 of Diabetes Prevention Program course at [facility]. By the end of this session patients are able to complete the following objectives:   Learning Objectives:  Describe their current progress toward defined goals.  Describe common causes for slipping from healthy eating or being active.  Explain what to do to get back on their feet after a slip.  Goals:   Record weight taken outside of class.   Track foods and beverages eaten each day in the "Food and Activity Tracker," including calories and fat grams for each item.    Track activity type, minutes active, and distance reached each day in the "Food and Activity Tracker."   Try out the two action plans created during session- "Slips from Healthy Eating: Action Plan" and "Slips from Being Active: Action Plan"  Answer questions on the handout.   Follow-Up Plan:  Attend Core Session 14 next week.   Bring completed "Food and Activity Tracker" next week to be reviewed by Lifestyle Coach.

## 2018-07-14 ENCOUNTER — Encounter: Payer: Self-pay | Admitting: Registered"

## 2018-07-14 ENCOUNTER — Encounter: Payer: Medicare Other | Attending: Internal Medicine | Admitting: Registered"

## 2018-07-14 DIAGNOSIS — R7303 Prediabetes: Secondary | ICD-10-CM | POA: Diagnosis not present

## 2018-07-14 DIAGNOSIS — Z713 Dietary counseling and surveillance: Secondary | ICD-10-CM | POA: Insufficient documentation

## 2018-07-14 NOTE — Progress Notes (Signed)
Patient was seen on 07/14/18 for the Core Session 14 of Diabetes Prevention Program course at [facility]. By the end of this session patients are able to complete the following objectives:   Learning Objectives:  Give examples of problem social cues and helpful social cues.   Explain how to remove problem social cues and add helpful ones.   Describe ways of coping with vacations and social events such as parties, holidays, and visits from relatives and friends.   Create an action plan to change a problem social cue and add a helpful one.   Goals:   Record weight taken outside of class.   Track foods and beverages eaten each day in the "Food and Activity Tracker," including calories and fat grams for each item.    Track activity type, minutes you were active, and distance you reached each day in the "Food and Activity Tracker."   Do your best to reach activity goal for the week.  Use action plan created during session to change a problem social cue and add a helpful social cue.   Answer questions regarding success of changing social cues on "To Do Next Week" handout.   Follow-Up Plan:  Attend Core Session 15 next week.   Bring completed "Food and Activity Tracker" next week to be reviewed by Lifestyle Coach.

## 2018-07-21 ENCOUNTER — Encounter: Payer: Medicare Other | Admitting: Registered"

## 2018-08-04 ENCOUNTER — Encounter: Payer: Self-pay | Admitting: Registered"

## 2018-08-04 ENCOUNTER — Ambulatory Visit (HOSPITAL_BASED_OUTPATIENT_CLINIC_OR_DEPARTMENT_OTHER): Payer: Medicare Other | Admitting: Registered"

## 2018-08-04 DIAGNOSIS — R7303 Prediabetes: Secondary | ICD-10-CM | POA: Diagnosis not present

## 2018-08-04 NOTE — Progress Notes (Signed)
Patient was seen on 08/04/18 for the Core Session 16 of Diabetes Prevention Program course at [facility]. By the end of this session patients are able to complete the following objectives:   Learning Objectives:  Measure their progress toward weight and physical activity goals since Session 1.   Develop a plan for improving progress, if their goals have not yet been attained.   Describe ways to stay motivated long-term.   Goals:   Record weight taken outside of class.   Track foods and beverages eaten each day in the "Food and Activity Tracker," including calories and fat grams for each item.    Track activity type, minutes you were active, and distance you reached each day in the "Food and Activity Tracker."   Utilize action plan to help stay motivated and complete questions on "To Do List."   Follow-Up Plan:  Attend session 17 next week.   Bring completed "Food and Activity Tracker" next session to be reviewed by Lifestyle Coach.

## 2018-08-09 ENCOUNTER — Other Ambulatory Visit: Payer: Self-pay | Admitting: Internal Medicine

## 2018-08-09 DIAGNOSIS — M25551 Pain in right hip: Secondary | ICD-10-CM

## 2018-08-09 NOTE — Telephone Encounter (Signed)
Pt appt 10/17/2017 @ 8:45am.

## 2018-08-11 ENCOUNTER — Encounter: Payer: Medicare Other | Admitting: Registered"

## 2018-08-17 ENCOUNTER — Telehealth: Payer: Self-pay | Admitting: *Deleted

## 2018-08-17 NOTE — Telephone Encounter (Signed)
Prior Authorization submitted via cover my meds for Voltaren Gel. Will update once response received.  

## 2018-08-18 ENCOUNTER — Encounter: Payer: Medicare Other | Admitting: Registered"

## 2018-09-01 ENCOUNTER — Encounter: Payer: Self-pay | Admitting: Registered"

## 2018-09-01 ENCOUNTER — Encounter: Payer: Medicare Other | Attending: Internal Medicine | Admitting: Registered"

## 2018-09-01 DIAGNOSIS — Z713 Dietary counseling and surveillance: Secondary | ICD-10-CM | POA: Insufficient documentation

## 2018-09-01 DIAGNOSIS — R7303 Prediabetes: Secondary | ICD-10-CM | POA: Insufficient documentation

## 2018-09-01 NOTE — Progress Notes (Signed)
Patient was seen on 09/01/18 for Session 18 of Diabetes Prevention Program course at [facility]. By the end of this session patients are able to complete the following objectives:   Learning Objectives:  Explain how glucose is used in the body and it's relationship with insulin/insulin resistance.   Identify symptoms of diabetes.   Describe lab tests used to diagnose diabetes.   Describe health complications and conditions related to diabetes.   Goals:   Record weight taken outside of class.   Track foods and beverages eaten each day in the "Food and Activity Tracker," including calories and fat grams for each item.    Track activity type, minutes you were active, and distance you reached each day in the "Food and Activity Tracker."   Follow-Up Plan:  Attend session 19 in two weeks.   Bring completed "Food and Activity Trackers" to next session to be reviewed by Lifestyle Coach.

## 2018-09-15 ENCOUNTER — Encounter: Payer: Self-pay | Admitting: Registered"

## 2018-09-15 ENCOUNTER — Encounter: Payer: Medicare Other | Attending: Internal Medicine | Admitting: Registered"

## 2018-09-15 DIAGNOSIS — R7303 Prediabetes: Secondary | ICD-10-CM | POA: Diagnosis not present

## 2018-09-15 DIAGNOSIS — Z713 Dietary counseling and surveillance: Secondary | ICD-10-CM | POA: Insufficient documentation

## 2018-09-15 NOTE — Progress Notes (Signed)
Patient was seen on 09/15/18 for the Diabetes Prevention Program course at [facility]. By the end of this session patients are able to complete the following objectives:   Learning Objectives:  Identify which foods contain carbohydrates.   List components of a balanced meal.   Goals:   Record weight taken outside of class.   Track foods and beverages eaten each day in the "Food and Activity Tracker," including calories and fat grams for each item.    Track activity type, minutes active, and distance reached each day in the "Food and Activity Tracker."   Follow-Up Plan:  Attend next session.  Bring completed "Food and Activity Tracker" next week to be reviewed by Lifestyle Coach.

## 2018-09-17 ENCOUNTER — Other Ambulatory Visit: Payer: Self-pay | Admitting: Internal Medicine

## 2018-09-17 DIAGNOSIS — M25551 Pain in right hip: Secondary | ICD-10-CM

## 2018-10-08 ENCOUNTER — Other Ambulatory Visit: Payer: Self-pay | Admitting: Student in an Organized Health Care Education/Training Program

## 2018-10-08 DIAGNOSIS — I10 Essential (primary) hypertension: Secondary | ICD-10-CM

## 2018-10-13 ENCOUNTER — Encounter: Payer: Self-pay | Admitting: Registered"

## 2018-10-13 ENCOUNTER — Encounter: Payer: Medicare Other | Attending: Internal Medicine | Admitting: Registered"

## 2018-10-13 DIAGNOSIS — Z713 Dietary counseling and surveillance: Secondary | ICD-10-CM | POA: Insufficient documentation

## 2018-10-13 DIAGNOSIS — R7303 Prediabetes: Secondary | ICD-10-CM | POA: Insufficient documentation

## 2018-10-13 NOTE — Progress Notes (Signed)
Patient was seen on 10/13/18 for the Diabetes Prevention Program course at [facility]. By the end of this session patients are able to complete the following objectives:   Learning Objectives:  Describe the differences between unsaturated, saturated, and trans fat on heart health.   List dietary sources of unsaturated, saturated, and trans fats.  Explain ways to reduce intake of saturated fat and replace them with heart healthy fats.  Goals:   Record weight taken outside of class.   Track foods and beverages eaten each day in the "Food and Activity Tracker," including calories and fat grams for each item.    Track activity type, minutes you were active, and distance you reached each day in the "Food and Activity Tracker."   Follow-Up Plan:  Attend next session.   Bring completed "Food and Activity Trackers" to next session to be reviewed by Lifestyle Coach.
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
## 2018-10-17 ENCOUNTER — Ambulatory Visit (INDEPENDENT_AMBULATORY_CARE_PROVIDER_SITE_OTHER): Payer: Medicare Other | Admitting: Internal Medicine

## 2018-10-17 ENCOUNTER — Encounter: Payer: Self-pay | Admitting: Internal Medicine

## 2018-10-17 ENCOUNTER — Ambulatory Visit: Payer: Medicare Other | Admitting: Licensed Clinical Social Worker

## 2018-10-17 VITALS — BP 112/62 | HR 102 | Temp 97.6°F | Ht 68.4 in | Wt 288.6 lb

## 2018-10-17 DIAGNOSIS — M1711 Unilateral primary osteoarthritis, right knee: Secondary | ICD-10-CM | POA: Diagnosis not present

## 2018-10-17 DIAGNOSIS — J309 Allergic rhinitis, unspecified: Secondary | ICD-10-CM | POA: Diagnosis not present

## 2018-10-17 DIAGNOSIS — I10 Essential (primary) hypertension: Secondary | ICD-10-CM | POA: Diagnosis not present

## 2018-10-17 DIAGNOSIS — F4320 Adjustment disorder, unspecified: Secondary | ICD-10-CM

## 2018-10-17 DIAGNOSIS — M1129 Other chondrocalcinosis, multiple sites: Secondary | ICD-10-CM

## 2018-10-17 DIAGNOSIS — Z79899 Other long term (current) drug therapy: Secondary | ICD-10-CM

## 2018-10-17 DIAGNOSIS — Z87891 Personal history of nicotine dependence: Secondary | ICD-10-CM | POA: Diagnosis not present

## 2018-10-17 DIAGNOSIS — R7303 Prediabetes: Secondary | ICD-10-CM

## 2018-10-17 DIAGNOSIS — E041 Nontoxic single thyroid nodule: Secondary | ICD-10-CM | POA: Diagnosis not present

## 2018-10-17 DIAGNOSIS — Z6841 Body Mass Index (BMI) 40.0 and over, adult: Secondary | ICD-10-CM

## 2018-10-17 DIAGNOSIS — F4321 Adjustment disorder with depressed mood: Secondary | ICD-10-CM

## 2018-10-17 DIAGNOSIS — M1120 Other chondrocalcinosis, unspecified site: Secondary | ICD-10-CM

## 2018-10-17 DIAGNOSIS — Z79891 Long term (current) use of opiate analgesic: Secondary | ICD-10-CM | POA: Diagnosis not present

## 2018-10-17 HISTORY — DX: Adjustment disorder with depressed mood: F43.21

## 2018-10-17 HISTORY — DX: Adjustment disorder, unspecified: F43.20

## 2018-10-17 LAB — POCT GLYCOSYLATED HEMOGLOBIN (HGB A1C): HEMOGLOBIN A1C: 6.6 % — AB (ref 4.0–5.6)

## 2018-10-17 LAB — GLUCOSE, CAPILLARY: Glucose-Capillary: 121 mg/dL — ABNORMAL HIGH (ref 70–99)

## 2018-10-17 MED ORDER — LORATADINE 10 MG PO TABS: ORAL_TABLET | ORAL | 1 refills | Status: DC

## 2018-10-17 MED ORDER — HYDROCODONE-ACETAMINOPHEN 5-325 MG PO TABS: 1.0000 | ORAL_TABLET | Freq: Four times a day (QID) | ORAL | 0 refills | Status: DC | PRN

## 2018-10-17 NOTE — Assessment & Plan Note (Signed)
-  This problem is chronic and stable -Patient has a history of multinodular goiter status post lobectomy in November 2004 -We will recheck TSH today -No further work-up at this time

## 2018-10-17 NOTE — Progress Notes (Signed)
   Subjective:    Patient ID: Christine [name], female    DOB: [birth year], 83 y.o.   MRN: [id number]  HPI  I have seen and examined this patient.  Patient is here for routine follow-up of her hypertension and prediabetes.  Patient states that she has persistent pain in her right knee secondary to osteoarthritis.  She did follow-up with her orthopedic doctor and was advised to lose weight prior to a knee replacement surgery.  Patient also complains of occasional constipation but states that this resolves.  Patient also states that her sister died recently and she has been feeling sad about this as she is the only remaining sibling. She denies any other acute complaints at this time.  Review of Systems  Constitutional: Negative.   HENT: Negative.   Respiratory: Negative.   Cardiovascular: Positive for leg swelling. Negative for chest pain and palpitations.       Patient complains of occasional mild swelling in her right leg which resolves on lying down  Gastrointestinal: Negative.   Musculoskeletal: Positive for arthralgias. Negative for back pain and myalgias.  Neurological: Negative.   Psychiatric/Behavioral: Negative.        Objective:   Physical Exam Vitals signs reviewed.  Constitutional:      General: She is not in acute distress. HENT:     Head: Normocephalic and atraumatic.  Eyes:     Pupils: Pupils are equal, round, and reactive to light.  Neck:     Musculoskeletal: Neck supple.  Cardiovascular:     Rate and Rhythm: Normal rate and regular rhythm.     Heart sounds: Normal heart sounds.  Pulmonary:     Effort: Pulmonary effort is normal.     Breath sounds: Normal breath sounds.  Abdominal:     General: Bowel sounds are normal. There is no distension.     Palpations: Abdomen is soft.     Tenderness: There is no abdominal tenderness.  Musculoskeletal:        General: No swelling.  Lymphadenopathy:     Cervical: No cervical adenopathy.  Neurological:     General: No  focal deficit present.     Mental Status: She is alert and oriented to person, place, and time.  Psychiatric:     Comments: Patient does appear depressed today and cries when she talks about the loss of her sister           Assessment & Plan:  Please see problem based charting for assessment and plan:

## 2018-10-17 NOTE — Assessment & Plan Note (Signed)
-  This problem is chronic and stable -Patient's last A1c was 5.9 in March of last year -Patient continues to try and lose weight -We will recheck her A1c again today

## 2018-10-17 NOTE — Assessment & Plan Note (Signed)
-  Patient appears tearful today and states that her sister passed away over the holidays from lung cancer -She states that she is the only 1 of her siblings alive now -I have referred her to behavioral health and Miquel Dunn spoke with her today and she will continue to follow-up with her for grief counseling -No further work-up at this time

## 2018-10-17 NOTE — Patient Instructions (Addendum)
-  It was a pleasure seeing you today -I am sorry for the loss of your sister.  I have referred you to Miquel Dunn our Education officer, museum for grief counseling -Please follow-up with your rheumatologist for your pseudogout -We will check some blood work on you today -Please follow-up with orthopedic doctor for possible knee replacement -Please continue to try to lose weight.  You are doing a great job! -Please call me if you have any questions

## 2018-10-17 NOTE — Assessment & Plan Note (Signed)
-  This problem is chronic and stable -This patient's symptoms are well controlled with loratadine -We will refill this medication today

## 2018-10-17 NOTE — Assessment & Plan Note (Signed)
-  This problem is chronic and stable -Patient follows with rheumatology (Dr. Estanislado Pandy) for pseudogout as well as primary OA in her hands and knees -Patient states that she has not been compliant with the colchicine as she feels that her symptoms have been better controlled.  I encouraged her to follow-up with rheumatology and discuss whether she needs to continue this medication. -No further work-up at this time

## 2018-10-17 NOTE — Assessment & Plan Note (Signed)
BP Readings from Last 3 Encounters:  10/17/18 112/62  05/10/18 (!) 142/93  04/28/18 (!) 154/84    Lab Results  Component Value Date   NA 138 04/20/2018   K 4.5 04/20/2018   CREATININE 0.87 04/20/2018    Assessment: Blood pressure control:  Well controlled Progress toward BP goal:   At goal Comments: Patient is compliant with spironolactone 25 mg and Norvasc 5 mg daily  Plan: Medications:  continue current medications Educational resources provided:   Self management tools provided:   Other plans: We will check BMP today

## 2018-10-17 NOTE — Assessment & Plan Note (Signed)
-  Patient has a history of morbid obesity with a BMI greater than 40 -She continues to try and lose weight and is down a pound since her last visit in July -She is congratulated on this and encouraged to try and continue to lose weight -Patient also follows up with [name] (dietitian) for this as well -No further work-up at this time -No further work-up at this time

## 2018-10-17 NOTE — Assessment & Plan Note (Signed)
>>  ASSESSMENT AND PLAN FOR OSTEOARTHRITIS OF RIGHT KNEE WRITTEN ON 10/17/2018  9:26 AM BY Earl Lagos, MD  -Patient continues to endorse right knee pain which occasionally is very severe -This is likely secondary to her osteoarthritis -Patient states that she followed up with orthopedic surgery and was advised to lose weight prior to her knee replacement -Patient continues to lose weight and is down 8 pounds since her visit in July of last year -Patient encouraged to continue to try and lose weight -We will continue with pain control with OTC Tylenol and Voltaren gel -Patient very sparingly uses Vicodin for severe pain of her right knee.  Her last prescription was in August and was only for 20 pills.  She states that she she still has 1 pill left but would like a refill for use for severe pain.  I believe that the benefits of this medication outweigh the risks.  Will reorder this for her

## 2018-10-17 NOTE — Assessment & Plan Note (Signed)
-  Patient continues to endorse right knee pain which occasionally is very severe -This is likely secondary to her osteoarthritis -Patient states that she followed up with orthopedic surgery and was advised to lose weight prior to her knee replacement -Patient continues to lose weight and is down 8 pounds since her visit in July of last year -Patient encouraged to continue to try and lose weight -We will continue with pain control with OTC Tylenol and Voltaren gel -Patient very sparingly uses Vicodin for severe pain of her right knee.  Her last prescription was in August and was only for 20 pills.  She states that she she still has 1 pill left but would like a refill for use for severe pain.  I believe that the benefits of this medication outweigh the risks.  Will reorder this for her

## 2018-10-18 ENCOUNTER — Encounter: Payer: Self-pay | Admitting: Internal Medicine

## 2018-10-18 ENCOUNTER — Encounter: Payer: Self-pay | Admitting: Licensed Clinical Social Worker

## 2018-10-18 LAB — BMP8+ANION GAP
ANION GAP: 20 mmol/L — AB (ref 10.0–18.0)
BUN/Creatinine Ratio: 21 (ref 12–28)
BUN: 20 mg/dL (ref 8–27)
CO2: 22 mmol/L (ref 20–29)
CREATININE: 0.95 mg/dL (ref 0.57–1.00)
Calcium: 10.1 mg/dL (ref 8.7–10.3)
Chloride: 97 mmol/L (ref 96–106)
GFR, EST AFRICAN AMERICAN: 65 mL/min/{1.73_m2} (ref >59)
GFR, EST NON AFRICAN AMERICAN: 57 mL/min/{1.73_m2} — AB (ref >59)
Glucose: 127 mg/dL — ABNORMAL HIGH (ref 65–99)
Potassium: 4.3 mmol/L (ref 3.5–5.2)
SODIUM: 139 mmol/L (ref 134–144)

## 2018-10-18 LAB — TSH: TSH: 1.47 u[IU]/mL (ref 0.450–4.500)

## 2018-10-18 NOTE — BH Specialist Note (Signed)
Integrated Behavioral Health Initial Visit  MRN: [id number] Name: Christine [name]  Number of [facility] Clinician visits:: 1/6 Session Start time: 9:07  Session End time: 9:30 Total time: 23 minutes  Type of Service: [place] Interpretor:No.    Warm Hand Off Completed. Yes, by Dr. Dareen Piano.       SUBJECTIVE: Christine [name] is a 83 y.o. female  whom attended the session individually.  Patient was referred by Dr. Dareen Piano for grief counseling and elevated PHQ-9 screen. Patient reports the following symptoms/concerns: bereavement due to loss of her sister over the holidays, physical pain, and challenges related to her weight gain.  Duration of problem: increased over the past month due to the loss of her sister; Severity of problem: mild  OBJECTIVE: Mood: Depressed and Affect: Tearful Risk of harm to self or others: No plan to harm self or others  LIFE CONTEXT: Family and Social: Patient lives alone. Patient has a nurse aid that comes to her home a few hours a day to assist her with daily living tasks. Patient has adult children that are all very supportive. Patient recently lost her sister before the holidays, and her sister was her last living sibling. Patient was unable to attend the funeral due to challenges with her knee and inability to walk for long distances. Patient is carrying guilt due to being unable to attend her sister's funeral. Patient is not sharing her level of grief with her natural supports due to not wanting them to worry about her level of sadness.  Self-Care: Patient reported challenges walking due to her knee. Patient has a goal to loose weight in order to move forward with her knee surgery. Patient plans to meet with our in clinic Registered Dietician to form a plan for weight loss and diabetes management. Patient also established a goal to contact her Orthopaedic physician that will perform the surgery to  identify the amount of necessary weight loss to move forward with the surgery.  Life Changes: Patient recently lost her sister, and was unable to attend her funeral.   GOALS ADDRESSED: Patient will: 1. Reduce symptoms of: depression and guilt.  2. Increase knowledge and/or ability of: coping skills and to share her thoughts openly with her natural supports.   3. Demonstrate ability to: Increase healthy adjustment to current life circumstances and Begin healthy grieving over loss  INTERVENTIONS: Interventions utilized: Solution-Focused Strategies and Supportive Counseling. Grief counseling was provided to the patient in order to allow her to process the recent loss of her sister. Therapist encouraged the patient to share with her natural supports her feelings around losing her sister. CBT was used to challenge negative thoughts around guilt, and the therapist encouraged the patient to practice self-compassion. MI was used to explore ways the patient could reach her goals, and reduce her knee paint. Solution-focused therapy was implemented in order to establish goals for the patient to begin working on in order to move closer to her wanted knee surgery.  Standardized Assessments completed: PHQ 9  ASSESSMENT: Patient currently experiencing acute depressive symptoms due to the loss of her sister. Patient expressed negative feelings related to her health issues and lack of mobility prior to the loss of her sister, but the feelings have increased since her sister's passing due to her inability to attend the funeral. Patient identified that she has a fear of falling, and worries frequently about her limited mobility. Patient has no plan or thoughts to harm herself in anyway.  Patient identified that she would benefit from losing weight, and wants to begin meeting with our dietician more frequently.   Patient may benefit from bi-weekly counseling and meeting with our in-clinic dietician.  PLAN: 1. Follow  up with behavioral health clinician on : two weeks.   2. Referral(s): [facility] (In Clinic)   [place], [place], [place]

## 2018-10-30 ENCOUNTER — Encounter: Payer: Self-pay | Admitting: Rheumatology

## 2018-10-30 ENCOUNTER — Ambulatory Visit (INDEPENDENT_AMBULATORY_CARE_PROVIDER_SITE_OTHER): Payer: Medicare Other | Admitting: Rheumatology

## 2018-10-30 VITALS — BP 158/91 | HR 107 | Resp 16 | Ht 68.25 in | Wt 288.0 lb

## 2018-10-30 DIAGNOSIS — M1711 Unilateral primary osteoarthritis, right knee: Secondary | ICD-10-CM | POA: Diagnosis not present

## 2018-10-30 DIAGNOSIS — Z8679 Personal history of other diseases of the circulatory system: Secondary | ICD-10-CM | POA: Diagnosis not present

## 2018-10-30 DIAGNOSIS — M19042 Primary osteoarthritis, left hand: Secondary | ICD-10-CM | POA: Diagnosis not present

## 2018-10-30 DIAGNOSIS — Z8639 Personal history of other endocrine, nutritional and metabolic disease: Secondary | ICD-10-CM

## 2018-10-30 DIAGNOSIS — R7303 Prediabetes: Secondary | ICD-10-CM | POA: Diagnosis not present

## 2018-10-30 DIAGNOSIS — M1120 Other chondrocalcinosis, unspecified site: Secondary | ICD-10-CM

## 2018-10-30 DIAGNOSIS — M7061 Trochanteric bursitis, right hip: Secondary | ICD-10-CM

## 2018-10-30 DIAGNOSIS — L409 Psoriasis, unspecified: Secondary | ICD-10-CM | POA: Diagnosis not present

## 2018-10-30 DIAGNOSIS — M19041 Primary osteoarthritis, right hand: Secondary | ICD-10-CM | POA: Diagnosis not present

## 2018-10-30 DIAGNOSIS — Z8719 Personal history of other diseases of the digestive system: Secondary | ICD-10-CM | POA: Diagnosis not present

## 2018-10-30 MED ORDER — TRIAMCINOLONE ACETONIDE 40 MG/ML IJ SUSP
40.0000 mg | INTRAMUSCULAR | Status: AC | PRN
  Administered 2018-10-30: 40 mg via INTRA_ARTICULAR

## 2018-10-30 MED ORDER — LIDOCAINE HCL 1 % IJ SOLN
1.5000 mL | INTRAMUSCULAR | Status: AC | PRN
  Administered 2018-10-30: 1.5 mL

## 2018-10-30 NOTE — Patient Instructions (Signed)
Knee Exercises                        Ask your health care provider which exercises are safe for you. Do exercises exactly as told by your health care provider and adjust them as directed. It is normal to feel mild stretching, pulling, tightness, or discomfort as you do these exercises, but you should stop right away if you feel sudden pain or your pain gets worse.Do not begin these exercises until told by your health care provider.  STRETCHING AND RANGE OF MOTION EXERCISES  These exercises warm up your muscles and joints and improve the movement and flexibility of your knee. These exercises also help to relieve pain, numbness, and tingling.  Exercise A: Knee Extension, Prone  1. Lie on your abdomen on a bed.  2. Place your left / right knee just beyond the edge of the surface so your knee is not on the bed. You can put a towel under your left / right thigh just above your knee for comfort.  3. Relax your leg muscles and allow gravity to straighten your knee. You should feel a stretch behind your left / right knee.  4. Hold this position for __________ seconds.  5. Scoot up so your knee is supported between repetitions.  Repeat __________ times. Complete this stretch __________ times a day.  Exercise B: Knee Flexion, Active  1. Lie on your back with both knees straight. If this causes back discomfort, bend your left / right knee so your foot is flat on the floor.  2. Slowly slide your left / right heel back toward your buttocks until you feel a gentle stretch in the front of your knee or thigh.  3. Hold this position for __________ seconds.  4. Slowly slide your left / right heel back to the starting position.  Repeat __________ times. Complete this exercise __________ times a day.  Exercise C: Quadriceps, Prone  1. Lie on your abdomen on a firm surface, such as a bed or padded floor.  2. Bend your left / right knee and hold your ankle. If you cannot reach your ankle or pant leg, loop a belt around your foot and  grab the belt instead.  3. Gently pull your heel toward your buttocks. Your knee should not slide out to the side. You should feel a stretch in the front of your thigh and knee.  4. Hold this position for __________ seconds.  Repeat __________ times. Complete this stretch __________ times a day.  Exercise D: Hamstring, Supine  1. Lie on your back.  2. Loop a belt or towel over the ball of your left / right foot. The ball of your foot is on the walking surface, right under your toes.  3. Straighten your left / right knee and slowly pull on the belt to raise your leg until you feel a gentle stretch behind your knee.  ? Do not let your left / right knee bend while you do this.  ? Keep your other leg flat on the floor.  4. Hold this position for __________ seconds.  Repeat __________ times. Complete this stretch __________ times a day.  STRENGTHENING EXERCISES  These exercises build strength and endurance in your knee. Endurance is the ability to use your muscles for a long time, even after they get tired.  Exercise E: Quadriceps, Isometric  1. Lie on your back with your left / right leg extended and your   other knee bent. Put a rolled towel or small pillow under your knee if told by your health care provider.  2. Slowly tense the muscles in the front of your left / right thigh. You should see your kneecap slide up toward your hip or see increased dimpling just above the knee. This motion will push the back of the knee toward the floor.  3. For __________ seconds, keep the muscle as tight as you can without increasing your pain.  4. Relax the muscles slowly and completely.  Repeat __________ times. Complete this exercise __________ times a day.  Exercise F: Straight Leg Raises - Quadriceps  1. Lie on your back with your left / right leg extended and your other knee bent.  2. Tense the muscles in the front of your left / right thigh. You should see your kneecap slide up or see increased dimpling just above the knee. Your  thigh may even shake a bit.  3. Keep these muscles tight as you raise your leg 4-6 inches (10-15 cm) off the floor. Do not let your knee bend.  4. Hold this position for __________ seconds.  5. Keep these muscles tense as you lower your leg.  6. Relax your muscles slowly and completely after each repetition.  Repeat __________ times. Complete this exercise __________ times a day.  Exercise G: Hamstring, Isometric  1. Lie on your back on a firm surface.  2. Bend your left / right knee approximately __________ degrees.  3. Dig your left / right heel into the surface as if you are trying to pull it toward your buttocks. Tighten the muscles in the back of your thighs to dig as hard as you can without increasing any pain.  4. Hold this position for __________ seconds.  5. Release the tension gradually and allow your muscles to relax completely for __________ seconds after each repetition.  Repeat __________ times. Complete this exercise __________ times a day.  Exercise H: Hamstring Curls  If told by your health care provider, do this exercise while wearing ankle weights. Begin with __________ weights. Then increase the weight by 1 lb (0.5 kg) increments. Do not wear ankle weights that are more than __________.  1. Lie on your abdomen with your legs straight.  2. Bend your left / right knee as far as you can without feeling pain. Keep your hips flat against the floor.  3. Hold this position for __________ seconds.  4. Slowly lower your leg to the starting position.  Repeat __________ times. Complete this exercise __________ times a day.  Exercise I: Squats (Quadriceps)  1. Stand in front of a table, with your feet and knees pointing straight ahead. You may rest your hands on the table for balance but not for support.  2. Slowly bend your knees and lower your hips like you are going to sit in a chair.  ? Keep your weight over your heels, not over your toes.  ? Keep your lower legs upright so they are parallel with the  table legs.  ? Do not let your hips go lower than your knees.  ? Do not bend lower than told by your health care provider.  ? If your knee pain increases, do not bend as low.  3. Hold the squat position for __________ seconds.  4. Slowly push with your legs to return to standing. Do not use your hands to pull yourself to standing.  Repeat __________ times. Complete this exercise __________ times a   day. Exercise K: Straight Leg Raises - Hip Abductors 1. Lie on your side with your left / right leg in the top position. Lie so your head, shoulder, knee, and hip line up. You may bend your bottom knee to help you keep your balance. 2. Roll your hips slightly forward so your hips are stacked directly over each other and your left / right knee is facing forward. 3. Leading with your heel, lift your top leg 4-6 inches (10-15 cm). You should feel the muscles in your outer hip lifting. ? Do not let your foot drift forward. ? Do not let your knee roll toward the ceiling. 4. Hold this position for __________ seconds. 5. Slowly return your leg to the starting position. 6. Let your muscles relax completely after each repetition. Repeat __________ times. Complete this exercise __________ times a day. Exercise L: Straight Leg Raises - Hip Extensors 1. Lie on your abdomen on a firm surface. You can put a pillow under your hips if that is more comfortable. 2. Tense the muscles in your buttocks and lift your left / right leg about 4-6 inches (10-15 cm). Keep your  knee straight as you lift your leg. 3. Hold this position for __________ seconds. 4. Slowly lower your leg to the starting position. 5. Let your leg relax completely after each repetition. Repeat __________ times. Complete this exercise __________ times a day. This information is not intended to replace advice given to you by your health care provider. Make sure you discuss any questions you have with your health care provider. Document Released: 08/11/2005 Document Revised: 06/21/2016 Document Reviewed: 08/03/2015 Elsevier Interactive Patient Education  2019 [place] [facility] Ask your health care provider which exercises are safe for you. Do exercises exactly as told by your health care provider and adjust them as directed. It is normal to feel mild stretching, pulling, tightness, or discomfort as you do these exercises, but you should stop right away if you feel sudden pain or your pain gets worse.Do not begin these exercises until told by your health care provider. Stretching and range of motion exercises These exercises warm up your muscles and joints and improve the movement and flexibility of your hip and pelvis. Exercise A: Quadriceps, prone  1. Lie on your abdomen on a firm surface, such as a bed or padded floor. 2. Bend your left / right knee and hold your ankle. If you cannot reach your ankle or pant leg, loop a belt around your foot and grab the belt instead. 3. Gently pull your heel toward your buttocks. Your knee should not slide out to the side. You should feel a stretch in the front of your thigh and knee. 4. Hold this position for __________ seconds. Repeat __________ times. Complete this stretch __________ times a day. Exercise B: Iliotibial band  1. Lie on your side with your left / right leg in the top position. 2. Bend both of your knees and grab your left / right ankle. Stretch out your bottom arm to help you balance. 3. Slowly bring your top  knee back so your thigh goes behind your trunk. 4. Slowly lower your top leg toward the floor until you feel a gentle stretch on the outside of your left / right hip and thigh. If you do not feel a stretch and your knee will not fall farther, place the heel of your other foot on top of your knee and pull your knee down toward the floor  with your foot. 5. Hold this position for __________ seconds. Repeat __________ times. Complete this stretch __________ times a day. Strengthening exercises These exercises build strength and endurance in your hip and pelvis. Endurance is the ability to use your muscles for a long time, even after they get tired. Exercise C: Straight leg raises (hip abductors)  1. Lie on your side with your left / right leg in the top position. Lie so your head, shoulder, knee, and hip line up. You may bend your bottom knee to help you balance. 2. Roll your hips slightly forward so your hips are stacked directly over each other and your left / right knee is facing forward. 3. Tense the muscles in your outer thigh and lift your top leg 4-6 inches (10-15 cm). 4. Hold this position for __________ seconds. 5. Slowly return to the starting position. Let your muscles relax completely before doing another repetition. Repeat __________ times. Complete this exercise __________ times a day. Exercise D: Straight leg raises (hip extensors) 1. Lie on your abdomen on your bed or a firm surface. You can put a pillow under your hips if that is more comfortable. 2. Bend your left / right knee so your foot is straight up in the air. 3. Squeeze your buttock muscles and lift your left / right thigh off the bed. Do not let your back arch. 4. Tense this muscle as hard as you can without increasing any knee pain. 5. Hold this position for __________ seconds. 6. Slowly lower your leg to the starting position and allow it to relax completely. Repeat __________ times. Complete this exercise __________  times a day. Exercise E: Hip hike 1. Stand sideways on a bottom step. Stand on your left / right leg with your other foot unsupported next to the step. You can hold onto the railing or wall if needed for balance. 2. Keep your knees straight and your torso square. Then, lift your left / right hip up toward the ceiling. 3. Slowly let your left / right hip lower toward the floor, past the starting position. Your foot should get closer to the floor. Do not lean or bend your knees. Repeat __________ times. Complete this exercise __________ times a day. This information is not intended to replace advice given to you by your health care provider. Make sure you discuss any questions you have with your health care provider. Document Released: 09/27/2005 Document Revised: 06/01/2016 Document Reviewed: 08/29/2015 Elsevier Interactive Patient Education  2019 [employer].

## 2018-11-17 ENCOUNTER — Encounter: Payer: Medicare Other | Attending: Internal Medicine | Admitting: Registered"

## 2018-11-17 ENCOUNTER — Encounter: Payer: Self-pay | Admitting: Registered"

## 2018-11-17 DIAGNOSIS — Z713 Dietary counseling and surveillance: Secondary | ICD-10-CM | POA: Insufficient documentation

## 2018-11-17 DIAGNOSIS — R7303 Prediabetes: Secondary | ICD-10-CM | POA: Diagnosis not present

## 2018-11-17 NOTE — Progress Notes (Signed)
Patient was seen on 11/17/18 for Session 19 of Diabetes Prevention Program course at [facility]. By the end of this session patients are able to complete the following objectives:   Learning Objectives:  List ways to make recipes healthier.   List lower-fat and lower-calorie substitutions for common ingredients.   Identify low-fat cooking methods.   Describe how to choose a cookbook that works best for their needs.   Goals:   Record weight taken outside of class.   Track foods and beverages eaten each day in the "Food and Activity Tracker," including calories and fat grams for each item.    Track activity type, minutes you were active, and distance you reached each day in the "Food and Activity Tracker."   Follow-Up Plan:  Attend session 20 in three weeks.   Bring completed "Food and Activity Trackers" to next session to be reviewed by Lifestyle Coach.

## 2018-12-15 ENCOUNTER — Encounter: Payer: Medicare Other | Admitting: Registered"

## 2018-12-19 ENCOUNTER — Other Ambulatory Visit: Payer: Self-pay

## 2018-12-19 DIAGNOSIS — J309 Allergic rhinitis, unspecified: Secondary | ICD-10-CM

## 2018-12-19 DIAGNOSIS — I10 Essential (primary) hypertension: Secondary | ICD-10-CM

## 2018-12-19 NOTE — Telephone Encounter (Signed)
atorvastatin (LIPITOR) 40 MG tablet  diclofenac sodium (VOLTAREN) 1 % GEL   spironolactone (ALDACTONE) 25 MG tablet   amLODipine (NORVASC) 5 MG tablet  loratadine (CLARITIN) 10 MG tablet  REFILL REQUEST @  [id number] - [place], [place] [phone] (Phone) [phone] (Fax)

## 2018-12-20 MED ORDER — LORATADINE 10 MG PO TABS: ORAL_TABLET | ORAL | 1 refills | Status: DC

## 2018-12-20 MED ORDER — DICLOFENAC SODIUM 1 % TD GEL: TRANSDERMAL | 1 refills | Status: DC

## 2018-12-20 MED ORDER — AMLODIPINE BESYLATE 5 MG PO TABS: 5.0000 mg | ORAL_TABLET | Freq: Every day | ORAL | 1 refills | Status: DC

## 2018-12-20 MED ORDER — GABAPENTIN 300 MG PO CAPS: 300.0000 mg | ORAL_CAPSULE | Freq: Every day | ORAL | 5 refills | Status: DC

## 2019-01-08 ENCOUNTER — Other Ambulatory Visit: Payer: Self-pay | Admitting: *Deleted

## 2019-01-08 DIAGNOSIS — I10 Essential (primary) hypertension: Secondary | ICD-10-CM

## 2019-01-08 MED ORDER — SPIRONOLACTONE 25 MG PO TABS: 25.0000 mg | ORAL_TABLET | Freq: Every day | ORAL | 1 refills | Status: DC

## 2019-03-02 ENCOUNTER — Encounter: Payer: Medicare Other | Attending: Internal Medicine

## 2019-03-08 ENCOUNTER — Other Ambulatory Visit: Payer: Self-pay | Admitting: Internal Medicine

## 2019-03-08 DIAGNOSIS — R7303 Prediabetes: Secondary | ICD-10-CM

## 2019-03-14 ENCOUNTER — Encounter (HOSPITAL_BASED_OUTPATIENT_CLINIC_OR_DEPARTMENT_OTHER): Payer: Medicare Other | Admitting: Registered"

## 2019-03-14 ENCOUNTER — Encounter: Payer: Self-pay | Admitting: Registered"

## 2019-03-14 DIAGNOSIS — R7303 Prediabetes: Secondary | ICD-10-CM | POA: Diagnosis not present

## 2019-03-14 NOTE — Progress Notes (Signed)
On 03/14/2019 patient completed a session of the Diabetes Prevention Program course. By the end of this session patients are able to complete the following objectives:   Learning Objectives:  Define fiber and describe the difference between insoluble and soluble fiber   List foods that are good sources of fiber  Explain the health benefits of fiber   Describe ways to increase volume of meals and snacks while staying within fat goal.   Goals:   Record weight taken outside of class.   Track foods and beverages eaten each day in the "Food and Activity Tracker," including calories and fat grams for each item.    Track activity type, minutes you were active, and distance you reached each day in the "Food and Activity Tracker."   Follow-Up Plan:  Attend next session.   Bring completed "Food and Activity Trackers" to next session to be reviewed by Lifestyle Coach.

## 2019-03-16 ENCOUNTER — Telehealth: Payer: Self-pay | Admitting: Internal Medicine

## 2019-03-16 ENCOUNTER — Encounter: Payer: Medicaid Other | Attending: Internal Medicine | Admitting: Registered"

## 2019-03-16 NOTE — Telephone Encounter (Signed)
Need refill on loratadine (CLARITIN) 10 MG tablet  ; pt contact [phone]  [id number] - [place], [place] - [address] AT [place]  Pt said that the pharmacy is charging her 14.00 now it was 3.00 pt is unable to afford it, pls contact [phone]

## 2019-03-30 ENCOUNTER — Ambulatory Visit (HOSPITAL_BASED_OUTPATIENT_CLINIC_OR_DEPARTMENT_OTHER): Payer: Medicare Other | Admitting: Registered"

## 2019-03-30 ENCOUNTER — Encounter: Payer: Self-pay | Admitting: Registered"

## 2019-03-30 DIAGNOSIS — R7303 Prediabetes: Secondary | ICD-10-CM

## 2019-03-30 NOTE — Progress Notes (Signed)
On 03/30/19 patient completed the final session of the Diabetes Prevention Program course. By the end of this session patients are able to complete the following objectives:   Learning Objectives:  Reflect on lifestyle changes they have made since starting the DPP.   Set long-term goals to promote continued maintenance of lifestyle changes made during the program.   Goals:   Work toward reaching new long-term goals set during class.   Follow-Up Plan:  Contact Lifestyle Coach with questions/concerns PRN.
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
## 2019-04-30 ENCOUNTER — Ambulatory Visit: Payer: Self-pay | Admitting: Rheumatology

## 2019-05-11 ENCOUNTER — Other Ambulatory Visit: Payer: Self-pay | Admitting: Internal Medicine

## 2019-05-15 ENCOUNTER — Other Ambulatory Visit: Payer: Self-pay

## 2019-05-15 ENCOUNTER — Encounter (INDEPENDENT_AMBULATORY_CARE_PROVIDER_SITE_OTHER): Payer: Self-pay

## 2019-05-15 ENCOUNTER — Ambulatory Visit (INDEPENDENT_AMBULATORY_CARE_PROVIDER_SITE_OTHER): Payer: Medicare Other | Admitting: Internal Medicine

## 2019-05-15 ENCOUNTER — Encounter: Payer: Self-pay | Admitting: Internal Medicine

## 2019-05-15 VITALS — BP 138/72 | HR 108 | Temp 98.1°F | Ht 68.5 in | Wt 293.1 lb

## 2019-05-15 DIAGNOSIS — I10 Essential (primary) hypertension: Secondary | ICD-10-CM | POA: Diagnosis not present

## 2019-05-15 DIAGNOSIS — Z79899 Other long term (current) drug therapy: Secondary | ICD-10-CM

## 2019-05-15 DIAGNOSIS — E7849 Other hyperlipidemia: Secondary | ICD-10-CM | POA: Diagnosis not present

## 2019-05-15 DIAGNOSIS — R32 Unspecified urinary incontinence: Secondary | ICD-10-CM

## 2019-05-15 DIAGNOSIS — F32A Depression, unspecified: Secondary | ICD-10-CM

## 2019-05-15 DIAGNOSIS — M1120 Other chondrocalcinosis, unspecified site: Secondary | ICD-10-CM

## 2019-05-15 DIAGNOSIS — M1711 Unilateral primary osteoarthritis, right knee: Secondary | ICD-10-CM

## 2019-05-15 DIAGNOSIS — E785 Hyperlipidemia, unspecified: Secondary | ICD-10-CM

## 2019-05-15 DIAGNOSIS — F329 Major depressive disorder, single episode, unspecified: Secondary | ICD-10-CM | POA: Diagnosis not present

## 2019-05-15 DIAGNOSIS — H43393 Other vitreous opacities, bilateral: Secondary | ICD-10-CM | POA: Insufficient documentation

## 2019-05-15 DIAGNOSIS — Z87891 Personal history of nicotine dependence: Secondary | ICD-10-CM | POA: Diagnosis not present

## 2019-05-15 DIAGNOSIS — J309 Allergic rhinitis, unspecified: Secondary | ICD-10-CM | POA: Diagnosis not present

## 2019-05-15 DIAGNOSIS — Z6841 Body Mass Index (BMI) 40.0 and over, adult: Secondary | ICD-10-CM

## 2019-05-15 DIAGNOSIS — N3941 Urge incontinence: Secondary | ICD-10-CM

## 2019-05-15 MED ORDER — AMLODIPINE BESYLATE 5 MG PO TABS: 5.0000 mg | ORAL_TABLET | Freq: Every day | ORAL | 1 refills | Status: DC

## 2019-05-15 MED ORDER — LORATADINE 10 MG PO TABS: ORAL_TABLET | ORAL | 1 refills | Status: DC

## 2019-05-15 MED ORDER — SPIRONOLACTONE 25 MG PO TABS: 25.0000 mg | ORAL_TABLET | Freq: Every day | ORAL | 1 refills | Status: DC

## 2019-05-15 NOTE — Assessment & Plan Note (Signed)
-  Patient states that she occasionally sees black spots in her field of vision bilaterally and then they disappear afterwards -She states that she thought it was her hair that she was seen but even after moving her hair to the side she still has black spots -Patient has not seen an ophthalmologist in a while and will refer her to ophthalmology for an eye exam -No further work-up at this time

## 2019-05-15 NOTE — Patient Instructions (Signed)
-  It was a pleasure seeing you today -I have refilled your medications and sent them to the [facility] on [address] -Please follow-up with your urologist for your occasional episodes of urinary incontinence -I have put in referral to the ophthalmologist for you -We will check some blood work on you today -Please call me if you have any questions or concerns or if you have difficulty obtaining her medication

## 2019-05-15 NOTE — Assessment & Plan Note (Signed)
>>  ASSESSMENT AND PLAN FOR OSTEOARTHRITIS OF RIGHT KNEE WRITTEN ON 05/15/2019 10:58 AM BY NARENDRA, NISCHAL, MD  -Patient states that she still has bilateral knee pain worse on the right but that the symptoms are well controlled with Voltaren gel -Patient noted to have mild tenderness over right ankle to palpation as well as trace bilateral lower extremity edema -Patient states that the lower extremity swelling resolves on elevating her legs -Patient did follow-up with orthopedic surgery as an outpatient and was advised to lose weight prior to knee replacement -Patient states that she is trying to lose weight but it is difficult to do as she is not able to go outside as much and walk as much secondary to the pandemic -We will continue with pain control with over-the-counter Tylenol as well as Voltaren gel as needed -Patient states that she uses Voltaren gel twice a day as needed and that this controls her pain -No further work-up at this time

## 2019-05-15 NOTE — Assessment & Plan Note (Signed)
-  This problem is chronic and stable -Patient follows up with rheumatology (Dr. Sula Soda) for pseudogout as well as primarily in her hands and knees -Patient states that she has not been taking her colchicine but that her symptoms are well controlled -Have taken the colchicine off her medication list -Patient follow-up with rheumatology later this month -No further work-up at this time

## 2019-05-15 NOTE — Assessment & Plan Note (Signed)
-  Patient states that her mood had been okay but that since the pandemic started she has not been able to socialize much and she occasionally feels bored and sad secondary to this -Her PHQ 9 score today is 7 -We will follow-up with her at her next visit and if her PHQ 9 score remains elevated will consider referring her to [facility] for follow-up as well as possibly starting her on an SSRI -No further work-up at this time

## 2019-05-15 NOTE — Assessment & Plan Note (Signed)
-  This problem is chronic and stable -We will continue with atorvastatin 40 mg daily for this -We will recheck a lipid panel today -No further work-up at this time

## 2019-05-15 NOTE — Assessment & Plan Note (Signed)
-  Patient has a history of morbid obesity with a BMI greater than 40 -Patient states that it is been difficult to try and exercise secondary to the pandemic as she is not going out as much as she used to -Patient's weight is increased to 293 pounds today (approximately an 9 to 10 pound increase from her prior weight) -I explained to the patient importance of trying to lose weight -We will consider having patient follow-up with [name] again for diet recommendations.  Patient used to follow with her in the past

## 2019-05-15 NOTE — Assessment & Plan Note (Signed)
-  Patient follows up with urology (Dr. Gloriann Loan) as an outpatient for this -Patient was started on tolterodine extended release 4 mg for this -However, patient states that she has not been taking this medication as she feels like her urinary symptoms have improved even though she does have occasional episodes of incontinence (urge incontinence -feels like she suddenly needs to go to the restroom and does not make it in time) -Patient make an appointment to follow-up with Dr. Gloriann Loan -She does not want to resume her tolterodine at this time and states that her symptoms have been better -No further work-up at this time

## 2019-05-15 NOTE — Assessment & Plan Note (Signed)
-  Patient states that she still has bilateral knee pain worse on the right but that the symptoms are well controlled with Voltaren gel -Patient noted to have mild tenderness over right ankle to palpation as well as trace bilateral lower extremity edema -Patient states that the lower extremity swelling resolves on elevating her legs -Patient did follow-up with orthopedic surgery as an outpatient and was advised to lose weight prior to knee replacement -Patient states that she is trying to lose weight but it is difficult to do as she is not able to go outside as much and walk as much secondary to the pandemic -We will continue with pain control with over-the-counter Tylenol as well as Voltaren gel as needed -Patient states that she uses Voltaren gel twice a day as needed and that this controls her pain -No further work-up at this time

## 2019-05-15 NOTE — Progress Notes (Signed)
   Subjective:    Patient ID: Christine [name], female    DOB: [birth year], 83 y.o.   MRN: [id number]  HPI  I have seen and examined this patient.  Patient is here for routine follow-up of her hypertension and osteoarthritis.  Patient states that she has intermittent episodes where she sees black spots in her visual fields.  Patient denies any other complaints today and states that she feels well overall.  Patient also states that she is compliant with all her medications.  Review of Systems  Constitutional: Negative.   HENT: Negative.   Respiratory: Negative.   Cardiovascular: Negative.   Gastrointestinal: Negative.   Genitourinary:       Patient states that she has intermittent episodes where she will need to urinate and will not make it in time to the restroom  Musculoskeletal: Positive for arthralgias. Negative for myalgias.  Neurological: Negative.   Psychiatric/Behavioral: Negative.        Objective:   Physical Exam Constitutional:      Appearance: Normal appearance.  HENT:     Head: Normocephalic and atraumatic.     Mouth/Throat:     Mouth: Mucous membranes are moist.     Pharynx: Oropharynx is clear.  Neck:     Musculoskeletal: Neck supple.  Cardiovascular:     Rate and Rhythm: Tachycardia present.     Heart sounds: Normal heart sounds.  Pulmonary:     Effort: Pulmonary effort is normal.     Breath sounds: Normal breath sounds. No wheezing or rales.  Abdominal:     General: Abdomen is flat. Bowel sounds are normal.     Tenderness: There is no abdominal tenderness. There is no guarding.  Musculoskeletal:        General: Swelling and tenderness (Mild tenderness to palpation over right foot and ankle) present.     Comments: Trace pitting edema noted bilateral lower extremity  Lymphadenopathy:     Cervical: No cervical adenopathy.  Neurological:     General: No focal deficit present.     Mental Status: She is alert and oriented to person, place, and time.   Psychiatric:        Mood and Affect: Mood normal.        Behavior: Behavior normal.           Assessment & Plan:  Please see problem based charting for assessment and plan:
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
## 2019-05-15 NOTE — Assessment & Plan Note (Signed)
BP Readings from Last 3 Encounters:  05/15/19 138/72  10/30/18 (!) 158/91  10/17/18 112/62    Lab Results  Component Value Date   NA 139 10/17/2018   K 4.3 10/17/2018   CREATININE 0.95 10/17/2018    Assessment: Blood pressure control:  Well controlled Progress toward BP goal:   At goal Comments: Patient is compliant with spironolactone 25 mg daily as well as amlodipine 5 mg daily  Plan: Medications:  continue current medications Educational resources provided:   Self management tools provided:   Other plans: We will check BMP today

## 2019-05-15 NOTE — Assessment & Plan Note (Signed)
-  This problem is chronic and stable -Patient symptoms are well controlled with loratadine -We will send in a refill for this medication today

## 2019-05-16 LAB — BMP8+ANION GAP
Anion Gap: 15 mmol/L (ref 10.0–18.0)
BUN/Creatinine Ratio: 16 (ref 12–28)
BUN: 16 mg/dL (ref 8–27)
CO2: 26 mmol/L (ref 20–29)
Calcium: 10 mg/dL (ref 8.7–10.3)
Chloride: 97 mmol/L (ref 96–106)
Creatinine, Ser: 0.99 mg/dL (ref 0.57–1.00)
GFR calc Af Amer: 62 mL/min/{1.73_m2} (ref >59)
GFR calc non Af Amer: 54 mL/min/{1.73_m2} — ABNORMAL LOW (ref >59)
Glucose: 134 mg/dL — ABNORMAL HIGH (ref 65–99)
Potassium: 4.1 mmol/L (ref 3.5–5.2)
Sodium: 138 mmol/L (ref 134–144)

## 2019-05-16 LAB — LIPID PANEL
Chol/HDL Ratio: 3.1 ratio (ref 0.0–4.4)
Cholesterol, Total: 174 mg/dL (ref 100–199)
HDL: 56 mg/dL (ref >39)
LDL Calculated: 97 mg/dL (ref 0–99)
Triglycerides: 106 mg/dL (ref 0–149)
VLDL Cholesterol Cal: 21 mg/dL (ref 5–40)

## 2019-05-17 ENCOUNTER — Telehealth: Payer: Self-pay | Admitting: Internal Medicine

## 2019-05-17 NOTE — Telephone Encounter (Signed)
I called the patient to discuss the results of her blood work with her.  Patient's BMP was within normal limits except for a mildly elevated blood sugar.  Patient was concerned about her mildly elevated blood sugar and I explained to the patient that it was not a fasting blood sugar and that we can check an A1c on her next visit to make sure that her blood sugars have not been running high over the last couple of months.  Patient's lipid panel was also noted and is at baseline.  No further work-up at this time.  We will continue to monitor closely.  Patient expresses understanding and is in agreement with plan.

## 2019-05-28 ENCOUNTER — Other Ambulatory Visit: Payer: Self-pay

## 2019-05-28 ENCOUNTER — Ambulatory Visit (INDEPENDENT_AMBULATORY_CARE_PROVIDER_SITE_OTHER): Payer: Medicare Other | Admitting: Rheumatology

## 2019-05-28 ENCOUNTER — Encounter: Payer: Self-pay | Admitting: Rheumatology

## 2019-05-28 VITALS — BP 151/74 | HR 94 | Resp 15 | Ht 68.25 in | Wt 292.0 lb

## 2019-05-28 DIAGNOSIS — M19041 Primary osteoarthritis, right hand: Secondary | ICD-10-CM

## 2019-05-28 DIAGNOSIS — Z8679 Personal history of other diseases of the circulatory system: Secondary | ICD-10-CM

## 2019-05-28 DIAGNOSIS — M1120 Other chondrocalcinosis, unspecified site: Secondary | ICD-10-CM

## 2019-05-28 DIAGNOSIS — M19042 Primary osteoarthritis, left hand: Secondary | ICD-10-CM

## 2019-05-28 DIAGNOSIS — M1711 Unilateral primary osteoarthritis, right knee: Secondary | ICD-10-CM | POA: Diagnosis not present

## 2019-05-28 DIAGNOSIS — R7303 Prediabetes: Secondary | ICD-10-CM

## 2019-05-28 DIAGNOSIS — L409 Psoriasis, unspecified: Secondary | ICD-10-CM

## 2019-05-28 DIAGNOSIS — Z8639 Personal history of other endocrine, nutritional and metabolic disease: Secondary | ICD-10-CM

## 2019-05-28 DIAGNOSIS — Z8719 Personal history of other diseases of the digestive system: Secondary | ICD-10-CM | POA: Diagnosis not present

## 2019-05-28 DIAGNOSIS — M7061 Trochanteric bursitis, right hip: Secondary | ICD-10-CM

## 2019-05-28 MED ORDER — COLCHICINE 0.6 MG PO TABS: 0.6000 mg | ORAL_TABLET | Freq: Every day | ORAL | 0 refills | Status: DC

## 2019-05-28 NOTE — Patient Instructions (Addendum)
Hip Bursitis Rehab Ask your health care provider which exercises are safe for you. Do exercises exactly as told by your health care provider and adjust them as directed. It is normal to feel mild stretching, pulling, tightness, or discomfort as you do these exercises. Stop right away if you feel sudden pain or your pain gets worse. Do not begin these exercises until told by your health care provider. Stretching exercise This exercise warms up your muscles and joints and improves the movement and flexibility of your hip. This exercise also helps to relieve pain and stiffness. Iliotibial band stretch An iliotibial band is a strong band of muscle tissue that runs from the outer side of your hip to the outer side of your thigh and knee. 1. Lie on your side with your left / right leg in the top position. 2. Bend your left / right knee and grab your ankle. Stretch out your bottom arm to help you balance. 3. Slowly bring your knee back so your thigh is behind your body. 4. Slowly lower your knee toward the floor until you feel a gentle stretch on the outside of your left / right thigh. If you do not feel a stretch and your knee will not fall farther, place the heel of your other foot on top of your knee and pull your knee down toward the floor with your foot. 5. Hold this position for __________ seconds. 6. Slowly return to the starting position. Repeat __________ times. Complete this exercise __________ times a day. Strengthening exercises These exercises build strength and endurance in your hip and pelvis. Endurance is the ability to use your muscles for a long time, even after they get tired. Bridge This exercise strengthens the muscles that move your thigh backward (hip extensors). 1. Lie on your back on a firm surface with your knees bent and your feet flat on the floor. 2. Tighten your buttocks muscles and lift your buttocks off the floor until your trunk is level with your thighs. ? Do not arch  your back. ? You should feel the muscles working in your buttocks and the back of your thighs. If you do not feel these muscles, slide your feet 1-2 inches (2.5-5 cm) farther away from your buttocks. ? If this exercise is too easy, try doing it with your arms crossed over your chest. 3. Hold this position for __________ seconds. 4. Slowly lower your hips to the starting position. 5. Let your muscles relax completely after each repetition. Repeat __________ times. Complete this exercise __________ times a day. Squats This exercise strengthens the muscles in front of your thigh and knee (quadriceps). 1. Stand in front of a table, with your feet and knees pointing straight ahead. You may rest your hands on the table for balance but not for support. 2. Slowly bend your knees and lower your hips like you are going to sit in a chair. ? Keep your weight over your heels, not over your toes. ? Keep your lower legs upright so they are parallel with the table legs. ? Do not let your hips go lower than your knees. ? Do not bend lower than told by your health care provider. ? If your hip pain increases, do not bend as low. 3. Hold the squat position for __________ seconds. 4. Slowly push with your legs to return to standing. Do not use your hands to pull yourself to standing. Repeat __________ times. Complete this exercise __________ times a day. Hip hike 1. Stand   sideways on a bottom step. Stand on your left / right leg with your other foot unsupported next to the step. You can hold on to the railing or wall for balance if needed. 2. Keep your knees straight and your torso square. Then lift your left / right hip up toward the ceiling. 3. Hold this position for __________ seconds. 4. Slowly let your left / right hip lower toward the floor, past the starting position. Your foot should get closer to the floor. Do not lean or bend your knees. Repeat __________ times. Complete this exercise __________ times a  day. Single leg stand 1. Without shoes, stand near a railing or in a doorway. You may hold on to the railing or door frame as needed for balance. 2. Squeeze your left / right buttock muscles, then lift up your other foot. ? Do not let your left / right hip push out to the side. ? It is helpful to stand in front of a mirror for this exercise so you can watch your hip. 3. Hold this position for __________ seconds. Repeat __________ times. Complete this exercise __________ times a day. This information is not intended to replace advice given to you by your health care provider. Make sure you discuss any questions you have with your health care provider. Document Released: 11/04/2004 Document Revised: 01/22/2019 Document Reviewed: 01/22/2019 Elsevier Patient Education  [place] Band Syndrome Rehab Ask your health care provider which exercises are safe for you. Do exercises exactly as told by your health care provider and adjust them as directed. It is normal to feel mild stretching, pulling, tightness, or discomfort as you do these exercises. Stop right away if you feel sudden pain or your pain gets significantly worse. Do not begin these exercises until told by your health care provider. Stretching and range-of-motion exercises These exercises warm up your muscles and joints and improve the movement and flexibility of your hip and pelvis. Quadriceps stretch, prone  1. Lie on your abdomen on a firm surface, such as a bed or padded floor (prone position). 2. Bend your left / right knee and reach back to hold your ankle or pant leg. If you cannot reach your ankle or pant leg, loop a belt around your foot and grab the belt instead. 3. Gently pull your heel toward your buttocks. Your knee should not slide out to the side. You should feel a stretch in the front of your thigh and knee (quadriceps). 4. Hold this position for __________ seconds. Repeat __________ times. Complete this  exercise __________ times a day. Iliotibial band stretch An iliotibial band is a strong band of muscle tissue that runs from the outer side of your hip to the outer side of your thigh and knee. 1. Lie on your side with your left / right leg in the top position. 2. Bend both of your knees and grab your left / right ankle. Stretch out your bottom arm to help you balance. 3. Slowly bring your top knee back so your thigh goes behind your trunk. 4. Slowly lower your top leg toward the floor until you feel a gentle stretch on the outside of your left / right hip and thigh. If you do not feel a stretch and your knee will not fall farther, place the heel of your other foot on top of your knee and pull your knee down toward the floor with your foot. 5. Hold this position for __________ seconds. Repeat __________ times.  Complete this exercise __________ times a day. Strengthening exercises These exercises build strength and endurance in your hip and pelvis. Endurance is the ability to use your muscles for a long time, even after they get tired. Straight leg raises, side-lying This exercise strengthens the muscles that rotate the leg at the hip and move it away from your body (hip abductors). 1. Lie on your side with your left / right leg in the top position. Lie so your head, shoulder, hip, and knee line up. You may bend your bottom knee to help you balance. 2. Roll your hips slightly forward so your hips are stacked directly over each other and your left / right knee is facing forward. 3. Tense the muscles in your outer thigh and lift your top leg 4-6 inches (10-15 cm). 4. Hold this position for __________ seconds. 5. Slowly return to the starting position. Let your muscles relax completely before doing another repetition. Repeat __________ times. Complete this exercise __________ times a day. Leg raises, prone This exercise strengthens the muscles that move the hips (hip extensors). 1. Lie on your  abdomen on your bed or a firm surface. You can put a pillow under your hips if that is more comfortable for your lower back. 2. Bend your left / right knee so your foot is straight up in the air. 3. Squeeze your buttocks muscles and lift your left / right thigh off the bed. Do not let your back arch. 4. Tense your thigh muscle as hard as you can without increasing any knee pain. 5. Hold this position for __________ seconds. 6. Slowly lower your leg to the starting position and allow it to relax completely. Repeat __________ times. Complete this exercise __________ times a day. Hip hike 1. Stand sideways on a bottom step. Stand on your left / right leg with your other foot unsupported next to the step. You can hold on to the railing or wall for balance if needed. 2. Keep your knees straight and your torso square. Then lift your left / right hip up toward the ceiling. 3. Slowly let your left / right hip lower toward the floor, past the starting position. Your foot should get closer to the floor. Do not lean or bend your knees. Repeat __________ times. Complete this exercise __________ times a day. This information is not intended to replace advice given to you by your health care provider. Make sure you discuss any questions you have with your health care provider. Document Released: 09/27/2005 Document Revised: 01/18/2019 Document Reviewed: 07/19/2018 Elsevier Patient Education  2020 [employer].

## 2019-06-11 ENCOUNTER — Encounter: Payer: Self-pay | Admitting: *Deleted

## 2019-06-11 DIAGNOSIS — R7309 Other abnormal glucose: Secondary | ICD-10-CM | POA: Diagnosis not present

## 2019-06-11 DIAGNOSIS — H524 Presbyopia: Secondary | ICD-10-CM | POA: Diagnosis not present

## 2019-06-11 DIAGNOSIS — H52203 Unspecified astigmatism, bilateral: Secondary | ICD-10-CM | POA: Diagnosis not present

## 2019-06-11 DIAGNOSIS — H25813 Combined forms of age-related cataract, bilateral: Secondary | ICD-10-CM | POA: Diagnosis not present

## 2019-06-11 DIAGNOSIS — H43393 Other vitreous opacities, bilateral: Secondary | ICD-10-CM | POA: Diagnosis not present

## 2019-06-11 DIAGNOSIS — H5203 Hypermetropia, bilateral: Secondary | ICD-10-CM | POA: Diagnosis not present

## 2019-06-11 LAB — HM DIABETES EYE EXAM

## 2019-08-14 ENCOUNTER — Ambulatory Visit (INDEPENDENT_AMBULATORY_CARE_PROVIDER_SITE_OTHER): Payer: Medicare Other | Admitting: Internal Medicine

## 2019-08-14 ENCOUNTER — Other Ambulatory Visit: Payer: Self-pay

## 2019-08-14 ENCOUNTER — Encounter: Payer: Self-pay | Admitting: Internal Medicine

## 2019-08-14 VITALS — BP 120/58 | HR 100 | Temp 98.1°F | Wt 296.7 lb

## 2019-08-14 DIAGNOSIS — M1120 Other chondrocalcinosis, unspecified site: Secondary | ICD-10-CM | POA: Diagnosis not present

## 2019-08-14 DIAGNOSIS — R7303 Prediabetes: Secondary | ICD-10-CM

## 2019-08-14 DIAGNOSIS — I10 Essential (primary) hypertension: Secondary | ICD-10-CM | POA: Diagnosis not present

## 2019-08-14 DIAGNOSIS — Z79899 Other long term (current) drug therapy: Secondary | ICD-10-CM

## 2019-08-14 DIAGNOSIS — F329 Major depressive disorder, single episode, unspecified: Secondary | ICD-10-CM

## 2019-08-14 DIAGNOSIS — Z887 Allergy status to serum and vaccine status: Secondary | ICD-10-CM | POA: Diagnosis not present

## 2019-08-14 DIAGNOSIS — M19011 Primary osteoarthritis, right shoulder: Secondary | ICD-10-CM

## 2019-08-14 DIAGNOSIS — M1711 Unilateral primary osteoarthritis, right knee: Secondary | ICD-10-CM

## 2019-08-14 DIAGNOSIS — Z6841 Body Mass Index (BMI) 40.0 and over, adult: Secondary | ICD-10-CM | POA: Diagnosis not present

## 2019-08-14 DIAGNOSIS — F339 Major depressive disorder, recurrent, unspecified: Secondary | ICD-10-CM

## 2019-08-14 DIAGNOSIS — Z0000 Encounter for general adult medical examination without abnormal findings: Secondary | ICD-10-CM

## 2019-08-14 DIAGNOSIS — F32A Depression, unspecified: Secondary | ICD-10-CM

## 2019-08-14 DIAGNOSIS — M159 Polyosteoarthritis, unspecified: Secondary | ICD-10-CM

## 2019-08-14 DIAGNOSIS — Z87891 Personal history of nicotine dependence: Secondary | ICD-10-CM

## 2019-08-14 LAB — GLUCOSE, CAPILLARY: Glucose-Capillary: 129 mg/dL — ABNORMAL HIGH (ref 70–99)

## 2019-08-14 LAB — POCT GLYCOSYLATED HEMOGLOBIN (HGB A1C): Hemoglobin A1C: 6.7 % — AB (ref 4.0–5.6)

## 2019-08-14 NOTE — Assessment & Plan Note (Signed)
-  This problem is chronic and stable -Patient states that she walks about the day but has not been following a good diet and admits to eating a lot of chocolate -I explained to her the importance of following a good diet and that she will need to try and lose more weight prior to her being accepted for knee replacement surgery -Patient states that she will monitor her diet and try to lose weight

## 2019-08-14 NOTE — Progress Notes (Signed)
   Subjective:    Patient ID: Christine [name], female    DOB: 1936-06-07, 83 y.o.   MRN: [id number]  HPI  I have seen and examined this patient.  Patient is here for routine follow-up of her hypertension and osteoarthritis.  Patient states that she feels okay today.  She does have some pain in her left shoulder as well as her right shoulder which she attributes to her arthritis.  Patient states that her knee pain is well controlled on her current medications.  Patient also states that her son passed very recently and this has been causing her to feel depressed.  She denies any other complaints at this time and states that she is compliant with all her medications   Review of Systems  Constitutional: Negative.   HENT: Negative.   Respiratory: Negative.   Cardiovascular: Negative.   Gastrointestinal: Negative.   Genitourinary: Negative.   Musculoskeletal: Positive for arthralgias.  Neurological: Negative.   Psychiatric/Behavioral: Negative.        Objective:   Physical Exam Constitutional:      Appearance: Normal appearance.  HENT:     Head: Normocephalic and atraumatic.  Neck:     Musculoskeletal: Neck supple.  Cardiovascular:     Rate and Rhythm: Normal rate and regular rhythm.     Heart sounds: Normal heart sounds.  Pulmonary:     Effort: Pulmonary effort is normal.     Breath sounds: Normal breath sounds. No wheezing or rales.  Abdominal:     General: Bowel sounds are normal.     Palpations: Abdomen is soft.     Tenderness: There is no abdominal tenderness. There is no guarding.  Musculoskeletal:        General: Swelling present. No tenderness.     Comments: Trace bilateral lower extremity edema noted  Lymphadenopathy:     Cervical: No cervical adenopathy.  Neurological:     General: No focal deficit present.     Mental Status: She is alert and oriented to person, place, and time.  Psychiatric:        Mood and Affect: Mood normal.        Behavior: Behavior  normal.           Assessment & Plan:  Please see problem discharging for assessment and plan:

## 2019-08-14 NOTE — Assessment & Plan Note (Addendum)
>>  ASSESSMENT AND PLAN FOR OSTEOARTHRITIS WRITTEN ON 06/01/2023  8:55 AM BY Mercie Eon, MD  >>ASSESSMENT AND PLAN FOR OSTEOARTHRITIS OF RIGHT KNEE WRITTEN ON 08/14/2019 10:15 AM BY NARENDRA, NISCHAL, MD  -This problem is chronic and stable -Patient has bilateral knee pain chronically -Patient has no tenderness over her knees today but does have trace bilateral lower extremity edema which resolves on elevating her legs -Patient did follow-up with orthopedic surgery and was advised to lose weight prior to her knee replacement -Pain is currently well controlled on Voltaren gel -No further work-up at this time -Advised patient to continue with her diet and exercise and to try and lose weight so that she can get a knee replacement  >>ASSESSMENT AND PLAN FOR DEGENERATIVE JOINT DISEASE OF SHOULDER, RIGHT WRITTEN ON 08/14/2019 10:14 AM BY NARENDRA, NISCHAL, MD  -Patient complaining of pain in both her shoulders today and states that when it is cold out she gets pain there secondary to her arthritis -She states that when she warms up the pain will get better and that she will also use Voltaren gel for this which does help her pain -No further work-up at this time

## 2019-08-14 NOTE — Assessment & Plan Note (Signed)
-  This problem is chronic and stable -Patient follows up with rheumatologist outpatient (Dr. Patrecia Pour) -She saw her recently and was advised to take her colchicine daily instead of as needed -Patient states that she has been doing this but has not noticed much change -We will continue with colchicine daily for now and patient will follow up with rheumatology in January -No further work-up at this time

## 2019-08-14 NOTE — Assessment & Plan Note (Signed)
-  Patient complaining of pain in both her shoulders today and states that when it is cold out she gets pain there secondary to her arthritis -She states that when she warms up the pain will get better and that she will also use Voltaren gel for this which does help her pain -No further work-up at this time

## 2019-08-14 NOTE — Patient Instructions (Signed)
-  It was a pleasure seeing you today -We will check an A1c on you today -Your blood pressure is well controlled.  Keep up the great work! -I will put in a referral to [facility] today for counseling -Please follow-up with me in 3 months -Please call me with any questions or concerns

## 2019-08-14 NOTE — Assessment & Plan Note (Signed)
-  This problem is chronic and stable -Patient has bilateral knee pain chronically -Patient has no tenderness over her knees today but does have trace bilateral lower extremity edema which resolves on elevating her legs -Patient did follow-up with orthopedic surgery and was advised to lose weight prior to her knee replacement -Pain is currently well controlled on Voltaren gel -No further work-up at this time -Advised patient to continue with her diet and exercise and to try and lose weight so that she can get a knee replacement

## 2019-08-14 NOTE — Assessment & Plan Note (Signed)
-  Patient has not received a flu shot as she had an allergic reaction to this and is not eligible to take this

## 2019-08-14 NOTE — Assessment & Plan Note (Signed)
-  This problem is chronic and stable -Patient's last A1c was elevated to 6.6 -We will check a repeat A1c today -We discussed that an A1c of 6.6 is consistent with diabetes and that if she continues to have worsening A1c he will need to be started on medication -Patient is resistant to starting medication at this time and wants to try and lose weight.  I explained to her that we will follow-up A1c results and then discuss management depending on the results. -Patient is in agreement with this plan. -No further work-up at this time

## 2019-08-14 NOTE — Assessment & Plan Note (Signed)
BP Readings from Last 3 Encounters:  08/14/19 (!) 120/58  05/28/19 (!) 151/74  05/15/19 138/72    Lab Results  Component Value Date   NA 138 05/15/2019   K 4.1 05/15/2019   CREATININE 0.99 05/15/2019    Assessment: Blood pressure control:  Well controlled Progress toward BP goal:   At goal Comments: Patient is compliant with amlodipine 5 mg as well as spironolactone 25 mg  Plan: Medications:  continue current medications Educational resources provided:   Self management tools provided:   Other plans: We will check BMP at follow-up visit

## 2019-08-14 NOTE — Assessment & Plan Note (Signed)
-  Patient states that she been having intermittent episodes of depression and feeling sad especially since her son passed away recently -Patient's PHQ 9 score is 10 today which is up from 7 at her previous visit -We discussed referring her to [facility] for grief counseling and she is agreeable to this.  We will refer her to [facility] today -Would also consider starting the patient on SSRI if she continues to have elevated PHQ-9 scores -No further work-up at this time

## 2019-08-16 ENCOUNTER — Encounter: Payer: Self-pay | Admitting: Licensed Clinical Social Worker

## 2019-08-16 ENCOUNTER — Telehealth: Payer: Self-pay | Admitting: Licensed Clinical Social Worker

## 2019-08-16 NOTE — Telephone Encounter (Signed)
Patient was called (1st attempt) to discuss the referral from her doctor. Patient did not answer, and a vm was left for the patient. A letter will also be mailed.

## 2019-08-20 ENCOUNTER — Other Ambulatory Visit: Payer: Self-pay | Admitting: Physician Assistant

## 2019-08-20 NOTE — Telephone Encounter (Addendum)
Last Visit: 05/28/19 Next visit: 11/08/19 Labs: 05/15/19 BMP- Glucose 134   Okay to refill Colcrys?

## 2019-08-21 NOTE — Telephone Encounter (Signed)
ok 

## 2019-08-27 ENCOUNTER — Telehealth: Payer: Self-pay | Admitting: Internal Medicine

## 2019-08-27 NOTE — Telephone Encounter (Signed)
Pt is wanting a call back [phone]

## 2019-08-30 ENCOUNTER — Telehealth: Payer: Self-pay | Admitting: Licensed Clinical Social Worker

## 2019-08-30 NOTE — Telephone Encounter (Signed)
I had a message to call this patient. Called pt, no answer, and left a vm.

## 2019-09-27 ENCOUNTER — Ambulatory Visit: Payer: Medicare Other | Admitting: Licensed Clinical Social Worker

## 2019-09-30 ENCOUNTER — Other Ambulatory Visit: Payer: Self-pay | Admitting: Student in an Organized Health Care Education/Training Program

## 2019-09-30 DIAGNOSIS — E7849 Other hyperlipidemia: Secondary | ICD-10-CM

## 2019-10-01 NOTE — Telephone Encounter (Signed)
Pt is calling regarding medicine [phone]

## 2019-10-19 ENCOUNTER — Telehealth: Payer: Self-pay | Admitting: Rheumatology

## 2019-10-19 NOTE — Telephone Encounter (Signed)
Patient called stating she received a letter in the mail that her Colcrys medication is no longer covered under her plan and to contact the doctor's office.  Patient is requesting a return call.

## 2019-10-19 NOTE — Telephone Encounter (Signed)
Ran test claim, refill too soon due to patient's last fill. Plan prefers Mitigare or colchicine tablets.

## 2019-10-22 NOTE — Telephone Encounter (Signed)
Attempted to contact patient and left message on machine to advise patient that Plan prefers [place] or colchicine tablets.

## 2019-10-25 NOTE — Addendum Note (Signed)
Addended by: Hulan Fray on: 10/25/2019 03:30 PM   Modules accepted: Orders

## 2019-11-02 NOTE — Progress Notes (Signed)
Virtual Visit via Telephone Note  I connected with Christine [name] on 11/08/19 at  8:15 AM EST by telephone and verified that I am speaking with the correct person using two identifiers.  Location: Patient: Home Provider: Clinic  This service was conducted via virtual visit. The patient was located at home. I was located in my office.  Consent was obtained prior to the virtual visit and is aware of possible charges through their insurance for this visit.  The patient is an established patient.  Dr. Estanislado Pandy, MD conducted the virtual visit and Hazel Sams, PA-C acted as scribe during the service.  Office staff helped with scheduling follow up visits after the service was conducted.     I discussed the limitations, risks, security and privacy concerns of performing an evaluation and management service by telephone and the availability of in person appointments. I also discussed with the patient that there may be a patient responsible charge related to this service. The patient expressed understanding and agreed to proceed.  CC: Pain in both hands  History of Present Illness: Christine [name] is a 84 y.o. female with history of osteoarthritis and pseudogout. She takes Colchicine 0.6 mg 1 tablet by mouth daily.  She has not had any recent pseudogout flares.  She has intermittent pain and stiffness in both hands.  She has difficulty opening jars at times.  She has occasional discomfort in the right knee joint.  She uses voltaren gel topically as needed for pain relief. She has intermittent right trochanteric bursitis.  She had a cortisone injection on 10/30/18 which provided significant pain relief.     Review of Systems  Constitutional: Positive for malaise/fatigue. Negative for fever.  HENT: Negative for congestion.   Eyes: Negative for photophobia, pain, discharge and redness.  Respiratory: Negative for cough, shortness of breath and wheezing.   Cardiovascular: Negative for chest pain,  palpitations and leg swelling.  Gastrointestinal: Positive for constipation. Negative for blood in stool and diarrhea.  Genitourinary: Negative for dysuria.  Musculoskeletal: Positive for joint pain. Negative for back pain, myalgias and neck pain.       +Morning stiffness   Skin: Negative for rash.  Neurological: Negative for dizziness, weakness and headaches.  Psychiatric/Behavioral: Positive for memory loss. Negative for depression. The patient is not nervous/anxious and does not have insomnia.    Observations/Objective:  Physical Exam  Constitutional: She is oriented to person, place, and time.  Neurological: She is alert and oriented to person, place, and time.  Psychiatric: Mood, memory, affect and judgment normal.     Patient reports morning stiffness for 2 hours.   Patient reports nocturnal pain.  Difficulty dressing/grooming: Reports Difficulty climbing stairs: Reports Difficulty getting out of chair: Reports Difficulty using hands for taps, buttons, cutlery, and/or writing: Reports  Assessment and Plan: Visit Diagnoses: Pseudogout: She has not had any recent pseudogout flares. She takes Colchicine 0.6 mg 1 tablet by mouth daily, which controls her discomfort and inflammation.  She will continue taking colchicine as prescribed.  She does not need any refills at this time.  She will have CBC and CMP drawn at her upcoming appointment with her PCP.  She was advised to notify us if she develops signs or symptoms of a pseudogout flare.  She will follow up in 5 months.  Primary osteoarthritis of both hands: She has intermittent pain and inflammation in both hands.  No inflammation at this time.  She has difficulty opening jars and writing in the mornings.  Joint protection and muscle strengthening were discussed.   Primary osteoarthritis of right knee - Dr. Brunilda Payor has intermittent discomfort in the right knee joint.  No joint swelling at this time.   Scalp psoriasis    Trochanteric bursitis, right hip - She has intermittent right trochanter bursitis. She had a cortisone injection on 10/30/18, which provided significant pain relief.  Other medical conditions are listed as follows:   History of gastroesophageal reflux (GERD)   Pre-diabetes   History of hyperlipidemia   History of hypertension   Follow Up Instructions: She will follow up in 5 months   I discussed the assessment and treatment plan with the patient. The patient was provided an opportunity to ask questions and all were answered. The patient agreed with the plan and demonstrated an understanding of the instructions.   The patient was advised to call back or seek an in-person evaluation if the symptoms worsen or if the condition fails to improve as anticipated.  I provided 15 minutes of non-face-to-face time during this encounter.  Bo Merino, MD   Scribed by-  Hazel Sams, PA-C

## 2019-11-08 ENCOUNTER — Telehealth: Payer: Self-pay | Admitting: Rheumatology

## 2019-11-08 ENCOUNTER — Other Ambulatory Visit: Payer: Self-pay

## 2019-11-08 ENCOUNTER — Encounter: Payer: Self-pay | Admitting: Rheumatology

## 2019-11-08 ENCOUNTER — Telehealth (INDEPENDENT_AMBULATORY_CARE_PROVIDER_SITE_OTHER): Payer: Medicare Other | Admitting: Rheumatology

## 2019-11-08 DIAGNOSIS — M7061 Trochanteric bursitis, right hip: Secondary | ICD-10-CM

## 2019-11-08 DIAGNOSIS — R7303 Prediabetes: Secondary | ICD-10-CM

## 2019-11-08 DIAGNOSIS — M19042 Primary osteoarthritis, left hand: Secondary | ICD-10-CM

## 2019-11-08 DIAGNOSIS — Z8639 Personal history of other endocrine, nutritional and metabolic disease: Secondary | ICD-10-CM

## 2019-11-08 DIAGNOSIS — Z87891 Personal history of nicotine dependence: Secondary | ICD-10-CM | POA: Diagnosis not present

## 2019-11-08 DIAGNOSIS — M1120 Other chondrocalcinosis, unspecified site: Secondary | ICD-10-CM

## 2019-11-08 DIAGNOSIS — M19041 Primary osteoarthritis, right hand: Secondary | ICD-10-CM | POA: Diagnosis not present

## 2019-11-08 DIAGNOSIS — M1711 Unilateral primary osteoarthritis, right knee: Secondary | ICD-10-CM

## 2019-11-08 DIAGNOSIS — Z8679 Personal history of other diseases of the circulatory system: Secondary | ICD-10-CM

## 2019-11-08 DIAGNOSIS — Z8719 Personal history of other diseases of the digestive system: Secondary | ICD-10-CM

## 2019-11-08 DIAGNOSIS — L409 Psoriasis, unspecified: Secondary | ICD-10-CM | POA: Diagnosis not present

## 2019-11-08 NOTE — Telephone Encounter (Signed)
-----   Message from Shona Needles, RT sent at 11/08/2019 11:40 AM EST ----- Regarding: 5 MONTH F/U

## 2019-11-08 NOTE — Telephone Encounter (Signed)
I LMOM for patient to call, and schedule a follow up appointment for 5 months (around 04/07/20).

## 2019-12-11 ENCOUNTER — Encounter: Payer: Self-pay | Admitting: Internal Medicine

## 2019-12-11 ENCOUNTER — Ambulatory Visit (INDEPENDENT_AMBULATORY_CARE_PROVIDER_SITE_OTHER): Payer: Medicare Other | Admitting: Internal Medicine

## 2019-12-11 ENCOUNTER — Other Ambulatory Visit: Payer: Self-pay

## 2019-12-11 VITALS — BP 142/71 | HR 97 | Temp 98.2°F | Ht 68.0 in | Wt 293.4 lb

## 2019-12-11 DIAGNOSIS — M1120 Other chondrocalcinosis, unspecified site: Secondary | ICD-10-CM

## 2019-12-11 DIAGNOSIS — Z79899 Other long term (current) drug therapy: Secondary | ICD-10-CM | POA: Diagnosis not present

## 2019-12-11 DIAGNOSIS — I10 Essential (primary) hypertension: Secondary | ICD-10-CM | POA: Diagnosis not present

## 2019-12-11 DIAGNOSIS — R7303 Prediabetes: Secondary | ICD-10-CM

## 2019-12-11 DIAGNOSIS — Z6841 Body Mass Index (BMI) 40.0 and over, adult: Secondary | ICD-10-CM

## 2019-12-11 DIAGNOSIS — G8929 Other chronic pain: Secondary | ICD-10-CM | POA: Diagnosis not present

## 2019-12-11 DIAGNOSIS — E041 Nontoxic single thyroid nodule: Secondary | ICD-10-CM | POA: Diagnosis not present

## 2019-12-11 DIAGNOSIS — Z87891 Personal history of nicotine dependence: Secondary | ICD-10-CM

## 2019-12-11 DIAGNOSIS — Z0000 Encounter for general adult medical examination without abnormal findings: Secondary | ICD-10-CM

## 2019-12-11 DIAGNOSIS — M1711 Unilateral primary osteoarthritis, right knee: Secondary | ICD-10-CM

## 2019-12-11 DIAGNOSIS — F32A Depression, unspecified: Secondary | ICD-10-CM

## 2019-12-11 DIAGNOSIS — M19011 Primary osteoarthritis, right shoulder: Secondary | ICD-10-CM | POA: Diagnosis not present

## 2019-12-11 DIAGNOSIS — E890 Postprocedural hypothyroidism: Secondary | ICD-10-CM

## 2019-12-11 DIAGNOSIS — E7849 Other hyperlipidemia: Secondary | ICD-10-CM | POA: Diagnosis not present

## 2019-12-11 DIAGNOSIS — E785 Hyperlipidemia, unspecified: Secondary | ICD-10-CM | POA: Diagnosis not present

## 2019-12-11 DIAGNOSIS — F329 Major depressive disorder, single episode, unspecified: Secondary | ICD-10-CM | POA: Diagnosis not present

## 2019-12-11 LAB — GLUCOSE, CAPILLARY: Glucose-Capillary: 134 mg/dL — ABNORMAL HIGH (ref 70–99)

## 2019-12-11 LAB — POCT GLYCOSYLATED HEMOGLOBIN (HGB A1C): Hemoglobin A1C: 6.9 % — AB (ref 4.0–5.6)

## 2019-12-11 NOTE — Assessment & Plan Note (Signed)
-  On patient last visit her PHQ-9 score had increased to 10 and she has been having intermittent episodes of depression since her son passed away -I attempted to refer her to [facility] on her last visit for grief counseling but patient did not end up following up with her as she was scared to come to the office because of Covid -I have put another referral to [facility] today for a phone appointment.  Patient states that she is agreeable to this. -Patient will fill out a PHQ-9 score today as well -No further work-up for now

## 2019-12-11 NOTE — Patient Instructions (Signed)
-  It was a pleasure seeing you again today -We will check some blood work on you today including your thyroid function, blood counts, kidney function as well as cholesterol panel -Please take your colchicine every day.  I think this help you with the pain that you are experiencing -Blood pressure is mildly elevated today.  We will continue with the current medications for now. -Please call me with any questions or concerns -Please call me if you need any refills of the medications

## 2019-12-11 NOTE — Assessment & Plan Note (Addendum)
-  This problem is chronic and stable -Patient's last A1c was 6.7 -We will recheck her A1c today -If the patient's A1c continues to worsen we will revisit the discussion of starting medication for this with her -In the meantime patient is attempting to lose weight on her own and this may help with her prediabetes as well -No further work-up at this time  Addendum: -Patient's A1c has worsened to 6.9 today.  On her next appointment we will discuss initiating Metformin or a GLP-1 agonist with her

## 2019-12-11 NOTE — Assessment & Plan Note (Addendum)
>>  ASSESSMENT AND PLAN FOR OSTEOARTHRITIS WRITTEN ON 06/01/2023  8:55 AM BY Mercie Eon, MD  >>ASSESSMENT AND PLAN FOR OSTEOARTHRITIS OF RIGHT KNEE WRITTEN ON 12/11/2019  9:11 AM BY NARENDRA, NISCHAL, MD  -This problem is chronic and stable -Patient has bilateral chronic knee pain but her symptoms are well controlled with Voltaren gel -Patient was advised continue placement by her orthopedic surgeon but needs to lose weight prior to this -Patient is continuing to walk (walks 1 mile a day) and attempt lose weight before her surgery. -No further work-up at this time.  We will continue with Voltaren gel as needed  >>ASSESSMENT AND PLAN FOR DEGENERATIVE JOINT DISEASE OF SHOULDER, RIGHT WRITTEN ON 12/11/2019  9:11 AM BY NARENDRA, NISCHAL, MD  -Patient is complaining of pain in both her shoulders today and states that she has had worsening pain over the last month. -She does state the Voltaren gel helps with this but she has difficulty reaching her shoulders with Voltaren gel -We will continue Voltaren gel as needed  -Patient is also on colchicine for pseudogout and I suspect this may help with some of the inflammation in her shoulders -We will continue to monitor closely.

## 2019-12-11 NOTE — Assessment & Plan Note (Addendum)
-  Patient states that she is going to call and schedule for Covid vaccine -I explained to the patient benefits of getting flu vaccine as well as the side effects from the vaccine and strongly encouraged her to get the Covid vaccine as she is at high risk if she gets infected by Covid -Patient expressed understanding and is in agreement with plan

## 2019-12-11 NOTE — Assessment & Plan Note (Signed)
-  This problem is chronic and stable -Patient is down approximately 2 pounds since her last visit -She continues to walk 1 mile a day.  I emphasized importance of continued exercise as well as following a good diet -We will discuss this in detail at her next visit

## 2019-12-11 NOTE — Progress Notes (Signed)
   Subjective:    Patient ID: Christine [name], female    DOB: Jul 29, 1936, 84 y.o.   MRN: [id number]  HPI  I have seen and examined this patient.  Patient is here for routine follow-up of her hypertension and prediabetes.  Patient states that she has been feeling tired and that her joints including her fingers and shoulders have been hurting over the last month.  She states that she has been compliant with her medications but did not know that she had to take her colchicine every day and has not taken it in a while.  She also states that she had a long walk to the office from the car and that she felt a little tired.  She denies any other complaints at this time.  Review of Systems  Constitutional: Positive for fatigue. Negative for activity change, appetite change and chills.  HENT: Negative.   Respiratory: Negative.   Cardiovascular: Negative.   Gastrointestinal: Negative.   Musculoskeletal: Positive for arthralgias.  Skin: Negative.   Neurological: Negative.   Psychiatric/Behavioral: Negative.        Objective:   Physical Exam Vitals reviewed.  Constitutional:      Appearance: Normal appearance.  HENT:     Head: Normocephalic and atraumatic.  Cardiovascular:     Rate and Rhythm: Normal rate and regular rhythm.     Heart sounds: Normal heart sounds.  Pulmonary:     Effort: Pulmonary effort is normal.     Breath sounds: Normal breath sounds. No wheezing or rales.  Abdominal:     General: Bowel sounds are normal. There is no distension.     Palpations: Abdomen is soft.     Tenderness: There is no abdominal tenderness.  Musculoskeletal:        General: No swelling or tenderness.     Cervical back: Neck supple.  Lymphadenopathy:     Cervical: No cervical adenopathy.  Neurological:     General: No focal deficit present.     Mental Status: She is alert and oriented to person, place, and time.  Psychiatric:        Mood and Affect: Mood normal.        Behavior: Behavior  normal.           Assessment & Plan:  Please see problem based charting for assessment and plan:

## 2019-12-11 NOTE — Assessment & Plan Note (Signed)
-  This problem is chronic and stable -Patient has bilateral chronic knee pain but her symptoms are well controlled with Voltaren gel -Patient was advised continue placement by her orthopedic surgeon but needs to lose weight prior to this -Patient is continuing to walk (walks 1 mile a day) and attempt lose weight before her surgery. -No further work-up at this time.  We will continue with Voltaren gel as needed

## 2019-12-11 NOTE — Assessment & Plan Note (Signed)
-  Patient is complaining of pain in both her shoulders today and states that she has had worsening pain over the last month. -She does state the Voltaren gel helps with this but she has difficulty reaching her shoulders with Voltaren gel -We will continue Voltaren gel as needed  -Patient is also on colchicine for pseudogout and I suspect this may help with some of the inflammation in her shoulders -We will continue to monitor closely.

## 2019-12-11 NOTE — Assessment & Plan Note (Signed)
-  This plan was chronic and stable -Patient follows up with rheumatology (Dr. Patrecia Pour) and saw her in January -At that time her pain from her pseudogout and OA was well controlled on colchicine daily. -Patient has not been taking this over the last month as she did not know that she was placed to take this every day -I encouraged the patient to restart her colchicine and she will do this when she goes home -No further work-up at this time

## 2019-12-11 NOTE — Assessment & Plan Note (Signed)
-  This problem is chronic and stable -Patient has a history of multinodular goiter status post lobectomy November 2004 -Patient is complaining of some fatigue over the last month but this may be from not taking her colchicine and having generalized joint pains -We will recheck TSH today -No further work-up at this time

## 2019-12-11 NOTE — Assessment & Plan Note (Signed)
BP Readings from Last 3 Encounters:  12/11/19 (!) 142/71  08/14/19 (!) 120/58  05/28/19 (!) 151/74    Lab Results  Component Value Date   NA 138 05/15/2019   K 4.1 05/15/2019   CREATININE 0.99 05/15/2019    Assessment: Blood pressure control:  Fair Progress toward BP goal:   Deteriorated Comments: Patient states that she is compliant with her spironolactone 25 mg and amlodipine 5 mg daily.  She did state that she had a long walk here and that she was tired at the end of that walk.  I suspect that this may be contributing to her mildly elevated blood pressure.  We will recheck this today  Plan: Medications:  continue current medications Educational resources provided:   Self management tools provided:   Other plans: We will recheck her blood pressure today.  Patient is also been complaining of fatigue and tiredness.  We will check a CBC as well

## 2019-12-11 NOTE — Assessment & Plan Note (Signed)
-  This problem is chronic and stable -Patient is compliant with Lipitor 40 mg daily -We will recheck lipid panel today -No further work-up for now

## 2019-12-12 ENCOUNTER — Encounter: Payer: Self-pay | Admitting: Internal Medicine

## 2019-12-12 ENCOUNTER — Telehealth: Payer: Self-pay | Admitting: Internal Medicine

## 2019-12-12 LAB — CMP14 + ANION GAP
ALT: 13 IU/L (ref 0–32)
AST: 19 IU/L (ref 0–40)
Albumin/Globulin Ratio: 1.2 (ref 1.2–2.2)
Albumin: 4.4 g/dL (ref 3.6–4.6)
Alkaline Phosphatase: 127 IU/L — ABNORMAL HIGH (ref 39–117)
Anion Gap: 17 mmol/L (ref 10.0–18.0)
BUN/Creatinine Ratio: 21 (ref 12–28)
BUN: 18 mg/dL (ref 8–27)
Bilirubin Total: 0.4 mg/dL (ref 0.0–1.2)
CO2: 23 mmol/L (ref 20–29)
Calcium: 9.5 mg/dL (ref 8.7–10.3)
Chloride: 101 mmol/L (ref 96–106)
Creatinine, Ser: 0.86 mg/dL (ref 0.57–1.00)
GFR calc Af Amer: 73 mL/min/{1.73_m2} (ref >59)
GFR calc non Af Amer: 64 mL/min/{1.73_m2} (ref >59)
Globulin, Total: 3.6 g/dL (ref 1.5–4.5)
Glucose: 134 mg/dL — ABNORMAL HIGH (ref 65–99)
Potassium: 4.6 mmol/L (ref 3.5–5.2)
Sodium: 141 mmol/L (ref 134–144)
Total Protein: 8 g/dL (ref 6.0–8.5)

## 2019-12-12 LAB — CBC WITH DIFFERENTIAL/PLATELET
Basophils Absolute: 0 10*3/uL (ref 0.0–0.2)
Basos: 0 %
EOS (ABSOLUTE): 0 10*3/uL (ref 0.0–0.4)
Eos: 1 %
Hematocrit: 38.8 % (ref 34.0–46.6)
Hemoglobin: 12.9 g/dL (ref 11.1–15.9)
Immature Grans (Abs): 0 10*3/uL (ref 0.0–0.1)
Immature Granulocytes: 0 %
Lymphocytes Absolute: 1.4 10*3/uL (ref 0.7–3.1)
Lymphs: 21 %
MCH: 27.9 pg (ref 26.6–33.0)
MCHC: 33.2 g/dL (ref 31.5–35.7)
MCV: 84 fL (ref 79–97)
Monocytes Absolute: 0.3 10*3/uL (ref 0.1–0.9)
Monocytes: 5 %
Neutrophils Absolute: 4.8 10*3/uL (ref 1.4–7.0)
Neutrophils: 73 %
Platelets: 180 10*3/uL (ref 150–450)
RBC: 4.63 x10E6/uL (ref 3.77–5.28)
RDW: 14.2 % (ref 11.7–15.4)
WBC: 6.6 10*3/uL (ref 3.4–10.8)

## 2019-12-12 LAB — LIPID PANEL
Chol/HDL Ratio: 3.6 ratio (ref 0.0–4.4)
Cholesterol, Total: 177 mg/dL (ref 100–199)
HDL: 49 mg/dL (ref >39)
LDL Chol Calc (NIH): 109 mg/dL — ABNORMAL HIGH (ref 0–99)
Triglycerides: 107 mg/dL (ref 0–149)
VLDL Cholesterol Cal: 19 mg/dL (ref 5–40)

## 2019-12-12 LAB — TSH: TSH: 0.987 u[IU]/mL (ref 0.450–4.500)

## 2019-12-12 NOTE — Telephone Encounter (Signed)
Pt missed call [phone]

## 2019-12-12 NOTE — Telephone Encounter (Signed)
I was. I tried calling her back after she called but she did not pick up. It is not urgent but if she does call back please forward her to me. Thank you!

## 2019-12-12 NOTE — Telephone Encounter (Signed)
I called the patient to discuss the results of her blood work with her.  Patient was noted to have a mildly elevated A1c of 6.9.  I explained to the patient that she is in the diabetic range now but she is still at goal.  I explained to the patient that we could start a medication for this but I was in favor of the patient trying to lose weight and watch her diet over the next couple of months and we will recheck this at her follow-up.  Patient presses understanding and is in agreement with plan.  Patient CBC was within normal limits and her lipid profile showed only mildly elevated LDL of 109.  Patient's TSH and BMP are within normal limits.  Patient was noted to have a mildly elevated alk phos on LFTs but I do not believe that further work-up is required at this point.  We will recheck this at her follow-up visit as well.  No further work-up for now.  Patient expressed understanding and is in agreement with plan.

## 2019-12-17 ENCOUNTER — Telehealth: Payer: Self-pay | Admitting: Licensed Clinical Social Worker

## 2019-12-17 NOTE — Telephone Encounter (Signed)
Patient was called to discuss the referral. Patient did not answer, and a vm was left for her to call the office to set up an appointment with me in the future.

## 2019-12-18 ENCOUNTER — Encounter: Payer: Self-pay | Admitting: Licensed Clinical Social Worker

## 2020-01-02 ENCOUNTER — Encounter: Payer: Self-pay | Admitting: *Deleted

## 2020-01-03 ENCOUNTER — Other Ambulatory Visit: Payer: Self-pay | Admitting: Internal Medicine

## 2020-01-09 ENCOUNTER — Telehealth: Payer: Self-pay

## 2020-01-09 NOTE — Telephone Encounter (Signed)
RTC, pt asking if she can get the Covid vaccine.  Pt states she had a severe allergic reaction to the influenza vaccine.  She describes her reaction as "hives, itching, coughing, difficulty breathing and had to stay overnight in the hospital".  Will forward to PCP to advise. Thank you, SChaplin, RN,BSN

## 2020-01-09 NOTE — Telephone Encounter (Signed)
I would still advise her to get the Covid vaccine. They will also screen her at the time of vaccination and she can tell them her reaction to the flu vaccine

## 2020-01-09 NOTE — Telephone Encounter (Signed)
Requesting to speak with a nurse about COVID injection. Please call back.

## 2020-01-09 NOTE — Telephone Encounter (Signed)
RTC to patient and advised of Dr. Wilber Bihari recommendations to get the Covid vaccine and to notify vaccination screeners of her reaction to the flu vaccine, she verbalized understanding.  Covid-19 vaccination site phone number provided to patient. SChaplin, RN,BSN

## 2020-01-12 ENCOUNTER — Other Ambulatory Visit: Payer: Self-pay | Admitting: Internal Medicine

## 2020-01-12 DIAGNOSIS — I10 Essential (primary) hypertension: Secondary | ICD-10-CM

## 2020-01-15 ENCOUNTER — Ambulatory Visit: Payer: Medicare Other | Admitting: Licensed Clinical Social Worker

## 2020-01-24 ENCOUNTER — Other Ambulatory Visit: Payer: Self-pay | Admitting: Internal Medicine

## 2020-01-24 DIAGNOSIS — I10 Essential (primary) hypertension: Secondary | ICD-10-CM

## 2020-01-30 ENCOUNTER — Telehealth: Payer: Self-pay | Admitting: Licensed Clinical Social Worker

## 2020-01-30 ENCOUNTER — Other Ambulatory Visit: Payer: Self-pay

## 2020-01-30 ENCOUNTER — Ambulatory Visit (INDEPENDENT_AMBULATORY_CARE_PROVIDER_SITE_OTHER): Payer: Medicare Other | Admitting: Licensed Clinical Social Worker

## 2020-01-30 ENCOUNTER — Encounter: Payer: Self-pay | Admitting: Licensed Clinical Social Worker

## 2020-01-30 DIAGNOSIS — F329 Major depressive disorder, single episode, unspecified: Secondary | ICD-10-CM

## 2020-01-30 DIAGNOSIS — F32A Depression, unspecified: Secondary | ICD-10-CM

## 2020-01-30 NOTE — Telephone Encounter (Signed)
Pt is returning your call [phone]

## 2020-01-30 NOTE — BH Specialist Note (Signed)
Integrated Behavioral Health Visit via Telemedicine (Telephone)  01/30/2020 PARADISE HECKSEL [id number]   Session Start time: 9:02  Session End time: 9:30 Total time: 28 minutes  Referring Provider: Dr. Dareen Piano Type of Visit: Telephonic Patient location: Home [facility] Provider location: Office All persons participating in visit: [facility] and patient  Confirmed patient's address: Yes  Confirmed patient's phone number: Yes  Any changes to demographics: No   Discussed confidentiality: Yes    The following statements were read to the patient and/or legal guardian that are established with the [facility] Provider.  "The purpose of this phone visit is to provide behavioral health care while limiting exposure to the coronavirus (COVID19).  There is a possibility of technology failure and discussed alternative modes of communication if that failure occurs."  "By engaging in this telephone visit, you consent to the provision of healthcare.  Additionally, you authorize for your insurance to be billed for the services provided during this telephone visit."   Patient and/or legal guardian consented to telephone visit: Yes   PRESENTING CONCERNS: Patient and/or family reports the following symptoms/concerns: loneliness, grief, sadness. Duration of problem: September, 2020; Severity of problem: mild  GOALS ADDRESSED: Patient will: 1.  Reduce symptoms of: agitation, depression and grief.  2.  Increase knowledge and/or ability of: coping skills and healthy habits  3.  Demonstrate ability to: Increase healthy adjustment to current life circumstances, Increase adequate support systems for patient/family and Begin healthy grieving over loss  INTERVENTIONS: Interventions utilized:  Brief CBT and Supportive Counseling Standardized Assessments completed: Not Needed  ASSESSMENT: Patient currently experiencing mild levels of depression and grief. Patient is having challenges dealing with  loneliness. Patient is having continuing to grieve the loss of her son from September, 2020. Patient also lost her sister earlier this year. Patient is the last remaining of her family, and feels she cannot burden her children with her sad thoughts. Patient identified that she can become agitated when overwhelmed, and finds herself eating unhealthy foods.   Patient may benefit from counseling.  PLAN: 1. Follow up with behavioral health clinician on : two weeks.   Dessie Coma, [facility], [place]

## 2020-02-12 ENCOUNTER — Encounter: Payer: Self-pay | Admitting: Licensed Clinical Social Worker

## 2020-02-12 ENCOUNTER — Other Ambulatory Visit: Payer: Self-pay

## 2020-02-12 ENCOUNTER — Ambulatory Visit (INDEPENDENT_AMBULATORY_CARE_PROVIDER_SITE_OTHER): Payer: Medicare Other | Admitting: Licensed Clinical Social Worker

## 2020-02-12 DIAGNOSIS — F32A Depression, unspecified: Secondary | ICD-10-CM

## 2020-02-12 DIAGNOSIS — F329 Major depressive disorder, single episode, unspecified: Secondary | ICD-10-CM

## 2020-02-12 NOTE — BH Specialist Note (Signed)
Integrated Behavioral Health Visit via Telemedicine (Telephone)  02/12/2020 Christine [name] [id number]   Session Start time: 10:33  Session End time: 10:50 Total time: 17 minutes  Referring Provider: Dr. Dareen Piano Type of Visit: Telephonic Patient location: Home [facility] Provider location: Office All persons participating in visit: Patient and [facility]  Confirmed patient's address: Yes  Confirmed patient's phone number: Yes  Any changes to demographics: No   Discussed confidentiality: Yes    The following statements were read to the patient and/or legal guardian that are established with the [facility] Provider.  "The purpose of this phone visit is to provide behavioral health care while limiting exposure to the coronavirus (COVID19).  There is a possibility of technology failure and discussed alternative modes of communication if that failure occurs."  "By engaging in this telephone visit, you consent to the provision of healthcare.  Additionally, you authorize for your insurance to be billed for the services provided during this telephone visit."   Patient and/or legal guardian consented to telephone visit: Yes   PRESENTING CONCERNS: Patient and/or family reports the following symptoms/concerns: loneliness, grief, sadness. Duration of problem: September 2020; Severity of problem: mild  GOALS ADDRESSED: Patient will: 1.  Reduce symptoms of: agitation, depression and grief.  2.  Increase knowledge and/or ability of: coping skills and healthy habits  3.  Demonstrate ability to: Increase healthy adjustment to current life circumstances, Increase adequate support systems for patient/family and Begin healthy grieving over loss  INTERVENTIONS: Interventions utilized:  Brief CBT and Supportive Counseling Standardized Assessments completed: Not Needed  ASSESSMENT: Patient currently experiencing mild levels of depression and grief. Patient reported a mild improvement in her grief.  Patient is trying to focus on happy memories of her son. Patient was in a more positive mood when compared to the previous session. Patient reported having a safe person outside of her family to discuss her grief with was helpful. Patient is isolated at times due to the precautions of the virus.    Patient may benefit from counseling.  PLAN: 1. Follow up with behavioral health clinician on : two and three weeks. Dessie Coma, [facility], [place]
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
## 2020-03-04 ENCOUNTER — Encounter: Payer: Self-pay | Admitting: Internal Medicine

## 2020-03-04 ENCOUNTER — Ambulatory Visit (INDEPENDENT_AMBULATORY_CARE_PROVIDER_SITE_OTHER): Payer: Medicare Other | Admitting: Licensed Clinical Social Worker

## 2020-03-04 ENCOUNTER — Other Ambulatory Visit: Payer: Self-pay

## 2020-03-04 ENCOUNTER — Encounter: Payer: Self-pay | Admitting: Licensed Clinical Social Worker

## 2020-03-04 ENCOUNTER — Ambulatory Visit (INDEPENDENT_AMBULATORY_CARE_PROVIDER_SITE_OTHER): Payer: Medicare Other | Admitting: Internal Medicine

## 2020-03-04 VITALS — BP 135/57 | HR 99 | Temp 97.9°F | Ht 68.0 in | Wt 295.0 lb

## 2020-03-04 DIAGNOSIS — M19011 Primary osteoarthritis, right shoulder: Secondary | ICD-10-CM | POA: Diagnosis not present

## 2020-03-04 DIAGNOSIS — Z6841 Body Mass Index (BMI) 40.0 and over, adult: Secondary | ICD-10-CM

## 2020-03-04 DIAGNOSIS — Z0000 Encounter for general adult medical examination without abnormal findings: Secondary | ICD-10-CM

## 2020-03-04 DIAGNOSIS — E118 Type 2 diabetes mellitus with unspecified complications: Secondary | ICD-10-CM | POA: Diagnosis not present

## 2020-03-04 DIAGNOSIS — M1711 Unilateral primary osteoarthritis, right knee: Secondary | ICD-10-CM | POA: Diagnosis not present

## 2020-03-04 DIAGNOSIS — I10 Essential (primary) hypertension: Secondary | ICD-10-CM | POA: Diagnosis not present

## 2020-03-04 DIAGNOSIS — R7303 Prediabetes: Secondary | ICD-10-CM

## 2020-03-04 DIAGNOSIS — M1120 Other chondrocalcinosis, unspecified site: Secondary | ICD-10-CM | POA: Diagnosis not present

## 2020-03-04 DIAGNOSIS — F4321 Adjustment disorder with depressed mood: Secondary | ICD-10-CM

## 2020-03-04 DIAGNOSIS — E1169 Type 2 diabetes mellitus with other specified complication: Secondary | ICD-10-CM

## 2020-03-04 DIAGNOSIS — F32A Depression, unspecified: Secondary | ICD-10-CM

## 2020-03-04 DIAGNOSIS — F329 Major depressive disorder, single episode, unspecified: Secondary | ICD-10-CM | POA: Diagnosis not present

## 2020-03-04 LAB — POCT GLYCOSYLATED HEMOGLOBIN (HGB A1C): Hemoglobin A1C: 7.1 % — AB (ref 4.0–5.6)

## 2020-03-04 LAB — GLUCOSE, CAPILLARY: Glucose-Capillary: 163 mg/dL — ABNORMAL HIGH (ref 70–99)

## 2020-03-04 NOTE — Patient Instructions (Signed)
-  It was a pleasure seeing you today -Your A1c is 7.1 which puts you in the diabetic range.  Please watch your diet and continued exercise and we will recheck this in 3 months.  If this remains elevated we will start you on a medication for your diabetes. -We will give you information on the Covid vaccine.  The Covid vaccine helps to prevent infection from Covid and also protects people from getting severe Covid infections in if they do get infected.  I highly recommend getting the Covid vaccine as soon as possible. -Your blood pressure is well controlled.  Keep up the good work -Please follow-up with me in 3 months

## 2020-03-04 NOTE — Assessment & Plan Note (Signed)
-  This problem is chronic and stable -Patient's weight has been approximately the same over the last couple of months -We set a weight loss goal of 5 pounds over the next 3 months -I emphasized importance of diet and exercise with her. -No further work-up at this time

## 2020-03-04 NOTE — Assessment & Plan Note (Signed)
-  This problem is chronic and stable -Patient has bilateral chronic knee pain and was advised knee replacement by her orthopedic surgeon but needs to lose weight prior to this -We talked about the importance of weight loss and patient states that she is having issues with her diet as she eats a lot when she is depressed and states that her depression is not well controlled. -Patient states that she will work on her diet and exercise and will continue Voltaren gel for now for pain control.

## 2020-03-04 NOTE — Assessment & Plan Note (Signed)
-  Patient complains of pain in bilateral shoulders which has been chronic -Patient states the pain is controlled on colchicine and Voltaren gel -Patient is on colchicine for pseudogout by her rheumatologist -We will continue current regimen for now -No further work-up at this time

## 2020-03-04 NOTE — Assessment & Plan Note (Signed)
-  This problem is chronic and stable -Patient follows up with her rheumatologist (Dr. Patrecia Pour) as an outpatient -Patient was started on colchicine daily for pseudogout as well as her OA -Patient states that this helps her with her chronic pain -No further work-up at this time

## 2020-03-04 NOTE — Assessment & Plan Note (Addendum)
-  This problem is chronic and worsening -Patient's A1c today is 7.1 -Patient has been in the diabetic range over the last 2 A1c's -We talked about the possibility of adding Metformin for her new diabetes.  Patient is very hesitant to start medication at this time and wants to work on her diet and exercise prior to initiating medication -Since patient's A1c is 7.1 I think that diet and exercise is a reasonable option at this time -If her A1c remains elevated at her follow-up visit we will initiate Metformin - Will refer to diabetes educator -Patient will also need foot exam, referral to ophthalmology for an eye exam as well at her next visit -No further work-up for now

## 2020-03-04 NOTE — Progress Notes (Signed)
   Subjective:    Patient ID: Christine [name], female    DOB: 03/10/1936, 84 y.o.   MRN: [id number]  HPI  I have seen and examined this patient.  Patient is here for routine follow-up of her hypertension and borderline diabetes.  Patient complains of chronic pain in her shoulders but otherwise has no new complaints today.  Patient states that she is compliant with her medications.  Review of Systems  Constitutional: Negative.   HENT: Negative.   Respiratory: Negative.   Cardiovascular: Positive for leg swelling.       Complains of mild swelling in bilateral ankles worse on the right  Gastrointestinal: Negative.   Musculoskeletal: Positive for arthralgias.  Neurological: Negative.   Psychiatric/Behavioral:       Patient states that she eats a lot when she is depressed and states that her depression is not well controlled currently       Objective:   Physical Exam Constitutional:      Appearance: Normal appearance.  HENT:     Head: Normocephalic and atraumatic.  Cardiovascular:     Rate and Rhythm: Normal rate and regular rhythm.     Heart sounds: Normal heart sounds.  Pulmonary:     Effort: Pulmonary effort is normal.     Breath sounds: Normal breath sounds. No wheezing or rales.  Abdominal:     General: Bowel sounds are normal. There is no distension.     Palpations: Abdomen is soft.     Tenderness: There is no abdominal tenderness.  Musculoskeletal:        General: Swelling present. No tenderness.     Cervical back: Neck supple.     Comments: Trace bilateral ankle swelling worse on the right  Lymphadenopathy:     Cervical: No cervical adenopathy.  Neurological:     Mental Status: She is alert and oriented to person, place, and time.  Psychiatric:        Mood and Affect: Mood normal.        Behavior: Behavior normal.           Assessment & Plan:  Please see problem based charting for assessment and plan:

## 2020-03-04 NOTE — Assessment & Plan Note (Signed)
-  We spent a lot of the appointment talking with the risks and benefits of the Covid vaccine.  Explained to the patient that given her comorbidities she is at high risk for developing a severe infection from Covid and highly recommend that she gets the Covid vaccine -Information was given to the patient with the Covid vaccine today and she states that she will get the vaccine -No further work-up at this time

## 2020-03-04 NOTE — BH Specialist Note (Signed)
Integrated Behavioral Health Visit via Telemedicine (Telephone)  03/04/2020 Christine [name] [id number]   Session Start time: 11:30  Session End time: 11:50 Total time: 20 minutes  Referring Provider: Dr. Dareen Piano Type of Visit: Telephonic Patient location: Home [facility] Provider location: Office All persons participating in visit: Patient and [facility]  Confirmed patient's address: Yes  Confirmed patient's phone number: Yes  Any changes to demographics: No   Discussed confidentiality: Yes    The following statements were read to the patient and/or legal guardian that are established with the [facility] Provider.  "The purpose of this phone visit is to provide behavioral health care while limiting exposure to the coronavirus (COVID19).  There is a possibility of technology failure and discussed alternative modes of communication if that failure occurs."  "By engaging in this telephone visit, you consent to the provision of healthcare.  Additionally, you authorize for your insurance to be billed for the services provided during this telephone visit."   Patient and/or legal guardian consented to telephone visit: Yes   PRESENTING CONCERNS: Patient and/or family reports the following symptoms/concerns: loneliness, grief, sadness. Duration of problem: September 2020; Severity of problem: mild  GOALS ADDRESSED: Patient will: 1.  Reduce symptoms of: depression and grief.  2.  Increase knowledge and/or ability of: coping skills, healthy habits and stress reduction  3.  Demonstrate ability to: Increase healthy adjustment to current life circumstances, Increase adequate support systems for patient/family and Begin healthy grieving over loss  INTERVENTIONS: Interventions utilized:  Mindfulness or Relaxation Training and Supportive Counseling Standardized Assessments completed: Not Needed  ASSESSMENT: Patient currently experiencing mild levels of depression and grief. Patient reported  that she is trying to focus on happy memories of her son, but she has challenges at times. Patient did become tearful throughout out session, and apologized for her emotions. Patient was encouraged not to apologize, and allow herself to grieve. Patient continues to be cautious with not talking too often about her son to her other children. Patient expressed thoughts/concerns about Covid and the vaccine.    Patient may benefit from counseling.  PLAN: 1. Follow up with behavioral health clinician on : three weeks.   Dessie Coma, [facility], [place]

## 2020-03-04 NOTE — Assessment & Plan Note (Signed)
BP Readings from Last 3 Encounters:  03/04/20 (!) 135/57  12/11/19 (!) 142/71  08/14/19 (!) 120/58    Lab Results  Component Value Date   NA 141 12/11/2019   K 4.6 12/11/2019   CREATININE 0.86 12/11/2019    Assessment: Blood pressure control:  Well-controlled Progress toward BP goal:   At goal Comments: Patient is compliant with spironolactone 25 mg daily as well as amlodipine 5 mg daily  Plan: Medications:  continue current medications Educational resources provided:   Self management tools provided:   Other plans: We discussed the importance of weight loss today and that this would help with her hypertension.  We will also check a BMP at her next visit.

## 2020-03-04 NOTE — Assessment & Plan Note (Signed)
-  This problem is chronic and worsening -Patient has been having intermittent episodes of depression since her son passed away -Patient has been talking with [name] and states that this has been helping her deal with her grief -Patient PHQ-9 score has worsened to 19 today -I suspect patient would benefit from beginning antidepressants but she does not want to start new medication at this time. -If patient has no improvement in her PHQ-9 at her follow-up visit I would recommend starting her on an SSRI

## 2020-03-04 NOTE — Assessment & Plan Note (Addendum)
>>  ASSESSMENT AND PLAN FOR OSTEOARTHRITIS WRITTEN ON 06/01/2023  8:55 AM BY Mercie Eon, MD  >>ASSESSMENT AND PLAN FOR OSTEOARTHRITIS OF RIGHT KNEE WRITTEN ON 03/04/2020  9:27 AM BY NARENDRA, NISCHAL, MD  -This problem is chronic and stable -Patient has bilateral chronic knee pain and was advised knee replacement by her orthopedic surgeon but needs to lose weight prior to this -We talked about the importance of weight loss and patient states that she is having issues with her diet as she eats a lot when she is depressed and states that her depression is not well controlled. -Patient states that she will work on her diet and exercise and will continue Voltaren gel for now for pain control.  >>ASSESSMENT AND PLAN FOR DEGENERATIVE JOINT DISEASE OF SHOULDER, RIGHT WRITTEN ON 03/04/2020  9:28 AM BY NARENDRA, NISCHAL, MD  -Patient complains of pain in bilateral shoulders which has been chronic -Patient states the pain is controlled on colchicine and Voltaren gel -Patient is on colchicine for pseudogout by her rheumatologist -We will continue current regimen for now -No further work-up at this time

## 2020-03-05 NOTE — Addendum Note (Signed)
Addended by: Aldine Contes on: 03/05/2020 08:36 AM   Modules accepted: Orders

## 2020-03-14 ENCOUNTER — Other Ambulatory Visit: Payer: Self-pay

## 2020-03-14 ENCOUNTER — Telehealth: Payer: Self-pay | Admitting: Dietician

## 2020-03-14 ENCOUNTER — Ambulatory Visit (INDEPENDENT_AMBULATORY_CARE_PROVIDER_SITE_OTHER): Payer: Medicare Other | Admitting: Physician Assistant

## 2020-03-14 ENCOUNTER — Encounter: Payer: Self-pay | Admitting: Physician Assistant

## 2020-03-14 VITALS — BP 153/75 | HR 89 | Resp 16 | Ht 68.25 in | Wt 293.0 lb

## 2020-03-14 DIAGNOSIS — Z8719 Personal history of other diseases of the digestive system: Secondary | ICD-10-CM

## 2020-03-14 DIAGNOSIS — M1120 Other chondrocalcinosis, unspecified site: Secondary | ICD-10-CM | POA: Diagnosis not present

## 2020-03-14 DIAGNOSIS — M19041 Primary osteoarthritis, right hand: Secondary | ICD-10-CM | POA: Diagnosis not present

## 2020-03-14 DIAGNOSIS — G8929 Other chronic pain: Secondary | ICD-10-CM

## 2020-03-14 DIAGNOSIS — M25512 Pain in left shoulder: Secondary | ICD-10-CM

## 2020-03-14 DIAGNOSIS — Z8639 Personal history of other endocrine, nutritional and metabolic disease: Secondary | ICD-10-CM

## 2020-03-14 DIAGNOSIS — R7303 Prediabetes: Secondary | ICD-10-CM

## 2020-03-14 DIAGNOSIS — Z8679 Personal history of other diseases of the circulatory system: Secondary | ICD-10-CM | POA: Diagnosis not present

## 2020-03-14 DIAGNOSIS — M19042 Primary osteoarthritis, left hand: Secondary | ICD-10-CM

## 2020-03-14 DIAGNOSIS — M25511 Pain in right shoulder: Secondary | ICD-10-CM

## 2020-03-14 DIAGNOSIS — M7061 Trochanteric bursitis, right hip: Secondary | ICD-10-CM | POA: Diagnosis not present

## 2020-03-14 DIAGNOSIS — M1711 Unilateral primary osteoarthritis, right knee: Secondary | ICD-10-CM

## 2020-03-14 DIAGNOSIS — L409 Psoriasis, unspecified: Secondary | ICD-10-CM

## 2020-03-14 MED ORDER — DICLOFENAC SODIUM 1 % EX GEL: CUTANEOUS | 4 refills | Status: DC

## 2020-03-14 MED ORDER — COLCHICINE 0.6 MG PO TABS: 0.6000 mg | ORAL_TABLET | Freq: Every day | ORAL | 0 refills | Status: DC

## 2020-03-14 NOTE — Telephone Encounter (Signed)
She called to schedule an appointment. I told her the front office has to do this. She verbalized understanding and has not questions at this time.

## 2020-03-14 NOTE — Patient Instructions (Signed)
Shoulder Exercises Ask your health care provider which exercises are safe for you. Do exercises exactly as told by your health care provider and adjust them as directed. It is normal to feel mild stretching, pulling, tightness, or discomfort as you do these exercises. Stop right away if you feel sudden pain or your pain gets worse. Do not begin these exercises until told by your health care provider. Stretching exercises External rotation and abduction This exercise is sometimes called corner stretch. This exercise rotates your arm outward (external rotation) and moves your arm out from your body (abduction). 1. Stand in a doorway with one of your feet slightly in front of the other. This is called a staggered stance. If you cannot reach your forearms to the door frame, stand facing a corner of a room. 2. Choose one of the following positions as told by your health care provider: ? Place your hands and forearms on the door frame above your head. ? Place your hands and forearms on the door frame at the height of your head. ? Place your hands on the door frame at the height of your elbows. 3. Slowly move your weight onto your front foot until you feel a stretch across your chest and in the front of your shoulders. Keep your head and chest upright and keep your abdominal muscles tight. 4. Hold for __________ seconds. 5. To release the stretch, shift your weight to your back foot. Repeat __________ times. Complete this exercise __________ times a day. Extension, standing 1. Stand and hold a broomstick, a cane, or a similar object behind your back. ? Your hands should be a little wider than shoulder width apart. ? Your palms should face away from your back. 2. Keeping your elbows straight and your shoulder muscles relaxed, move the stick away from your body until you feel a stretch in your shoulders (extension). ? Avoid shrugging your shoulders while you move the stick. Keep your shoulder blades tucked  down toward the middle of your back. 3. Hold for __________ seconds. 4. Slowly return to the starting position. Repeat __________ times. Complete this exercise __________ times a day. Range-of-motion exercises Pendulum  1. Stand near a wall or a surface that you can hold onto for balance. 2. Bend at the waist and let your left / right arm hang straight down. Use your other arm to support you. Keep your back straight and do not lock your knees. 3. Relax your left / right arm and shoulder muscles, and move your hips and your trunk so your left / right arm swings freely. Your arm should swing because of the motion of your body, not because you are using your arm or shoulder muscles. 4. Keep moving your hips and trunk so your arm swings in the following directions, as told by your health care provider: ? Side to side. ? Forward and backward. ? In clockwise and counterclockwise circles. 5. Continue each motion for __________ seconds, or for as long as told by your health care provider. 6. Slowly return to the starting position. Repeat __________ times. Complete this exercise __________ times a day. Shoulder flexion, standing  1. Stand and hold a broomstick, a cane, or a similar object. Place your hands a little more than shoulder width apart on the object. Your left / right hand should be palm up, and your other hand should be palm down. 2. Keep your elbow straight and your shoulder muscles relaxed. Push the stick up with your healthy arm to   raise your left / right arm in front of your body, and then over your head until you feel a stretch in your shoulder (flexion). ? Avoid shrugging your shoulder while you raise your arm. Keep your shoulder blade tucked down toward the middle of your back. 3. Hold for __________ seconds. 4. Slowly return to the starting position. Repeat __________ times. Complete this exercise __________ times a day. Shoulder abduction, standing 1. Stand and hold a broomstick,  a cane, or a similar object. Place your hands a little more than shoulder width apart on the object. Your left / right hand should be palm up, and your other hand should be palm down. 2. Keep your elbow straight and your shoulder muscles relaxed. Push the object across your body toward your left / right side. Raise your left / right arm to the side of your body (abduction) until you feel a stretch in your shoulder. ? Do not raise your arm above shoulder height unless your health care provider tells you to do that. ? If directed, raise your arm over your head. ? Avoid shrugging your shoulder while you raise your arm. Keep your shoulder blade tucked down toward the middle of your back. 3. Hold for __________ seconds. 4. Slowly return to the starting position. Repeat __________ times. Complete this exercise __________ times a day. Internal rotation  1. Place your left / right hand behind your back, palm up. 2. Use your other hand to dangle an exercise band, a towel, or a similar object over your shoulder. Grasp the band with your left / right hand so you are holding on to both ends. 3. Gently pull up on the band until you feel a stretch in the front of your left / right shoulder. The movement of your arm toward the center of your body is called internal rotation. ? Avoid shrugging your shoulder while you raise your arm. Keep your shoulder blade tucked down toward the middle of your back. 4. Hold for __________ seconds. 5. Release the stretch by letting go of the band and lowering your hands. Repeat __________ times. Complete this exercise __________ times a day. Strengthening exercises External rotation  1. Sit in a stable chair without armrests. 2. Secure an exercise band to a stable object at elbow height on your left / right side. 3. Place a soft object, such as a folded towel or a small pillow, between your left / right upper arm and your body to move your elbow about 4 inches (10 cm) away  from your side. 4. Hold the end of the exercise band so it is tight and there is no slack. 5. Keeping your elbow pressed against the soft object, slowly move your forearm out, away from your abdomen (external rotation). Keep your body steady so only your forearm moves. 6. Hold for __________ seconds. 7. Slowly return to the starting position. Repeat __________ times. Complete this exercise __________ times a day. Shoulder abduction  1. Sit in a stable chair without armrests, or stand up. 2. Hold a __________ weight in your left / right hand, or hold an exercise band with both hands. 3. Start with your arms straight down and your left / right palm facing in, toward your body. 4. Slowly lift your left / right hand out to your side (abduction). Do not lift your hand above shoulder height unless your health care provider tells you that this is safe. ? Keep your arms straight. ? Avoid shrugging your shoulder while you   do this movement. Keep your shoulder blade tucked down toward the middle of your back. 5. Hold for __________ seconds. 6. Slowly lower your arm, and return to the starting position. Repeat __________ times. Complete this exercise __________ times a day. Shoulder extension 1. Sit in a stable chair without armrests, or stand up. 2. Secure an exercise band to a stable object in front of you so it is at shoulder height. 3. Hold one end of the exercise band in each hand. Your palms should face each other. 4. Straighten your elbows and lift your hands up to shoulder height. 5. Step back, away from the secured end of the exercise band, until the band is tight and there is no slack. 6. Squeeze your shoulder blades together as you pull your hands down to the sides of your thighs (extension). Stop when your hands are straight down by your sides. Do not let your hands go behind your body. 7. Hold for __________ seconds. 8. Slowly return to the starting position. Repeat __________ times.  Complete this exercise __________ times a day. Shoulder row 1. Sit in a stable chair without armrests, or stand up. 2. Secure an exercise band to a stable object in front of you so it is at waist height. 3. Hold one end of the exercise band in each hand. Position your palms so that your thumbs are facing the ceiling (neutral position). 4. Bend each of your elbows to a 90-degree angle (right angle) and keep your upper arms at your sides. 5. Step back until the band is tight and there is no slack. 6. Slowly pull your elbows back behind you. 7. Hold for __________ seconds. 8. Slowly return to the starting position. Repeat __________ times. Complete this exercise __________ times a day. Shoulder press-ups  1. Sit in a stable chair that has armrests. Sit upright, with your feet flat on the floor. 2. Put your hands on the armrests so your elbows are bent and your fingers are pointing forward. Your hands should be about even with the sides of your body. 3. Push down on the armrests and use your arms to lift yourself off the chair. Straighten your elbows and lift yourself up as much as you comfortably can. ? Move your shoulder blades down, and avoid letting your shoulders move up toward your ears. ? Keep your feet on the ground. As you get stronger, your feet should support less of your body weight as you lift yourself up. 4. Hold for __________ seconds. 5. Slowly lower yourself back into the chair. Repeat __________ times. Complete this exercise __________ times a day. Wall push-ups  1. Stand so you are facing a stable wall. Your feet should be about one arm-length away from the wall. 2. Lean forward and place your palms on the wall at shoulder height. 3. Keep your feet flat on the floor as you bend your elbows and lean forward toward the wall. 4. Hold for __________ seconds. 5. Straighten your elbows to push yourself back to the starting position. Repeat __________ times. Complete this exercise  __________ times a day. This information is not intended to replace advice given to you by your health care provider. Make sure you discuss any questions you have with your health care provider. Document Revised: 01/19/2019 Document Reviewed: 10/27/2018 Elsevier Patient Education  2020 [employer]  

## 2020-03-17 NOTE — Telephone Encounter (Signed)
This patient has sch an appointment with you for 03/25/2020.

## 2020-03-18 ENCOUNTER — Telehealth: Payer: Self-pay | Admitting: *Deleted

## 2020-03-18 NOTE — Telephone Encounter (Signed)
Received notification from [facility] regarding a prior authorization for Voltaren Gel. Authorization has been APPROVED from 12/19/2019 to 03/18/2021.

## 2020-03-18 NOTE — Telephone Encounter (Addendum)
Submitted a Prior Authorization request to [facility] [place] for Voltaren Gel via Cover My Meds. Will update once we receive a response.

## 2020-03-20 ENCOUNTER — Telehealth: Payer: Self-pay | Admitting: *Deleted

## 2020-03-20 NOTE — Telephone Encounter (Signed)
Received a possible drug interaction from [place]. Possible interaction between Atorvastatin Calcium and Colchicine.  Reviewed by Hazel Sams, PA-C   Recommendation: She has been tolerating the current combination dose of colchicine. She can discuss alternatives to atorvastatin with PCP if she would like to. She has not has any signs of rhabdomyolysis.   Attempted to contact the patient and left message for patient to call the office.

## 2020-03-21 ENCOUNTER — Telehealth: Payer: Self-pay | Admitting: *Deleted

## 2020-03-21 NOTE — Telephone Encounter (Signed)
Pt calls with concern from pharmacist that colchicine and lipitor have undesirable outcomes when taken together. She states she is not going to take either until dr Dareen Piano tells her otherwise. Please advise

## 2020-03-21 NOTE — Telephone Encounter (Signed)
Patient returned call to the office and advised of recommendation.

## 2020-03-23 NOTE — Telephone Encounter (Signed)
Patient has been on these medications for awhile and even though there is increased risk for rhabdomyolysis she has been tolerating these medications well. If there is a concern for increased muscle pain I would advise her to hold the colchicine and lipitor and follow up in [facility] for blood work but she can continue with lipitor alone for now and contact her rheumatologist for further clarification on the colchicine. Thank you!

## 2020-03-24 NOTE — Telephone Encounter (Signed)
Attempted to call pt, no answer and no vmail

## 2020-03-25 ENCOUNTER — Ambulatory Visit (INDEPENDENT_AMBULATORY_CARE_PROVIDER_SITE_OTHER): Payer: Medicare Other | Admitting: Dietician

## 2020-03-25 ENCOUNTER — Encounter: Payer: Self-pay | Admitting: Dietician

## 2020-03-25 DIAGNOSIS — E119 Type 2 diabetes mellitus without complications: Secondary | ICD-10-CM

## 2020-03-25 LAB — GLUCOSE, CAPILLARY: Glucose-Capillary: 133 mg/dL — ABNORMAL HIGH (ref 70–99)

## 2020-03-25 NOTE — Progress Notes (Signed)
Diabetes Self-Management Education  Visit Type: First/Initial  Appt. Start Time: 1000 Appt. End Time: 1100  03/25/2020  Christine [name], identified by name and date of birth, is a 84 y.o. female with a diagnosis of Diabetes: Type 2.   ASSESSMENT  Patient denied knowing that she had diabetes today. She was shocked about her A1C and blood sugar. She is insterested in self monitoring at home but was too upset to learn that today. She also mentions recent deaths in her family that she is getting counseling for. She also would rather do telehealth because she is not vaccinated against the Covid-19. Plan is to see her once more then follow up via telehealth.    Weight 292 lb 1.6 oz (132.5 kg), last menstrual period 01/12/1972. Body mass index is 44.09 kg/m.   Diabetes Self-Management Education - 03/25/20 1100      Visit Information   Visit Type First/Initial      Initial Visit   Diabetes Type Type 2    Are you currently following a meal plan? No    Are you taking your medications as prescribed? Not on Medications    Date Diagnosed 5.25.21      Psychosocial Assessment   Patient Belief/Attitude about Diabetes Denial    Self-care barriers Lack of material resources;Other (comment)   recent loss   Self-management support Doctor's office;Family;CDE visits    Patient Concerns Nutrition/Meal planning;Weight Control    Special Needs Simplified materials    Preferred Learning Style No preference indicated    Learning Readiness Contemplating    How often do you need to have someone help you when you read instructions, pamphlets, or other written materials from your doctor or pharmacy? 2 - Rarely      Pre-Education Assessment   Patient understands incorporating nutritional management into lifestyle. Needs Instruction    Patient undertands incorporating physical activity into lifestyle. Needs Instruction    Patient understands monitoring blood glucose, interpreting and using results Needs  Instruction    Patient understands how to develop strategies to address psychosocial issues. Needs Instruction      Complications   Last HgB A1C per patient/outside source 7.1 %    How often do you check your blood sugar? Not recommended by provider      Dietary Intake   Breakfast 4am- coffee w/ 2% milk, 9am [place] sausage, eggs 1-2, 1-2 slices wheat toast    Dinner 4PM- cabbage, baked chicken, litle debbie      Exercise   Exercise Type ADL's;Light (walking / raking leaves)    How many days per week to you exercise? 4    How many minutes per day do you exercise? 30    Total minutes per week of exercise 120      Patient Education   Nutrition management  Role of diet in the treatment of diabetes and the relationship between the three main macronutrients and blood glucose level    Physical activity and exercise  Role of exercise on diabetes management, blood pressure control and cardiac health.    Monitoring Purpose and frequency of SMBG.;Identified appropriate SMBG and/or A1C goals.    Psychosocial adjustment Worked with patient to identify barriers to care and solutions;Identified and addressed patients feelings and concerns about diabetes      Individualized Goals (developed by patient)   Physical Activity Exercise 3-5 times per week      Outcomes   Expected Outcomes Demonstrated interest in learning. Expect positive outcomes    Future DMSE  2 wks    Program Status Not Completed           Individualized Plan for Diabetes Self-Management Training:   Learning Objective:  Patient will have a greater understanding of diabetes self-management. Patient education plan is to attend individual and/or group sessions per assessed needs and concerns.   Plan:   Patient Instructions  We talked about your diabetes today and how to take care of it.  You know what and how much to eat- no food is off limits- it is the portions that is important.   Keep up the walking!! Good job  We'll  talk more about checking your blood sugar at home at your next visit. It is optional- you donot have to do that.   [name] [phone]  Expected Outcomes:  Demonstrated interest in learning. Expect positive outcomes  Education material provided: ADA - How to Thrive: A Guide for Your Journey with Diabetes  If problems or questions, patient to contact team via:  Phone  Future DSME appointment: 2 wks  [name], RD 03/25/2020 11:21 AM.

## 2020-03-25 NOTE — Patient Instructions (Signed)
We talked about your diabetes today and how to take care of it.  You know what and how much to eat- no food is off limits- it is the portions that is important.   Keep up the walking!! Good job  We'll talk more about checking your blood sugar at home at your next visit. It is optional- you donot have to do that.   [name] [phone]

## 2020-03-26 ENCOUNTER — Other Ambulatory Visit: Payer: Self-pay

## 2020-03-26 ENCOUNTER — Encounter: Payer: Self-pay | Admitting: Licensed Clinical Social Worker

## 2020-03-26 ENCOUNTER — Ambulatory Visit (INDEPENDENT_AMBULATORY_CARE_PROVIDER_SITE_OTHER): Payer: Medicare Other | Admitting: Licensed Clinical Social Worker

## 2020-03-26 DIAGNOSIS — F4321 Adjustment disorder with depressed mood: Secondary | ICD-10-CM

## 2020-03-26 NOTE — BH Specialist Note (Signed)
Integrated Behavioral Health Visit via Telemedicine (Telephone)  03/26/2020 Christine [name] [id number]   Session Start time: 11:38  Session End time: 12:00 Total time: 22 minutes  Referring Provider: Dr. Dareen Piano Type of Visit: Telephonic Patient location: Home [facility] Provider location: Office All persons participating in visit: Patient and [facility]  Confirmed patient's address: Yes  Confirmed patient's phone number: Yes  Any changes to demographics: No   Discussed confidentiality: Yes    The following statements were read to the patient and/or legal guardian that are established with the [facility] Provider.  "The purpose of this phone visit is to provide behavioral health care while limiting exposure to the coronavirus (COVID19).  There is a possibility of technology failure and discussed alternative modes of communication if that failure occurs."  "By engaging in this telephone visit, you consent to the provision of healthcare.  Additionally, you authorize for your insurance to be billed for the services provided during this telephone visit."   Patient and/or legal guardian consented to telephone visit: Yes   PRESENTING CONCERNS: Patient and/or family reports the following symptoms/concerns: loneliness, grief, sadness. Duration of problem: September; Severity of problem: mild  GOALS ADDRESSED: Patient will: 1.  Reduce symptoms of: depression and grief.  2.  Increase knowledge and/or ability of: coping skills, healthy habits and stress reduction  3.  Demonstrate ability to: Increase healthy adjustment to current life circumstances, Increase adequate support systems for patient/family and Increase motivation to adhere to plan of care  INTERVENTIONS: Interventions utilized:  Motivational Interviewing, Brief CBT, Supportive Counseling and grief therapy. Standardized Assessments completed: Not Needed  ASSESSMENT: Patient currently experiencing mild levels of depression and  grief. Patient identified she has transitioned from a "10" to a "5" with her grief severity. Patient still has moments of sadness, and she discussed what the coping skills she implements to reduce sadness. Patient learned yesterday that she has Type II Diabetes, and she is in the contemplation stage of change. Patient processed her fears regarding change with diet, and sharing this information with her children.  Patient may benefit from counseling.  PLAN: 1. Follow up with behavioral health clinician on : two weeks. Dessie Coma, [facility], [place]

## 2020-04-08 ENCOUNTER — Encounter: Payer: Self-pay | Admitting: Dietician

## 2020-04-08 ENCOUNTER — Telehealth: Payer: Self-pay | Admitting: Dietician

## 2020-04-08 ENCOUNTER — Ambulatory Visit (INDEPENDENT_AMBULATORY_CARE_PROVIDER_SITE_OTHER): Payer: Medicare Other | Admitting: Dietician

## 2020-04-08 DIAGNOSIS — E119 Type 2 diabetes mellitus without complications: Secondary | ICD-10-CM

## 2020-04-08 MED ORDER — ONETOUCH VERIO VI STRP: ORAL_STRIP | 3 refills | Status: DC

## 2020-04-08 MED ORDER — ONETOUCH DELICA PLUS LANCET33G MISC: 3 refills | Status: DC

## 2020-04-08 NOTE — Patient Instructions (Addendum)
I recommend 1-2 tablespoons PSYLLIUM HUSK each day in 8 oz water or juice to keep your bowel healthy-  [facility]   [place] job with exercising.     You can call sit and be fit to ask when it is on and ask them to have it on more days of the week.  [phone]) [phone]  Cabbage Squash Onions Lettuce Tomatoes Carrots Beets  Brown rice is a whole grains- way to go!  Your blood sugar numbers:   fasting means first thing tin the morning after not eating for 8 hours (before breakfast is often fasting)  Should be Before eating 70-120  then goes up for about 2 hours to no higher than 180 mg/d;(200 is too high for everybody and all the time) Then it goes down for about 2 hours and then should stay about your fasting level ( 70-120).    Type 2 Diabetes Mellitus, Self Care, Adult When you have type 2 diabetes (type 2 diabetes mellitus), you must make sure your blood sugar (glucose) stays in a healthy range. You can do this with:  Nutrition.  Exercise.  Lifestyle changes.  Medicines or insulin, if needed.  Support from your doctors and others. How to stay aware of blood sugar   Check your blood sugar level every day, as often as told.  Have your A1c (hemoglobin A1c) level checked two or more times a year. Have it checked more often if your doctor tells you to. Your doctor will set personal treatment goals for you. Generally, you should have these blood sugar levels:  Before meals (preprandial): 80-130 mg/dL (4.4-7.2 mmol/L).  After meals (postprandial): below 180 mg/dL (10 mmol/L).  A1c level: less than 7%. How to manage high and low blood sugar Signs of high blood sugar High blood sugar is called hyperglycemia. Know the signs of high blood sugar. Signs may include:  Feeling: ? Thirsty. ? Hungry. ? Very tired.  Needing to pee (urinate) more than usual.  Blurry vision. Signs of low blood sugar Low blood sugar is called hypoglycemia. This is when blood sugar is  at or below 70 mg/dL (3.9 mmol/L). Signs may include:  Feeling: ? Hungry. ? Worried or nervous (anxious). ? Sweaty and clammy. ? Confused. ? Dizzy. ? Sleepy. ? Sick to your stomach (nauseous).  Having: ? A fast heartbeat. ? A headache. ? A change in your vision. ? Jerky movements that you cannot control (seizure). ? Tingling or no feeling (numbness) around your mouth, lips, or tongue.  Having trouble with: ? Moving (coordination). ? Sleeping. ? Passing out (fainting). ? Getting upset easily (irritability). Treating low blood sugar To treat low blood sugar, eat or drink something sugary right away. If you can think clearly and swallow safely, follow the 15:15 rule:  Take 15 grams of a fast-acting carb (carbohydrate). Talk with your doctor about how much you should take.  Some fast-acting carbs are: ? Sugar tablets (glucose pills). Take 3-4 pills. ? 6-8 pieces of hard candy. ? 4-6 oz (120-150 mL) of fruit juice. ? 4-6 oz (120-150 mL) of regular (not diet) soda. ? 1 Tbsp (15 mL) honey or sugar.  Check your blood sugar 15 minutes after you take the carb.  If your blood sugar is still at or below 70 mg/dL (3.9 mmol/L), take 15 grams of a carb again.  If your blood sugar does not go above 70 mg/dL (3.9 mmol/L) after 3 tries, get help right away.  After your  blood sugar goes back to normal, eat a meal or a snack within 1 hour. Treating very low blood sugar If your blood sugar is at or below 54 mg/dL (3 mmol/L), you have very low blood sugar (severe hypoglycemia). This is an emergency. Do not wait to see if the symptoms will go away. Get medical help right away. Call your local emergency services (911 in the [place] If you have very low blood sugar and you cannot eat or drink, you may need a glucagon shot (injection). A family member or friend should learn how to check your blood sugar and how to give you a glucagon shot. Ask your doctor if you need to have a glucagon shot kit at  home. Follow these instructions at home: Medicine  Take insulin and diabetes medicines as told.  If your doctor says you should take more or less insulin and medicines, do this exactly as told.  Do not run out of insulin or medicines. Having diabetes can raise your risk for other long-term conditions. These include heart disease and kidney disease. Your doctor may prescribe medicines to help you not have these problems. Food   Make healthy food choices. These include: ? Chicken, fish, egg whites, and beans. ? Oats, whole wheat, bulgur, brown rice, quinoa, and millet. ? Fresh fruits and vegetables. ? Low-fat dairy products. ? Nuts, avocado, olive oil, and canola oil.  Meet with a food specialist (dietitian). He or she can help you make an eating plan that is right for you.  Follow instructions from your doctor about what you cannot eat or drink.  Drink enough fluid to keep your pee (urine) pale yellow.  Keep track of carbs that you eat. Do this by reading food labels and learning food serving sizes.  Follow your sick day plan when you cannot eat or drink normally. Make this plan with your doctor so it is ready to use. Activity  Exercise 3 or more times a week.  Do not go more than 2 days without exercising.  Talk with your doctor before you start a new exercise. Your doctor may need to tell you to change: ? How much insulin or medicines you take. ? How much food you eat. Lifestyle  Do not use any tobacco products. These include cigarettes, chewing tobacco, and e-cigarettes. If you need help quitting, ask your doctor.  Ask your doctor how much alcohol is safe for you.  Learn to deal with stress. If you need help with this, ask your doctor. Body care   Stay up to date with your shots (immunizations).  Have your eyes and feet checked by a doctor as often as told.  Check your skin and feet every day. Check for cuts, bruises, redness, blisters, or sores.  Brush your  teeth and gums two times a day. Floss one or more times a day.  Go to the dentist one or more times every 6 months.  Stay at a healthy weight. General instructions  Take over-the-counter and prescription medicines only as told by your doctor.  Share your diabetes care plan with: ? Your work or school. ? People you live with.  Carry a card or wear jewelry that says you have diabetes.  Keep all follow-up visits as told by your doctor. This is important. Questions to ask your doctor  Do I need to meet with a diabetes educator?  Where can I find a support group for people with diabetes? Where to find more information To learn more  about diabetes, visit:  American Diabetes Association: www.diabetes.org  American Association of Diabetes Educators: www.diabeteseducator.org Summary  When you have type 2 diabetes, you must make sure your blood sugar (glucose) stays in a healthy range.  Check your blood sugar every day, as often as told.  Having diabetes can raise your risk for other conditions. Your doctor may prescribe medicines to help you not have these problems.  Keep all follow-up visits as told by your doctor. This is important.

## 2020-04-08 NOTE — Progress Notes (Signed)
Diabetes Self-Management Education  Visit Type: Follow-up  Appt. Start Time: 10330 Appt. End Time: 1130  04/08/2020  [name], identified by name and date of birth, is a 84 y.o. female with a diagnosis of Diabetes:  [name] Type 2 diabetes  ASSESSMENT Wt Readings from Last 5 Encounters:  04/08/20 290 lb 1.6 oz (131.6 kg)  03/25/20 292 lb 1.6 oz (132.5 kg)  03/14/20 293 lb (132.9 kg)  03/04/20 295 lb (133.8 kg)  12/11/19 293 lb 6.4 oz (133.1 kg)   Weight 290 lb 1.6 oz (131.6 kg), last menstrual period 01/12/1972. Body mass index is 43.79 kg/m.   She watches and does sit and be fit on [employer] about 3 days a week for 15 minutes and walks daily in her apartment building.     Diabetes Self-Management Education - 04/08/20 1100      Visit Information   Visit Type Follow-up      Health Coping   How would you rate your overall health? Good      Psychosocial Assessment   Patient Belief/Attitude about Diabetes Motivated to manage diabetes      Pre-Education Assessment   Patient understands prevention, detection, and treatment of acute complications. Needs Instruction    Patient understands how to develop strategies to promote health/change behavior. Demonstrates understanding / competency      Complications   How often do you check your blood sugar? 1-2 times/day    Fasting Blood glucose range (mg/dL) 70-129    Postprandial Blood glucose range (mg/dL) 70-129;130-179    Number of hypoglycemic episodes per month 0      Dietary Intake   Breakfast 1/2 c grits, 2 eggs, chicken, coffee    Lunch sandwich      Exercise   Exercise Type ADL's;Light (walking / raking leaves)    How many days per week to you exercise? 4    How many minutes per day do you exercise? 30    Total minutes per week of exercise 120      Patient Education   Previous Diabetes Education Yes (please comment)   had prediabetes classes   Monitoring Taught/evaluated SMBG meter.   gave patient a onetouch reflect  meter, result was 811   Acute complications Discussed and identified patients' treatment of hyperglycemia.    Psychosocial adjustment Worked with patient to identify barriers to care and solutions;Identified and addressed patients feelings and concerns about diabetes      Individualized Goals (developed by patient)   Monitoring  test my blood glucose as discussed      Patient Self-Evaluation of Goals - Patient rates self as meeting previously set goals (% of time)   Physical Activity >75%      Outcomes   Expected Outcomes Demonstrated interest in learning. Expect positive outcomes    Future DMSE 4-6 wks    Program Status Not Completed      Subsequent Visit   Since your last visit have you continued or begun to take your medications as prescribed? Not on Medications    Since your last visit have you had your blood pressure checked? No    Since your last visit have you experienced any weight changes? Loss    Weight Loss (lbs) 2    Since your last visit, are you checking your blood glucose at least once a day? N/A           Individualized Plan for Diabetes Self-Management Training:   Learning Objective:  Patient will have a  greater understanding of diabetes self-management. Patient education plan is to attend individual and/or group sessions per assessed needs and concerns.   Plan:   Patient Instructions  I recommend 1-2 tablespoons PSYLLIUM HUSK each day in 8 oz water or juice to keep your bowel healthy-  [facility]   [place] job with exercising.     You can call sit and be fit to ask when it is on and ask them to have it on more days of the week.  [phone]) [phone]  Cabbage Squash Onions Lettuce Tomatoes Carrots Beets  Brown rice is a whole grains- way to go!  Your blood sugar numbers:   fasting means first thing tin the morning after not eating for 8 hours (before breakfast is often fasting)  Should be Before eating 70-120  then goes up for about 2  hours to no higher than 180 mg/d;(200 is too high for everybody and all the time) Then it goes down for about 2 hours and then should stay about your fasting level ( 70-120).    Type 2 Diabetes Mellitus, Self Care, Adult When you have type 2 diabetes (type 2 diabetes mellitus), you must make sure your blood sugar (glucose) stays in a healthy range. You can do this with:  Nutrition.  Exercise.  Lifestyle changes.  Medicines or insulin, if needed.  Support from your doctors and others. How to stay aware of blood sugar   Check your blood sugar level every day, as often as told.  Have your A1c (hemoglobin A1c) level checked two or more times a year. Have it checked more often if your doctor tells you to. Your doctor will set personal treatment goals for you. Generally, you should have these blood sugar levels:  Before meals (preprandial): 80-130 mg/dL (4.4-7.2 mmol/L).  After meals (postprandial): below 180 mg/dL (10 mmol/L).  A1c level: less than 7%. How to manage high and low blood sugar Signs of high blood sugar High blood sugar is called hyperglycemia. Know the signs of high blood sugar. Signs may include:  Feeling: ? Thirsty. ? Hungry. ? Very tired.  Needing to pee (urinate) more than usual.  Blurry vision. Signs of low blood sugar Low blood sugar is called hypoglycemia. This is when blood sugar is at or below 70 mg/dL (3.9 mmol/L). Signs may include:  Feeling: ? Hungry. ? Worried or nervous (anxious). ? Sweaty and clammy. ? Confused. ? Dizzy. ? Sleepy. ? Sick to your stomach (nauseous).  Having: ? A fast heartbeat. ? A headache. ? A change in your vision. ? Jerky movements that you cannot control (seizure). ? Tingling or no feeling (numbness) around your mouth, lips, or tongue.  Having trouble with: ? Moving (coordination). ? Sleeping. ? Passing out (fainting). ? Getting upset easily (irritability). Treating low blood sugar To treat low blood sugar,  eat or drink something sugary right away. If you can think clearly and swallow safely, follow the 15:15 rule:  Take 15 grams of a fast-acting carb (carbohydrate). Talk with your doctor about how much you should take.  Some fast-acting carbs are: ? Sugar tablets (glucose pills). Take 3-4 pills. ? 6-8 pieces of hard candy. ? 4-6 oz (120-150 mL) of fruit juice. ? 4-6 oz (120-150 mL) of regular (not diet) soda. ? 1 Tbsp (15 mL) honey or sugar.  Check your blood sugar 15 minutes after you take the carb.  If your blood sugar is still at or below 70 mg/dL (3.9 mmol/L), take  15 grams of a carb again.  If your blood sugar does not go above 70 mg/dL (3.9 mmol/L) after 3 tries, get help right away.  After your blood sugar goes back to normal, eat a meal or a snack within 1 hour. Treating very low blood sugar If your blood sugar is at or below 54 mg/dL (3 mmol/L), you have very low blood sugar (severe hypoglycemia). This is an emergency. Do not wait to see if the symptoms will go away. Get medical help right away. Call your local emergency services (911 in the [place] If you have very low blood sugar and you cannot eat or drink, you may need a glucagon shot (injection). A family member or friend should learn how to check your blood sugar and how to give you a glucagon shot. Ask your doctor if you need to have a glucagon shot kit at home. Follow these instructions at home: Medicine  Take insulin and diabetes medicines as told.  If your doctor says you should take more or less insulin and medicines, do this exactly as told.  Do not run out of insulin or medicines. Having diabetes can raise your risk for other long-term conditions. These include heart disease and kidney disease. Your doctor may prescribe medicines to help you not have these problems. Food   Make healthy food choices. These include: ? Chicken, fish, egg whites, and beans. ? Oats, whole wheat, bulgur, brown rice, quinoa, and  millet. ? Fresh fruits and vegetables. ? Low-fat dairy products. ? Nuts, avocado, olive oil, and canola oil.  Meet with a food specialist (dietitian). He or she can help you make an eating plan that is right for you.  Follow instructions from your doctor about what you cannot eat or drink.  Drink enough fluid to keep your pee (urine) pale yellow.  Keep track of carbs that you eat. Do this by reading food labels and learning food serving sizes.  Follow your sick day plan when you cannot eat or drink normally. Make this plan with your doctor so it is ready to use. Activity  Exercise 3 or more times a week.  Do not go more than 2 days without exercising.  Talk with your doctor before you start a new exercise. Your doctor may need to tell you to change: ? How much insulin or medicines you take. ? How much food you eat. Lifestyle  Do not use any tobacco products. These include cigarettes, chewing tobacco, and e-cigarettes. If you need help quitting, ask your doctor.  Ask your doctor how much alcohol is safe for you.  Learn to deal with stress. If you need help with this, ask your doctor. Body care   Stay up to date with your shots (immunizations).  Have your eyes and feet checked by a doctor as often as told.  Check your skin and feet every day. Check for cuts, bruises, redness, blisters, or sores.  Brush your teeth and gums two times a day. Floss one or more times a day.  Go to the dentist one or more times every 6 months.  Stay at a healthy weight. General instructions  Take over-the-counter and prescription medicines only as told by your doctor.  Share your diabetes care plan with: ? Your work or school. ? People you live with.  Carry a card or wear jewelry that says you have diabetes.  Keep all follow-up visits as told by your doctor. This is important. Questions to ask your doctor  Do  I need to meet with a diabetes educator?  Where can I find a support group  for people with diabetes? Where to find more information To learn more about diabetes, visit:  American Diabetes Association: www.diabetes.org  American Association of Diabetes Educators: www.diabeteseducator.org Summary  When you have type 2 diabetes, you must make sure your blood sugar (glucose) stays in a healthy range.  Check your blood sugar every day, as often as told.  Having diabetes can raise your risk for other conditions. Your doctor may prescribe medicines to help you not have these problems.  Keep all follow-up visits as told by your doctor. This is important.     Expected Outcomes:  Demonstrated interest in learning. Expect positive outcomes  Education material provided: Diabetes Resources  If problems or questions, patient to contact team via:  Phone  Future DSME appointment: 3 wks [name], RD 04/08/2020 11:41 AM.

## 2020-04-08 NOTE — Telephone Encounter (Signed)
Diabetes supply request

## 2020-04-15 ENCOUNTER — Ambulatory Visit: Payer: Medicare Other | Admitting: Licensed Clinical Social Worker

## 2020-04-15 ENCOUNTER — Telehealth: Payer: Self-pay | Admitting: Licensed Clinical Social Worker

## 2020-04-15 ENCOUNTER — Other Ambulatory Visit: Payer: Self-pay

## 2020-04-15 NOTE — Telephone Encounter (Signed)
Patient was called for her scheduled appointment. Patient requested to be rescheduled. Patient will be moved to 7/13 @ 10:30.

## 2020-04-18 ENCOUNTER — Telehealth: Payer: Self-pay | Admitting: Dietician

## 2020-04-18 NOTE — Telephone Encounter (Signed)
Called about someone calling and asking her about insulin. [facility] so she called [place]. I told her it was probably a scam trying to get her to sign up for something and that we would identify ourselves and ask for her date of birth if we called. She verbalized understanding and appreciation.

## 2020-04-22 ENCOUNTER — Other Ambulatory Visit: Payer: Self-pay

## 2020-04-22 ENCOUNTER — Ambulatory Visit (INDEPENDENT_AMBULATORY_CARE_PROVIDER_SITE_OTHER): Payer: Medicare Other | Admitting: Licensed Clinical Social Worker

## 2020-04-22 ENCOUNTER — Encounter: Payer: Self-pay | Admitting: Licensed Clinical Social Worker

## 2020-04-22 DIAGNOSIS — F4321 Adjustment disorder with depressed mood: Secondary | ICD-10-CM

## 2020-04-22 NOTE — BH Specialist Note (Signed)
Integrated Behavioral Health Visit via Telemedicine (Telephone)  04/22/2020 JYLA HOPF [id number]   Session Start time: 10:30  Session End time: 10:50 Total time: 20 minutes  Referring Provider: Dr. Dareen Piano Type of Visit: Telephonic Patient location: Home [facility] Provider location: Office All persons participating in visit: Patient and [facility]  Confirmed patient's address: Yes  Confirmed patient's phone number: Yes  Any changes to demographics: No   Discussed confidentiality: Yes    The following statements were read to the patient and/or legal guardian that are established with the [facility] Provider.  "The purpose of this phone visit is to provide behavioral health care while limiting exposure to the coronavirus (COVID19).  There is a possibility of technology failure and discussed alternative modes of communication if that failure occurs."  "By engaging in this telephone visit, you consent to the provision of healthcare.  Additionally, you authorize for your insurance to be billed for the services provided during this telephone visit."   Patient and/or legal guardian consented to telephone visit: Yes   PRESENTING CONCERNS: Patient and/or family reports the following symptoms/concerns: loneliness, grief, sadness. Adjust to learning of a new health issue. Duration of problem: September 2020; Severity of problem: mild  GOALS ADDRESSED: Patient will: 1.  Reduce symptoms of: depression and grief.  2.  Increase knowledge and/or ability of: coping skills and healthy habits  3.  Demonstrate ability to: Increase healthy adjustment to current life circumstances, Increase adequate support systems for patient/family and Begin healthy grieving over loss  INTERVENTIONS: Interventions utilized:  Brief CBT and Supportive Counseling Standardized Assessments completed: Not Needed  ASSESSMENT: Patient currently experiencing mild levels of depression and grief. Patient does still  cope with sadness from the loss of her grandson. Patient is trying to remember happy memories about him. Patient does still feel lonely at times, and that "everyone has left her'. Patient is trying to practice coming back to the present, and focus on the people in her life that are still physically here.   Patient is having a difficulty time adjusting to having been diagnosed with diabetes. Patient is checking her sugar, and reported some worry about long term issues.    Patient may benefit from counseling.  PLAN: 1. Follow up with behavioral health clinician on : three weeks.   Dessie Coma, [place], [place]

## 2020-04-29 ENCOUNTER — Ambulatory Visit (INDEPENDENT_AMBULATORY_CARE_PROVIDER_SITE_OTHER): Payer: Medicare Other | Admitting: Dietician

## 2020-04-29 ENCOUNTER — Encounter: Payer: Self-pay | Admitting: Dietician

## 2020-04-29 DIAGNOSIS — E119 Type 2 diabetes mellitus without complications: Secondary | ICD-10-CM

## 2020-04-29 NOTE — Patient Instructions (Addendum)
Wt Readings from Last 5 Encounters:  04/29/20 288 lb 12.8 oz (131 kg)  04/08/20 290 lb 1.6 oz (131.6 kg)  03/25/20 292 lb 1.6 oz (132.5 kg)  03/14/20 293 lb (132.9 kg)  03/04/20 295 lb (133.8 kg)   Keep eating balanced meals and snacks and walking and doing sit and be fit!  Keep checking your blood sugar at least 1 time a day and anytime you wonder how a food affects it.   See you in a month.   Christine [name] [phone]

## 2020-04-29 NOTE — Progress Notes (Signed)
Diabetes Self-Management Education  Visit Type: Follow-up  Appt. Start Time: 1030 Appt. End Time: 1115  04/29/2020  [name], identified by name and date of birth, is a 84 y.o. female with a diagnosis of Diabetes: Type 2.  ASSESSMENT Wt Readings from Last 5 Encounters:  04/29/20 288 lb 12.8 oz (131 kg)  04/08/20 290 lb 1.6 oz (131.6 kg)  03/25/20 292 lb 1.6 oz (132.5 kg)  03/14/20 293 lb (132.9 kg)  03/04/20 295 lb (133.8 kg)   Estimated body mass index is 43.59 kg/m as calculated from the following:   Height as of 03/14/20: 5' 8.25" (1.734 m).   Weight as of this encounter: 288 lb 12.8 oz (131 kg).  14 out of 14 blood sugars in target over last 7 days. However, her fastings tend to be >130.   She watches / does sit and be fit on [employer] about 3 days a week for 15 minutes and walks daily in her apartment building.    Diabetes Self-Management Education - 04/29/20 1000      Visit Information   Visit Type Follow-up      Initial Visit   Diabetes Type Type 2    Are you currently following a meal plan? Yes    What type of meal plan do you follow? well balanced meals, more vegeatbels an fruits, drinks water    Are you taking your medications as prescribed? Not on Medications      Patient Education   Previous Diabetes Education Yes (please comment)    Physical activity and exercise  Role of exercise on diabetes management, blood pressure control and cardiac health.    Monitoring Taught/evaluated SMBG meter.    Acute complications Discussed and identified patients' treatment of hyperglycemia.    Psychosocial adjustment Helped patient identify a support system for diabetes management    Personal strategies to promote health Helped patient develop diabetes management plan for (enter comment)   barriers or challenges that might affect blood sugar     Patient Self-Evaluation of Goals - Patient rates self as meeting previously set goals (% of time)   Monitoring >75%       Outcomes   Expected Outcomes Demonstrated interest in learning. Expect positive outcomes    Future DMSE 4-6 wks    Program Status Not Completed         My plan to support myself in continuing these changes to care for my diabetes is to attend or contact:  -doctor's office, [place], Dietitian, pharmacist    Individualized Plan for Diabetes Self-Management Training:   Learning Objective:  Patient will have a greater understanding of diabetes self-management. Patient education plan is to attend individual and/or group sessions per assessed needs and concerns.   Plan:   Patient Instructions   Wt Readings from Last 5 Encounters:  04/29/20 288 lb 12.8 oz (131 kg)  04/08/20 290 lb 1.6 oz (131.6 kg)  03/25/20 292 lb 1.6 oz (132.5 kg)  03/14/20 293 lb (132.9 kg)  03/04/20 295 lb (133.8 kg)   Keep eating balanced meals and snacks and walking and doing sit and be fit!  Keep checking your blood sugar at least 1 time a day and anytime you wonder how a food affects it.   See you in a month.   [name] [phone]    Expected Outcomes:  Demonstrated interest in learning. Expect positive outcomes  Education material provided: Diabetes Resources  If problems or questions, patient to contact team via:  Phone  Future DSME appointment: 4 wks [name], RD 04/29/2020 11:26 AM.

## 2020-05-05 ENCOUNTER — Telehealth: Payer: Self-pay | Admitting: Dietician

## 2020-05-05 NOTE — Telephone Encounter (Signed)
Answered her question about reusing lancets when checking blood sugar.

## 2020-05-13 ENCOUNTER — Ambulatory Visit (INDEPENDENT_AMBULATORY_CARE_PROVIDER_SITE_OTHER): Payer: Medicare Other | Admitting: Licensed Clinical Social Worker

## 2020-05-13 ENCOUNTER — Other Ambulatory Visit: Payer: Self-pay

## 2020-05-13 DIAGNOSIS — F4321 Adjustment disorder with depressed mood: Secondary | ICD-10-CM

## 2020-05-13 NOTE — BH Specialist Note (Signed)
Integrated Behavioral Health Visit via Telemedicine (Telephone)  05/13/2020 DANELLY HASSINGER [id number]   Session Start time: 11:30  Session End time: 11:50 Total time: 20 minutes  Referring Provider: Dr. Dareen Piano Type of Visit: Telephonic Patient location: Home [facility] Provider location: Office All persons participating in visit: Patient and [facility]  Confirmed patient's address: Yes  Confirmed patient's phone number: Yes  Any changes to demographics: No   Discussed confidentiality: Yes    The following statements were read to the patient and/or legal guardian that are established with the [facility] Provider.  "The purpose of this phone visit is to provide behavioral health care while limiting exposure to the coronavirus (COVID19).  There is a possibility of technology failure and discussed alternative modes of communication if that failure occurs."  "By engaging in this telephone visit, you consent to the provision of healthcare.  Additionally, you authorize for your insurance to be billed for the services provided during this telephone visit."   Patient and/or legal guardian consented to telephone visit: Yes   PRESENTING CONCERNS: Patient and/or family reports the following symptoms/concerns:loneliness, grief, sadness. Adjust to learning of a new health issue. Duration of problem: September 2020; Severity of problem: mild   GOALS ADDRESSED: Patient will: 1.  Reduce symptoms of: depression and grief.  2.  Increase knowledge and/or ability of: coping skills and healthy habits  3.  Demonstrate ability to: Increase healthy adjustment to current life circumstances, Increase adequate support systems for patient/family and Begin healthy grieving over loss  INTERVENTIONS: Interventions utilized:  Brief CBT and Supportive Counseling Standardized Assessments completed: Not Needed  ASSESSMENT: Patient currently experiencing mild levels of depression and grief. Patient is trying to  stay positive about her health, but reported still having challenges accepting a recent diagnosis of diabetes. Patient is trying to watch her diet in order to reduce her need to have to use insulin. Patient is still grieving her family member, but has made progress with grieving. Patient is using healthy coping skills to reduce sadness, and she has healthy natural supports.    Patient may benefit from counseling.  PLAN: 1. Follow up with behavioral health clinician on : three weeks.   Dessie Coma, [facility], [place]

## 2020-05-19 ENCOUNTER — Telehealth: Payer: Self-pay | Admitting: Dietician

## 2020-05-19 NOTE — Telephone Encounter (Signed)
Her blood sugar is 101 and she wanted to know if that is too low. I explained that is high normal and that's a good sign that her lifestyle changes may be having an affect on her blood sugars.

## 2020-05-21 ENCOUNTER — Encounter: Payer: Self-pay | Admitting: Licensed Clinical Social Worker

## 2020-05-27 ENCOUNTER — Other Ambulatory Visit: Payer: Self-pay

## 2020-05-27 ENCOUNTER — Ambulatory Visit: Payer: Medicare Other | Admitting: Dietician

## 2020-05-27 ENCOUNTER — Telehealth: Payer: Self-pay | Admitting: Dietician

## 2020-05-27 NOTE — Telephone Encounter (Signed)
Patient agreed to convert her visit today to telehealth visit.

## 2020-05-27 NOTE — Patient Instructions (Addendum)
Christine [name],  Today we talked about that it is okay to check your blood sugar one time a day and even skipping a day every now and then is fine. However, if it is ever more than 140-160 mg/dl after eating, you want to ask yourself-  Did I eat or drink something that may have increased my blood sugar or did I eat/drink more than usual   did I miss my exercise  do I feel sick?   Weighing yourself once day a week at the same time. (log sheets are enclosed)  We also talked about the signs and symptoms of high blood sugar. Below is a review.   We'll talk in a month to see how you are doing!  Take care,                [name] [phone]   Hyperglycemia (High Blood Sugar) Hyperglycemia is when the sugar (glucose) level in your blood is too high. It may not cause symptoms. If you do have symptoms, they may include warning signs, such as:  Feeling more thirsty than normal.  Hunger.  Feeling tired.  Needing to pee (urinate) more than normal.  Blurry eyesight (vision).  High blood sugar can happen slowly or quickly, and it can be an emergency.  Follow these instructions at home: General instructions  Check your blood sugar if you have these symptoms. If it is higher than 200 mg/dl, cut back on starches and sugars, drink more water and take a walk to help lower it.  Eating and drinking  Stay at a healthy weight.  Exercise regularly, as told by your doctor.  Drink enough fluid, especially when you: ? Exercise. ? Get sick. ? Are in hot temperatures.  Eat healthy foods, such as: ? Low-fat (lean) proteins. ? Complex carbs (complex carbohydrates), such as whole wheat bread or brown rice. ? Fresh fruits and vegetables. ? Low-fat dairy products. ? Healthy fats.  Drink enough fluid to keep your pee (urine) clear or pale yellow.   Contact a doctor if:  Your blood sugar level is higher than 240 mg/dL for 2 days in a row.  You have problems keeping your blood sugar in your target  range.  High blood sugar happens often for you.  Get help right away if:  You have trouble breathing.  You have a change in how you think, feel, or act (mental status).  You feel sick to your stomach (nauseous), and that feeling does not go away.  You cannot stop throwing up (vomiting). These symptoms may be an emergency. Do not wait to see if the symptoms will go away. Get medical help right away. Call your local emergency services (911 in the [place] Do not drive yourself to the hospital.   Summary  Hyperglycemia is when the sugar (glucose) level in your blood is too high.  Make sure you drink enough fluids, eat healthy foods, and exercise regularly.  Contact your doctor if you have problems keeping your blood sugar in your target range.

## 2020-05-27 NOTE — Progress Notes (Signed)
Diabetes Self-Management Education  Visit Type: Follow-up  Appt. Start Time: 1021 Appt. End Time: 1950  05/27/2020   This is a telephone encounter between Christine [name]  and [name] [name]   on 05/27/2020 for Medical Nutrition Therapy . The visit was conducted with the patient located at home and [name]  at [facility]. The patient's identity was confirmed using their DOB and current address. The patient has consented to being evaluated through a telephone encounter and understands the associated risks / benefits (allows the patient to remain at home, decreasing exposure to coronavirus). I personally spent 24 minutes on Diabetes Self Management Training  discussion.    Patient and/or legal guardian consented to telephone visit: yes   [name], identified by name and date of birth, is a 84 y.o. female with a diagnosis of Diabetes:  [name] Type 2  ASSESSMENT  She has not weighed but will start tomorrow.   Blood sugar: she reports that her fasting blood sugar is lower Used up all her strips so had to pay for more cost her 25$ 8/17-  129 8/16- 129 at am  135 after eating Range is ~ 122 is the lowest before breakfast, highest 157  She watches/does sit and be fit on [employer] about 3 days a week for 15 minutes and walks daily in her apartment building every day.   She has cut back on grits,cakes, candy, bread,  macaroni, biscuits She is thinking about getting the covid-19 vaccine   Diabetes Self-Management Education - 05/27/20 1100      Visit Information   Visit Type Follow-up      Health Coping   How would you rate your overall health? Good      Dietary Intake   Breakfast raisin bran/milk or frits, 1/2 slice toast and turely     Snack (morning) fruit    Lunch baked chiocken breast, string beansm 1/2 whole wheat bread    Snack (afternoon) fruit    Dinner tossed salad with [place] a nd hceese,. 3 whole wheat crackers    Beverage(s) water mostly, coffee,milk, diet soda every now  and them      Exercise   Exercise Type ADL's;Light (walking / raking leaves)    How many days per week to you exercise? 6    How many minutes per day do you exercise? 30    Total minutes per week of exercise 180      Patient Education   Previous Diabetes Education Yes (please comment)    Nutrition management  Reviewed blood glucose goals for pre and post meals and how to evaluate the patients' food intake on their blood glucose level.    Monitoring Taught/discussed recording of test results and interpretation of SMBG.;Identified appropriate SMBG and/or A1C goals.    Acute complications Discussed and identified patients' treatment of hyperglycemia.      Patient Self-Evaluation of Goals - Patient rates self as meeting previously set goals (% of time)   Nutrition >75%    Physical Activity >75%      Outcomes   Expected Outcomes Demonstrated interest in learning. Expect positive outcomes    Future DMSE 4-6 wks    Program Status Completed      Subsequent Visit   Since your last visit have you continued or begun to take your medications as prescribed? Not on Medications    Since your last visit have you had your blood pressure checked? No    Since your last visit have you  experienced any weight changes? --   she has not weighed, will start this week at home   Since your last visit, are you checking your blood glucose at least once a day? Yes           Individualized Plan for Diabetes Self-Management Training:   Learning Objective:  Patient will have a greater understanding of diabetes self-management. Patient education plan is to attend individual and/or group sessions per assessed needs and concerns.   Plan:   Patient Instructions  Christine [name],  Today we talked about that it is okay to check your blood sugar one time a day and even skipping a day every now and then is fine. However, if it is ever more than 140-160 mg/dl after eating, you want to ask yourself-  Did I eat or drink  something that may have increased my blood sugar or did I eat/drink more than usual   did I miss my exercise  do I feel sick?   Weighing yourself once day a week at the same time. (log sheets are enclosed)  We also talked about the signs and symptoms of high blood sugar. Below is a review.   We'll talk in a month to see how you are doing!  Take care,                [name] [phone]   Hyperglycemia (High Blood Sugar) Hyperglycemia is when the sugar (glucose) level in your blood is too high. It may not cause symptoms. If you do have symptoms, they may include warning signs, such as:  Feeling more thirsty than normal.  Hunger.  Feeling tired.  Needing to pee (urinate) more than normal.  Blurry eyesight (vision).  High blood sugar can happen slowly or quickly, and it can be an emergency.  Follow these instructions at home: General instructions  Check your blood sugar if you have these symptoms. If it is higher than 200 mg/dl, cut back on starches and sugars, drink more water and take a walk to help lower it.  Eating and drinking  Stay at a healthy weight.  Exercise regularly, as told by your doctor.  Drink enough fluid, especially when you: ? Exercise. ? Get sick. ? Are in hot temperatures.  Eat healthy foods, such as: ? Low-fat (lean) proteins. ? Complex carbs (complex carbohydrates), such as whole wheat bread or brown rice. ? Fresh fruits and vegetables. ? Low-fat dairy products. ? Healthy fats.  Drink enough fluid to keep your pee (urine) clear or pale yellow.   Contact a doctor if:  Your blood sugar level is higher than 240 mg/dL for 2 days in a row.  You have problems keeping your blood sugar in your target range.  High blood sugar happens often for you.  Get help right away if:  You have trouble breathing.  You have a change in how you think, feel, or act (mental status).  You feel sick to your stomach (nauseous), and that feeling does not go  away.  You cannot stop throwing up (vomiting). These symptoms may be an emergency. Do not wait to see if the symptoms will go away. Get medical help right away. Call your local emergency services (911 in the [place] Do not drive yourself to the hospital.   Summary  Hyperglycemia is when the sugar (glucose) level in your blood is too high.  Make sure you drink enough fluids, eat healthy foods, and exercise regularly.  Contact your doctor if you have  problems keeping your blood sugar in your target range.     Expected Outcomes:  Demonstrated interest in learning. Expect positive outcomesexcellent  Education material provided: Diabetes Resources  If problems or questions, patient to contact team via:  Phone  Future DSME appointment: 4-6 wks [name], RD 05/27/2020 11:26 AM.

## 2020-06-02 ENCOUNTER — Telehealth: Payer: Self-pay | Admitting: Dietician

## 2020-06-02 NOTE — Telephone Encounter (Signed)
Ms/ Surges calls saying she is out of strips again and cannot get nay until 8/26. Advised when she gets strips to only check her blood sugar 1 time a day ~ 2 hours after her largest meal. She verbalized understanding.

## 2020-06-03 ENCOUNTER — Ambulatory Visit: Payer: Medicare Other | Admitting: Licensed Clinical Social Worker

## 2020-06-11 ENCOUNTER — Other Ambulatory Visit: Payer: Self-pay

## 2020-06-11 ENCOUNTER — Encounter: Payer: Self-pay | Admitting: Licensed Clinical Social Worker

## 2020-06-11 ENCOUNTER — Ambulatory Visit (INDEPENDENT_AMBULATORY_CARE_PROVIDER_SITE_OTHER): Payer: Medicare Other | Admitting: Licensed Clinical Social Worker

## 2020-06-11 DIAGNOSIS — F4321 Adjustment disorder with depressed mood: Secondary | ICD-10-CM

## 2020-06-11 NOTE — BH Specialist Note (Signed)
Integrated Behavioral Health Visit via Telemedicine (Telephone)  06/11/2020 Christine [name] [id number]   Session Start time: 11:05  Session End time: 11;30 Total time: 25 minutes  Referring Provider: Dr. Dareen Piano Type of Visit: Telephonic Patient location: Home [facility] Provider location: Office All persons participating in visit: Patient and [facility] Confirmed patient's address: Yes  Confirmed patient's phone number: Yes  Any changes to demographics: No   The following statements were read to the patient and/or legal guardian that are established with the [facility] Provider.  "The purpose of this phone visit is to provide behavioral health care while limiting exposure to the coronavirus (COVID19).  There is a possibility of technology failure and discussed alternative modes of communication if that failure occurs."  Discussed confidentiality: Yes   "By engaging in this telephone visit, you consent to the provision of healthcare.  Additionally, you authorize for your insurance to be billed for the services provided during this telephone visit."   Patient and/or legal guardian consented to telephone visit: Yes   PRESENTING CONCERNS: Patient and/or family reports the following symptoms/concerns: loneliness, grief, sadness.Adjust to learning of a new health issue. Duration of problem: September 2020; Severity of problem: mild  GOALS ADDRESSED: Patient will: 1.  Reduce symptoms of: anxiety, depression and grief.  2.  Increase knowledge and/or ability of: coping skills and healthy habits  3.  Demonstrate ability to: Increase healthy adjustment to current life circumstances, Increase adequate support systems for patient/family, Increase motivation to adhere to plan of care and Begin healthy grieving over loss  INTERVENTIONS: Interventions utilized:  Mindfulness or Relaxation Training and Supportive Counseling Standardized Assessments completed: Not Needed  ASSESSMENT: Patient  currently experiencing mild levels of depression and grief. Patient is continuing to make progress coping with her grief reactions. Patient is able to shift her mind to positive memories when feeling distressed. Patient has recently became more anxious about her diabetes, and how to check her blood sugar. Patient could benefit from continuing to speak with [name], RD to assist her with psychoeducation around diabetes.    Patient may benefit from continuing counseling services.  PLAN: 1. Follow up with behavioral health clinician on : transitioning to [place] with [facility] due to my departure from [facility]. 2. Behavioral recommendations: review the letter sent to you with local providers as well as connect with our [facility] clinician if you choose.  3. Referral(s): [place] (In Clinic), [place], [facility], [facility]

## 2020-06-13 ENCOUNTER — Telehealth: Payer: Self-pay | Admitting: Dietician

## 2020-06-13 NOTE — Telephone Encounter (Signed)
Christine [name] called saying she was feeling a bit dizzy and nauseated and chekced her blood sugar with a result of 180mg /dl. 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 We discussed that is is unlikely her blood sugar and that she may want to be evaluated in the office. (It is time for her to follow up with Dr. Dareen Piano now so asked front office to schedule her. ) she verbalized understanding.

## 2020-06-17 ENCOUNTER — Telehealth: Payer: Self-pay | Admitting: Dietician

## 2020-06-17 NOTE — Telephone Encounter (Signed)
Christine [name] calls to ask if her fluctuating blood sugars could be making her abdomen hurt. Her blood sugar have been 110-120. She is having regular bowel movements. She states she has not started any new medicines or supplements. She thinks it may be coming from eating a little less to try to make her blood sugar lower. Support provided.

## 2020-06-20 ENCOUNTER — Other Ambulatory Visit: Payer: Self-pay | Admitting: Physician Assistant

## 2020-06-20 NOTE — Telephone Encounter (Signed)
Please get CMP from PCP.

## 2020-06-20 NOTE — Telephone Encounter (Signed)
Last Visit: 03/14/2020 Next Visit: 09/12/2020 Labs: 12/11/2019 Glucose 134, Alk Phos 127  Current Dose per office note 03/14/2020: not discussed.  DX:  Pseudogout  Okay to refill Colchicine?

## 2020-06-24 ENCOUNTER — Ambulatory Visit (INDEPENDENT_AMBULATORY_CARE_PROVIDER_SITE_OTHER): Payer: Medicare Other | Admitting: Dietician

## 2020-06-24 ENCOUNTER — Encounter: Payer: Self-pay | Admitting: Dietician

## 2020-06-24 ENCOUNTER — Other Ambulatory Visit: Payer: Self-pay

## 2020-06-24 DIAGNOSIS — E119 Type 2 diabetes mellitus without complications: Secondary | ICD-10-CM

## 2020-06-24 NOTE — Progress Notes (Signed)
Called patient and left a message for return call for our appointment this morning.  Christine Penny Rye Dorado, RD 06/24/2020 8:19 AM. Christine [name] returned my call:  Telephone visit due to COVID-19.  This is a telephone encounter between Christine [name]  and Christine [name]  on 06/24/2020 for Medical Nutrition Therapy . The visit was conducted with the patient located at home and Christine [name]  at [facility]. The patient's identity was confirmed using their DOB and current address. The patient has consented to being evaluated through a telephone encounter and understands the associated risks / benefits (allows the patient to remain at home, decreasing exposure to coronavirus). I personally spent 20 minutes on medical nutrition therapy discussion.   The following statements were read to the patient and/or legal guardian that are established with the medical nutrition therapy Provider.   "By engaging in this telephone visit, you consent to the provision of healthcare.  Additionally, you authorize for your insurance to be billed for the services provided during this telephone visit."    Patient and/or legal guardian consented to telephone visit: yes  Medical Nutrition Therapy Via Telemedicine (Telephone):  Appt start time: 0828 end time:  0848. Total time: 20 Visit # 1  Assessment:  Primary concerns today: blood sugar control, new to diabetes in June 2021. Christine [name] has had classes for prediabetes that included information on diet, exercise and behavior change.  She has been weighing herself once time a week. She stopped checking her blood sugars daily and is content to check them a few times a week and when she wonders how a food is going to affect hr blood sugars.  She states that she now feels better,clothes fitting better, walks lighter.   Preferred Learning Style: No preference indicated  Learning Readiness:  Ready and Change in progress  ANTHROPOMETRICS:reports her weight this week is 276#; Estimated body mass  index is 41.66 kg/m as calculated from the following:   Height as of 03/14/20: 5' 8.25" (1.734 m).   Weight as of this encounter: 276 lb (125.2 kg).  WEIGHT HISTORY:  Wt Readings from Last 10 Encounters:  06/24/20 276 lb (125.2 kg)  04/29/20 288 lb 12.8 oz (131 kg)  04/08/20 290 lb 1.6 oz (131.6 kg)  03/25/20 292 lb 1.6 oz (132.5 kg)  03/14/20 293 lb (132.9 kg)  03/04/20 295 lb (133.8 kg)  12/11/19 293 lb 6.4 oz (133.1 kg)  08/14/19 296 lb 11.2 oz (134.6 kg)  05/28/19 292 lb (132.5 kg)  05/15/19 293 lb 1.6 oz (132.9 kg)    Sleep- need to assess at future visit MEDICATIONS: reviewed anticipate possible improvement in her blood pressure BLOOD SUGAR: not taking as often, was mostly in 100s DIETARY INTAKE: Usual eating pattern includes 3 meals and 0-1 snacks per day. Everyday foods include coffee, water, vegetables eggs, bread. .  Food Intolerances:none mentioned Nausea:was having but is better now, Constipation: sometimes, Dining Out (times/week): seldom 24-hr recall:  Half cup grits w/ margerine, boiled egg, chicken sausage x 1 little bigger than a hot dog, half cup coffee- sweet n low sweet milk 1130-1200- boiled chicken,bread, water,tossed salad or green beans  4-430 pm- leftovers 2-3 times- Peaches,canteloupe, grapes,watermelon Beverages: water, coffee, milk  Usual physical activity: sit and be fit -3-4 days     Walks a mile 5 days- 30-35 minutes   Progress Towards Goal(s):  In progress.   Nutritional Diagnosis:  NB-1.1 Food and nutrition-related knowledge deficit As related to lack of sufficient prior diabetes and meal  planning education.  As evidenced by her report and questions..    Intervention:  Nutrition education about taking care of her diabetes and constipation.  Action Goals: checking blood glucose- better- realizes she does not have to check everyday             Exercise 3 times a week -good             Eating smaller portions- very good  Outcome goal: lower  blood sugars  Coordination of care: asked front office to make her an appointment with Dr. Dareen Piano; mail Ms. Liew her eye doctor's phone number to call and make an appointment  Teaching Method Utilized: Visual, Auditory,Hands on Handouts given during visit include: After visit summary Barriers to learning/adherence to lifestyle change: anxiety,  Demonstrated degree of understanding via:  Teach Back   Monitoring/Evaluation:  Dietary intake, exercise, meter, and body weight in 2 month(s). Christine Penny Ameri Cahoon, RD 06/24/2020 9:10 AM.

## 2020-06-24 NOTE — Patient Instructions (Addendum)
Hi Deshea,   Thank you for your visit today!  Way to go walking and making healthy food choices!  Call your eye doctor to make a yearly appointment- Dr. Katy Fitch [phone]  Bring your meter when you come to see Dr. Dareen Piano.    For constipation or to keep your gut healthy you can do the following every day:  o Drink 64 fl oz or more of water and other low-calorie (10 calories or less per 8 fl oz) beverages o You can add 1-2 tablespoons per day of Metamucil (Psyllium Husk), 1 teaspoon at a time, to any fluids you choose oOR  Add 1-2 tablespoons chia seeds or  ground flax seeds to yogurt or oatmeal o Eat some prunes, can have a little prune juice o Eat as much leafy green vegetables as possible (spinach, kale, collards, turnip and mustard greens); they provided a lot of fiber, and are a good source of magnesium as well. Add kale or spinach to smoothies with frozen berries (more fiber) if you do not like to eat greens. o Other magnesium-rich foods are: legumes (beans and lentils - also great sources of fiber), nuts like almonds and cashews, avocado, yogurt, and milk.  Take care and please feel free to call anytime!  [name] [phone]

## 2020-07-06 ENCOUNTER — Other Ambulatory Visit: Payer: Self-pay | Admitting: Internal Medicine

## 2020-07-06 DIAGNOSIS — I10 Essential (primary) hypertension: Secondary | ICD-10-CM

## 2020-07-08 ENCOUNTER — Telehealth: Payer: Self-pay | Admitting: Dietician

## 2020-07-08 NOTE — Telephone Encounter (Signed)
Wants to know what she should do about being nervous. She said she would call the doctor.

## 2020-07-08 NOTE — Telephone Encounter (Signed)
I called patient and left a message that she should call her doctor about being nervous. Note that she was recently mailed a list of resources and providers for mental health and is added to the list for our virtual behavioral health clinician to contact her.

## 2020-07-17 ENCOUNTER — Other Ambulatory Visit: Payer: Self-pay

## 2020-07-17 ENCOUNTER — Ambulatory Visit (INDEPENDENT_AMBULATORY_CARE_PROVIDER_SITE_OTHER): Payer: Medicare Other | Admitting: Internal Medicine

## 2020-07-17 ENCOUNTER — Telehealth: Payer: Self-pay | Admitting: *Deleted

## 2020-07-17 ENCOUNTER — Encounter: Payer: Self-pay | Admitting: Internal Medicine

## 2020-07-17 VITALS — Wt 274.2 lb

## 2020-07-17 DIAGNOSIS — R1013 Epigastric pain: Secondary | ICD-10-CM

## 2020-07-17 DIAGNOSIS — R1011 Right upper quadrant pain: Secondary | ICD-10-CM

## 2020-07-17 DIAGNOSIS — R109 Unspecified abdominal pain: Secondary | ICD-10-CM | POA: Insufficient documentation

## 2020-07-17 DIAGNOSIS — E119 Type 2 diabetes mellitus without complications: Secondary | ICD-10-CM

## 2020-07-17 DIAGNOSIS — R110 Nausea: Secondary | ICD-10-CM | POA: Insufficient documentation

## 2020-07-17 LAB — CBC WITH DIFFERENTIAL/PLATELET
Abs Immature Granulocytes: 0.02 10*3/uL (ref 0.00–0.07)
Basophils Absolute: 0 10*3/uL (ref 0.0–0.1)
Basophils Relative: 0 %
Eosinophils Absolute: 0 10*3/uL (ref 0.0–0.5)
Eosinophils Relative: 1 %
HCT: 42.1 % (ref 36.0–46.0)
Hemoglobin: 13 g/dL (ref 12.0–15.0)
Immature Granulocytes: 0 %
Lymphocytes Relative: 19 %
Lymphs Abs: 1.3 10*3/uL (ref 0.7–4.0)
MCH: 26.7 pg (ref 26.0–34.0)
MCHC: 30.9 g/dL (ref 30.0–36.0)
MCV: 86.6 fL (ref 80.0–100.0)
Monocytes Absolute: 0.3 10*3/uL (ref 0.1–1.0)
Monocytes Relative: 4 %
Neutro Abs: 5.1 10*3/uL (ref 1.7–7.7)
Neutrophils Relative %: 76 %
Platelets: 177 10*3/uL (ref 150–400)
RBC: 4.86 MIL/uL (ref 3.87–5.11)
RDW: 14.4 % (ref 11.5–15.5)
WBC: 6.7 10*3/uL (ref 4.0–10.5)
nRBC: 0 % (ref 0.0–0.2)

## 2020-07-17 LAB — COMPREHENSIVE METABOLIC PANEL
ALT: 14 U/L (ref 0–44)
AST: 21 U/L (ref 15–41)
Albumin: 4 g/dL (ref 3.5–5.0)
Alkaline Phosphatase: 86 U/L (ref 38–126)
Anion gap: 10 (ref 5–15)
BUN: 9 mg/dL (ref 8–23)
CO2: 27 mmol/L (ref 22–32)
Calcium: 9.7 mg/dL (ref 8.9–10.3)
Chloride: 101 mmol/L (ref 98–111)
Creatinine, Ser: 0.85 mg/dL (ref 0.44–1.00)
GFR calc non Af Amer: >60 mL/min (ref >60)
Glucose, Bld: 127 mg/dL — ABNORMAL HIGH (ref 70–99)
Potassium: 4.3 mmol/L (ref 3.5–5.1)
Sodium: 138 mmol/L (ref 135–145)
Total Bilirubin: 0.3 mg/dL (ref 0.3–1.2)
Total Protein: 8.5 g/dL — ABNORMAL HIGH (ref 6.5–8.1)

## 2020-07-17 LAB — POCT GLYCOSYLATED HEMOGLOBIN (HGB A1C): Hemoglobin A1C: 6.5 % — AB (ref 4.0–5.6)

## 2020-07-17 LAB — LIPASE, BLOOD: Lipase: 28 U/L (ref 11–51)

## 2020-07-17 LAB — GLUCOSE, CAPILLARY: Glucose-Capillary: 105 mg/dL — ABNORMAL HIGH (ref 70–99)

## 2020-07-17 MED ORDER — SENNOSIDES-DOCUSATE SODIUM 8.6-50 MG PO TABS: 2.0000 | ORAL_TABLET | Freq: Two times a day (BID) | ORAL | 0 refills | Status: AC

## 2020-07-17 MED ORDER — POLYETHYLENE GLYCOL 3350 17 G PO PACK: 17.0000 g | PACK | Freq: Every day | ORAL | 0 refills | Status: DC

## 2020-07-17 MED ORDER — OMEPRAZOLE 40 MG PO CPDR: 40.0000 mg | DELAYED_RELEASE_CAPSULE | Freq: Every day | ORAL | 1 refills | Status: DC

## 2020-07-17 NOTE — Assessment & Plan Note (Addendum)
Patient presents with 3 week history of progressive, intermittent abdominal pain with associated nausea. Pain will come and go without obvious provoking factors. Does get better when she eats. She denies any vomiting, hematochezia or melena. She does endorse difficulty moving her bowels the last few weeks as well.  Additionally, she endorses dizziness with standing described as near syncopal.   Assessment: Symptoms most concerning for gastic or duodenal ulcer with associated GI bleed causing her dizziness. Differential also includes pancreatitis vs biliary disease.   Plan -STAT CMP, CBC, lipase -send home with stool collection kit to rule out H. pylori  -start bowel regimen as her symptoms may all be related to constipation   Update:  -lab work unremarkable. Will start bowel regimen, collect stool sample for H. Pylori, and start PPI. She has a follow-up with PCP in a few weeks, but strict return precautions given if she develops new or worsening symptoms.

## 2020-07-17 NOTE — Telephone Encounter (Signed)
Thank you. I agree 

## 2020-07-17 NOTE — Patient Instructions (Addendum)
Ms. Bickham, I'm sorry you haven't been feeling well.   The good news is, all of your labs are normal!   I think you will feel better if we can get your bowel to move more regularly.  I'd like you to try the prescribed regimen I have sent in (can likely be purchased over the counter).  Start taking Omeprazole 40 mg daily to see if this helps your nausea and upper abdominal pain.   Increase your fluid intake and get up slowly when you go to stand.   You have an appointment with Dr. Dareen Piano early next month which is perfect timing to see how you are doing.   Please call sooner if you develop worsening symptoms or have other concerns.   Take care!

## 2020-07-17 NOTE — Telephone Encounter (Signed)
Patient called in c/o 2 day hx of nausea, slight h/a, and dizziness. Denies cough, SHOB, fevers. No sick contacts. Appt given this afternoon with [employer]. Hubbard Hartshorn, BSN, RN-BC
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
## 2020-07-18 ENCOUNTER — Encounter: Payer: Self-pay | Admitting: Internal Medicine

## 2020-07-20 ENCOUNTER — Other Ambulatory Visit: Payer: Self-pay | Admitting: Internal Medicine

## 2020-07-20 DIAGNOSIS — I10 Essential (primary) hypertension: Secondary | ICD-10-CM

## 2020-07-21 ENCOUNTER — Other Ambulatory Visit: Payer: Medicare Other

## 2020-07-21 DIAGNOSIS — R1013 Epigastric pain: Secondary | ICD-10-CM | POA: Diagnosis not present

## 2020-07-21 NOTE — Telephone Encounter (Signed)
Next appt scheduled 11/2 with PCP. 

## 2020-07-22 NOTE — Progress Notes (Signed)
Internal Medicine Clinic Attending  Case discussed with Dr. Bloomfield  At the time of the visit.  We reviewed the resident's history and exam and pertinent patient test results.  I agree with the assessment, diagnosis, and plan of care documented in the resident's note.  

## 2020-07-23 LAB — H. PYLORI ANTIGEN, STOOL: H pylori Ag, Stl: NEGATIVE

## 2020-08-04 ENCOUNTER — Telehealth: Payer: Self-pay | Admitting: Internal Medicine

## 2020-08-04 NOTE — Telephone Encounter (Signed)
Return pt's call - states she takes Omeprazole in the am for nausea which is once daily; and sometimes at night around bedtime she has nausea. Wants to know if she can take another one at bedtime or something else OTC?

## 2020-08-04 NOTE — Telephone Encounter (Signed)
Thank you :)

## 2020-08-04 NOTE — Telephone Encounter (Signed)
I am unsure why she would be nauseous. Omeprazole helps with reflux and should not help with nausea. Can you ask her to schedule a tele visit so we can explore her symptoms further? Thank you

## 2020-08-04 NOTE — Telephone Encounter (Signed)
Called pt - she's agreeable to schedule a telehealth appt tomorrow @ 1015 AM with Dr Lisabeth Devoid to discuss her c/o nausea.

## 2020-08-04 NOTE — Telephone Encounter (Signed)
Pt has questions about her  medication making her feel nauseous after taking it.  omeprazole (PRILOSEC) 40 MG capsule  [id number] - [place], [place] - [address] AT [place]

## 2020-08-05 ENCOUNTER — Ambulatory Visit (INDEPENDENT_AMBULATORY_CARE_PROVIDER_SITE_OTHER): Payer: Medicare Other | Admitting: Student

## 2020-08-05 ENCOUNTER — Other Ambulatory Visit: Payer: Self-pay

## 2020-08-05 DIAGNOSIS — R110 Nausea: Secondary | ICD-10-CM | POA: Diagnosis not present

## 2020-08-05 MED ORDER — SENNOSIDES-DOCUSATE SODIUM 8.6-50 MG PO TABS: 1.0000 | ORAL_TABLET | Freq: Every day | ORAL | 3 refills | Status: AC

## 2020-08-05 NOTE — Progress Notes (Signed)
   CC: Nausea  This is a telephone encounter between Christine [name] and Christine [name] on 08/05/2020 for nausea that started about 1 month ago. The visit was conducted with the patient located at home and Christine [name] at [facility]. The patient's identity was confirmed using their DOB and current address. The patient has consented to being evaluated through a telephone encounter and understands the associated risks (an examination cannot be done and the patient may need to come in for an appointment) / benefits (allows the patient to remain at home, decreasing exposure to coronavirus). I personally spent 15 minutes on medical discussion.   HPI:  [name] is a 84 y.o. with PMH as below.   Please see A&P for assessment of the patient's acute and chronic medical conditions.   Past Medical History:  Diagnosis Date  . Arthritis   . Cataract   . Depression   . Grief reaction 10/17/2018  . Hip pain, acute, right 01/30/2018  . Hyperlipemia   . Hypertension   . Impaired glucose tolerance   . Morbidly obese ([place])   . Pruritus   . Seasonal allergies   . Thyroid nodule    Review of Systems:  Nausea a few hours after eating, improves with bowel movements. Constipation. No fever, chills, abdominal pain, vomiting, diarrhea, chest pain, SOB, dysuria, dizziness or weakness.    Assessment & Plan:   See Encounters Tab for problem based charting.  Patient seen with Dr. Jimmye Norman

## 2020-08-07 NOTE — Assessment & Plan Note (Signed)
Patient follow up on visit on 07/17/2020. States she continue to have nausea about twice a day. Feels that it starts a couple hours after eating and improves with bowel movements. She has been taking omeprazole and senna. States the senna helped with constipation and symptoms of nausea, but not is currently out of medications. Epigastric pain now improved since starting the omeprazole, but continue to have frequent nausea. Feels that this is worse now that she is out of her senna and feels more constipated. She denies fever, vomiting, abdominal pain, dizziness and diarrhea. She is able to to keep down food and water but develops nausea a couple hours after eating and generally feels full quickly. Symptoms may be due to constipation, but also concerning for possible for biliary colic given timing of her symptoms although she has no abdominal pain.    Plan Continue her on senna for constipation as this helped her nausea Instructed her to avoid greasy and fried foods  Consider RUQ [place] to evaluate for biliary disease on follow up office visit on 08/12/2020 if she continues to have nausea

## 2020-08-07 NOTE — Progress Notes (Signed)
Internal Medicine Clinic Attending  Case discussed with Dr. Lisabeth Devoid  At the time of the visit.  We reviewed the patient's history and I agree with the assessment, diagnosis, and plan of care documented in the resident's note. Differential currently includes but is not limited to constipation vs biliary disease given nonspecificity of her symptoms.  No red flags, treat constipation and monitor.

## 2020-08-09 ENCOUNTER — Other Ambulatory Visit: Payer: Self-pay | Admitting: Internal Medicine

## 2020-08-12 ENCOUNTER — Ambulatory Visit (INDEPENDENT_AMBULATORY_CARE_PROVIDER_SITE_OTHER): Payer: Medicare Other | Admitting: Internal Medicine

## 2020-08-12 ENCOUNTER — Encounter: Payer: Self-pay | Admitting: Internal Medicine

## 2020-08-12 ENCOUNTER — Encounter: Payer: Medicare Other | Admitting: Internal Medicine

## 2020-08-12 VITALS — BP 140/62 | HR 94 | Temp 98.4°F | Wt 270.2 lb

## 2020-08-12 DIAGNOSIS — R110 Nausea: Secondary | ICD-10-CM

## 2020-08-12 MED ORDER — ONDANSETRON HCL 4 MG PO TABS: 4.0000 mg | ORAL_TABLET | Freq: Every day | ORAL | 0 refills | Status: DC | PRN

## 2020-08-12 NOTE — Assessment & Plan Note (Addendum)
HPI: Patient presents with abdominal pain and nausea which started mid September.  She has been seen in clinic twice since this started.  She was constipated and started on senna docusate.  She was also having heartburn and started on Protonix.  H. pylori testing was negative.  CMP within normal limits.  Reports her bowel movements have improved and her heartburn has improved on treatment.  She continues to have nausea and epigastric pain which lasts about 20 minutes.  She is experiencing dizziness with this at times, hypertensive on exam with pulse of 94.  Checks blood sugars at home and never has been hypoglycemic during this episode.  This is not associated with what she eats or timing around eating.  She experiences the abdominal pain sitting in a chair and also laying in the bed.  Her bowel movements have been smaller in caliber and sometimes have a beige color.    No colonoscopy or endoscopy on file. Last colonoscopy in 2009 at [facility] per chart review.Her mother died of colon cancer around the age of 40.   Assessment: Nausea with associated dizziness.  Patient symptoms have been episodic and treating her constipations seems like a reasonable first step.   Plan: -Given she continues to have abdominal pain we will get a RUQ ultrasound today to evaluate gallbladder and liver. -Continue Protonix and senna docusate

## 2020-08-12 NOTE — Progress Notes (Signed)
   CC: nausea  HPI:[name] is a 84 y.o. female who presents for evaluation of nausea. Please see individual problem based A/P for details.  Past Medical History:  Diagnosis Date  . Arthritis   . Cataract   . Depression   . Grief reaction 10/17/2018  . Hip pain, acute, right 01/30/2018  . Hyperlipemia   . Hypertension   . Impaired glucose tolerance   . Morbidly obese ([place])   . Pruritus   . Seasonal allergies   . Thyroid nodule    Review of Systems:   Review of Systems  Constitutional: Negative for chills and fever.  Gastrointestinal: Positive for abdominal pain and nausea. Negative for blood in stool, constipation, diarrhea, melena and vomiting.  Neurological: Positive for dizziness. Negative for loss of consciousness and weakness.     Physical Exam: Vitals:   08/12/20 0907  BP: 140/62  Pulse: 94  Temp: 98.4 F (36.9 C)  TempSrc: Oral  SpO2: 99%  Weight: 270 lb 3.2 oz (122.6 kg)   General: NAD, nl appearance HEENT: Normocephalic, atraumatic , Conjunctiva nl  Cardiovascular: Normal rate, regular rhythm.  No murmurs, rubs, or gallops Pulmonary : Equal breath sounds, No wheezes, rales, or rhonchi Abdominal: Active bowel sounds, epigastric pain on deep palpation, no guarding or rebound tenderness   Assessment & Plan:   See Encounters Tab for problem based charting.  Patient discussed with Dr. Angelia Mould

## 2020-08-12 NOTE — Patient Instructions (Addendum)
  1. Nausea Given your nausea has not gone away with omeprazole and senna docusate we will order a right upper quadrant ultrasound.  I prescribe Zofran, this can be used to help with your symptom of nausea.  I will follow up with the test result.  - US Abdomen Limited RUQ (LIVER/GB); Future - ondansetron (ZOFRAN) 4 MG tablet; Take 1 tablet (4 mg total) by mouth daily as needed for nausea or vomiting.  Dispense: 10 tablet; Refill: 0

## 2020-08-15 NOTE — Progress Notes (Signed)
[facility] Attending  Case discussed with Dr. Steen  At the time of the visit.  We reviewed the resident's history and exam and pertinent patient test results.  I agree with the assessment, diagnosis, and plan of care documented in the resident's note.  

## 2020-08-19 ENCOUNTER — Ambulatory Visit (INDEPENDENT_AMBULATORY_CARE_PROVIDER_SITE_OTHER): Payer: Medicare Other | Admitting: Dietician

## 2020-08-19 ENCOUNTER — Other Ambulatory Visit: Payer: Self-pay

## 2020-08-19 ENCOUNTER — Encounter: Payer: Self-pay | Admitting: Dietician

## 2020-08-19 DIAGNOSIS — E119 Type 2 diabetes mellitus without complications: Secondary | ICD-10-CM

## 2020-08-19 NOTE — Progress Notes (Signed)
Medical Nutrition Therapy : 0820 end time:  0847. Total time: 27 Visit # 2  Assessment:  Primary concerns today: blood sugar control, new to diabetes in June 2021. She states that she now feels better,knee is better, walking more and sitting less.  Her a1c decreased from 7.2 to 6.5%, achieving her goals of lowering her blood sugars with lifestyle change.Her weight has decreased by 24# since diagnosis in June 2021.   ANTHROPOMETRICS:reports her weight this week is 276#; Estimated body mass index is 40.21 kg/m as calculated from the following:   Height as of 03/14/20: 5' 8.25" (1.734 m).   Weight as of this encounter: 266 lb 6.4 oz (120.8 kg).  WEIGHT HISTORY:  Wt Readings from Last 10 Encounters:  08/19/20 266 lb 6.4 oz (120.8 kg)  08/12/20 270 lb 3.2 oz (122.6 kg)  07/17/20 274 lb 3.2 oz (124.4 kg)  06/24/20 276 lb (125.2 kg)  04/29/20 288 lb 12.8 oz (131 kg)  04/08/20 290 lb 1.6 oz (131.6 kg)  03/25/20 292 lb 1.6 oz (132.5 kg)  03/14/20 293 lb (132.9 kg)  03/04/20 295 lb (133.8 kg)  12/11/19 293 lb 6.4 oz (133.1 kg)    Sleep- 630-7 Pm until 12 midnight   BLOOD SUGAR: meter brought with 90 day average of 129 and 108 this am fasting when she checked at homehaving problems with error messages and using several strips Lab Results  Component Value Date   HGBA1C 6.5 (A) 07/17/2020   HGBA1C 7.1 (A) 03/04/2020   HGBA1C 6.9 (A) 12/11/2019   HGBA1C 6.7 (A) 08/14/2019   HGBA1C 6.6 (A) 10/17/2018    . DIETARY INTAKE: Usual eating pattern includes 3 meals and 0-1 snacks per day. Everyday foods include coffee, water, vegetables,  eggs, bread. .  , Constipation:need to reassess , Dining Out (times/week): seldom 24-hr recall:  630 Am boiled egg, chicken strips, 1/2 piece toast, water 1130-1200- chicken hot dog, string beans, baked potato or whole wheat bread 5 PM- Buttermilk & cornbread-  homemade size of a deck of cards Snacks- apples, pears, grapes,bnanas Beverages: water, coffee,  milk  Usual physical activity: sit and be fit -everyday for 30 minutes     Walks a mile 5 days- 30-35 minutes   Progress Towards Goal(s):  Some progress.   Nutritional Diagnosis:  NB-1.1 Food and nutrition-related knowledge deficit As related to lack of sufficient prior diabetes and meal planning education shows improvement  As evidenced by her report, A1C and weight decrease.     Intervention:  Nutrition education and support about taking care of her diabetes, .  Action Goals: checking blood glucose- as she is able- plan to see if I can help her with the problems she is having             Exercise 3 times a week -now exercising daily             Eating smaller portions- doing well as shown by food recall and weight loss  Outcome goal: lower blood sugars has been met- now in maintenance stage Coordination of care: ask front office to make her an appointment  For February, eye exam appointment 08-27-20 with Dr. Groat. Teaching Method Utilized: Visual, Auditory,Hands on Handouts given during visit include: After visit summary Barriers to learning/adherence to lifestyle change: anxiety  Demonstrated degree of understanding via:  Teach Back   Monitoring/Evaluation:  Dietary intake, exercise, meter, and body weight in 3 month(s). Donna Plyler, RD 08/19/2020 8:56 AM.        

## 2020-08-19 NOTE — Patient Instructions (Signed)
You are doing an excellent job at [employer], exercise and diabetes!   I'll be in touch about the error message on your meter.  When you run out of strips, take a break at checking your blood sugar until you can get more strips from the pharmacy without paying for them.   We'll follow up on February 9th at 815 by telephone. I will call you.  [name] [phone]

## 2020-08-27 DIAGNOSIS — H16223 Keratoconjunctivitis sicca, not specified as Sjogren's, bilateral: Secondary | ICD-10-CM | POA: Diagnosis not present

## 2020-08-27 DIAGNOSIS — H3562 Retinal hemorrhage, left eye: Secondary | ICD-10-CM | POA: Diagnosis not present

## 2020-08-27 DIAGNOSIS — H43821 Vitreomacular adhesion, right eye: Secondary | ICD-10-CM | POA: Diagnosis not present

## 2020-08-27 DIAGNOSIS — H43393 Other vitreous opacities, bilateral: Secondary | ICD-10-CM | POA: Diagnosis not present

## 2020-08-27 DIAGNOSIS — H25813 Combined forms of age-related cataract, bilateral: Secondary | ICD-10-CM | POA: Diagnosis not present

## 2020-08-27 DIAGNOSIS — R7309 Other abnormal glucose: Secondary | ICD-10-CM | POA: Diagnosis not present

## 2020-08-28 ENCOUNTER — Ambulatory Visit (HOSPITAL_COMMUNITY): Payer: Medicare Other
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
## 2020-09-01 ENCOUNTER — Ambulatory Visit (HOSPITAL_COMMUNITY)
Admission: RE | Admit: 2020-09-01 | Discharge: 2020-09-01 | Disposition: A | Discharge status: Home / Self Care | Payer: Medicare Other | Source: Ambulatory Visit | Attending: Internal Medicine | Admitting: Internal Medicine

## 2020-09-01 ENCOUNTER — Other Ambulatory Visit: Payer: Self-pay

## 2020-09-01 DIAGNOSIS — R1013 Epigastric pain: Secondary | ICD-10-CM | POA: Diagnosis not present

## 2020-09-01 DIAGNOSIS — K8020 Calculus of gallbladder without cholecystitis without obstruction: Secondary | ICD-10-CM | POA: Diagnosis not present

## 2020-09-01 DIAGNOSIS — R110 Nausea: Secondary | ICD-10-CM | POA: Diagnosis not present

## 2020-09-02 ENCOUNTER — Ambulatory Visit (INDEPENDENT_AMBULATORY_CARE_PROVIDER_SITE_OTHER): Payer: Medicare Other | Admitting: Internal Medicine

## 2020-09-02 ENCOUNTER — Encounter: Payer: Self-pay | Admitting: Internal Medicine

## 2020-09-02 VITALS — BP 161/72 | HR 90 | Temp 98.2°F | Wt 262.5 lb

## 2020-09-02 DIAGNOSIS — M1711 Unilateral primary osteoarthritis, right knee: Secondary | ICD-10-CM | POA: Diagnosis not present

## 2020-09-02 DIAGNOSIS — F32A Depression, unspecified: Secondary | ICD-10-CM | POA: Diagnosis not present

## 2020-09-02 DIAGNOSIS — Z0000 Encounter for general adult medical examination without abnormal findings: Secondary | ICD-10-CM

## 2020-09-02 DIAGNOSIS — E1169 Type 2 diabetes mellitus with other specified complication: Secondary | ICD-10-CM | POA: Diagnosis not present

## 2020-09-02 DIAGNOSIS — I10 Essential (primary) hypertension: Secondary | ICD-10-CM

## 2020-09-02 DIAGNOSIS — M1120 Other chondrocalcinosis, unspecified site: Secondary | ICD-10-CM

## 2020-09-02 DIAGNOSIS — Z6841 Body Mass Index (BMI) 40.0 and over, adult: Secondary | ICD-10-CM

## 2020-09-02 DIAGNOSIS — K219 Gastro-esophageal reflux disease without esophagitis: Secondary | ICD-10-CM | POA: Diagnosis not present

## 2020-09-02 DIAGNOSIS — R110 Nausea: Secondary | ICD-10-CM

## 2020-09-02 MED ORDER — DICLOFENAC SODIUM 1 % EX GEL: CUTANEOUS | 4 refills | Status: DC

## 2020-09-02 NOTE — Progress Notes (Signed)
   Subjective:    Patient ID: Christine [name], female    DOB: Dec 17, 1935, 84 y.o.   MRN: [id number]  HPI  I have seen and examined this patient. Patient is here for routine follow-up of her hypertension and diabetes.  Patient states that she is anxious about getting her Covid vaccine and is worried that it may cause side effects. She states that she is compliant with all her medications. She does complain of intermittent nausea but otherwise feels well.  Review of Systems  Constitutional: Negative.   HENT: Negative.   Respiratory: Negative.   Cardiovascular: Negative.   Gastrointestinal: Positive for nausea.  Musculoskeletal: Positive for arthralgias. Negative for gait problem and myalgias.  Neurological: Negative.   Psychiatric/Behavioral: Negative.        Objective:   Physical Exam Constitutional:      Appearance: Normal appearance.  HENT:     Head: Normocephalic and atraumatic.  Cardiovascular:     Rate and Rhythm: Normal rate and regular rhythm.     Heart sounds: Normal heart sounds.  Pulmonary:     Effort: Pulmonary effort is normal.     Breath sounds: Normal breath sounds. No wheezing or rales.  Abdominal:     General: Bowel sounds are normal. There is no distension.     Palpations: Abdomen is soft.     Tenderness: There is no abdominal tenderness.  Musculoskeletal:        General: No swelling or tenderness.  Neurological:     General: No focal deficit present.     Mental Status: She is alert and oriented to person, place, and time.  Psychiatric:        Mood and Affect: Mood normal.        Behavior: Behavior normal.           Assessment & Plan:  Please see problem based charting for assessment and plan:

## 2020-09-02 NOTE — Assessment & Plan Note (Signed)
>>  ASSESSMENT AND PLAN FOR OSTEOARTHRITIS OF RIGHT KNEE WRITTEN ON 09/02/2020 10:35 AM BY NARENDRA, NISCHAL, MD  -This problem is chronic and stable -Patient has chronic bilateral knee pain and was supposed to have bilateral knee replacement with Ortho but needs to lose weight prior to this -Given the patient has lost approximately 30 pounds over the last 6 to 7 months she may be a better candidate currently and will follow up with orthopedic doctor for this -We will continue with Voltaren gel for pain control -Patient will continue with diet and exercise

## 2020-09-02 NOTE — Assessment & Plan Note (Signed)
-  This problem is chronic and improving -Patient states that she has intermittent episodes of depression since her son passed away and that she has "good days and bad days " -She also has anxiety about Covid -Patient last PHQ-9 score was 19 but this is improved to 2 today -We will continue to monitor closely

## 2020-09-02 NOTE — Assessment & Plan Note (Signed)
-  Patient was started on omeprazole for possible heartburn contributing to her nausea -She states that she is not having any heartburn symptoms and is uncertain if this is what is causing her nausea -We will have patient hold her omeprazole to see if she has worsening nausea off of this medication. If she does have heartburn symptoms or worsening nausea I have instructed her to restart this medication

## 2020-09-02 NOTE — Assessment & Plan Note (Signed)
-  Patient states that she has had intermittent nausea since September -Etiology behind her nausea remains uncertain at this time but she was started on senna for constipation as well as Protonix for possible heartburn. Her H. pylori testing was negative. She also had an ultrasound of her right upper quadrant which showed small gallstones but was otherwise within normal limits -Patient states that her nausea has improved and is now only intermittent. She states that the nausea resolves after having a bowel movement. This may be secondary to her underlying constipation. We talked about changes in diet that would help with her constipation as well as continue with senna at this time -If patient continues to have persistent nausea we will have patient follow-up with GI for possible EGD -Patient also has weight loss of approximately 30 pounds in the last 6 to 7 months. She states that this has been intentional but if her weight loss continues at a rapid pace and she has intermittent nausea she will need an EGD/colonoscopy to rule out possible underlying malignancy as an etiology for his symptoms

## 2020-09-02 NOTE — Assessment & Plan Note (Signed)
-  This problems chronic and improving -Patient's weight has decreased from 295 pounds back in May to 262 pounds currently -Patient states that she has been following a strict diet and avoiding sweets as well as exercising and that the weight loss is intentional -Patient was congratulated on the weight loss and was advised to continue her diet and excise -We will follow-up weight at next visit

## 2020-09-02 NOTE — Patient Instructions (Signed)
-  It was a pleasure seeing you today -We will check a urine test on you to look for protein in your urine -Please get your Covid vaccine. Ms. Gwinda Maine will give you the numbers to contact to get this done. You can also get this done at your local pharmacy -Please continue to follow your diet and exercise. You have lost approximately 30 pounds in the last 6 to 7 months. -If your nausea is persisting despite improvement in your constipation we will refer you to gastroenterology to get an endoscopy to rule out any other causes of your nausea -Please call me with any questions or concerns -Have a great Thanksgiving!

## 2020-09-02 NOTE — Assessment & Plan Note (Signed)
-  Patient has a history of pseudogout for which she follows with rheumatology (Dr. Patrecia Pour) -Patient was started on colchicine daily for her pseudogout and OA -Patient states that she uses colchicine only intermittently as needed for pain control and states that her pain overall has improved since she has been losing weight -No further work-up at this time -We will continue to monitor

## 2020-09-02 NOTE — Assessment & Plan Note (Signed)
-  This problem is chronic and stable -Patient has chronic bilateral knee pain and was supposed to have bilateral knee replacement with Ortho but needs to lose weight prior to this -Given the patient has lost approximately 30 pounds over the last 6 to 7 months she may be a better candidate currently and will follow up with orthopedic doctor for this -We will continue with Voltaren gel for pain control -Patient will continue with diet and exercise

## 2020-09-02 NOTE — Assessment & Plan Note (Signed)
BP Readings from Last 3 Encounters:  09/02/20 (!) 161/72  08/12/20 140/62  03/14/20 (!) 153/75    Lab Results  Component Value Date   NA 138 07/17/2020   K 4.3 07/17/2020   CREATININE 0.85 07/17/2020    Assessment: Blood pressure control:  Well-controlled Progress toward BP goal:   At goal Comments: Patient's repeat blood pressure was 138/77. Patient is compliant with spironolactone 25 mg daily as well as amlodipine 5 mg daily  Plan: Medications:  continue current medications Educational resources provided:   Self management tools provided:   Other plans: We will check BMP at follow-up visit

## 2020-09-02 NOTE — Assessment & Plan Note (Signed)
-  This problem is chronic and stable -Patient's last A1c was 6.5 last month -Patient's glucometer shows readings in the 90s to 100s with a couple of readings in the 200s -We will hold off on initiating medication at this time -Patient is following a strict diet and exercising and has lost approximately 30 pounds over the last 6 to 7 months -Patient was encouraged to continue with her diet and exercise and this will likely improve her A1c without medication -We will check her urine microalbumin today

## 2020-09-02 NOTE — Assessment & Plan Note (Signed)
-  We had a long discussion about the Covid vaccine and importance of getting this vaccine -Patient shared that she was scared of getting the vaccine as she was worried that it may cause a lot of pain and the last time she had a flu shot she had a reaction at the site -Explained to the patient that she is at high risk of severe Covid infection if she does get Covid and that the vaccines are safe and effective. I also explained to her that they decrease the risk of getting the infection as well as decreasing the risk of severe infection if she does get a breakthrough infection -Patient is interested in getting the Covid vaccine and we will get her the numbers at the health department contact to get this done. I also explained to the patient that she could get this done at her pharmacy but she would prefer getting it done at the health department

## 2020-09-03 LAB — MICROALBUMIN / CREATININE URINE RATIO
Creatinine, Urine: 163.8 mg/dL
Microalb/Creat Ratio: 7 mg/g creat (ref 0–29)
Microalbumin, Urine: 11.2 ug/mL

## 2020-09-03 NOTE — Progress Notes (Signed)
Called patient and reviewed results of RUQ ultrasound. Tiny gallstones note, but no apparent reason for patients nausea. She reports nausea is improving. She saw her PCP yesterday who has plans to refer for EGD and colonoscopy.

## 2020-09-12 ENCOUNTER — Ambulatory Visit (INDEPENDENT_AMBULATORY_CARE_PROVIDER_SITE_OTHER): Payer: Medicare Other | Admitting: Physician Assistant

## 2020-09-12 ENCOUNTER — Encounter: Payer: Self-pay | Admitting: Physician Assistant

## 2020-09-12 ENCOUNTER — Other Ambulatory Visit: Payer: Self-pay

## 2020-09-12 VITALS — BP 149/78 | HR 86 | Resp 17 | Ht 68.25 in | Wt 262.0 lb

## 2020-09-12 DIAGNOSIS — M19041 Primary osteoarthritis, right hand: Secondary | ICD-10-CM | POA: Diagnosis not present

## 2020-09-12 DIAGNOSIS — L409 Psoriasis, unspecified: Secondary | ICD-10-CM | POA: Diagnosis not present

## 2020-09-12 DIAGNOSIS — Z8719 Personal history of other diseases of the digestive system: Secondary | ICD-10-CM | POA: Diagnosis not present

## 2020-09-12 DIAGNOSIS — M160 Bilateral primary osteoarthritis of hip: Secondary | ICD-10-CM | POA: Diagnosis not present

## 2020-09-12 DIAGNOSIS — Z8679 Personal history of other diseases of the circulatory system: Secondary | ICD-10-CM

## 2020-09-12 DIAGNOSIS — M19042 Primary osteoarthritis, left hand: Secondary | ICD-10-CM

## 2020-09-12 DIAGNOSIS — M7061 Trochanteric bursitis, right hip: Secondary | ICD-10-CM | POA: Diagnosis not present

## 2020-09-12 DIAGNOSIS — Z8639 Personal history of other endocrine, nutritional and metabolic disease: Secondary | ICD-10-CM | POA: Diagnosis not present

## 2020-09-12 DIAGNOSIS — M1120 Other chondrocalcinosis, unspecified site: Secondary | ICD-10-CM | POA: Diagnosis not present

## 2020-09-12 DIAGNOSIS — R7303 Prediabetes: Secondary | ICD-10-CM

## 2020-09-12 DIAGNOSIS — M1711 Unilateral primary osteoarthritis, right knee: Secondary | ICD-10-CM | POA: Diagnosis not present

## 2020-09-12 NOTE — Patient Instructions (Signed)
Hand Exercises Hand exercises can be helpful for almost anyone. These exercises can strengthen the hands, improve flexibility and movement, and increase blood flow to the hands. These results can make work and daily tasks easier. Hand exercises can be especially helpful for people who have joint pain from arthritis or have nerve damage from overuse (carpal tunnel syndrome). These exercises can also help people who have injured a hand. Exercises Most of these hand exercises are gentle stretching and motion exercises. It is usually safe to do them often throughout the day. Warming up your hands before exercise may help to reduce stiffness. You can do this with gentle massage or by placing your hands in warm water for 10-15 minutes. It is normal to feel some stretching, pulling, tightness, or mild discomfort as you begin new exercises. This will gradually improve. Stop an exercise right away if you feel sudden, severe pain or your pain gets worse. Ask your health care provider which exercises are best for you. Knuckle bend or "claw" fist 1. Stand or sit with your arm, hand, and all five fingers pointed straight up. Make sure to keep your wrist straight during the exercise. 2. Gently bend your fingers down toward your palm until the tips of your fingers are touching the top of your palm. Keep your big knuckle straight and just bend the small knuckles in your fingers. 3. Hold this position for __________ seconds. 4. Straighten (extend) your fingers back to the starting position. Repeat this exercise 5-10 times with each hand. Full finger fist 1. Stand or sit with your arm, hand, and all five fingers pointed straight up. Make sure to keep your wrist straight during the exercise. 2. Gently bend your fingers into your palm until the tips of your fingers are touching the middle of your palm. 3. Hold this position for __________ seconds. 4. Extend your fingers back to the starting position, stretching every  joint fully. Repeat this exercise 5-10 times with each hand. Straight fist 1. Stand or sit with your arm, hand, and all five fingers pointed straight up. Make sure to keep your wrist straight during the exercise. 2. Gently bend your fingers at the big knuckle, where your fingers meet your hand, and the middle knuckle. Keep the knuckle at the tips of your fingers straight and try to touch the bottom of your palm. 3. Hold this position for __________ seconds. 4. Extend your fingers back to the starting position, stretching every joint fully. Repeat this exercise 5-10 times with each hand. Tabletop 1. Stand or sit with your arm, hand, and all five fingers pointed straight up. Make sure to keep your wrist straight during the exercise. 2. Gently bend your fingers at the big knuckle, where your fingers meet your hand, as far down as you can while keeping the small knuckles in your fingers straight. Think of forming a tabletop with your fingers. 3. Hold this position for __________ seconds. 4. Extend your fingers back to the starting position, stretching every joint fully. Repeat this exercise 5-10 times with each hand. Finger spread 1. Place your hand flat on a table with your palm facing down. Make sure your wrist stays straight as you do this exercise. 2. Spread your fingers and thumb apart from each other as far as you can until you feel a gentle stretch. Hold this position for __________ seconds. 3. Bring your fingers and thumb tight together again. Hold this position for __________ seconds. Repeat this exercise 5-10 times with each hand.   Making circles 1. Stand or sit with your arm, hand, and all five fingers pointed straight up. Make sure to keep your wrist straight during the exercise. 2. Make a circle by touching the tip of your thumb to the tip of your index finger. 3. Hold for __________ seconds. Then open your hand wide. 4. Repeat this motion with your thumb and each finger on your  hand. Repeat this exercise 5-10 times with each hand. Thumb motion 1. Sit with your forearm resting on a table and your wrist straight. Your thumb should be facing up toward the ceiling. Keep your fingers relaxed as you move your thumb. 2. Lift your thumb up as high as you can toward the ceiling. Hold for __________ seconds. 3. Bend your thumb across your palm as far as you can, reaching the tip of your thumb for the small finger (pinkie) side of your palm. Hold for __________ seconds. Repeat this exercise 5-10 times with each hand. Grip strengthening  1. Hold a stress ball or other soft ball in the middle of your hand. 2. Slowly increase the pressure, squeezing the ball as much as you can without causing pain. Think of bringing the tips of your fingers into the middle of your palm. All of your finger joints should bend when doing this exercise. 3. Hold your squeeze for __________ seconds, then relax. Repeat this exercise 5-10 times with each hand. Contact a health care provider if:  Your hand pain or discomfort gets much worse when you do an exercise.  Your hand pain or discomfort does not improve within 2 hours after you exercise. If you have any of these problems, stop doing these exercises right away. Do not do them again unless your health care provider says that you can. Get help right away if:  You develop sudden, severe hand pain or swelling. If this happens, stop doing these exercises right away. Do not do them again unless your health care provider says that you can. This information is not intended to replace advice given to you by your health care provider. Make sure you discuss any questions you have with your health care provider. Document Revised: 01/18/2019 Document Reviewed: 09/28/2018 Elsevier Patient Education  2020 [employer]  

## 2020-10-11 ENCOUNTER — Other Ambulatory Visit: Payer: Self-pay | Admitting: Student in an Organized Health Care Education/Training Program

## 2020-10-11 ENCOUNTER — Other Ambulatory Visit: Payer: Self-pay | Admitting: Rheumatology

## 2020-10-11 DIAGNOSIS — E7849 Other hyperlipidemia: Secondary | ICD-10-CM

## 2020-10-13 NOTE — Telephone Encounter (Signed)
Last Visit: 09/12/2020 Next Visit: 03/16/2021 Labs: 07/17/2020 Glucose 127, total Protein 8.5  Current Dose per office note 09/12/2020: colchicine 0.6 mg 1 tablet by mouth daily as needed  DX:  Pseudogout   Okay to refill Colchicine?

## 2020-10-27 DIAGNOSIS — Z23 Encounter for immunization: Secondary | ICD-10-CM | POA: Diagnosis not present

## 2020-11-12 ENCOUNTER — Telehealth: Payer: Self-pay | Admitting: Dietician

## 2020-11-12 NOTE — Telephone Encounter (Signed)
Ms. Hornbaker called saying she needed to reschedule her telehealth appointment for today. Her appointment is next week. Left her a message and will also mail her an appointment reminder

## 2020-11-19 ENCOUNTER — Encounter: Payer: Self-pay | Admitting: Dietician

## 2020-11-19 ENCOUNTER — Ambulatory Visit (INDEPENDENT_AMBULATORY_CARE_PROVIDER_SITE_OTHER): Payer: Medicare Other | Admitting: Dietician

## 2020-11-19 ENCOUNTER — Other Ambulatory Visit: Payer: Self-pay

## 2020-11-19 DIAGNOSIS — E1169 Type 2 diabetes mellitus with other specified complication: Secondary | ICD-10-CM

## 2020-11-19 NOTE — Progress Notes (Signed)
Medical Nutrition Therapy : 0815 end time:  0836 Total time: 21 Visit # 3   This is a telephone encounter between Christine [name]  and Donna Plyler  on 11/19/2020 for Medical Nutrition Therapy. The visit was conducted with the patient located at home and Donna Plyler at [facility]. The patient's identity was confirmed using their DOB and current address. The patient has consented to being evaluated through a telephone encounter and understands the associated risks / benefits (allows the patient to remain at home, decreasing exposure to coronavirus). I personally spent 21 minutes on medical nutrition therapy discussion.    Patient and/or legal guardian consented to telephone visit: yes  Assessment:  Primary concerns today: blood sugar control, diabetes diagnosed in June 2021. She states that she feels good, got both of her COVID vaccines and has her booster scheduled. Her nausea is gone for the most part. Has constipation with no BM for 2 weeks and takes laxative for this. She continues her physical activity, eating healthy and smaller portions , checking blood sugar a few times a week and weighing herself.   She rates her confidence in caring for her diabetes at a 3.5-4/5. She said she forgets what numbers are too low and too high sometimes, but pulls out her books and reads until she finds that information.  Her a1c is due in April. She said she'd call back to schedule a follow up with Dr. Narendra. , achieving her goals of lowering her blood sugars with lifestyle change.Her weight has intentionally decreased by ~ 43 # since June 2021.   ANTHROPOMETRICS:reports weighing at home, weight this week is 250#; Sleep- she states "pretty good' and "no sleeping pills"  BLOOD SUGAR: THIS AM it was 100, 2-3 times a week.  Lab Results  Component Value Date   HGBA1C 6.5 (A) 07/17/2020   HGBA1C 7.1 (A) 03/04/2020   HGBA1C 6.9 (A) 12/11/2019   HGBA1C 6.7 (A) 08/14/2019   HGBA1C 6.6 (A) 10/17/2018    . DIETARY  INTAKE: Usual eating pattern includes 3 meals and 0-1 snacks per day. Everyday foods include fruit,  water, vegetables,  eggs, bread, Dining Out (times/week): none 24-hr recall:  water 730- 930 AM 2 eggs, cheese x1 slice not every day, water 1200- 1230 PM- chicken or fish baked or boiled, chicken/tuna salad or toast, grapes and carrots,  whole wheat bread 4-5 PM- leftovers string beans, carrots, cooks extra, cornbread and buttermilk Snacks- crushed pineapple, apples, pears, grapes,bananas Beverages: water, coffee- slowed down, milk- with cereal  Usual physical activity: sit and be fit -everyday for 30 minutes     Walks 5 days- 30-35 minutes   Progress Towards Goal(s):  Some progress.   Nutritional Diagnosis:  NB-1.1 Food and nutrition-related knowledge deficit As related to lack of sufficient prior diabetes and meal planning education shows improvement  As evidenced by her report, A1C and weight decrease.     Intervention:  Nutrition education and support about taking care of her diabetes, .  Action Goals: checking blood glucose, weighing, add prunes or bran cereal daily for constipation prevention             Exercise daily             Eating smaller portions- doing well as shown by continued gradual weight loss  Outcome goal: lower blood sugars has been met- now in maintenance stage Coordination of care: appointments with Dr. Narendra and [facility] Teaching Method Utilized: Visual, Auditory,Hands on Handouts given during visit include:   After visit summary Barriers to learning/adherence to lifestyle change: anxiety  Demonstrated degree of understanding via:  Teach Back   Monitoring/Evaluation:  Dietary intake, exercise, meter, and body weight in 3 month(s). Donna Plyler, RD 11/19/2020 8:47 AM.       

## 2020-11-19 NOTE — Patient Instructions (Signed)
[name],   Congrats on taking good care of your diabetes and getting your COVID vaccines!!!  Today We talked about:  1-  Rescheduling your appointment with Dr. Wyatt Portela [phone]) 2- Scheduling and appointment with Dr. Dareen Piano 3- your next A1C will be due April 2022- an A1C tells you what your blood sugar has been for the past 3 months 4- For your constipation- you can try adding a few prunes or bran cereal daily to see if this helps prevent your constipation  Talk to you in 3 months! Keep up the great work of caring for you and your diabetes!  [name] [phone]

## 2020-11-26 ENCOUNTER — Other Ambulatory Visit: Payer: Self-pay | Admitting: Dietician

## 2020-11-26 DIAGNOSIS — E1169 Type 2 diabetes mellitus with other specified complication: Secondary | ICD-10-CM

## 2020-11-26 NOTE — Progress Notes (Signed)
Referral request

## 2020-12-14 ENCOUNTER — Other Ambulatory Visit: Payer: Self-pay | Admitting: Internal Medicine

## 2020-12-14 DIAGNOSIS — I10 Essential (primary) hypertension: Secondary | ICD-10-CM

## 2020-12-19 ENCOUNTER — Other Ambulatory Visit: Payer: Self-pay | Admitting: Internal Medicine

## 2020-12-19 DIAGNOSIS — I10 Essential (primary) hypertension: Secondary | ICD-10-CM

## 2020-12-19 NOTE — Telephone Encounter (Signed)
amLODipine (NORVASC) 5 MG tablet,  spironolactone (ALDACTONE) 25 MG tablet, refill request @  [id number] - [place], [place] [address] Phone:  [phone]  Fax:  [phone]

## 2020-12-29 ENCOUNTER — Telehealth: Payer: Self-pay

## 2020-12-29 NOTE — Telephone Encounter (Signed)
Pls contact [phone] regarding blood pressure

## 2020-12-29 NOTE — Telephone Encounter (Signed)
Return pt's call - no answer; left message on self -identified vm to call the office.

## 2020-12-31 ENCOUNTER — Telehealth: Payer: Self-pay

## 2020-12-31 NOTE — Telephone Encounter (Signed)
Need refill on blood pressure medicine ;pt contact [phone]   [id number] - [place], [place] [address]

## 2020-12-31 NOTE — Telephone Encounter (Signed)
Amlodipine and Spironolactone were both sent to pharmacy in 12/2020 #90 with 1 refill.  TC to patient, message left with above information on her VM. SChaplin, RN,BSN

## 2021-01-20 ENCOUNTER — Encounter: Payer: Medicare Other | Admitting: Internal Medicine

## 2021-01-27 ENCOUNTER — Other Ambulatory Visit: Payer: Self-pay

## 2021-01-27 ENCOUNTER — Ambulatory Visit (INDEPENDENT_AMBULATORY_CARE_PROVIDER_SITE_OTHER): Payer: Medicare Other | Admitting: Internal Medicine

## 2021-01-27 ENCOUNTER — Encounter: Payer: Self-pay | Admitting: Internal Medicine

## 2021-01-27 VITALS — BP 141/62 | HR 80 | Temp 97.4°F | Ht 68.2 in | Wt 237.0 lb

## 2021-01-27 DIAGNOSIS — I10 Essential (primary) hypertension: Secondary | ICD-10-CM

## 2021-01-27 DIAGNOSIS — R109 Unspecified abdominal pain: Secondary | ICD-10-CM | POA: Diagnosis not present

## 2021-01-27 DIAGNOSIS — E1169 Type 2 diabetes mellitus with other specified complication: Secondary | ICD-10-CM | POA: Diagnosis not present

## 2021-01-27 DIAGNOSIS — M1120 Other chondrocalcinosis, unspecified site: Secondary | ICD-10-CM

## 2021-01-27 DIAGNOSIS — R634 Abnormal weight loss: Secondary | ICD-10-CM

## 2021-01-27 DIAGNOSIS — M1711 Unilateral primary osteoarthritis, right knee: Secondary | ICD-10-CM

## 2021-01-27 DIAGNOSIS — K219 Gastro-esophageal reflux disease without esophagitis: Secondary | ICD-10-CM

## 2021-01-27 DIAGNOSIS — F32A Depression, unspecified: Secondary | ICD-10-CM

## 2021-01-27 DIAGNOSIS — R110 Nausea: Secondary | ICD-10-CM | POA: Diagnosis not present

## 2021-01-27 HISTORY — DX: Abnormal weight loss: R63.4

## 2021-01-27 LAB — GLUCOSE, CAPILLARY: Glucose-Capillary: 90 mg/dL (ref 70–99)

## 2021-01-27 LAB — POCT GLYCOSYLATED HEMOGLOBIN (HGB A1C): Hemoglobin A1C: 5.6 % (ref 4.0–5.6)

## 2021-01-27 NOTE — Assessment & Plan Note (Signed)
-   This problem is chronic and stable -Patient recently followed up with her rheumatology office -She was encouraged to use her colchicine daily for her pseudogout but patient states that she uses this only intermittently when she has worsening pain -Patient was given a prescription for a Rollator walker with a seat by rheumatology. -No further work-up at this time.

## 2021-01-27 NOTE — Assessment & Plan Note (Signed)
BP Readings from Last 3 Encounters:  01/27/21 (!) 141/62  09/12/20 (!) 149/78  09/02/20 (!) 161/72    Lab Results  Component Value Date   NA 138 07/17/2020   K 4.3 07/17/2020   CREATININE 0.85 07/17/2020    Assessment: Blood pressure control:  Fair Progress toward BP goal:   Unchanged Comments: Patient is compliant with amlodipine 5 mg daily as well as Aldactone 25 mg daily.  Plan: Medications:  continue current medications Educational resources provided:   Self management tools provided:   Other plans: We will increase amlodipine to 10 mg daily at next visit if her blood pressure remains elevated

## 2021-01-27 NOTE — Assessment & Plan Note (Signed)
-   Patient has had intermittent nausea with associated abdominal discomfort since September -Patient had extensive work-up including H. pylori testing and right upper quadrant sono with no clear etiology for her symptoms -Patient states that her nausea improves after a bowel movement but denies constipation and states that she has a bowel movement every day.  Patient also complains of associated abdominal discomfort which is sometimes relieved with omeprazole. -Given the patient's extensive weight loss (60 pounds over the last year and 30 pounds since her last visit in November) and intermittent nausea/abdominal discomfort I am concerned for an underlying malignancy. -We will obtain a CT chest/abdomen/pelvis for further evaluation.  We will also consider referral to GI if this is negative for possible EGD/colonoscopy -We will have patient follow-up with me in 4 weeks.  No further work-up at this time

## 2021-01-27 NOTE — Assessment & Plan Note (Signed)
-   This problem is chronic and worsening -Patient states that she has worsening pain in her right knee despite the weight loss. -Patient was scheduled to have bilateral knee replacement with orthopedics but was advised to lose weight prior to the procedure -I encouraged the patient to call her orthopedic physician for follow-up given her extensive weight loss -We will continue Voltaren gel for pain control -No further work-up at this time

## 2021-01-27 NOTE — Assessment & Plan Note (Signed)
>>  ASSESSMENT AND PLAN FOR OSTEOARTHRITIS OF RIGHT KNEE WRITTEN ON 01/27/2021  4:04 PM BY NARENDRA, NISCHAL, MD  - This problem is chronic and worsening -Patient states that she has worsening pain in her right knee despite the weight loss. -Patient was scheduled to have bilateral knee replacement with orthopedics but was advised to lose weight prior to the procedure -I encouraged the patient to call her orthopedic physician for follow-up given her extensive weight loss -We will continue Voltaren gel for pain control -No further work-up at this time

## 2021-01-27 NOTE — Patient Instructions (Addendum)
-   It was a pleasure seeing you today -You lost another 30 pounds since your last visit.  I am concerned that this weight loss is not just diet related given the amount of weight that you have been losing.  We will check a CT of your chest/abdomen/pelvis to rule out an underlying cause for your weight loss -If your CT is within normal limits we will consider referring to GI for an endoscopy and colonoscopy given your nausea and abdominal pain -Please call the orthopedic surgeon to make an appointment for your knee replacement -Please follow-up with your eye doctor for your eye exam -Please call if you have any questions or concerns or need medication refills

## 2021-01-27 NOTE — Assessment & Plan Note (Signed)
-   This problem is chronic and stable -Patient's A1c today is down to 5.6 from 6.5 -Patient with glucometer readings in the 100s with an average of 105 -Patient states that she has been following a diet and exercising and is down approximately 30 pounds since I saw her in November (approximately 60 pounds over the last year) -We will hold off on medication at this time -Encouraged patient to make an appointment to see her eye doctor

## 2021-01-27 NOTE — Assessment & Plan Note (Signed)
-   Patient states that she does not take omeprazole daily and occasionally has burning epigastric pain with associated nausea -She states that she takes omeprazole as needed and has relief of her pain.  Patient states that her associated nausea usually improves after a bowel movement -I encouraged the patient to continue to use omeprazole as needed -No further work-up at this time

## 2021-01-27 NOTE — Assessment & Plan Note (Signed)
-   Patient has lost approximately 60 pounds over the last year -She states that she has been watching her diet and trying to avoid foods that she knows are "bad" for her. -However, the extent of her weight loss as well as the associated nausea and nonspecific abdominal pain is concerning for an underlying malignancy -We will obtain a CT chest/abdomen/pelvis for further evaluation and consider referral to GI for an EGD/colonoscopy if her CT is unrevealing

## 2021-01-27 NOTE — Progress Notes (Signed)
   Subjective:    Patient ID: Christine [name], female    DOB: 1935-12-02, 85 y.o.   MRN: [id number]  HPI  I have seen and examined this patient.  Patient is here for routine follow-up of her hypertension and diabetes.  Patient states that she has been losing weight and complains of intermittent nausea/abdominal discomfort.  States this is improved after a bowel movement but this has been persistent over the last 6 to 8 months and she states that she has a bowel movement daily.  She still complains of persistent right knee pain.  She denies any other complaints at this time.  She states that she is compliant with all her medications  Review of Systems  Constitutional: Negative.   HENT: Negative.   Respiratory: Negative.   Cardiovascular: Negative.   Gastrointestinal: Positive for abdominal pain and nausea.  Musculoskeletal: Negative.   Neurological: Negative.   Psychiatric/Behavioral: Negative.        Objective:   Physical Exam Constitutional:      Appearance: Normal appearance.  HENT:     Head: Normocephalic and atraumatic.  Cardiovascular:     Rate and Rhythm: Normal rate and regular rhythm.     Heart sounds: Normal heart sounds.  Pulmonary:     Effort: No respiratory distress.     Breath sounds: Normal breath sounds. No wheezing.  Abdominal:     General: Bowel sounds are normal. There is no distension.     Palpations: Abdomen is soft.     Tenderness: There is no abdominal tenderness.  Musculoskeletal:        General: No swelling or tenderness.     Cervical back: Neck supple.  Lymphadenopathy:     Cervical: No cervical adenopathy.  Neurological:     General: No focal deficit present.     Mental Status: She is alert and oriented to person, place, and time.  Psychiatric:        Mood and Affect: Mood normal.        Behavior: Behavior normal.           Assessment & Plan:  Please see problem based charting for assessment and plan:

## 2021-01-27 NOTE — Assessment & Plan Note (Addendum)
-   This problem is chronic worsening -Patient states that she has intermittent episodes of depression since her son passed away and that she sometimes feels alone -We will put in a referral to Dr. Carolynne Edouard for follow-up.  Patient is in agreement with plan -Patient PHQ-9 score was up to 11 today from 2 on her last visit -We will continue to monitor closely.  If she is not improving with counseling alone would consider starting the patient on an SSRI -No further work-up at this time

## 2021-01-30 ENCOUNTER — Other Ambulatory Visit: Payer: Self-pay | Admitting: Internal Medicine

## 2021-01-30 DIAGNOSIS — I10 Essential (primary) hypertension: Secondary | ICD-10-CM

## 2021-02-06 ENCOUNTER — Ambulatory Visit (HOSPITAL_COMMUNITY): Payer: Medicare Other

## 2021-02-09 ENCOUNTER — Other Ambulatory Visit (INDEPENDENT_AMBULATORY_CARE_PROVIDER_SITE_OTHER): Payer: Medicare Other

## 2021-02-09 ENCOUNTER — Telehealth: Payer: Self-pay | Admitting: *Deleted

## 2021-02-09 DIAGNOSIS — I10 Essential (primary) hypertension: Secondary | ICD-10-CM

## 2021-02-09 NOTE — Telephone Encounter (Signed)
Pt's here for labs - has questions about CT scan at [facility]. Informed she will need oral contrast; instructed she can pick up contract here at [facility] since she's here. I also talked to [facility] radiology who stated they need sign orders to be faxed to [phone]. Dr Dareen Piano signed the orders which were faxed.

## 2021-02-10 LAB — BMP8+ANION GAP
Anion Gap: 14 mmol/L (ref 10.0–18.0)
BUN/Creatinine Ratio: 21 (ref 12–28)
BUN: 18 mg/dL (ref 8–27)
CO2: 26 mmol/L (ref 20–29)
Calcium: 9.8 mg/dL (ref 8.7–10.3)
Chloride: 98 mmol/L (ref 96–106)
Creatinine, Ser: 0.84 mg/dL (ref 0.57–1.00)
Glucose: 103 mg/dL — ABNORMAL HIGH (ref 65–99)
Potassium: 4.8 mmol/L (ref 3.5–5.2)
Sodium: 138 mmol/L (ref 134–144)
eGFR: 69 mL/min/{1.73_m2} (ref >59)

## 2021-02-11 ENCOUNTER — Ambulatory Visit (HOSPITAL_COMMUNITY)
Admission: RE | Admit: 2021-02-11 | Discharge: 2021-02-11 | Disposition: A | Discharge status: Home / Self Care | Payer: Medicare Other | Source: Ambulatory Visit | Attending: Internal Medicine | Admitting: Internal Medicine

## 2021-02-11 ENCOUNTER — Other Ambulatory Visit: Payer: Self-pay

## 2021-02-11 DIAGNOSIS — K5750 Diverticulosis of both small and large intestine without perforation or abscess without bleeding: Secondary | ICD-10-CM | POA: Diagnosis not present

## 2021-02-11 DIAGNOSIS — I700 Atherosclerosis of aorta: Secondary | ICD-10-CM | POA: Diagnosis not present

## 2021-02-11 DIAGNOSIS — S2241XA Multiple fractures of ribs, right side, initial encounter for closed fracture: Secondary | ICD-10-CM | POA: Diagnosis not present

## 2021-02-11 DIAGNOSIS — R109 Unspecified abdominal pain: Secondary | ICD-10-CM | POA: Diagnosis not present

## 2021-02-11 DIAGNOSIS — M47816 Spondylosis without myelopathy or radiculopathy, lumbar region: Secondary | ICD-10-CM | POA: Diagnosis not present

## 2021-02-11 DIAGNOSIS — R634 Abnormal weight loss: Secondary | ICD-10-CM | POA: Insufficient documentation

## 2021-02-11 DIAGNOSIS — J8410 Pulmonary fibrosis, unspecified: Secondary | ICD-10-CM | POA: Diagnosis not present

## 2021-02-11 MED ORDER — IOHEXOL 300 MG/ML  SOLN
100.0000 mL | Freq: Once | INTRAMUSCULAR | Status: AC | PRN
  Administered 2021-02-11: 100 mL via INTRAVENOUS

## 2021-02-11 MED ORDER — SODIUM CHLORIDE (PF) 0.9 % IJ SOLN
INTRAMUSCULAR | Status: AC
  Filled 2021-02-11: qty 50

## 2021-02-13 ENCOUNTER — Telehealth: Payer: Self-pay | Admitting: Internal Medicine

## 2021-02-13 ENCOUNTER — Encounter: Payer: Self-pay | Admitting: *Deleted

## 2021-02-13 DIAGNOSIS — R634 Abnormal weight loss: Secondary | ICD-10-CM

## 2021-02-13 NOTE — Progress Notes (Unsigned)

## 2021-02-13 NOTE — Telephone Encounter (Signed)
I called the patient to discuss the results of her CT scans with her.  The CT scans showed no evidence of malignancy or metastasis or any other acute abnormality.  She does have a history of multinodular goiter which was noted on CT.  Patient does still have some episodes of nausea and will likely require referral to GI for further evaluation.  No further work-up at this time.  Patient expressed understanding and is in agreement with plan.

## 2021-02-16 ENCOUNTER — Telehealth: Payer: Self-pay | Admitting: Dietician

## 2021-02-16 NOTE — Telephone Encounter (Signed)
Confirming her appointment and which phone to call on tomorrow

## 2021-02-17 ENCOUNTER — Ambulatory Visit (INDEPENDENT_AMBULATORY_CARE_PROVIDER_SITE_OTHER): Payer: Medicare Other | Admitting: Dietician

## 2021-02-17 ENCOUNTER — Ambulatory Visit: Payer: Medicare Other | Admitting: Dietician

## 2021-02-17 ENCOUNTER — Other Ambulatory Visit: Payer: Self-pay

## 2021-02-17 DIAGNOSIS — E1169 Type 2 diabetes mellitus with other specified complication: Secondary | ICD-10-CM

## 2021-02-17 NOTE — Patient Instructions (Addendum)
  [name],   Congrats on taking good care of your diabetes and getting your COVID vaccines!!!  Today We talked about:  1-  Rescheduling your appointment with Dr. Wyatt Portela [phone]) 2- Scheduling an appointment with Dr. Dareen Piano for July/August 2022. 3- your next A1C will be due July 2022- an A1C tells you what your blood sugar has been for the past 3 months  Talk to you in 3 months(~ August)! Keep up the great work of caring for you and your diabetes    [name] [phone]

## 2021-02-17 NOTE — Progress Notes (Signed)
Medical Nutrition Therapy : 0818 end time:  0830 Total time:12 Visit # 4   This is a telephone encounter between Christine [name] and Christine [name]  on 5/10/2022for Medical Nutrition Therapy. The visit was conducted with the patient located athomeand Christine Penny [facility]lerat [facility]. The patient's identity was confirmed using their DOB and current address. Thepatienthas consented to being evaluated through a telephone encounter and understands the associated risks / benefits (allows the patient to remain at home, decreasing exposure to coronavirus). I personally spent 64minutes on medical nutrition therapy discussion.   Patient and/or legal guardian consented to telephone visit:yes  Assessment:  Primary concerns today: blood sugar control, diabetes diagnosed in June 2021. She states that she feels good, has her COVID booster scheduled. She continues her physical activity, eating healthy and smaller portions , checking blood sugar a few times a week and weighing herself.   The pills Dr. Dareen Piano  gave her have helped with her nausea.  She rates her confidence in caring for her diabetes at a 3.0-3.5/5.  Her a1c is due in April was in the normal range, achieving her goals of lowering her blood sugars with lifestyle change. She decreased her weight intentionally by  55 # since June 2021. 5.5# pr months which is a reasonable rate of weight loss. Her BMI is now 35.  ANTHROPOMETRICS:Estimated body mass index is 35.82 kg/m as calculated from the following:   Height as of 01/27/21: 5' 8.2" (1.732 m).   Weight as of 01/27/21: 237 lb (107.5 kg).  BLOOD SUGAR: 02/06/21 it was 101,02/10/21 137, 02/11/21- 111, 02/12/21- 98,  2-3 times a week.  Lab Results  Component Value Date   HGBA1C 5.6 01/27/2021   HGBA1C 6.5 (A) 07/17/2020   HGBA1C 7.1 (A) 03/04/2020   HGBA1C 6.9 (A) 12/11/2019   HGBA1C 6.7 (A) 08/14/2019    Wt Readings from Last 10 Encounters:  01/27/21 237 lb (107.5 kg)  09/12/20 262 lb (118.8 kg)   09/02/20 262 lb 8 oz (119.1 kg)  08/19/20 266 lb 6.4 oz (120.8 kg)  08/12/20 270 lb 3.2 oz (122.6 kg)  07/17/20 274 lb 3.2 oz (124.4 kg)  06/24/20 276 lb (125.2 kg)  04/29/20 288 lb 12.8 oz (131 kg)  04/08/20 290 lb 1.6 oz (131.6 kg)  03/25/20 292 lb 1.6 oz (132.5 kg)    . DIETARY INTAKE: Usual eating pattern includes 3 meals and 0-1 snacks per day. Everyday foods include fruit,  water, vegetables,  eggs, bread, Dining Out (times/week): none. Eats mostly vegetables, fruits, lean meat, whole grains. Every now and them "something  she shouldn't" be eating, but gets back on track right away. Her family is supportive and brings her fruits and healthy foods Usual physical activity: sit and be fit -4 for 30 minutes                Walks 5-6 days- 30-35 minutes   Progress Towards Goal(s):  Some progress.              Nutritional Diagnosis:  NB-1.1 Food and nutrition-related knowledge deficit As related to lack of sufficient prior diabetes and meal planning education shows continued improvement As evidenced by her report, A1C and weight decrease.                Intervention:  Nutrition education and support about taking care of her diabetes, .  Action Goals: checking blood glucose, weighing, call Dr. Zenia Resides office to schedule an eye exam.  Exercise daily             Eating smaller portions- doing well as shown by continued gradual weight loss  Outcome goal: lower blood sugars has been met- now in maintenance stage;  would like her self care confidence to improve to at least a 4-5/5  Coordination of care:none Teaching Method Utilized: Visual, Auditory,Hands on Handouts given during visit include: After visit summary Barriers to learning/adherence to lifestyle change: anxiety  Demonstrated degree of understanding via:  Teach Back   Monitoring/Evaluation:  Dietary intake, exercise, meter, and body weight in 3 month(s). [name], RD 02/17/2021 8:41  AM.

## 2021-02-25 ENCOUNTER — Encounter: Payer: Medicare Other | Admitting: Internal Medicine

## 2021-02-25 NOTE — Progress Notes (Unsigned)
Things That May Be Affecting Your Health:  Alcohol  Hearing loss x Pain    Depression  Home Safety  Sexual Health  x Diabetes  Lack of physical activity  Stress  x Difficulty with daily activities  Loneliness  Tiredness   Drug use  Medicines  Tobacco use   Falls  Motor Vehicle Safety  Weight   Food choices  Oral Health  Other    YOUR PERSONALIZED HEALTH PLAN : 1. Schedule your next subsequent Medicare Wellness visit in one year 2. Attend all of your regular appointments to address your medical issues 3. Complete the preventative screenings and services   Annual Wellness Visit   Medicare Covered Preventative Screenings and [place] Men and Women Who How Often Need? Date of Last Service Action  Abdominal Aortic Aneurysm Adults with AAA risk factors Once      Alcohol Misuse and Counseling All Adults Screening once a year if no alcohol misuse. Counseling up to 4 face to face sessions.     Bone Density Measurement  Adults at risk for osteoporosis Once every 2 yrs      Lipid Panel Z13.6 All adults without CV disease Once every 5 yrs       Colorectal Cancer   Stool sample or  Colonoscopy All adults 45 and older   Once every year  Every 10 years        Depression All Adults Once a year  Today   Diabetes Screening Blood glucose, post glucose load, or GTT Z13.1  All adults at risk  Pre-diabetics  Once per year  Twice per year      Diabetes  Self-Management Training All adults Diabetics 10 hrs first year; 2 hours subsequent years. Requires Copay     Glaucoma  Diabetics  Family history of glaucoma  African Americans 12 yrs +  Hispanic Americans 68 yrs + Annually - requires coppay      Hepatitis C Z72.89 or F19.20  High Risk for HCV  Born between 1945 and 1965  Annually  Once      HIV Z11.4 All adults based on risk  Annually btw ages 69 & 26 regardless of risk  Annually > 65 yrs if at increased risk      Lung Cancer Screening  Asymptomatic adults aged 37-77 with 30 pack yr history and current smoker OR quit within the last 15 yrs Annually Must have counseling and shared decision making documentation before first screen      Medical Nutrition Therapy Adults with   Diabetes  Renal disease  Kidney transplant within past 3 yrs 3 hours first year; 2 hours subsequent years     Obesity and Counseling All adults Screening once a year Counseling if BMI 30 or higher  Today   Tobacco Use Counseling Adults who use tobacco  Up to 8 visits in one year     Vaccines Z23  Hepatitis B  Influenza   Pneumonia  Adults   Once  Once every flu season  Two different vaccines separated by one year     Next Annual Wellness Visit People with Medicare Every year  Today     Services & Screenings Women Who How Often Need  Date of Last Service Action  Mammogram  Z12.31 Women over 46 One baseline ages 27-39. Annually ager 40 yrs+      Pap tests All women Annually if high risk. Every 2 yrs for normal risk women  Screening for cervical cancer with   Pap (Z01.419 nl or Z01.411abnl) &  HPV Z11.51 Women aged 40 to 69 Once every 5 yrs     Screening pelvic and breast exams All women Annually if high risk. Every 2 yrs for normal risk women     Sexually Transmitted Diseases  Chlamydia  Gonorrhea  Syphilis All at risk adults Annually for non pregnant females at increased risk         [place] Men Who How Ofter Need  Date of Last Service Action  Prostate Cancer - DRE & PSA Men over 50 Annually.  DRE might require a copay.        Sexually Transmitted Diseases  Syphilis All at risk adults Annually for men at increased risk      Health Maintenance List Health Maintenance  Topic Date Due  . OPHTHALMOLOGY EXAM  06/10/2020  . COVID-19 Vaccine (3 - Booster for Pfizer series) 04/16/2021  . FOOT EXAM  07/17/2021  . HEMOGLOBIN A1C  07/29/2021  . URINE MICROALBUMIN  09/02/2021  . TETANUS/TDAP   05/11/2026  . DEXA SCAN  Completed  . PNA vac Low Risk Adult  Completed  . HPV VACCINES  Aged Out
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
## 2021-03-03 ENCOUNTER — Encounter: Payer: Self-pay | Admitting: Internal Medicine

## 2021-03-03 ENCOUNTER — Ambulatory Visit (INDEPENDENT_AMBULATORY_CARE_PROVIDER_SITE_OTHER): Payer: Medicare Other | Admitting: Internal Medicine

## 2021-03-03 VITALS — BP 136/59 | HR 86 | Temp 97.8°F | Ht 68.2 in | Wt 236.3 lb

## 2021-03-03 DIAGNOSIS — J309 Allergic rhinitis, unspecified: Secondary | ICD-10-CM

## 2021-03-03 DIAGNOSIS — E119 Type 2 diabetes mellitus without complications: Secondary | ICD-10-CM | POA: Diagnosis not present

## 2021-03-03 DIAGNOSIS — K219 Gastro-esophageal reflux disease without esophagitis: Secondary | ICD-10-CM | POA: Diagnosis not present

## 2021-03-03 DIAGNOSIS — R634 Abnormal weight loss: Secondary | ICD-10-CM

## 2021-03-03 MED ORDER — ONETOUCH DELICA PLUS LANCET33G MISC: 3 refills | Status: DC

## 2021-03-03 MED ORDER — LORATADINE 10 MG PO TABS: ORAL_TABLET | ORAL | 1 refills | Status: DC

## 2021-03-03 MED ORDER — ONETOUCH VERIO VI STRP: ORAL_STRIP | 3 refills | Status: DC

## 2021-03-03 MED ORDER — OMEPRAZOLE 40 MG PO CPDR: DELAYED_RELEASE_CAPSULE | ORAL | 1 refills | Status: DC

## 2021-03-03 NOTE — Progress Notes (Signed)
   CC: GERD, weight loss  HPI:[name] is a 85 y.o. female who presents for evaluation of GERD and unintended weight loss. Please see individual problem based A/P for details.   Past Medical History:  Diagnosis Date  . Arthritis   . Cataract   . Depression   . GERD (gastroesophageal reflux disease)    per patient   . Grief reaction 10/17/2018  . Hip pain, acute, right 01/30/2018  . Hyperlipemia   . Hypertension   . Impaired glucose tolerance   . Morbidly obese ([place])   . Pruritus   . Seasonal allergies   . Thyroid nodule    Review of Systems:   Review of Systems  Constitutional: Positive for weight loss. Negative for chills and fever.  Gastrointestinal: Negative for abdominal pain, blood in stool, heartburn, melena and nausea.     Physical Exam: Vitals:   03/03/21 0845  BP: (!) 136/59  Pulse: 86  Temp: 97.8 F (36.6 C)  TempSrc: Oral  SpO2: 100%  Weight: 236 lb 4.8 oz (107.2 kg)  Height: 5' 8.2" (1.732 m)   General: Elderly women , NAD, nl appearance HEENT: Conjunctiva nl , antiicteric sclerae, moist mucous membranes, no exudate or erythema Cardiovascular: Normal rate, regular rhythm.  No murmurs, rubs, or gallops Pulmonary : Equal breath sounds, No wheezes, rales, or rhonchi Abdominal: soft, nontender,  bowel sounds present Ext: No edema in lower extremities, no tenderness to palpation of lower extremities.   Assessment & Plan:   See Encounters Tab for problem based charting.  Patient discussed with Dr. Philipp Ovens

## 2021-03-03 NOTE — Assessment & Plan Note (Signed)
Refilled - loratadine (CLARITIN) 10 MG tablet; take 1 tablet by mouth once a day if needed for allergies  Dispense: 90 tablet; Refill: 1

## 2021-03-03 NOTE — Assessment & Plan Note (Signed)
Weight /BMI 03/03/2021 01/27/2021 09/12/2020 09/02/2020 08/19/2020  WEIGHT 236 lb 4.8 oz 237 lb 262 lb 262 lb 8 oz 266 lb 6.4 oz   Weight /BMI 08/12/2020 07/17/2020 06/24/2020 04/29/2020  WEIGHT 270 lb 3.2 oz 274 lb 3.2 oz 276 lb 288 lb 12.8 oz   Weight /BMI 04/08/2020 03/25/2020 03/14/2020 03/04/2020 12/11/2019  WEIGHT 290 lb 1.6 oz 292 lb 1.6 oz 293 lb 295 lb 293 lb 6.4 oz   Weight /BMI 08/14/2019 05/28/2019 05/15/2019 11/17/2018  WEIGHT 296 lb 11.2 oz 292 lb 293 lb 1.6 oz 284 lb 4.8 oz   Weight /BMI 10/30/2018 10/17/2018 10/13/2018 09/15/2018  WEIGHT 288 lb 288 lb 9.6 oz 287 lb 8 oz 285 lb 6.4 oz   Weight /BMI 09/01/2018 08/04/2018 07/14/2018 07/07/2018  WEIGHT 285 lb 3.2 oz 287 lb 9.6 oz 286 lb 11.2 oz 288 lb 8 oz   Weight /BMI 06/09/2018 05/26/2018 05/19/2018 05/12/2018  WEIGHT 289 lb 12.8 oz 289 lb 12.8 oz 291 lb 14.4 oz 294 lb 1.6 oz   Weight /BMI 05/05/2018 04/28/2018 04/21/2018 04/20/2018  WEIGHT 292 lb 1.6 oz 295 lb 295 lb 6.4 oz 296 lb 8 oz   Weight /BMI 04/11/2018 04/07/2018 03/31/2018 03/09/2018  WEIGHT 295 lb 9.6 oz 294 lb 9.6 oz 297 lb 8 oz 302 lb 6.4 oz   Weight /BMI 02/21/2018 01/30/2018 01/30/2018 12/20/2017  WEIGHT 307 lb 8 oz  308 lb 311 lb 12.8 oz    Patient here for follow up of weight loss. Patient has had weight loss starting back in 2019. See weight trend above.  Patient is mostly asymptomatic since GERD has been controlled with omeprazole. She has changed what she eats since being told she was diabetic. She not longer eats cake and coffee. Reports a diet of vegetables and lean dairy products. Denies fever, night sweat, and melena.   Review of lab work 6 months ago showed nl albumin. Normal CBC. TSH nl in March of 2021. Last colonoscopy in 2009. Last mammogram 2014. Nl CT chest/abdomen/pelvis 2022.   Assessment/Plan: Unintended weight loss ( >5% over 12 months), Patient has changed her diet and at this time seems to be the etiology. Offered patient further evaluation with referral to GI. Patient defers  at this time. Follow up in 3 months for continued monitoring.

## 2021-03-03 NOTE — Patient Instructions (Addendum)
Thank you for trusting me with your care. To recap, today we discussed the following:   1. Type 2 diabetes mellitus without complication, without long-term current use of insulin (HCC)  - Lancets (ONETOUCH DELICA PLUS [id number]) MISC; Use to check blood sugar up to 1 time day  Dispense: 100 each; Refill: 3 - glucose blood (ONETOUCH VERIO) test strip; Use to check blood sugar up to 1 time day  Dispense: 100 each; Refill: 3  2. Allergic rhinitis, unspecified seasonality, unspecified trigger  - loratadine (CLARITIN) 10 MG tablet; take 1 tablet by mouth once a day if needed for allergies  Dispense: 90 tablet; Refill: 1  3. Gastroesophageal reflux disease without esophagitis  - omeprazole (PRILOSEC) 40 MG capsule; TAKE 1 CAPSULE BY MOUTH EVERY DAY  Dispense: 90 capsule; Refill: 1

## 2021-03-03 NOTE — Assessment & Plan Note (Signed)
Refilled - Lancets (ONETOUCH DELICA PLUS [id number]) [place]; Use to check blood sugar up to 1 time day  Dispense: 100 each; Refill: 3 - glucose blood (ONETOUCH VERIO) test strip; Use to check blood sugar up to 1 time day  Dispense: 100 each; Refill: 3

## 2021-03-03 NOTE — Assessment & Plan Note (Signed)
Patient here for follow up of GERD. Has experienced approximately 3 episodes of nausea in the last 3 months, associated with drinking coffee.   Assessment: Gastroesophageal reflux disease without esophagitis, Presenting symptoms of nausea and abdominal discomfort mostly resolved with treatment with PPI. If patient is on PPI long term future providers can consider yearly monitoring of B12 and Mg Plan: - omeprazole (PRILOSEC) 40 MG capsule; TAKE 1 CAPSULE BY MOUTH EVERY DAY  Dispense: 90 capsule; Refill: 1

## 2021-03-04 NOTE — Progress Notes (Addendum)
Internal Medicine Clinic Attending  Case discussed with Dr. Steen  At the time of the visit.  We reviewed the resident's history and exam and pertinent patient test results.  I agree with the assessment, diagnosis, and plan of care documented in the resident's note.  

## 2021-03-16 ENCOUNTER — Encounter: Payer: Self-pay | Admitting: Rheumatology

## 2021-03-16 ENCOUNTER — Other Ambulatory Visit: Payer: Self-pay

## 2021-03-16 ENCOUNTER — Ambulatory Visit (INDEPENDENT_AMBULATORY_CARE_PROVIDER_SITE_OTHER): Payer: Medicare Other | Admitting: Rheumatology

## 2021-03-16 VITALS — BP 128/85 | HR 97 | Resp 16 | Ht 68.25 in | Wt 232.6 lb

## 2021-03-16 DIAGNOSIS — R7303 Prediabetes: Secondary | ICD-10-CM | POA: Diagnosis not present

## 2021-03-16 DIAGNOSIS — Z8679 Personal history of other diseases of the circulatory system: Secondary | ICD-10-CM | POA: Diagnosis not present

## 2021-03-16 DIAGNOSIS — M1711 Unilateral primary osteoarthritis, right knee: Secondary | ICD-10-CM | POA: Diagnosis not present

## 2021-03-16 DIAGNOSIS — M160 Bilateral primary osteoarthritis of hip: Secondary | ICD-10-CM | POA: Diagnosis not present

## 2021-03-16 DIAGNOSIS — M7061 Trochanteric bursitis, right hip: Secondary | ICD-10-CM

## 2021-03-16 DIAGNOSIS — Z6835 Body mass index (BMI) 35.0-35.9, adult: Secondary | ICD-10-CM

## 2021-03-16 DIAGNOSIS — M19042 Primary osteoarthritis, left hand: Secondary | ICD-10-CM

## 2021-03-16 DIAGNOSIS — L409 Psoriasis, unspecified: Secondary | ICD-10-CM

## 2021-03-16 DIAGNOSIS — Z8719 Personal history of other diseases of the digestive system: Secondary | ICD-10-CM | POA: Diagnosis not present

## 2021-03-16 DIAGNOSIS — Z8639 Personal history of other endocrine, nutritional and metabolic disease: Secondary | ICD-10-CM

## 2021-03-16 DIAGNOSIS — M1120 Other chondrocalcinosis, unspecified site: Secondary | ICD-10-CM | POA: Diagnosis not present

## 2021-03-16 DIAGNOSIS — M19041 Primary osteoarthritis, right hand: Secondary | ICD-10-CM | POA: Insufficient documentation

## 2021-03-16 MED ORDER — TRIAMCINOLONE ACETONIDE 40 MG/ML IJ SUSP
40.0000 mg | INTRAMUSCULAR | Status: AC | PRN
  Administered 2021-03-16: 40 mg via INTRA_ARTICULAR

## 2021-03-16 MED ORDER — LIDOCAINE HCL 1 % IJ SOLN
1.5000 mL | INTRAMUSCULAR | Status: AC | PRN
  Administered 2021-03-16: 1.5 mL

## 2021-03-17 DIAGNOSIS — Z23 Encounter for immunization: Secondary | ICD-10-CM | POA: Diagnosis not present

## 2021-03-18 ENCOUNTER — Encounter: Payer: Self-pay | Admitting: *Deleted

## 2021-03-30 ENCOUNTER — Encounter: Payer: Self-pay | Admitting: Physician Assistant

## 2021-04-28 ENCOUNTER — Ambulatory Visit (INDEPENDENT_AMBULATORY_CARE_PROVIDER_SITE_OTHER): Payer: Medicare Other | Admitting: Physician Assistant

## 2021-04-28 ENCOUNTER — Encounter: Payer: Self-pay | Admitting: Physician Assistant

## 2021-04-28 ENCOUNTER — Other Ambulatory Visit (INDEPENDENT_AMBULATORY_CARE_PROVIDER_SITE_OTHER): Payer: Medicare Other

## 2021-04-28 VITALS — BP 128/60 | HR 84 | Ht 68.25 in | Wt 227.4 lb

## 2021-04-28 DIAGNOSIS — R634 Abnormal weight loss: Secondary | ICD-10-CM

## 2021-04-28 DIAGNOSIS — R110 Nausea: Secondary | ICD-10-CM

## 2021-04-28 DIAGNOSIS — K5900 Constipation, unspecified: Secondary | ICD-10-CM

## 2021-04-28 DIAGNOSIS — R6881 Early satiety: Secondary | ICD-10-CM

## 2021-04-28 LAB — CBC WITH DIFFERENTIAL/PLATELET
Basophils Absolute: 0 10*3/uL (ref 0.0–0.1)
Basophils Relative: 0.3 % (ref 0.0–3.0)
Eosinophils Absolute: 0 10*3/uL (ref 0.0–0.7)
Eosinophils Relative: 0.5 % (ref 0.0–5.0)
HCT: 38.5 % (ref 36.0–46.0)
Hemoglobin: 12.9 g/dL (ref 12.0–15.0)
Lymphocytes Relative: 25.2 % (ref 12.0–46.0)
Lymphs Abs: 1.4 10*3/uL (ref 0.7–4.0)
MCHC: 33.5 g/dL (ref 30.0–36.0)
MCV: 86.2 fl (ref 78.0–100.0)
Monocytes Absolute: 0.3 10*3/uL (ref 0.1–1.0)
Monocytes Relative: 4.8 % (ref 3.0–12.0)
Neutro Abs: 3.9 10*3/uL (ref 1.4–7.7)
Neutrophils Relative %: 69.2 % (ref 43.0–77.0)
Platelets: 178 10*3/uL (ref 150.0–400.0)
RBC: 4.47 Mil/uL (ref 3.87–5.11)
RDW: 14.9 % (ref 11.5–15.5)
WBC: 5.6 10*3/uL (ref 4.0–10.5)

## 2021-04-28 LAB — TSH: TSH: 0.99 u[IU]/mL (ref 0.35–5.50)

## 2021-04-28 MED ORDER — ONDANSETRON HCL 4 MG PO TABS: 4.0000 mg | ORAL_TABLET | Freq: Two times a day (BID) | ORAL | 4 refills | Status: AC | PRN
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
## 2021-04-28 NOTE — Patient Instructions (Signed)
If you are age 83 or older, your body mass index should be between 23-30. Your Body mass index is 34.32 kg/m. If this is out of the aforementioned range listed, please consider follow up with your Primary Care Provider. __________________________________________________________  The [facility] providers would like to encourage you to use [facility] to communicate with providers for non-urgent requests or questions.  Due to long hold times on the telephone, sending your provider a message by [facility] may be a faster and more efficient way to get a response.  Please allow 48 business hours for a response.  Please remember that this is for non-urgent requests.   You have been scheduled for an endoscopy. Please follow written instructions given to you at your visit today. If you use inhalers (even only as needed), please bring them with you on the day of your procedure.  You have been scheduled for a gastric emptying scan at [facility] on 05/14/2021 at 7:30. Please arrive at least 30 minutes prior to your appointment for registration, check in through the ER. Please make certain not to have anything to eat or drink after midnight the night before your test. Hold all stomach medications (ex: Zofran, phenergan, Reglan) 48 hours prior to your test. If you need to reschedule your appointment, please contact radiology scheduling at [phone]. _____________________________________________________________________ A gastric-emptying study measures how long it takes for food to move through your stomach. There are several ways to measure stomach emptying. In the most common test, you eat food that contains a small amount of radioactive material. A scanner that detects the movement of the radioactive material is placed over your abdomen to monitor the rate at which food leaves your stomach. This test normally takes about 4 hours to  complete. _____________________________________________________________________  Your provider has requested that you go to the basement level for lab work before leaving today. Press "B" on the elevator. The lab is located at the first door on the left as you exit the elevator.  Continue your Omeprazole 40 mg 1 capsule every morning. We have sent in Ondansetron 4 mg 1 tablet twice daily as needed.  Use Miralax 1 capful in 8 ounces of water or juice daily.

## 2021-04-30 NOTE — Progress Notes (Signed)
Agree with above assessment and plan 

## 2021-05-07 ENCOUNTER — Ambulatory Visit (AMBULATORY_SURGERY_CENTER): Payer: Medicare Other | Admitting: Gastroenterology

## 2021-05-07 ENCOUNTER — Other Ambulatory Visit: Payer: Self-pay

## 2021-05-07 ENCOUNTER — Encounter: Payer: Self-pay | Admitting: Gastroenterology

## 2021-05-07 VITALS — BP 145/72 | HR 87 | Temp 97.3°F | Resp 10 | Ht 68.0 in | Wt 227.0 lb

## 2021-05-07 DIAGNOSIS — R634 Abnormal weight loss: Secondary | ICD-10-CM

## 2021-05-07 DIAGNOSIS — K222 Esophageal obstruction: Secondary | ICD-10-CM | POA: Diagnosis not present

## 2021-05-07 DIAGNOSIS — K319 Disease of stomach and duodenum, unspecified: Secondary | ICD-10-CM

## 2021-05-07 DIAGNOSIS — K3189 Other diseases of stomach and duodenum: Secondary | ICD-10-CM | POA: Diagnosis not present

## 2021-05-07 DIAGNOSIS — R110 Nausea: Secondary | ICD-10-CM | POA: Diagnosis not present

## 2021-05-07 DIAGNOSIS — R6881 Early satiety: Secondary | ICD-10-CM

## 2021-05-07 DIAGNOSIS — K5900 Constipation, unspecified: Secondary | ICD-10-CM | POA: Diagnosis not present

## 2021-05-07 MED ORDER — SODIUM CHLORIDE 0.9 % IV SOLN: 500.0000 mL | Freq: Once | INTRAVENOUS | Status: DC

## 2021-05-07 NOTE — Patient Instructions (Signed)
YOU HAD AN ENDOSCOPIC PROCEDURE TODAY AT THE [facility]:   Refer to the procedure report that was given to you for any specific questions about what was found during the examination.  If the procedure report does not answer your questions, please call your gastroenterologist to clarify.  If you requested that your care partner not be given the details of your procedure findings, then the procedure report has been included in a sealed envelope for you to review at your convenience later.  YOU SHOULD EXPECT: Some feelings of bloating in the abdomen. Passage of more gas than usual.  Walking can help get rid of the air that was put into your GI tract during the procedure and reduce the bloating. If you had a lower endoscopy (such as a colonoscopy or flexible sigmoidoscopy) you may notice spotting of blood in your stool or on the toilet paper. If you underwent a bowel prep for your procedure, you may not have a normal bowel movement for a few days.  Please Note:  You might notice some irritation and congestion in your nose or some drainage.  This is from the oxygen used during your procedure.  There is no need for concern and it should clear up in a day or so.  SYMPTOMS TO REPORT IMMEDIATELY:    Following upper endoscopy (EGD)  Vomiting of blood or coffee ground material  New chest pain or pain under the shoulder blades  Painful or persistently difficult swallowing  New shortness of breath  Fever of 100F or higher  Black, tarry-looking stools  For urgent or emergent issues, a gastroenterologist can be reached at any hour by calling [phone]. Do not use MyChart messaging for urgent concerns.    DIET:  We do recommend a small meal at first, but then you may proceed to your regular diet.  Drink plenty of fluids but you should avoid alcoholic beverages for 24 hours.  ACTIVITY:  You should plan to take it easy for the rest of today and you should NOT DRIVE or use heavy machinery  until tomorrow (because of the sedation medicines used during the test).    FOLLOW UP: Our staff will call the number listed on your records 48-72 hours following your procedure to check on you and address any questions or concerns that you may have regarding the information given to you following your procedure. If we do not reach you, we will leave a message.  We will attempt to reach you two times.  During this call, we will ask if you have developed any symptoms of COVID 19. If you develop any symptoms (ie: fever, flu-like symptoms, shortness of breath, cough etc.) before then, please call [phone].  If you test positive for Covid 19 in the 2 weeks post procedure, please call and report this information to us.    If any biopsies were taken you will be contacted by phone or by letter within the next 1-3 weeks.  Please call us at [phone] if you have not heard about the biopsies in 3 weeks.    SIGNATURES/CONFIDENTIALITY: You and/or your care partner have signed paperwork which will be entered into your electronic medical record.  These signatures attest to the fact that that the information above on your After Visit Summary has been reviewed and is understood.  Full responsibility of the confidentiality of this discharge information lies with you and/or your care-partner. 

## 2021-05-07 NOTE — Progress Notes (Signed)
[name]  Called to room to assist during endoscopic procedure.  Patient ID and intended procedure confirmed with present staff. Received instructions for my participation in the procedure from the performing physician.   

## 2021-05-07 NOTE — Op Note (Addendum)
[place] Patient Name: Christine [name] Procedure Date: 05/07/2021 10:56 AM MRN: [id number] Endoscopist: Nicki Reaper E. Candis Schatz , MD Age: 85 Referring MD:  Date of Birth: 10/26/1935 Gender: Female Account #: [id number] Procedure:                Upper GI endoscopy Indications:              Nausea, Weight loss Medicines:                Monitored Anesthesia Care Procedure:                Pre-Anesthesia Assessment:                           - Prior to the procedure, a History and Physical                            was performed, and patient medications and                            allergies were reviewed. The patient's tolerance of                            previous anesthesia was also reviewed. The risks                            and benefits of the procedure and the sedation                            options and risks were discussed with the patient.                            All questions were answered, and informed consent                            was obtained. Prior Anticoagulants: The patient has                            taken no previous anticoagulant or antiplatelet                            agents. ASA Grade Assessment: III - A patient with                            severe systemic disease. After reviewing the risks                            and benefits, the patient was deemed in                            satisfactory condition to undergo the procedure.                           After obtaining informed consent, the endoscope was  passed under direct vision. Throughout the                            procedure, the patient's blood pressure, pulse, and                            oxygen saturations were monitored continuously. The                            Endoscope was introduced through the mouth, and                            advanced to the third part of duodenum. The upper                            GI endoscopy was  accomplished without difficulty.                            The patient tolerated the procedure well. Scope In: Scope Out: Findings:                 Diffuse glycogenic acanthosis was found in the                            entire esophagus.                           The exam of the esophagus was otherwise normal.                           A deformity was found in the gastric antrum,                            characterized an excavated/diverticular-like lesion                            in the prepyloric region and a prominant fold in                            the antrum. Biopsies were taken with a cold forceps                            for Helicobacter pylori testing. Estimated blood                            loss was minimal.                           Diffuse mildly congested mucosa was found in the                            gastric body. Biopsies were taken with a cold                            forceps for histology.  Estimated blood loss was                            minimal.                           A medium non-bleeding diverticulum was found in the                            second portion of the duodenum.                           The exam of the duodenum was otherwise normal. Complications:            No immediate complications. Estimated Blood Loss:     Estimated blood loss was minimal. Impression:               - Glycogenic acanthosis of the esophagus.                           - Acquired deformity in the gastric antrum.                            Biopsied. I suspect this is related to a history of                            peptic ulcer disease with subsequent healing                           - Congestive gastropathy. Biopsied.                           - Non-bleeding duodenal diverticulum.                           - No obvious causes of chronic nausea and weight                            loss. Recommendation:           - Patient has a contact number available for                             emergencies. The signs and symptoms of potential                            delayed complications were discussed with the                            patient. Return to normal activities tomorrow.                            Written discharge instructions were provided to the                            patient.                           -  Resume previous diet.                           - Continue present medications.                           - Await pathology results.                           - Proceed with gastric emptying study as planned. Dimitris Shanahan E. Candis Schatz, MD 05/07/2021 11:23:45 AM This report has been signed electronically.

## 2021-05-07 NOTE — Progress Notes (Signed)
pt tolerated well. VSS. awake and to recovery. Report given to RN.  Bite block removed with ease. No trauma.

## 2021-05-11 ENCOUNTER — Telehealth: Payer: Self-pay | Admitting: *Deleted

## 2021-05-11 ENCOUNTER — Ambulatory Visit (INDEPENDENT_AMBULATORY_CARE_PROVIDER_SITE_OTHER): Payer: Medicare Other | Admitting: Dietician

## 2021-05-11 ENCOUNTER — Encounter: Payer: Self-pay | Admitting: Dietician

## 2021-05-11 DIAGNOSIS — E1169 Type 2 diabetes mellitus with other specified complication: Secondary | ICD-10-CM | POA: Diagnosis not present

## 2021-05-11 NOTE — Progress Notes (Signed)
Medical Nutrition Therapy :  Start time: 1012 am  End time:  1033 am  Total time:21 Minutes Visit # 5   This is a telephone encounter between Christine [name]  and Christine [name]  on 05/11/2021 for Medical Nutrition Therapy. The visit was conducted with the patient located at home and Christine [name] at [facility]. The patient's identity was confirmed using their DOB and current address. The patient has consented to being evaluated through a telephone encounter and understands the associated risks / benefits (allows the patient to remain at home, decreasing exposure to coronavirus). I personally spent 21  minutes on medical nutrition therapy discussion.    Patient and/or legal guardian consented to telephone visit: yes   Assessment:  Primary concerns today: blood sugar control, diabetes diagnosed in June 2021. Christine [name] feels she is doing "Pretty good" She states that she has had all her COVID boosters. She did not schedule her eye exam. Phone number was provided to her today.  She continues her physical activity, eating healthy and smaller portions , checking blood sugar a few times a week and weighing herself.     She is still having nausea mostly after meals. Her reported diet is reasonable in fat content. Work up is in progress.   She rates her confidence in caring for her diabetes at a 3.0/5. would rate it higher if she had more success in getting blood sugar when checking hr blood sugar.   She decreased her weight 10 additional pounds in the past 3 months for a total of intentionally by  68 # since June 2021; her loss has been 5.2# per month which is a reasonable rate of weight loss. Her BMI is now 34. She states the orthopedic surgeon wants her to lose more weight.    ANTHROPOMETRICS:Estimated body mass index is 34.52 kg/m as calculated from the following:   Height as of 05/07/21: $RemoveBef'5\' 8"'[id number]$  (1.727 m).   Weight as of 05/07/21: 227 lb (103 kg).  BLOOD SUGAR: fasting today 98, 105. Her a1c is due again in  October.    Lab Results  Component Value Date   HGBA1C 5.6 01/27/2021   HGBA1C 6.5 (A) 07/17/2020   HGBA1C 7.1 (A) 03/04/2020   HGBA1C 6.9 (A) 12/11/2019   HGBA1C 6.7 (A) 08/14/2019    Wt Readings from Last 10 Encounters:  05/07/21 227 lb (103 kg)  04/28/21 227 lb 6.4 oz (103.1 kg)  03/16/21 232 lb 9.6 oz (105.5 kg)  03/03/21 236 lb 4.8 oz (107.2 kg)  01/27/21 237 lb (107.5 kg)  09/12/20 262 lb (118.8 kg)  09/02/20 262 lb 8 oz (119.1 kg)  08/19/20 266 lb 6.4 oz (120.8 kg)  08/12/20 270 lb 3.2 oz (122.6 kg)  07/17/20 274 lb 3.2 oz (124.4 kg)    DIETARY INTAKE: Usual eating pattern includes 3 meals and 0-1 snacks per day. Breakfast- 1/2 c grits, 2 boiled eggs or slice bread  Lunch- boiled chicken, tossed salad, green beans, pickled beets Fruit- canned lite Beverages- water, coffee, milk Everyday foods include fruit,  water, vegetables,  eggs, bread, Dining Out (times/week): none. Eats mostly vegetables, fruits, lean meat, whole grains. Every now and them "something  she shouldn't" be eating, but gets back on track right away. Her family is supportive and brings her fruits and healthy foods  Usual physical activity: sit and be fit -4 for 30 minutes  Walks a little bit- less now because her knee is bothering her     Progress Towards Goal(s):  Some progress.              Nutritional Diagnosis:  NB-1.1 Food and nutrition-related knowledge deficit As related to lack of sufficient prior diabetes and meal planning education shows continued improvement As evidenced by her report, A1C and weight decrease.                Intervention:  Nutrition education and support about taking care of her diabetes, .  Action Goals: shaking hand below heart to help blood get to end of her fingers,  call to make an eye appointment and continue to  weigh, eat healthy and do her non-weight bearing Activity   Outcome goal: lower blood sugars has been met- now in  maintenance stage;  would like her self care confidence to improve to at least a 4-5/5  Coordination of care:none  Teaching Method Utilized:  Auditory Handouts given during visit include: After visit summary Barriers to learning/adherence to lifestyle change: anxiety  Demonstrated degree of understanding via:  Teach Back    Monitoring/Evaluation:  Dietary intake, exercise, meter, and body weight in 3 month(s). Christine [name], RD 05/11/2021 10:03 AM.

## 2021-05-11 NOTE — Telephone Encounter (Signed)
  Follow up Call-  Call back number 05/07/2021  Post procedure Call Back phone  # [phone]  Permission to leave phone message Yes  Some recent data might be hidden     Patient questions: Message left to all [place] if necessary.

## 2021-05-11 NOTE — Patient Instructions (Signed)
Dear Christine [name],  You are doing a great job at caring for you and your diabetes!  Below are som of the things we talked about:   1- SHAKE Hand below  your heart level  to help blood get to end of your fingers,    then you can massage the finer gently before trying to prick it to get more blood.   2- Call Dr. Zenia Resides office to make an eye appointment  [phone]  3- Continue to  weigh, eat healthy and do the SIT and BE FIT Activity   Call anytime!  Sincerely,  [name] [phone]

## 2021-05-11 NOTE — Telephone Encounter (Signed)
  Follow up Call-  Call back number 05/07/2021  Post procedure Call Back phone  # [phone]  Permission to leave phone message Yes  Some recent data might be hidden     Patient questions:  Do you have a fever, pain , or abdominal swelling? No. Pain Score  0 *  Have you tolerated food without any problems? Yes.    Have you been able to return to your normal activities? Yes.    Do you have any questions about your discharge instructions: Diet   No. Medications  No. Follow up visit  No.  Do you have questions or concerns about your Care? No.  Actions: * If pain score is 4 or above: No action needed, pain <4.  Have you developed a fever since your procedure? no  2.   Have you had an respiratory symptoms (SOB or cough) since your procedure? no  3.   Have you tested positive for COVID 19 since your procedure no  4.   Have you had any family members/close contacts diagnosed with the COVID 19 since your procedure?  no   If yes to any of these questions please route to Joylene John, RN and Joella Prince, RN

## 2021-05-14 ENCOUNTER — Ambulatory Visit (HOSPITAL_COMMUNITY)
Admission: RE | Admit: 2021-05-14 | Discharge: 2021-05-14 | Disposition: A | Discharge status: Home / Self Care | Payer: Medicare Other | Source: Ambulatory Visit | Attending: Physician Assistant | Admitting: Physician Assistant

## 2021-05-14 ENCOUNTER — Other Ambulatory Visit: Payer: Self-pay

## 2021-05-14 DIAGNOSIS — K5900 Constipation, unspecified: Secondary | ICD-10-CM

## 2021-05-14 DIAGNOSIS — R6881 Early satiety: Secondary | ICD-10-CM | POA: Diagnosis not present

## 2021-05-14 DIAGNOSIS — R110 Nausea: Secondary | ICD-10-CM | POA: Diagnosis not present

## 2021-05-14 DIAGNOSIS — R634 Abnormal weight loss: Secondary | ICD-10-CM

## 2021-05-14 DIAGNOSIS — R112 Nausea with vomiting, unspecified: Secondary | ICD-10-CM | POA: Diagnosis not present

## 2021-05-14 DIAGNOSIS — R140 Abdominal distension (gaseous): Secondary | ICD-10-CM | POA: Diagnosis not present

## 2021-05-14 MED ORDER — TECHNETIUM TC 99M SULFUR COLLOID
2.0000 | Freq: Once | INTRAVENOUS | Status: AC
  Administered 2021-05-14: 2 via INTRAVENOUS

## 2021-05-20 ENCOUNTER — Encounter: Payer: Self-pay | Admitting: Gastroenterology

## 2021-05-20 ENCOUNTER — Telehealth: Payer: Self-pay | Admitting: Pharmacist

## 2021-05-20 NOTE — Telephone Encounter (Signed)
Left voicemail requesting call back to make appt for AWV.

## 2021-06-02 ENCOUNTER — Encounter: Payer: Medicare Other | Admitting: Internal Medicine

## 2021-06-03 ENCOUNTER — Telehealth: Payer: Self-pay

## 2021-06-03 NOTE — Telephone Encounter (Signed)
PA was sent to sent through cover my meds for Diclofenac Sodium 1% gel on 06/03/21 . Awaiting approval or denial

## 2021-06-09 ENCOUNTER — Ambulatory Visit (INDEPENDENT_AMBULATORY_CARE_PROVIDER_SITE_OTHER): Payer: Medicare Other | Admitting: Internal Medicine

## 2021-06-09 VITALS — BP 130/63 | HR 82 | Temp 97.6°F | Ht 68.0 in | Wt 233.8 lb

## 2021-06-09 DIAGNOSIS — M19041 Primary osteoarthritis, right hand: Secondary | ICD-10-CM | POA: Diagnosis not present

## 2021-06-09 DIAGNOSIS — M19042 Primary osteoarthritis, left hand: Secondary | ICD-10-CM | POA: Diagnosis not present

## 2021-06-09 DIAGNOSIS — M1120 Other chondrocalcinosis, unspecified site: Secondary | ICD-10-CM | POA: Diagnosis not present

## 2021-06-09 DIAGNOSIS — M1711 Unilateral primary osteoarthritis, right knee: Secondary | ICD-10-CM | POA: Diagnosis not present

## 2021-06-09 DIAGNOSIS — Z0000 Encounter for general adult medical examination without abnormal findings: Secondary | ICD-10-CM

## 2021-06-09 DIAGNOSIS — E1169 Type 2 diabetes mellitus with other specified complication: Secondary | ICD-10-CM

## 2021-06-09 DIAGNOSIS — F32A Depression, unspecified: Secondary | ICD-10-CM | POA: Diagnosis not present

## 2021-06-09 DIAGNOSIS — I10 Essential (primary) hypertension: Secondary | ICD-10-CM | POA: Diagnosis not present

## 2021-06-09 DIAGNOSIS — E66813 Obesity, class 3: Secondary | ICD-10-CM

## 2021-06-09 DIAGNOSIS — R1010 Upper abdominal pain, unspecified: Secondary | ICD-10-CM

## 2021-06-09 LAB — POCT GLYCOSYLATED HEMOGLOBIN (HGB A1C): Hemoglobin A1C: 5.7 % — AB (ref 4.0–5.6)

## 2021-06-09 LAB — GLUCOSE, CAPILLARY: Glucose-Capillary: 113 mg/dL — ABNORMAL HIGH (ref 70–99)

## 2021-06-09 NOTE — Progress Notes (Signed)
   Subjective:    Patient ID: Christine [name], female    DOB: Dec 23, 1935, 85 y.o.   MRN: [id number]  HPI  I have seen and examined this patient.  Patient is here for routine follow-up of her hypertension and diabetes.  Patient states that she feels well currently and denies any new complaints.  She states that she is compliant with all her medications.   Review of Systems  Constitutional: Negative.   HENT: Negative.    Respiratory: Negative.    Cardiovascular: Negative.   Gastrointestinal: Negative.   Musculoskeletal:  Positive for arthralgias.  Neurological: Negative.   Psychiatric/Behavioral: Negative.        Objective:   Physical Exam Constitutional:      Appearance: Normal appearance.  HENT:     Head: Normocephalic and atraumatic.  Cardiovascular:     Rate and Rhythm: Normal rate and regular rhythm.     Heart sounds: Normal heart sounds.  Pulmonary:     Effort: Pulmonary effort is normal.     Breath sounds: Normal breath sounds. No wheezing or rales.  Abdominal:     General: Bowel sounds are normal. There is no distension.     Palpations: Abdomen is soft.     Tenderness: There is no abdominal tenderness.  Musculoskeletal:        General: No swelling or tenderness.  Neurological:     General: No focal deficit present.     Mental Status: She is alert and oriented to person, place, and time.  Psychiatric:        Mood and Affect: Mood normal.        Behavior: Behavior normal.         Assessment & Plan:  Please see problem based charting for assessment and plan:

## 2021-06-09 NOTE — Telephone Encounter (Signed)
Pt PA for her Diclofenac Sod 1% gel was approved

## 2021-06-09 NOTE — Patient Instructions (Addendum)
-  It was a pleasure seeing you today -I will put in referral to the orthopedic surgeon for follow-up for possible new placement -Please get your shingles vaccine at the pharmacy -Please make an appoint with Dr. Carlis Abbott for eye exam -Continue with Voltaren gel.  Pain -Congratulations on your weight loss.  Please continue with your diet and exercise -Please call me with any questions or concerns or if you need any refills

## 2021-06-10 NOTE — Assessment & Plan Note (Signed)
-   Patient instructed to get a shingles vaccine at her pharmacy

## 2021-06-10 NOTE — Assessment & Plan Note (Signed)
-   This problem is chronic and stable -Rheumatology follow-up greatly appreciated -Patient is on colchicine as needed for this but has not had a flare in a while -No further work-up for now

## 2021-06-10 NOTE — Assessment & Plan Note (Signed)
-   This problem is chronic and stable -Patient has had intermittent episodes of depression since her son passed away -Her PHQ-9 score has improved to 6 from 11 in April -She states that she would like to drive again and thinks that she would have increased interest in activities after her knees have been replaced -We will continue to follow closely.  Patient remains off medication at this time. -No further work-up for now

## 2021-06-10 NOTE — Assessment & Plan Note (Signed)
BP Readings from Last 3 Encounters:  06/09/21 130/63  05/07/21 (!) 145/72  04/28/21 128/60    Lab Results  Component Value Date   NA 138 02/09/2021   K 4.8 02/09/2021   CREATININE 0.84 02/09/2021    Assessment: Blood pressure control:  Well-controlled Progress toward BP goal:   At goal Comments: Patient is compliant with Aldactone 25 mg and amlodipine 5 mg daily  Plan: Medications:  continue current medications Educational resources provided:   Self management tools provided:   Other plans: No further work-up at this time

## 2021-06-10 NOTE — Assessment & Plan Note (Addendum)
>>  ASSESSMENT AND PLAN FOR OSTEOARTHRITIS WRITTEN ON 06/01/2023  8:55 AM BY Mercie Eon, MD  >>ASSESSMENT AND PLAN FOR OSTEOARTHRITIS OF RIGHT KNEE WRITTEN ON 06/10/2021 12:22 PM BY NARENDRA, NISCHAL, MD  - This problem is chronic and stable -Patient still complains of persistent knee pain but states that pain has been better controlled after he received a steroid injection at rheumatology office -Patient was supposed to have bilateral knee replacement done by orthopedics but was advised to lose weight prior to the surgery. -Patient has lost approximately 60 pounds over the last year and a half -We will refer patient back to orthopedics for possible knee replacement  >>ASSESSMENT AND PLAN FOR PRIMARY OSTEOARTHRITIS OF BOTH HANDS WRITTEN ON 06/10/2021 12:22 PM BY NARENDRA, NISCHAL, MD  - Patient continues to complain of pain in the PIP and DIP joints of her fingers -She states that the Voltaren gel has been helping for this -She denies any other complaints at this time -Rheumatology advised joint strengthening exercises for her -No further work-up at this time

## 2021-06-10 NOTE — Assessment & Plan Note (Signed)
-   Patient continues to have intermittent nausea with associated abdominal discomfort -Extensive work-up including EGD/colonoscopy, CT chest/abdomen and pelvis, gastric emptying study and blood work have all been within normal limits -Patient states that she thinks it secondary to the diet that she is on as well as she notices increased nausea with certain types of foods like barbecue sauce -We will continue with Zofran as needed as well as Prilosec for her GERD -No further work-up at this time

## 2021-06-10 NOTE — Assessment & Plan Note (Signed)
Lab Results  Component Value Date   HGBA1C 5.7 (A) 06/09/2021   HGBA1C 5.6 01/27/2021   HGBA1C 6.5 (A) 07/17/2020     Assessment: Diabetes control:  Well-controlled Progress toward A1C goal:   At goal Comments: Patient remains off diabetic medications at this time.  Her weight loss has brought her A1c within goal.  Plan: Medications:  Not on any meds currently Home glucose monitoring: Frequency:   Timing:   Instruction/counseling given: reminded to get eye exam Educational resources provided:   Self management tools provided:   Other plans: No further work-up at this time

## 2021-06-10 NOTE — Assessment & Plan Note (Addendum)
-   This problem is chronic and stable -Patient still complains of persistent knee pain but states that pain has been better controlled after he received a steroid injection at rheumatology office -Patient was supposed to have bilateral knee replacement done by orthopedics but was advised to lose weight prior to the surgery. -Patient has lost approximately 60 pounds over the last year and a half -We will refer patient back to orthopedics for possible knee replacement

## 2021-06-10 NOTE — Assessment & Plan Note (Signed)
-   Patient continues to complain of pain in the PIP and DIP joints of her fingers -She states that the Voltaren gel has been helping for this -She denies any other complaints at this time -Rheumatology advised joint strengthening exercises for her -No further work-up at this time

## 2021-06-10 NOTE — Assessment & Plan Note (Signed)
-   This problem is chronic and improving -Patient has lost approximately 60 pounds over the last year and a half -Initially there was concern that some of this may be unintentional but work-up including EGD/colonoscopy, CT chest/abdomen and pelvis and gastric emptying study have all been normal -Patient to continue follow-up with [name] to help with weight loss and follow her recommendations for diet and exercise

## 2021-06-17 ENCOUNTER — Telehealth: Payer: Self-pay

## 2021-06-17 NOTE — Telephone Encounter (Signed)
Attempted to call patient- no answer or voice mail to leave a message.

## 2021-06-23 ENCOUNTER — Telehealth: Payer: Self-pay

## 2021-06-23 NOTE — Telephone Encounter (Signed)
Patient called, left VM to return the call to the office to schedule an AWV with a nurse.

## 2021-07-21 ENCOUNTER — Other Ambulatory Visit: Payer: Self-pay | Admitting: Internal Medicine

## 2021-07-21 DIAGNOSIS — I10 Essential (primary) hypertension: Secondary | ICD-10-CM

## 2021-08-16 ENCOUNTER — Other Ambulatory Visit: Payer: Self-pay | Admitting: Internal Medicine

## 2021-08-16 DIAGNOSIS — I10 Essential (primary) hypertension: Secondary | ICD-10-CM

## 2021-08-25 ENCOUNTER — Other Ambulatory Visit: Payer: Self-pay | Admitting: Internal Medicine

## 2021-08-25 DIAGNOSIS — K219 Gastro-esophageal reflux disease without esophagitis: Secondary | ICD-10-CM
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
## 2021-09-14 ENCOUNTER — Other Ambulatory Visit: Payer: Self-pay

## 2021-09-14 ENCOUNTER — Telehealth: Payer: Self-pay

## 2021-09-14 ENCOUNTER — Encounter: Payer: Self-pay | Admitting: Physician Assistant

## 2021-09-14 ENCOUNTER — Ambulatory Visit (INDEPENDENT_AMBULATORY_CARE_PROVIDER_SITE_OTHER): Payer: Medicare Other | Admitting: Physician Assistant

## 2021-09-14 ENCOUNTER — Ambulatory Visit: Payer: Self-pay

## 2021-09-14 VITALS — BP 149/78 | HR 83 | Ht 68.25 in | Wt 239.4 lb

## 2021-09-14 DIAGNOSIS — M19041 Primary osteoarthritis, right hand: Secondary | ICD-10-CM

## 2021-09-14 DIAGNOSIS — G8929 Other chronic pain: Secondary | ICD-10-CM | POA: Diagnosis not present

## 2021-09-14 DIAGNOSIS — Z5181 Encounter for therapeutic drug level monitoring: Secondary | ICD-10-CM

## 2021-09-14 DIAGNOSIS — L409 Psoriasis, unspecified: Secondary | ICD-10-CM | POA: Diagnosis not present

## 2021-09-14 DIAGNOSIS — Z8639 Personal history of other endocrine, nutritional and metabolic disease: Secondary | ICD-10-CM | POA: Diagnosis not present

## 2021-09-14 DIAGNOSIS — Z6835 Body mass index (BMI) 35.0-35.9, adult: Secondary | ICD-10-CM

## 2021-09-14 DIAGNOSIS — M160 Bilateral primary osteoarthritis of hip: Secondary | ICD-10-CM | POA: Diagnosis not present

## 2021-09-14 DIAGNOSIS — M7061 Trochanteric bursitis, right hip: Secondary | ICD-10-CM

## 2021-09-14 DIAGNOSIS — Z8679 Personal history of other diseases of the circulatory system: Secondary | ICD-10-CM | POA: Diagnosis not present

## 2021-09-14 DIAGNOSIS — M1711 Unilateral primary osteoarthritis, right knee: Secondary | ICD-10-CM

## 2021-09-14 DIAGNOSIS — M25561 Pain in right knee: Secondary | ICD-10-CM

## 2021-09-14 DIAGNOSIS — M1120 Other chondrocalcinosis, unspecified site: Secondary | ICD-10-CM

## 2021-09-14 DIAGNOSIS — R7303 Prediabetes: Secondary | ICD-10-CM | POA: Diagnosis not present

## 2021-09-14 DIAGNOSIS — Z8719 Personal history of other diseases of the digestive system: Secondary | ICD-10-CM | POA: Diagnosis not present

## 2021-09-14 DIAGNOSIS — M19042 Primary osteoarthritis, left hand: Secondary | ICD-10-CM

## 2021-09-14 MED ORDER — LIDOCAINE HCL 1 % IJ SOLN
1.5000 mL | INTRAMUSCULAR | Status: AC | PRN
  Administered 2021-09-14: 1.5 mL

## 2021-09-14 MED ORDER — TRIAMCINOLONE ACETONIDE 40 MG/ML IJ SUSP
40.0000 mg | INTRAMUSCULAR | Status: AC | PRN
  Administered 2021-09-14: 40 mg via INTRA_ARTICULAR

## 2021-09-14 NOTE — Patient Instructions (Signed)
Knee Exercises Ask your health care provider which exercises are safe for you. Do exercises exactly as told by your health care provider and adjust them as directed. It is normal to feel mild stretching, pulling, tightness, or discomfort as you do these exercises. Stop right away if you feel sudden pain or your pain gets worse. Do not begin these exercises until told by your health care provider. Stretching and range-of-motion exercises These exercises warm up your muscles and joints and improve the movement and flexibility of your knee. These exercises also help to relieve pain and swelling. Knee extension, prone  Lie on your abdomen (prone position) on a bed. Place your left / right knee just beyond the edge of the surface so your knee is not on the bed. You can put a towel under your left / right thigh just above your kneecap for comfort. Relax your leg muscles and allow gravity to straighten your knee (extension). You should feel a stretch behind your left / right knee. Hold this position for __________ seconds. Scoot up so your knee is supported between repetitions. Repeat __________ times. Complete this exercise __________ times a day. Knee flexion, active  Lie on your back with both legs straight. If this causes back discomfort, bend your left / right knee so your foot is flat on the floor. Slowly slide your left / right heel back toward your buttocks. Stop when you feel a gentle stretch in the front of your knee or thigh (flexion). Hold this position for __________ seconds. Slowly slide your left / right heel back to the starting position. Repeat __________ times. Complete this exercise __________ times a day. Quadriceps stretch, prone  Lie on your abdomen on a firm surface, such as a bed or padded floor. Bend your left / right knee and hold your ankle. If you cannot reach your ankle or pant leg, loop a belt around your foot and grab the belt instead. Gently pull your heel toward your  buttocks. Your knee should not slide out to the side. You should feel a stretch in the front of your thigh and knee (quadriceps). Hold this position for __________ seconds. Repeat __________ times. Complete this exercise __________ times a day. Hamstring, supine  Lie on your back (supine position). Loop a belt or towel over the ball of your left / right foot. The ball of your foot is on the walking surface, right under your toes. Straighten your left / right knee and slowly pull on the belt to raise your leg until you feel a gentle stretch behind your knee (hamstring). Do not let your knee bend while you do this. Keep your other leg flat on the floor. Hold this position for __________ seconds. Repeat __________ times. Complete this exercise __________ times a day. Strengthening exercises These exercises build strength and endurance in your knee. Endurance is the ability to use your muscles for a long time, even after they get tired. Quadriceps, isometric This exercise strengthens the muscles in front of your thigh (quadriceps) without moving your knee joint (isometric). Lie on your back with your left / right leg extended and your other knee bent. Put a rolled towel or small pillow under your knee if told by your health care provider. Slowly tense the muscles in the front of your left / right thigh. You should see your kneecap slide up toward your hip or see increased dimpling just above the knee. This motion will push the back of the knee toward the floor.   For __________ seconds, hold the muscle as tight as you can without increasing your pain. Relax the muscles slowly and completely. Repeat __________ times. Complete this exercise __________ times a day. Straight leg raises This exercise strengthens the muscles in front of your thigh (quadriceps) and the muscles that move your hips (hip flexors). Lie on your back with your left / right leg extended and your other knee bent. Tense the  muscles in the front of your left / right thigh. You should see your kneecap slide up or see increased dimpling just above the knee. Your thigh may even shake a bit. Keep these muscles tight as you raise your leg 4-6 inches (10-15 cm) off the floor. Do not let your knee bend. Hold this position for __________ seconds. Keep these muscles tense as you lower your leg. Relax your muscles slowly and completely after each repetition. Repeat __________ times. Complete this exercise __________ times a day. Hamstring, isometric  Lie on your back on a firm surface. Bend your left / right knee about __________ degrees. Dig your left / right heel into the surface as if you are trying to pull it toward your buttocks. Tighten the muscles in the back of your thighs (hamstring) to "dig" as hard as you can without increasing any pain. Hold this position for __________ seconds. Release the tension gradually and allow your muscles to relax completely for __________ seconds after each repetition. Repeat __________ times. Complete this exercise __________ times a day. Hamstring curls If told by your health care provider, do this exercise while wearing ankle weights. Begin with __________lb / kg weights. Then increase the weight by 1 lb (0.5 kg) increments. Do not wear ankle weights that are more than __________lb / kg. Lie on your abdomen with your legs straight. Bend your left / right knee as far as you can without feeling pain. Keep your hips flat against the floor. Hold this position for __________ seconds. Slowly lower your leg to the starting position. Repeat __________ times. Complete this exercise __________ times a day. Squats This exercise strengthens the muscles in front of your thigh and knee (quadriceps). Stand in front of a table, with your feet and knees pointing straight ahead. You may rest your hands on the table for balance but not for support. Slowly bend your knees and lower your hips like you  are going to sit in a chair. Keep your weight over your heels, not over your toes. Keep your lower legs upright so they are parallel with the table legs. Do not let your hips go lower than your knees. Do not bend lower than told by your health care provider. If your knee pain increases, do not bend as low. Hold the squat position for __________ seconds. Slowly push with your legs to return to standing. Do not use your hands to pull yourself to standing. Repeat __________ times. Complete this exercise __________ times a day. Wall slides This exercise strengthens the muscles in front of your thigh and knee (quadriceps). Lean your back against a smooth wall or door, and walk your feet out 18-24 inches (46-61 cm) from it. Place your feet hip-width apart. Slowly slide down the wall or door until your knees bend __________ degrees. Keep your knees over your heels, not over your toes. Keep your knees in line with your hips. Hold this position for __________ seconds. Repeat __________ times. Complete this exercise __________ times a day. Straight leg raises, side-lying This exercise strengthens the muscles that rotate   the leg at the hip and move it away from your body (hip abductors). Lie on your side with your left / right leg in the top position. Lie so your head, shoulder, knee, and hip line up. You may bend your bottom knee to help you keep your balance. Roll your hips slightly forward so your hips are stacked directly over each other and your left / right knee is facing forward. Leading with your heel, lift your top leg 4-6 inches (10-15 cm). You should feel the muscles in your outer hip lifting. Do not let your foot drift forward. Do not let your knee roll toward the ceiling. Hold this position for __________ seconds. Slowly return your leg to the starting position. Let your muscles relax completely after each repetition. Repeat __________ times. Complete this exercise __________ times a  day. Straight leg raises, prone This exercise stretches the muscles that move your hips away from the front of the pelvis (hip extensors). Lie on your abdomen on a firm surface. You can put a pillow under your hips if that is more comfortable. Tense the muscles in your buttocks and lift your left / right leg about 4-6 inches (10-15 cm). Keep your knee straight as you lift your leg. Hold this position for __________ seconds. Slowly lower your leg to the starting position. Let your leg relax completely after each repetition. Repeat __________ times. Complete this exercise __________ times a day. This information is not intended to replace advice given to you by your health care provider. Make sure you discuss any questions you have with your health care provider. Document Revised: 06/09/2021 Document Reviewed: 06/09/2021 Elsevier Patient Education  2022 [employer]  

## 2021-09-14 NOTE — Telephone Encounter (Signed)
Please apply for right knee visco, per Taylor Dale, PA-C. Thanks!  

## 2021-09-14 NOTE — Progress Notes (Signed)
Medication Samples have been provided to the patient.  Drug name: Pennsaid       Strength: 2%        Qty: 1 box (4 packets)  LOT: [id number]  Exp.Date: 04/09/2022  Dosing instructions: Apply contents of one packet to affected knee two times a day.

## 2021-09-15 ENCOUNTER — Ambulatory Visit (INDEPENDENT_AMBULATORY_CARE_PROVIDER_SITE_OTHER): Payer: Medicare Other | Admitting: Internal Medicine

## 2021-09-15 VITALS — BP 143/73 | HR 81 | Temp 97.9°F | Ht 67.0 in | Wt 236.8 lb

## 2021-09-15 DIAGNOSIS — Z23 Encounter for immunization: Secondary | ICD-10-CM | POA: Diagnosis not present

## 2021-09-15 DIAGNOSIS — E1169 Type 2 diabetes mellitus with other specified complication: Secondary | ICD-10-CM | POA: Diagnosis not present

## 2021-09-15 DIAGNOSIS — E7849 Other hyperlipidemia: Secondary | ICD-10-CM

## 2021-09-15 DIAGNOSIS — F32A Depression, unspecified: Secondary | ICD-10-CM

## 2021-09-15 DIAGNOSIS — K219 Gastro-esophageal reflux disease without esophagitis: Secondary | ICD-10-CM

## 2021-09-15 DIAGNOSIS — M1711 Unilateral primary osteoarthritis, right knee: Secondary | ICD-10-CM | POA: Diagnosis not present

## 2021-09-15 DIAGNOSIS — I10 Essential (primary) hypertension: Secondary | ICD-10-CM | POA: Diagnosis not present

## 2021-09-15 DIAGNOSIS — M1120 Other chondrocalcinosis, unspecified site: Secondary | ICD-10-CM

## 2021-09-15 DIAGNOSIS — Z0000 Encounter for general adult medical examination without abnormal findings: Secondary | ICD-10-CM

## 2021-09-15 LAB — COMPLETE METABOLIC PANEL WITH GFR
AG Ratio: 1.1 (calc) (ref 1.0–2.5)
ALT: 11 U/L (ref 6–29)
AST: 18 U/L (ref 10–35)
Albumin: 4.3 g/dL (ref 3.6–5.1)
Alkaline phosphatase (APISO): 86 U/L (ref 37–153)
BUN/Creatinine Ratio: 28 (calc) — ABNORMAL HIGH (ref 6–22)
BUN: 27 mg/dL — ABNORMAL HIGH (ref 7–25)
CO2: 29 mmol/L (ref 20–32)
Calcium: 9.9 mg/dL (ref 8.6–10.4)
Chloride: 97 mmol/L — ABNORMAL LOW (ref 98–110)
Creat: 0.96 mg/dL — ABNORMAL HIGH (ref 0.60–0.95)
Globulin: 3.9 g/dL (calc) — ABNORMAL HIGH (ref 1.9–3.7)
Glucose, Bld: 110 mg/dL — ABNORMAL HIGH (ref 65–99)
Potassium: 4.6 mmol/L (ref 3.5–5.3)
Sodium: 137 mmol/L (ref 135–146)
Total Bilirubin: 0.5 mg/dL (ref 0.2–1.2)
Total Protein: 8.2 g/dL — ABNORMAL HIGH (ref 6.1–8.1)
eGFR: 59 mL/min/{1.73_m2} — ABNORMAL LOW (ref >=60)

## 2021-09-15 LAB — CBC WITH DIFFERENTIAL/PLATELET
Absolute Monocytes: 270 cells/uL (ref 200–950)
Basophils Absolute: 18 cells/uL (ref 0–200)
Basophils Relative: 0.3 %
Eosinophils Absolute: 42 cells/uL (ref 15–500)
Eosinophils Relative: 0.7 %
HCT: 41.2 % (ref 35.0–45.0)
Hemoglobin: 13.6 g/dL (ref 11.7–15.5)
Lymphs Abs: 1626 cells/uL (ref 850–3900)
MCH: 28.8 pg (ref 27.0–33.0)
MCHC: 33 g/dL (ref 32.0–36.0)
MCV: 87.3 fL (ref 80.0–100.0)
MPV: 10.3 fL (ref 7.5–12.5)
Monocytes Relative: 4.5 %
Neutro Abs: 4044 cells/uL (ref 1500–7800)
Neutrophils Relative %: 67.4 %
Platelets: 183 10*3/uL (ref 140–400)
RBC: 4.72 10*6/uL (ref 3.80–5.10)
RDW: 13.2 % (ref 11.0–15.0)
Total Lymphocyte: 27.1 %
WBC: 6 10*3/uL (ref 3.8–10.8)

## 2021-09-15 LAB — GLUCOSE, CAPILLARY: Glucose-Capillary: 107 mg/dL — ABNORMAL HIGH (ref 70–99)

## 2021-09-15 LAB — POCT GLYCOSYLATED HEMOGLOBIN (HGB A1C): Hemoglobin A1C: 5.8 % — AB (ref 4.0–5.6)

## 2021-09-15 NOTE — Progress Notes (Signed)
   Subjective:    Patient ID: Christine [name], female    DOB: 1936-08-09, 85 y.o.   MRN: [id number]  Diabetes  Medication Refill  Knee Pain    I have seen and examined this patient.  Patient is here for routine follow-up of her hypertension and diabetes.  Patient follow-up with rheumatologist today and states that she is unsure if she wants to get her knee replacement done.  Patient did have 1 episode yesterday where she states that after she ate she developed some pressure in her epigastric region that resolved on its own.  She denies any further episodes of this.  She states that she is compliant with all her medications and denies any other complaints at this time.  Review of Systems  Constitutional: Negative.   HENT: Negative.    Respiratory: Negative.    Cardiovascular: Negative.   Gastrointestinal:        Complaint 1 episode of pressure in her epigastric region yesterday after eating which resolved on its own  Musculoskeletal: Negative.   Skin: Negative.   Neurological: Negative.   Psychiatric/Behavioral: Negative.        Objective:   Physical Exam Constitutional:      Appearance: Normal appearance.  HENT:     Head: Normocephalic and atraumatic.  Eyes:     Pupils: Pupils are equal, round, and reactive to light.  Cardiovascular:     Rate and Rhythm: Normal rate and regular rhythm.     Heart sounds: Normal heart sounds.  Pulmonary:     Breath sounds: Normal breath sounds. No wheezing or rales.  Abdominal:     General: Bowel sounds are normal. There is no distension.     Palpations: Abdomen is soft.     Tenderness: There is no abdominal tenderness. There is no guarding.  Musculoskeletal:        General: No swelling or tenderness.     Cervical back: Neck supple.  Lymphadenopathy:     Cervical: No cervical adenopathy.  Neurological:     General: No focal deficit present.     Mental Status: She is alert and oriented to person, place, and time.  Psychiatric:         Mood and Affect: Mood normal.        Behavior: Behavior normal.          Assessment & Plan:  Please see problem based charting for assessment and plan:

## 2021-09-15 NOTE — Progress Notes (Signed)
58

## 2021-09-15 NOTE — Assessment & Plan Note (Signed)
-   Patient complained of 1 episode yesterday of pressure in epigastric region after eating but states that the pain resolved spontaneously -She states that she has not had any heartburn symptoms since starting the Prilosec -I suspect that the episode of pressure yesterday was secondary to gas which appears to resolved spontaneously -Her abdominal exam remains benign today and she has had no further episodes of pain -No further work-up at this time

## 2021-09-15 NOTE — Assessment & Plan Note (Signed)
-   This problem is chronic and stable -We will recheck lipid panel at next visit -Continue with atorvastatin 40 mg daily for now

## 2021-09-15 NOTE — Progress Notes (Signed)
CBC WNL.  Creatinine is borderline elevated-0.96 and GFR is borderline low-59.  Glucose is 110.  Total protein is borderline elevated-8.2.

## 2021-09-15 NOTE — Assessment & Plan Note (Addendum)
Lab Results  Component Value Date   HGBA1C 5.8 (A) 09/15/2021   HGBA1C 5.7 (A) 06/09/2021   HGBA1C 5.6 01/27/2021     Assessment: Diabetes control:  Well-controlled Progress toward A1C goal:   At goal Comments: Patient is now off all her diabetic medications.  She continues with diet and exercise.  Plan: Medications:  Continue with diabetic diet and exercise. Home glucose monitoring: Frequency:   Timing:   Instruction/counseling given: reminded to get eye exam Educational resources provided:   Self management tools provided:   Other plans: We will check BMP at next visit.  We will check urine microalbumin today.  Foot exam done today.  Dorsalis pedis pulses are 2+ bilaterally

## 2021-09-15 NOTE — Assessment & Plan Note (Addendum)
-   This problem is chronic and stable -Patient still complains of persistent knee pain.  She did receive a steroid injection at the rheumatologist office yesterday and states that her knee pain is better controlled. -She remains unsure if she wants to have a knee replacement done.  She discussed with rheumatology yesterday and they stated that they would help her with viscous gel injections as well as steroid injections as needed -Patient was also given Voltaren gel 2% to try at home to see if this would help with her pain as well by the rheumatologist.  If it does help they will prescribe this for her -I explained to the patient that these are likely temporizing measures and that the pain would likely persist given her severe OA -Patient also has lost approximately 60 pounds over the last 1 and half years which has likely helped with her pain as well -Patient also did not appear to understand how the knee replacement would work.  I encouraged her to follow-up with her orthopedic physician and discuss the risks and benefits of the procedure with him. -Patient to follow-up with Ortho for further evaluation for making informed decision as to whether she wants to continue with current medical management versus having a knee replacement

## 2021-09-15 NOTE — Assessment & Plan Note (Signed)
BP Readings from Last 3 Encounters:  09/15/21 (!) 143/73  09/14/21 (!) 149/78  06/09/21 130/63    Lab Results  Component Value Date   NA 137 09/14/2021   K 4.6 09/14/2021   CREATININE 0.96 (H) 09/14/2021    Assessment: Blood pressure control:  Fair Progress toward BP goal:   Unchanged Comments: Patient is compliant with spironolactone 25 mg daily as well as amlodipine 5 mg daily  Plan: Medications:  continue current medications Educational resources provided:   Self management tools provided:   Other plans: If patient blood pressure remains elevated would consider increasing amlodipine to 10 mg daily

## 2021-09-15 NOTE — Assessment & Plan Note (Signed)
-   This problem is chronic and improving -Patient has lost approximately 60 pounds over the last 1 and half years with diet and exercise and her weight appears to be stable in the 220s to 230s -We will continue to monitor closely -No further work-up at this time

## 2021-09-15 NOTE — Patient Instructions (Signed)
-  It was a pleasure seeing you today.  Have a great holiday season! -Your blood pressure is well controlled.  Keep up the good work -Your A1c is well controlled.  Please continue with your diet and exercise -Please follow-up with orthopedic surgeon to discuss possible knee replacement and see if this is the right option for you -We will check a urine test on you today -We will give you a flu shot today -Please call me if you have any questions or concerns or if you need any refills -For constipation the over-the-counter medications he can use are MiraLAX or senna.  Please have me know if you have any issues with these medications

## 2021-09-15 NOTE — Assessment & Plan Note (Signed)
-   This problem is chronic and stable -Patient has had intermittent episodes of depression since her son passed away -Her PHQ-9 score is relatively stable at 8 today -We will continue to monitor closely off medications -No further work-up at this time

## 2021-09-15 NOTE — Assessment & Plan Note (Signed)
>>  ASSESSMENT AND PLAN FOR OSTEOARTHRITIS OF RIGHT KNEE WRITTEN ON 09/15/2021 11:19 AM BY NARENDRA, NISCHAL, MD  - This problem is chronic and stable -Patient still complains of persistent knee pain.  She did receive a steroid injection at the rheumatologist office yesterday and states that her knee pain is better controlled. -She remains unsure if she wants to have a knee replacement done.  She discussed with rheumatology yesterday and they stated that they would help her with viscous gel injections as well as steroid injections as needed -Patient was also given Voltaren gel 2% to try at home to see if this would help with her pain as well by the rheumatologist.  If it does help they will prescribe this for her -I explained to the patient that these are likely temporizing measures and that the pain would likely persist given her severe OA -Patient also has lost approximately 60 pounds over the last 1 and half years which has likely helped with her pain as well -Patient also did not appear to understand how the knee replacement would work.  I encouraged her to follow-up with her orthopedic physician and discuss the risks and benefits of the procedure with him. -Patient to follow-up with Ortho for further evaluation for making informed decision as to whether she wants to continue with current medical management versus having a knee replacement

## 2021-09-15 NOTE — Assessment & Plan Note (Signed)
-   This problem is chronic and stable -Rheumatology follow-up appreciated -Patient is possibly on colchicine daily for this but states that she takes it as needed and that it is helping -We will continue with colchicine as needed for now -No further work-up at this time

## 2021-09-15 NOTE — Assessment & Plan Note (Signed)
-   Flu shot given today -Patient states that she has gotten her COVID booster -No further work-up at this time

## 2021-09-16 LAB — MICROALBUMIN / CREATININE URINE RATIO
Creatinine, Urine: 59.4 mg/dL
Microalb/Creat Ratio: <5 mg/g creat (ref 0–29)
Microalbumin, Urine: <3 ug/mL

## 2021-09-20 ENCOUNTER — Other Ambulatory Visit: Payer: Self-pay | Admitting: Internal Medicine

## 2021-09-20 DIAGNOSIS — M1711 Unilateral primary osteoarthritis, right knee: Secondary | ICD-10-CM

## 2021-09-29 ENCOUNTER — Other Ambulatory Visit: Payer: Self-pay | Admitting: *Deleted

## 2021-09-29 MED ORDER — DICLOFENAC SODIUM 2 % EX SOLN: 2.0000 | Freq: Two times a day (BID) | CUTANEOUS | 0 refills | Status: DC

## 2021-09-29 NOTE — Addendum Note (Signed)
Addended by: Carole Binning on: 09/29/2021 02:22 PM   Modules accepted: Orders

## 2021-09-29 NOTE — Telephone Encounter (Signed)
Ok to send prescription for pennsaid to pharmacy.

## 2021-09-29 NOTE — Addendum Note (Signed)
Addended by: Carole Binning on: 09/29/2021 01:59 PM   Modules accepted: Orders

## 2021-09-29 NOTE — Telephone Encounter (Signed)
Patient states she was given a sample of Pennsaid. Patient states this has worked well for her and she would like a prescription. Patient has tried Voltaren Gel which does not work as well. Patient has tried and failed NSAIDS. Patient would like the [facility] to [facility] [address].

## 2021-10-13 ENCOUNTER — Other Ambulatory Visit: Payer: Self-pay

## 2021-10-13 DIAGNOSIS — E119 Type 2 diabetes mellitus without complications: Secondary | ICD-10-CM

## 2021-10-13 MED ORDER — ONETOUCH VERIO VI STRP: ORAL_STRIP | 3 refills | Status: DC

## 2021-10-13 NOTE — Telephone Encounter (Signed)
glucose blood (ONETOUCH VERIO) test strip, REFILL REQUEST @ [id number] - [place], [place] - [address] AT [address].

## 2021-10-16 ENCOUNTER — Telehealth: Payer: Self-pay

## 2021-10-16 NOTE — Telephone Encounter (Signed)
VOB submitted for Orthovisc, right knee BV pending 

## 2021-10-19 NOTE — Telephone Encounter (Signed)
VOB submitted for Orthovisc, right knee. BV pending - April Jackson 10/16/2021

## 2021-10-29 ENCOUNTER — Other Ambulatory Visit: Payer: Self-pay | Admitting: Dietician

## 2021-10-29 DIAGNOSIS — E1169 Type 2 diabetes mellitus with other specified complication: Secondary | ICD-10-CM

## 2021-10-29 NOTE — Progress Notes (Unsigned)
Referral request

## 2021-11-05 NOTE — Telephone Encounter (Signed)
Called patient to schedule Orthovisc injections for her right knee.  Patient states she will check her schedule and call back next week.

## 2021-11-05 NOTE — Telephone Encounter (Signed)
Please call to schedule Visco knee injections.  Authorized for Orthovisc series Right Knee Buy and Buy PA not required Patient responsible for 20% coinsurance once deductible has been met $226 (met $0) Ref #[id number]

## 2021-11-18 ENCOUNTER — Other Ambulatory Visit: Payer: Self-pay | Admitting: Internal Medicine

## 2021-11-18 DIAGNOSIS — J309 Allergic rhinitis, unspecified: Secondary | ICD-10-CM

## 2021-11-18 DIAGNOSIS — I10 Essential (primary) hypertension: Secondary | ICD-10-CM

## 2021-11-18 MED ORDER — SPIRONOLACTONE 25 MG PO TABS: 25.0000 mg | ORAL_TABLET | Freq: Every day | ORAL | 1 refills | Status: DC

## 2021-11-18 MED ORDER — LORATADINE 10 MG PO TABS: ORAL_TABLET | ORAL | 1 refills | Status: DC

## 2021-11-18 MED ORDER — AMLODIPINE BESYLATE 5 MG PO TABS: 5.0000 mg | ORAL_TABLET | Freq: Every day | ORAL | 1 refills | Status: DC

## 2021-11-18 NOTE — Telephone Encounter (Signed)
Refill Request   amLODipine (NORVASC) 5 MG tablet  loratadine (CLARITIN) 10 MG tablet  spironolactone (ALDACTONE) 25 MG tablet  [id number] [place],  - [place] (Ph: [phone])

## 2021-11-30 ENCOUNTER — Other Ambulatory Visit: Payer: Self-pay

## 2021-11-30 ENCOUNTER — Ambulatory Visit (INDEPENDENT_AMBULATORY_CARE_PROVIDER_SITE_OTHER): Payer: Medicare Other | Admitting: Physician Assistant

## 2021-11-30 DIAGNOSIS — M1711 Unilateral primary osteoarthritis, right knee: Secondary | ICD-10-CM | POA: Diagnosis not present

## 2021-11-30 MED ORDER — LIDOCAINE HCL 1 % IJ SOLN
1.5000 mL | INTRAMUSCULAR | Status: AC | PRN
  Administered 2021-11-30: 1.5 mL via INTRA_ARTICULAR

## 2021-11-30 MED ORDER — HYALURONAN 30 MG/2ML IX SOSY
30.0000 mg | PREFILLED_SYRINGE | INTRA_ARTICULAR | Status: AC | PRN
  Administered 2021-11-30: 30 mg via INTRA_ARTICULAR

## 2021-11-30 NOTE — Progress Notes (Signed)
° °  Procedure Note  Patient: Christine [name]             Date of Birth: 1936/09/06           MRN: [id number]             Visit Date: 11/30/2021  Procedures: Visit Diagnoses:  1. Primary osteoarthritis of right knee     Orthovisc #1 right knee, B/B Large Joint Inj: R knee on 11/30/2021 8:02 AM Indications: pain Details: 25 G 1.5 in needle, medial approach  Arthrogram: No  Medications: 1.5 mL lidocaine 1 %; 30 mg Hyaluronan 30 MG/2ML Aspirate: 0 mL Outcome: tolerated well, no immediate complications Procedure, treatment alternatives, risks and benefits explained, specific risks discussed. Consent was given by the patient. Immediately prior to procedure a time out was called to verify the correct patient, procedure, equipment, support staff and site/side marked as required. Patient was prepped and draped in the usual sterile fashion.    Patient tolerated the procedure well.  Aftercare was discussed.  Hazel Sams, PA-C

## 2021-12-07 ENCOUNTER — Other Ambulatory Visit: Payer: Self-pay

## 2021-12-07 ENCOUNTER — Ambulatory Visit (INDEPENDENT_AMBULATORY_CARE_PROVIDER_SITE_OTHER): Payer: Medicare Other | Admitting: Physician Assistant

## 2021-12-07 DIAGNOSIS — M1711 Unilateral primary osteoarthritis, right knee: Secondary | ICD-10-CM | POA: Diagnosis not present

## 2021-12-07 MED ORDER — HYALURONAN 30 MG/2ML IX SOSY
30.0000 mg | PREFILLED_SYRINGE | INTRA_ARTICULAR | Status: AC | PRN
  Administered 2021-12-07: 30 mg via INTRA_ARTICULAR

## 2021-12-07 MED ORDER — LIDOCAINE HCL 1 % IJ SOLN
1.5000 mL | INTRAMUSCULAR | Status: AC | PRN
  Administered 2021-12-07: 1.5 mL via INTRA_ARTICULAR

## 2021-12-07 NOTE — Progress Notes (Signed)
° °  Procedure Note  Patient: Christine [name]             Date of Birth: [birth year]           MRN: [id number]             Visit Date: 12/07/2021  Procedures: Visit Diagnoses:  1. Primary osteoarthritis of right knee    Orthovisc #2 right knee, B/B Large Joint Inj: R knee on 12/07/2021 8:10 AM Indications: pain Details: 25 G 1.5 in needle, medial approach  Arthrogram: No  Medications: 1.5 mL lidocaine 1 %; 30 mg Hyaluronan 30 MG/2ML Aspirate: 0 mL Outcome: tolerated well, no immediate complications Procedure, treatment alternatives, risks and benefits explained, specific risks discussed. Consent was given by the patient. Immediately prior to procedure a time out was called to verify the correct patient, procedure, equipment, support staff and site/side marked as required. Patient was prepped and draped in the usual sterile fashion.    Patient tolerated the procedure well. Aftercare was discussed.  Hazel Sams, PA-C

## 2021-12-14 ENCOUNTER — Ambulatory Visit (INDEPENDENT_AMBULATORY_CARE_PROVIDER_SITE_OTHER): Payer: Medicare Other | Admitting: Physician Assistant

## 2021-12-14 ENCOUNTER — Other Ambulatory Visit: Payer: Self-pay

## 2021-12-14 DIAGNOSIS — M1711 Unilateral primary osteoarthritis, right knee: Secondary | ICD-10-CM

## 2021-12-14 MED ORDER — HYALURONAN 30 MG/2ML IX SOSY
30.0000 mg | PREFILLED_SYRINGE | INTRA_ARTICULAR | Status: AC | PRN
  Administered 2021-12-14: 30 mg via INTRA_ARTICULAR

## 2021-12-14 MED ORDER — LIDOCAINE HCL 1 % IJ SOLN
1.5000 mL | INTRAMUSCULAR | Status: AC | PRN
  Administered 2021-12-14: 1.5 mL via INTRA_ARTICULAR

## 2021-12-14 NOTE — Progress Notes (Signed)
? ?  Procedure Note ? ?Patient: Christine [name]             ?Date of Birth: 1935/11/05           ?MRN: [id number]             ?Visit Date: 12/14/2021 ? ?Procedures: ?Visit Diagnoses:  ?1. Primary osteoarthritis of right knee   ? ? ?Orthovisc #3 B/B, right knee  ?Large Joint Inj: R knee on 12/14/2021 8:03 AM ?Indications: pain ?Details: 25 G 1.5 in needle, medial approach ? ?Arthrogram: No ? ?Medications: 1.5 mL lidocaine 1 %; 30 mg Hyaluronan 30 MG/2ML ?Aspirate: 0 mL ?Outcome: tolerated well, no immediate complications ?Procedure, treatment alternatives, risks and benefits explained, specific risks discussed. Consent was given by the patient. Immediately prior to procedure a time out was called to verify the correct patient, procedure, equipment, support staff and site/side marked as required. Patient was prepped and draped in the usual sterile fashion.  ? ? ?Patient tolerated the procedure well.  Aftercare was discussed.  ?Hazel Sams, PA-C  ? ?

## 2021-12-16 ENCOUNTER — Telehealth: Payer: Self-pay | Admitting: Internal Medicine

## 2021-12-16 NOTE — Telephone Encounter (Signed)
Discussed low blood sugars with patient. Spoke with her about the low blood sugar she saw was likely an error, she reports that she has been having a difficult time getting enough blood and is wondering if she needs CGM.  ?We discussed that she handled the blood sugar reading appropriately and possibly she can reduce the frequency of glucose checks.  ? ?

## 2021-12-16 NOTE — Telephone Encounter (Signed)
Pt calling to report when she took her blood sugars this weekend hes meter Read Extremely 20 below.  Pt has taken her Blood Sugar this morning and is was 92.  ? ?Pt also reports her lips felt like 'pins and Needles" on yesterday. ? ?Pt is concerned and was scared wanted to make sure everything is okay. ?

## 2021-12-29 ENCOUNTER — Encounter: Payer: Medicare Other | Admitting: Internal Medicine

## 2021-12-29 ENCOUNTER — Encounter: Payer: Self-pay | Admitting: Internal Medicine

## 2021-12-29 ENCOUNTER — Ambulatory Visit (INDEPENDENT_AMBULATORY_CARE_PROVIDER_SITE_OTHER): Payer: Medicare Other | Admitting: Internal Medicine

## 2021-12-29 ENCOUNTER — Ambulatory Visit (INDEPENDENT_AMBULATORY_CARE_PROVIDER_SITE_OTHER): Payer: Medicare Other | Admitting: Dietician

## 2021-12-29 ENCOUNTER — Encounter: Payer: Self-pay | Admitting: Dietician

## 2021-12-29 VITALS — BP 131/70 | HR 96 | Temp 97.6°F | Ht 67.0 in | Wt 248.1 lb

## 2021-12-29 DIAGNOSIS — E1169 Type 2 diabetes mellitus with other specified complication: Secondary | ICD-10-CM

## 2021-12-29 DIAGNOSIS — E7849 Other hyperlipidemia: Secondary | ICD-10-CM

## 2021-12-29 DIAGNOSIS — M1120 Other chondrocalcinosis, unspecified site: Secondary | ICD-10-CM

## 2021-12-29 DIAGNOSIS — E66813 Obesity, class 3: Secondary | ICD-10-CM

## 2021-12-29 DIAGNOSIS — Z0000 Encounter for general adult medical examination without abnormal findings: Secondary | ICD-10-CM

## 2021-12-29 DIAGNOSIS — M1711 Unilateral primary osteoarthritis, right knee: Secondary | ICD-10-CM

## 2021-12-29 DIAGNOSIS — I10 Essential (primary) hypertension: Secondary | ICD-10-CM | POA: Diagnosis not present

## 2021-12-29 DIAGNOSIS — N3941 Urge incontinence: Secondary | ICD-10-CM | POA: Diagnosis not present

## 2021-12-29 DIAGNOSIS — M19042 Primary osteoarthritis, left hand: Secondary | ICD-10-CM

## 2021-12-29 DIAGNOSIS — F32A Depression, unspecified: Secondary | ICD-10-CM

## 2021-12-29 DIAGNOSIS — M19041 Primary osteoarthritis, right hand: Secondary | ICD-10-CM

## 2021-12-29 DIAGNOSIS — E119 Type 2 diabetes mellitus without complications: Secondary | ICD-10-CM

## 2021-12-29 MED ORDER — MIRABEGRON ER 25 MG PO TB24: 25.0000 mg | ORAL_TABLET | Freq: Every day | ORAL | 1 refills | Status: DC

## 2021-12-29 MED ORDER — ONETOUCH VERIO VI STRP: ORAL_STRIP | 3 refills | Status: DC

## 2021-12-29 NOTE — Progress Notes (Signed)
? ?  Subjective:  ? ? Patient ID: Christine [name], female    DOB: Feb 10, 1936, 86 y.o.   MRN: [id number] ? ?HPI ? ?I have seen and examined this patient.  Patient is here for routine follow-up of her hypertension and diabetes. ? ?Patient does state that she has recurrence of her urinary incontinence symptoms.  States that she gets sudden urge to use the restroom and sometimes does not make it in time.  She states that her knee pain is better since getting an injection in her knee.  Denies any other complaints at this time and states that she is compliant with all her medications. ? ?Review of Systems  ?Constitutional: Negative.   ?HENT: Negative.    ?Respiratory: Negative.    ?Cardiovascular: Negative.   ?Gastrointestinal: Negative.   ?Genitourinary:   ?     Urge incontinence  ?Musculoskeletal:  Positive for arthralgias.  ?Neurological: Negative.   ?Psychiatric/Behavioral: Negative.    ? ?   ?Objective:  ? Physical Exam ?Constitutional:   ?   Appearance: Normal appearance.  ?HENT:  ?   Head: Normocephalic and atraumatic.  ?   Mouth/Throat:  ?   Mouth: Mucous membranes are moist.  ?   Pharynx: Oropharynx is clear.  ?Cardiovascular:  ?   Rate and Rhythm: Normal rate and regular rhythm.  ?   Heart sounds: Normal heart sounds.  ?Pulmonary:  ?   Effort: No respiratory distress.  ?   Breath sounds: Normal breath sounds. No wheezing.  ?Abdominal:  ?   General: Bowel sounds are normal. There is no distension.  ?   Palpations: Abdomen is soft.  ?   Tenderness: There is no abdominal tenderness.  ?Musculoskeletal:     ?   General: No swelling or tenderness.  ?   Cervical back: Neck supple. No rigidity.  ?Neurological:  ?   General: No focal deficit present.  ?   Mental Status: She is alert and oriented to person, place, and time.  ?Psychiatric:     ?   Mood and Affect: Mood normal.     ?   Behavior: Behavior normal.  ? ? ? ? ? ?   ?Assessment & Plan:  ?Please see problem based charting for assessment and plan: ? ?

## 2021-12-29 NOTE — Assessment & Plan Note (Addendum)
-   This problem is chronic and stable ?-We will check a lipid panel today ?-Continue the atorvastatin 40 mg daily for now ? ?Addendum: ?-Patient's lipid panel showed an elevated total cholesterol of 268 and an elevated LDL of 189.  Patient states that she has only been taking atorvastatin intermittently.  Her LDL from 2 years ago was 109.  She was taking her statin consistently at that time ?-We will call in a refill for her atorvastatin 40 mg daily and then recheck a lipid panel later this year ?-Case discussed with the patient via the phone and she expressed understanding and is in agreement with plan. ?

## 2021-12-29 NOTE — Assessment & Plan Note (Signed)
-   This problem is chronic and stable ?-Rheumatology follow-up appreciated ?-Patient is supposed to be on colchicine daily for her pseudogout but takes this only as needed.  Patient states that this regimen works for her and has been controlling her pain ?-Patient also uses Voltaren gel as needed to help with the pain ?-No further work-up at this time ?

## 2021-12-29 NOTE — Assessment & Plan Note (Signed)
-   This problem is chronic and stable ?-Patient's weight has worsened (up 12 pounds since her last visit) ?-Patient states that she is continue to follow her diet and exercise regimen ?-She will follow-up with [name] today to help with her weight management ?-No further work-up at this time ?

## 2021-12-29 NOTE — Assessment & Plan Note (Addendum)
BP Readings from Last 3 Encounters:  ?12/29/21 131/70  ?09/15/21 (!) 143/73  ?09/14/21 (!) 149/78  ? ? ?Lab Results  ?Component Value Date  ? NA 137 09/14/2021  ? K 4.6 09/14/2021  ? CREATININE 0.96 (H) 09/14/2021  ? ? ?Assessment: ?Blood pressure control:  Well-controlled ?Progress toward BP goal:   At goal ?Comments: Patient is compliant with spironolactone 25 mg daily as well as amlodipine 5 mg daily ? ?Plan: ?Medications:  continue current medications ?Educational resources provided:   ?Actor tools provided:   ?Other plans: We will check BMP today ? ?Addendum: ?-Patient BMP was within normal limits except for mildly elevated blood glucose of 101. ?-No further work-up at this time ?-Patient expressed understanding and is in agreement with plan. ?

## 2021-12-29 NOTE — Patient Instructions (Signed)
-It was a pleasure seeing you today ?-We will check some blood work on you today as well as a urine test ?-I have put in a medication for you at the pharmacy for your urinary incontinence.  Please call me if you have any issues with the cost of the medication or obtaining the medication ?-Please get your shingles vaccine from your pharmacy ?-Please schedule your eye exam ?-Your blood pressure is well controlled.  Keep up the good work ?-Your weight has increased since her last visit.  Please continue to follow your diet and exercise plan ?-Please call me if you have any questions or concerns or if you need any refills ? ?Mirabegron Extended-Release Oral Granules for Suspension ?What is this medication? ?MIRABEGRON (MIR a BEG ron) is used to treat overactive bladder. This medicine reduces the amount of bathroom visits. It may also help to control wetting accidents. ?This medicine may be used for other purposes; ask your health care provider or pharmacist if you have questions. ?COMMON BRAND NAME(S): Myrbetriq ?What should I tell my care team before I take this medication? ?They need to know if you have any of these conditions: ?high blood pressure ?kidney disease ?liver disease ?trouble passing urine ?an unusual or allergic reaction to mirabegron, other medicines, foods, dyes, or preservatives ?pregnant or trying to get pregnant ?breast-feeding ?How should I use this medication? ?Take this medicine by mouth. Take it as directed on the prescription label at the same time every day. Shake well before using. Use a specially marked oral syringe, spoon, or dropper to measure each dose. Ask your pharmacist if you do not have one. Household spoons are not accurate. Take it with food. Keep taking it unless your health care provider tells you to stop. ?Talk to your health care provider about the use of this medicine in children. While it may be prescribed for children as young as 3 years for selected conditions, precautions  do apply. ?Overdosage: If you think you have taken too much of this medicine contact a poison control center or emergency room at once. ?NOTE: This medicine is only for you. Do not share this medicine with others. ?What if I miss a dose? ?If you miss a dose, take it as soon as you can unless it is more than 12 hours late. If it is more than 12 hours late, skip the missed dose. Take the next dose at the normal time. ?What may interact with this medication? ?codeine ?desipramine ?digoxin ?flecainide ?MAOIs like Carbex, Eldepryl, Marplan, Nardil, and Parnate ?methadone ?metoprolol ?pimozide ?propafenone ?thioridazine ?warfarin ?This list may not describe all possible interactions. Give your health care provider a list of all the medicines, herbs, non-prescription drugs, or dietary supplements you use. Also tell them if you smoke, drink alcohol, or use illegal drugs. Some items may interact with your medicine. ?What should I watch for while using this medication? ?Visit your health care provider for regular checks on your progress. Tell your health care provider if your symptoms do not start to get better or if they get worse. Check your blood pressure as directed. Ask your doctor or health care professional what your blood pressure should be and when you should contact him or her. ?What side effects may I notice from receiving this medication? ?Side effects that you should report to your doctor or health care professional as soon as possible: ?allergic reactions (skin rash, itching or hives; swelling of the face, lips, or tongue) ?increase in blood pressure ?fast, irregular heartbeat ?  infection (fever, chills, pain or trouble passing urine) ?trouble passing urine ?Side effects that usually do not require medical attention (report these to your doctor or health care professional if they continue or are bothersome): ?constipation ?dry mouth ?headache ?nausea ?sore throat ?This list may not describe all possible side  effects. Call your doctor for medical advice about side effects. You may report side effects to FDA at 1-800-FDA-1088. ?Where should I keep my medication? ?Keep out of the reach of children and pets. ?Store at room temperature between 20 and 25 degrees C (68 and 77 degrees F). Get rid of any unused medicine after 28 days. ?To get rid of medicines that are no longer needed or have expired: ?Take the medicine to a medicine take-back program. Check with your pharmacy or law enforcement to find a location. ?If you cannot return the medicine, check the label or package insert to see if the medicine should be thrown out in the garbage or flushed down the toilet. If you are not sure, ask your health care provider. If it is safe to put it in the trash, pour the medicine out of the container. Mix the medicine with cat litter, dirt, coffee grounds, or other unwanted substance. Seal the mixture in a bag or container. Put it in the trash. ?NOTE: This sheet is a summary. It may not cover all possible information. If you have questions about this medicine, talk to your doctor, pharmacist, or health care provider. ?? 2022 Elsevier/Gold Standard (2020-06-27 00:00:00) ? ?

## 2021-12-29 NOTE — Assessment & Plan Note (Addendum)
>>  ASSESSMENT AND PLAN FOR OSTEOARTHRITIS WRITTEN ON 06/01/2023  8:55 AM BY Raygen Linquist, MD  >>ASSESSMENT AND PLAN FOR OSTEOARTHRITIS OF RIGHT KNEE WRITTEN ON 12/29/2021 10:04 AM BY NARENDRA, NISCHAL, MD  - This problem is chronic and stable -Patient did have Orthovisc injected into her knee at her last rheumatology appointment in February. -Patient states that since the injection her pain is much improved -We discussed that these are all temporizing measures and that eventually she may end up needing a knee replacement given her severe osteoarthritis -Patient will continue to use Voltaren gel as needed for her knee pain -No further work-up at this time.  We will continue to monitor closely -If patient's pain worsens we will refer back to orthopedics for discussion of risks and benefits of knee replacement  >>ASSESSMENT AND PLAN FOR PRIMARY OSTEOARTHRITIS OF BOTH HANDS WRITTEN ON 12/29/2021 10:06 AM BY NARENDRA, NISCHAL, MD  - This problem is chronic and stable -Patient has pain in her PIP and DIP joints of her fingers bilaterally -She has been using Voltaren gel for this and this has helped with her pain significantly -We will continue Voltaren gel as needed for now -No further work-up at this time

## 2021-12-29 NOTE — Assessment & Plan Note (Signed)
-   Patient instructed to get her shingles vaccine at the pharmacy ?-She will also schedule her annual eye exam due to her history of diabetes and hypertension ?

## 2021-12-29 NOTE — Progress Notes (Signed)
Medical Nutrition Therapy :  ?Start time: 1000 am  End time:  1033 am  ?Total time:33 Minutes ?Visit # 6 ? ?  ?Assessment:  Primary concerns today: blood sugar lancing device, glycemic and weight control. Diabetes diagnosis was June 2021. [name]  is here with her daughter, Judson Roch today. She states "gained a few pounds" and knows what she needs to do to stop weight gain.  ?She brought her meter that shows an average for the past 30 days of 100 mg/dl   ?She continues her physical activity, eating healthy and smaller portions , checking blood sugar a few times a week and weighing herself.    ? ?She is doing well overall.  ?  ?ANTHROPOMETRICS:Estimated body mass index is 38.86 kg/m? as calculated from the following: ?  Height as of an earlier encounter on 12/29/21: _0  (1.702 m). ?  Weight as of an earlier encounter on 12/29/21: 248 lb 1.6 oz (112.5 kg). ?  ?Lab Results  ?Component Value Date  ? HGBA1C 5.8 (A) 09/15/2021  ? HGBA1C 5.7 (A) 06/09/2021  ? HGBA1C 5.6 01/27/2021  ? HGBA1C 6.5 (A) 07/17/2020  ? HGBA1C 7.1 (A) 03/04/2020  ?  ?Wt Readings from Last 10 Encounters:  ?12/29/21 248 lb 1.6 oz (112.5 kg)  ?09/15/21 236 lb 12.8 oz (107.4 kg)  ?09/14/21 239 lb 6.4 oz (108.6 kg)  ?06/09/21 233 lb 12.8 oz (106.1 kg)  ?05/07/21 227 lb (103 kg)  ?04/28/21 227 lb 6.4 oz (103.1 kg)  ?03/16/21 232 lb 9.6 oz (105.5 kg)  ?03/03/21 236 lb 4.8 oz (107.2 kg)  ?01/27/21 237 lb (107.5 kg)  ?09/12/20 262 lb (118.8 kg)  ? ? ?DIETARY INTAKE: ?Usual eating pattern includes 3 meals and 0-1 snacks per day. ?Breakfast-  peas and buttermilk or  1 boiled eggs or slice wheat bread  ?Lunch- boiled chicken, black eyed peas and pintos or ribeye steak seldom, cornbread ?Fruit- canned lite ?Beverages- water, coffee, milk, buttermilk, elderberry ?. Her family is supportive and brings her fruits and healthy foods  ?Usual physical activity: sit and be fit -4 for 30 minutes ?                                   Walks 30 minutes 6 days a week. ?  ?   ?Progress Towards Goal(s):  Some progress. ?             ?Nutritional Diagnosis:  ?NB-1.1 Food and nutrition-related knowledge deficit As related to lack of sufficient prior diabetes and meal planning education shows continued improvement As evidenced by her report, blood sugars and A1C.  ?              ?Intervention:  Nutrition education and support about taking care of her diabetes, .  ?Action Goals: shaking hand below heart to help blood get to end of her fingers,  and continue to  weigh, eat healthy and do her Activity  ? Outcome goal: lower blood sugars has been met- now in maintenance stage  ?Coordination of care:none ? ?Teaching Method Utilized:  Auditory ?Handouts given during visit include: After visit summary ?Barriers to learning/adherence to lifestyle change: anxiety  ?Demonstrated degree of understanding via:  Teach Back  ?  ?Monitoring/Evaluation:  Dietary intake, exercise, meter, and body weight in 6 month(s). ?[name], RD ?12/29/2021 ?2:21 PM. ? ?  ?  ?  ?  ?  ?   ?  ? ? ? ? ? ?

## 2021-12-29 NOTE — Patient Instructions (Signed)
Hi Eliane, ? ? It was a pleasure seeing you today. ? ?Keep up the good work of caring for you and your diabetes! ? ?Call me anytime.  ? ?You can get another lancing device every 6 months.  ? ?See you around September 2023. ? ?[name] ?[phone] ? ?

## 2021-12-29 NOTE — Assessment & Plan Note (Signed)
-   This was chronic and stable ?-Patient has had intermittent episodes of depression since her son passed away ?-We will check a PHQ 9 score today ?-We will continue to monitor off medications ?

## 2021-12-29 NOTE — Assessment & Plan Note (Signed)
-   This problem is chronic and stable ?-Patient has pain in her PIP and DIP joints of her fingers bilaterally ?-She has been using Voltaren gel for this and this has helped with her pain significantly ?-We will continue Voltaren gel as needed for now ?-No further work-up at this time ?

## 2021-12-29 NOTE — Assessment & Plan Note (Signed)
-   This problem is chronic and stable ?-Patient did have Orthovisc injected into her knee at her last rheumatology appointment in February. ?-Patient states that since the injection her pain is much improved ?-We discussed that these are all temporizing measures and that eventually she may end up needing a knee replacement given her severe osteoarthritis ?-Patient will continue to use Voltaren gel as needed for her knee pain ?-No further work-up at this time.  We will continue to monitor closely ?-If patient's pain worsens we will refer back to orthopedics for discussion of risks and benefits of knee replacement ?

## 2021-12-29 NOTE — Assessment & Plan Note (Signed)
Lab Results  ?Component Value Date  ? HGBA1C 5.8 (A) 09/15/2021  ? HGBA1C 5.7 (A) 06/09/2021  ? HGBA1C 5.6 01/27/2021  ?  ? ?Assessment: ?Diabetes control:  Well-controlled ?Progress toward A1C goal:   At goal ?Comments: Patient is off all her diabetic medications currently.  We will check a repeat A1c today. ? ?Plan: ?Medications:  Not on any medications at this time ?Home glucose monitoring: ?Frequency:   ?Timing:   ?Instruction/counseling given: reminded to get eye exam, discussed the need for weight loss and discussed diet ?Educational resources provided:   ?Actor tools provided:   ?Other plans: Patient's meter was downloaded and her average blood sugar was 100 mg/dL with almost all of her blood sugars within target range.  We will check BMP today ? ? ?

## 2021-12-29 NOTE — Assessment & Plan Note (Signed)
-   Patient complains of recurrence of her urinary incontinence symptoms ?-Patient with sudden urge to use restroom and sometimes does not make it on time leading to urinary incontinence ?-She has seen neurology in the past for this (Dr. Gloriann Loan) and was started on tolterodine for this but had stopped taking this medication as she felt like her symptoms had improved ?-We will check a UA to rule out urinary tract infection ?-We will start the patient on Myrbetriq to help with her urinary incontinence ?-No further work-up at this time ?

## 2021-12-30 LAB — BMP8+ANION GAP
Anion Gap: 16 mmol/L (ref 10.0–18.0)
BUN/Creatinine Ratio: 31 — ABNORMAL HIGH (ref 12–28)
BUN: 27 mg/dL (ref 8–27)
CO2: 25 mmol/L (ref 20–29)
Calcium: 9.9 mg/dL (ref 8.7–10.3)
Chloride: 97 mmol/L (ref 96–106)
Creatinine, Ser: 0.87 mg/dL (ref 0.57–1.00)
Glucose: 101 mg/dL — ABNORMAL HIGH (ref 70–99)
Potassium: 4.4 mmol/L (ref 3.5–5.2)
Sodium: 138 mmol/L (ref 134–144)
eGFR: 66 mL/min/{1.73_m2} (ref >59)

## 2021-12-30 LAB — LIPID PANEL
Chol/HDL Ratio: 4.1 ratio (ref 0.0–4.4)
Cholesterol, Total: 268 mg/dL — ABNORMAL HIGH (ref 100–199)
HDL: 66 mg/dL (ref >39)
LDL Chol Calc (NIH): 189 mg/dL — ABNORMAL HIGH (ref 0–99)
Triglycerides: 79 mg/dL (ref 0–149)
VLDL Cholesterol Cal: 13 mg/dL (ref 5–40)

## 2021-12-30 MED ORDER — ATORVASTATIN CALCIUM 40 MG PO TABS: 40.0000 mg | ORAL_TABLET | Freq: Every day | ORAL | 3 refills | Status: DC

## 2021-12-30 NOTE — Addendum Note (Signed)
Addended by: Aldine Contes on: 12/30/2021 02:12 PM ? ? Modules accepted: Orders ? ?

## 2021-12-31 LAB — URINALYSIS, COMPLETE
Bilirubin, UA: NEGATIVE
Glucose, UA: NEGATIVE
Ketones, UA: NEGATIVE
Leukocytes,UA: NEGATIVE
Nitrite, UA: NEGATIVE
Protein,UA: NEGATIVE
RBC, UA: NEGATIVE
Specific Gravity, UA: 1.008 (ref 1.005–1.030)
Urobilinogen, Ur: 0.2 mg/dL (ref 0.2–1.0)
pH, UA: 6.5 (ref 5.0–7.5)

## 2021-12-31 LAB — MICROSCOPIC EXAMINATION
Bacteria, UA: NONE SEEN
Casts: NONE SEEN /lpf
Epithelial Cells (non renal): NONE SEEN /hpf (ref 0–10)
RBC, Urine: NONE SEEN /hpf (ref 0–2)
WBC, UA: NONE SEEN /hpf (ref 0–5)

## 2022-01-07 ENCOUNTER — Other Ambulatory Visit: Payer: Self-pay | Admitting: Dietician

## 2022-01-07 DIAGNOSIS — E1169 Type 2 diabetes mellitus with other specified complication: Secondary | ICD-10-CM

## 2022-01-07 MED ORDER — ONETOUCH DELICA PLUS LANCING MISC: 1 refills | Status: DC

## 2022-01-07 NOTE — Telephone Encounter (Signed)
Christine [name] asks for a prescription for a new lancing device.  ?

## 2022-01-08 NOTE — Telephone Encounter (Signed)
Christine [name] called and informed where to pick it up.  ?

## 2022-01-28 ENCOUNTER — Encounter: Payer: Self-pay | Admitting: Student

## 2022-01-28 ENCOUNTER — Ambulatory Visit (INDEPENDENT_AMBULATORY_CARE_PROVIDER_SITE_OTHER): Payer: Medicare Other | Admitting: Student

## 2022-01-28 VITALS — BP 141/64 | HR 87 | Wt 248.2 lb

## 2022-01-28 DIAGNOSIS — I10 Essential (primary) hypertension: Secondary | ICD-10-CM

## 2022-01-28 DIAGNOSIS — E7849 Other hyperlipidemia: Secondary | ICD-10-CM | POA: Diagnosis not present

## 2022-01-28 DIAGNOSIS — N3941 Urge incontinence: Secondary | ICD-10-CM

## 2022-01-28 MED ORDER — MIRABEGRON ER 50 MG PO TB24: 50.0000 mg | ORAL_TABLET | Freq: Every day | ORAL | 4 refills | Status: DC

## 2022-01-28 NOTE — Patient Instructions (Signed)
Ms. Tippins, ? ?It was nice seeing you in the clinic today.  I am glad you are doing well. ? ?1.  I increase the Myrbetriq to 50 mg daily.  Hopefully this will help with your urinary leakage. ? ?2.  Please continue atorvastatin for your cholesterol.  I will check your liver function today. ? ?3.  Please continue your blood pressure medications. ? ?Please return in 3 months ? ?Take care ? ?Dr. Alfonse Spruce ?

## 2022-01-28 NOTE — Progress Notes (Signed)
? ?  CC: F/u on urinary incontinence ? ?HPI: ? ?Christine [name] is a 86 y.o. with past medical history of hypertension, hyperlipidemia, urge urinary incontinence who presents to the clinic today to follow-up on response of Myrbetriq. ? ?Please see problem base charting for detail ? ?Past Medical History:  ?Diagnosis Date  ? Allergy   ? Arthritis   ? Cataract   ? Depression   ? Diabetes ([place]) 09/23/2014  ? GERD (gastroesophageal reflux disease)   ? per patient   ? Grief reaction 10/17/2018  ? Hip pain, acute, right 01/30/2018  ? Hyperlipemia   ? Hypertension   ? Impaired glucose tolerance   ? Morbidly obese ([place])   ? Pruritus   ? Seasonal allergies   ? Thyroid nodule   ? Unintended weight loss 01/27/2021  ? Unintended weight loss 01/27/2021  ? ?Review of Systems:  per HPI ? ?Physical Exam: ? ?Vitals:  ? 01/28/22 0852  ?BP: (!) 141/64  ?Pulse: 87  ?SpO2: 100%  ?Weight: 248 lb 3.2 oz (112.6 kg)  ? ?Physical Exam ?Constitutional:   ?   General: She is not in acute distress. ?   Appearance: She is not ill-appearing.  ?HENT:  ?   Head: Normocephalic.  ?Eyes:  ?   General:     ?   Right eye: No discharge.     ?   Left eye: No discharge.  ?Cardiovascular:  ?   Rate and Rhythm: Normal rate and regular rhythm.  ?Pulmonary:  ?   Effort: Pulmonary effort is normal. No respiratory distress.  ?   Breath sounds: Normal breath sounds. No wheezing.  ?Skin: ?   General: Skin is warm.  ?Neurological:  ?   General: No focal deficit present.  ?   Mental Status: She is alert and oriented to person, place, and time.  ?Psychiatric:     ?   Mood and Affect: Mood normal.  ?  ? ?Assessment & Plan:  ? ?See Encounters Tab for problem based charting. ? ?Essential hypertension ?Blood pressure within acceptable range 140/65 ? ?-Continue amlodipine 5 mg and spironolactone 25 mg ?-BMP 3 weeks ago was unremarkable ? ?Hyperlipidemia ?Patient was started on atorvastatin for primary prevention.  Last LDL was elevated at 189.  Given her excellent  health, good life expectancy and very elevated LDL, patient will benefit from cholesterol control. ? ?Patient has picked up atorvastatin and been taking it.  Denies any side effects. ? ?-Check LFT today ?-Repeat lipid panel at next visit ? ?Urinary incontinence ?Patient endorses significant improvement with Myrbetriq.  Before she would have many accidents a day.  Now she only has about 2 accident a day.  States that teas or carbonated drinks exacerbates her symptoms.  Patient denies any side effects from Myrbetriq. ? ?UA from last visit did not indicate UTI. ? ?-Increase Myrbetriq to 50 mg  ? ?Patient discussed with Dr. Dareen Piano  ?

## 2022-01-28 NOTE — Assessment & Plan Note (Signed)
Patient endorses significant improvement with Myrbetriq.  Before she would have many accidents a day.  Now she only has about 2 accident a day.  States that teas or carbonated drinks exacerbates her symptoms.  Patient denies any side effects from Myrbetriq. ? ?UA from last visit did not indicate UTI. ? ?-Increase Myrbetriq to 50 mg ?

## 2022-01-28 NOTE — Assessment & Plan Note (Signed)
Blood pressure within acceptable range 140/65 ? ?-Continue amlodipine 5 mg and spironolactone 25 mg ?-BMP 3 weeks ago was unremarkable ?

## 2022-01-28 NOTE — Assessment & Plan Note (Addendum)
Patient was started on atorvastatin for primary prevention.  Last LDL was elevated at 189.  Given her excellent health, good life expectancy and very elevated LDL, patient will benefit from cholesterol control. ? ?Patient has picked up atorvastatin and been taking it.  Denies any side effects. ? ?-Check LFT today ?-Repeat lipid panel at next visit ?

## 2022-01-29 LAB — HEPATIC FUNCTION PANEL
ALT: 15 IU/L (ref 0–32)
AST: 22 IU/L (ref 0–40)
Albumin: 4.3 g/dL (ref 3.6–4.6)
Alkaline Phosphatase: 104 IU/L (ref 44–121)
Bilirubin Total: 0.5 mg/dL (ref 0.0–1.2)
Bilirubin, Direct: 0.14 mg/dL (ref 0.00–0.40)
Total Protein: 7.5 g/dL (ref 6.0–8.5)

## 2022-01-29 NOTE — Progress Notes (Signed)
Internal Medicine Clinic Attending  Case discussed with Dr. Nguyen  At the time of the visit.  We reviewed the resident's history and exam and pertinent patient test results.  I agree with the assessment, diagnosis, and plan of care documented in the resident's note. 

## 2022-01-29 NOTE — Addendum Note (Signed)
Addended byGaylan Gerold on: 01/29/2022 11:23 AM ? ? Modules accepted: Level of Service ? ?

## 2022-02-19 ENCOUNTER — Telehealth: Payer: Self-pay
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
## 2022-03-15 ENCOUNTER — Ambulatory Visit: Payer: Medicare Other | Admitting: Rheumatology

## 2022-03-16 ENCOUNTER — Telehealth: Payer: Self-pay | Admitting: Dietician

## 2022-03-17 ENCOUNTER — Encounter: Payer: Self-pay | Admitting: Physician Assistant

## 2022-03-17 ENCOUNTER — Ambulatory Visit (INDEPENDENT_AMBULATORY_CARE_PROVIDER_SITE_OTHER): Payer: Medicare Other | Admitting: Physician Assistant

## 2022-03-17 ENCOUNTER — Telehealth: Payer: Self-pay | Admitting: *Deleted

## 2022-03-17 VITALS — BP 169/93 | HR 96 | Resp 16 | Ht 68.2 in | Wt 242.0 lb

## 2022-03-17 DIAGNOSIS — M7061 Trochanteric bursitis, right hip: Secondary | ICD-10-CM

## 2022-03-17 DIAGNOSIS — M1711 Unilateral primary osteoarthritis, right knee: Secondary | ICD-10-CM | POA: Diagnosis not present

## 2022-03-17 DIAGNOSIS — M160 Bilateral primary osteoarthritis of hip: Secondary | ICD-10-CM

## 2022-03-17 DIAGNOSIS — M19042 Primary osteoarthritis, left hand: Secondary | ICD-10-CM

## 2022-03-17 DIAGNOSIS — M19041 Primary osteoarthritis, right hand: Secondary | ICD-10-CM

## 2022-03-17 DIAGNOSIS — Z8679 Personal history of other diseases of the circulatory system: Secondary | ICD-10-CM | POA: Diagnosis not present

## 2022-03-17 DIAGNOSIS — R7303 Prediabetes: Secondary | ICD-10-CM

## 2022-03-17 DIAGNOSIS — L409 Psoriasis, unspecified: Secondary | ICD-10-CM | POA: Diagnosis not present

## 2022-03-17 DIAGNOSIS — M1120 Other chondrocalcinosis, unspecified site: Secondary | ICD-10-CM | POA: Diagnosis not present

## 2022-03-17 DIAGNOSIS — Z8639 Personal history of other endocrine, nutritional and metabolic disease: Secondary | ICD-10-CM | POA: Diagnosis not present

## 2022-03-17 DIAGNOSIS — Z8719 Personal history of other diseases of the digestive system: Secondary | ICD-10-CM | POA: Diagnosis not present

## 2022-03-17 MED ORDER — DICLOFENAC SODIUM 1 % EX GEL: CUTANEOUS | 4 refills | Status: DC

## 2022-03-17 MED ORDER — COLCHICINE 0.6 MG PO TABS: 0.6000 mg | ORAL_TABLET | Freq: Every day | ORAL | 0 refills | Status: DC

## 2022-03-17 NOTE — Telephone Encounter (Signed)
Per Hazel Sams, PA-C. Apply for Visco Right knee due end of August/September 2023. Thanks!

## 2022-03-17 NOTE — Telephone Encounter (Signed)
Told patient where she can buy a battery for her meter and that she can call her pharmacy where she has a prescription for a new lancing device. Hers is broken again. Asked her to call me back with questions or concerns.

## 2022-03-18 ENCOUNTER — Other Ambulatory Visit: Payer: Self-pay | Admitting: Dietician

## 2022-03-18 ENCOUNTER — Ambulatory Visit: Payer: Medicare Other | Admitting: Rheumatology

## 2022-03-18 ENCOUNTER — Ambulatory Visit: Payer: Medicare Other | Admitting: Physician Assistant

## 2022-03-18 MED ORDER — ONETOUCH VERIO REFLECT W/DEVICE KIT: PACK | 1 refills | Status: DC

## 2022-03-18 NOTE — Telephone Encounter (Signed)
She says she went to her pharmacy and they told her she needs a new prescription for a meter.

## 2022-05-04 ENCOUNTER — Ambulatory Visit (INDEPENDENT_AMBULATORY_CARE_PROVIDER_SITE_OTHER): Payer: Medicare Other | Admitting: Internal Medicine

## 2022-05-04 VITALS — BP 146/66 | HR 84 | Temp 98.7°F | Ht 68.2 in | Wt 244.6 lb

## 2022-05-04 DIAGNOSIS — R110 Nausea: Secondary | ICD-10-CM | POA: Diagnosis not present

## 2022-05-04 DIAGNOSIS — I10 Essential (primary) hypertension: Secondary | ICD-10-CM | POA: Diagnosis not present

## 2022-05-04 DIAGNOSIS — Z0000 Encounter for general adult medical examination without abnormal findings: Secondary | ICD-10-CM

## 2022-05-04 DIAGNOSIS — Z87891 Personal history of nicotine dependence: Secondary | ICD-10-CM | POA: Diagnosis not present

## 2022-05-04 DIAGNOSIS — E7849 Other hyperlipidemia: Secondary | ICD-10-CM

## 2022-05-04 DIAGNOSIS — M1711 Unilateral primary osteoarthritis, right knee: Secondary | ICD-10-CM | POA: Diagnosis not present

## 2022-05-04 DIAGNOSIS — F32A Depression, unspecified: Secondary | ICD-10-CM

## 2022-05-04 DIAGNOSIS — N3941 Urge incontinence: Secondary | ICD-10-CM

## 2022-05-04 DIAGNOSIS — M1120 Other chondrocalcinosis, unspecified site: Secondary | ICD-10-CM

## 2022-05-04 DIAGNOSIS — E669 Obesity, unspecified: Secondary | ICD-10-CM

## 2022-05-04 DIAGNOSIS — R32 Unspecified urinary incontinence: Secondary | ICD-10-CM | POA: Diagnosis not present

## 2022-05-04 DIAGNOSIS — E119 Type 2 diabetes mellitus without complications: Secondary | ICD-10-CM

## 2022-05-04 LAB — POCT GLYCOSYLATED HEMOGLOBIN (HGB A1C): Hemoglobin A1C: 5.7 % — AB (ref 4.0–5.6)

## 2022-05-04 LAB — GLUCOSE, CAPILLARY: Glucose-Capillary: 90 mg/dL (ref 70–99)

## 2022-05-04 NOTE — Assessment & Plan Note (Signed)
Patient has chronic urinary incontinence that is improving. Patient reports episodes of both urge incontinence as well as stress incontinence during the day. Patient's Myrbetriq was increased to '50mg'$  three months ago and patient reports that urge incontinence has improved during the day. However, patient still endorses frequent nighttime awakenings and occasional episodes of enuresis. Patient has done pelvic floor PT years ago and said she found it helpful.  A/P: Patient has mixed urinary incontinence that is well-controlled during the day and less controlled at night. We recommended to patient to take Myrbetriq at night and see if that improves nighttime symptoms.  -Continue Myrbetriq '50mg'$ , take at night -Provided patient with pelvic floor PT exercises for stress incontinence

## 2022-05-04 NOTE — Telephone Encounter (Signed)
Please call to schedule visco injections.  Approved for Orthovisc, right knee Buy & Bill Covered at 100% after Medicaid pays No co-pay Deductible does not apply No pre-certifications
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
## 2022-05-04 NOTE — Assessment & Plan Note (Signed)
>>  ASSESSMENT AND PLAN FOR OSTEOARTHRITIS OF RIGHT KNEE WRITTEN ON 05/04/2022  5:01 PM BY Charolett Bumpers, MEDICAL STUDENT  Patient has chronic stable osteoarthritis. Patient reports that right knee feels more stiff and she has more pain with ambulation and occasionally uses a walker for support. Patient denies falls or feeling weakness while walking. Patient reports that Voltaren gel is still working well for her for pain. Patient expressed hesitancy towards knee replacement surgery and is still unsure if she wants to proceed.   A/P: -We discussed how the risks of surgery increase with age and encouraged her to be proactive in making a decision. Patient plans to reach out to orthopedics directly.  -Patient has follow-up appointment with Rheumatology in December.

## 2022-05-04 NOTE — Patient Instructions (Addendum)
Thank you for seeing [place] today!  Try doing kegel exercises for urinary incontinence.  We recommend that you meet with your orthopedic surgeon to discuss the risks and benefits of knee replacement. You are due for your annual eye exam, please schedule with your ophthalmologist. Your blood pressure in office was a little high today (146/66) and we expect that your blood pressure will improve with healthy diet and exercise. If your blood pressure remains elevated at your next visit, we may consider modifying your medications.

## 2022-05-04 NOTE — Assessment & Plan Note (Signed)
Patient has chronic stable HLA managed with atorvastatin 105m. She reports that she is adherent to her medication and reports no side effects. Previous LFTs three months ago was unremarkable.   A/P: -Check lipid panel next visit

## 2022-05-04 NOTE — Telephone Encounter (Signed)
VOB submitted for Orthovisc, right knee BV pending

## 2022-05-04 NOTE — Assessment & Plan Note (Addendum)
Patient has chronic well-controlled diabetes managed with diet alone. Patient does take her home blood sugars but did not bring her meter today. She reports her AM fasting glucose this morning was 96.   A/P: -A1c today was 5.7 -Patient is due for eye exam and patient says she will schedule with them directly -We discussed the importance of continuing to eat a good diet and losing weight -No further workup today

## 2022-05-04 NOTE — Assessment & Plan Note (Addendum)
Patient has an acute complaint of nausea that started this morning before her appointment. She reports that she ate less breakfast than usual and that she has not taken her omeprazole. The nausea is not associated with abdominal pain or discomfort. Abdominal exam unremarkable. Patient reports feeling better after having some juice and crackers in clinic. Patient advised to take her blood sugar if she feels nausea in the future to see if it is associated with blood sugar lows.

## 2022-05-04 NOTE — Assessment & Plan Note (Addendum)
Patient has chronic stable osteoarthritis. Patient reports that right knee feels more stiff and she has more pain with ambulation and occasionally uses a walker for support. Patient denies falls or feeling weakness while walking. Patient reports that Voltaren gel is still working well for her for pain. Patient expressed hesitancy towards knee replacement surgery and is still unsure if she wants to proceed.   A/P: -We discussed how the risks of surgery increase with age and encouraged her to be proactive in making a decision. Patient plans to reach out to orthopedics directly.  -Patient has follow-up appointment with Rheumatology in December.

## 2022-05-05 NOTE — Assessment & Plan Note (Signed)
-  Patient encouraged to get her shingles vaccine -She still has to schedule her eye exam

## 2022-05-05 NOTE — Assessment & Plan Note (Signed)
-   Patient has a history of pseudogout for which she follows with pathology. -She did see them recently and they advised her to continue taking colchicine daily which patient has been compliant with -Patient also scheduled for repeat viscous knee injection in August or September for persistent knee pain -We will continue Voltaren gel for pain control -No further work-up at this time

## 2022-05-05 NOTE — Assessment & Plan Note (Signed)
-   This problem is chronic and stable -Patient's weight is stable from her last visit -Patient states that she has not been following her diet as regularly as she wants to.  We discussed the importance of continuing with the diet and excise regimen -We will follow-up weight at next visit -No further work-up at this time

## 2022-05-05 NOTE — Assessment & Plan Note (Signed)
-   This problem is chronic and stable -Patient's PHQ-9 score today was 0 -We will continue to monitor off medications -No further work-up at this time

## 2022-05-05 NOTE — Progress Notes (Signed)
Attestation for Student Documentation:  I personally was present and performed or re-performed the history, physical exam and medical decision-making activities of this service and have verified that the service and findings are accurately documented in the student's note.  Aldine Contes, MD 05/05/2022, 4:41 PM

## 2022-05-05 NOTE — Assessment & Plan Note (Signed)
BP Readings from Last 3 Encounters:  05/04/22 (!) 146/66  03/17/22 (!) 169/93  01/28/22 (!) 141/64    Lab Results  Component Value Date   NA 138 12/29/2021   K 4.4 12/29/2021   CREATININE 0.87 12/29/2021    Assessment: Blood pressure control:  fair Progress toward BP goal:   improved Comments: Patient is compliant with spironolactone 25 mg daily and amlodipine 5 mg daily.  Plan: Medications:  continue current medications Educational resources provided:   Self management tools provided:   Other plans: If patient blood pressure remains high at follow-up visit would consider restarting her HCTZ and possibly stopping her spironolactone.

## 2022-06-02 ENCOUNTER — Telehealth: Payer: Self-pay | Admitting: *Deleted

## 2022-06-02 NOTE — Telephone Encounter (Signed)
Call from pt who stated her BS was 208 this morning and it has not been this high and she's concern. She has not eaten anything; only had a few sips of coffee with sweet n low. States she drinks 40 -60 oz of water daily and has been watching her diet. States she "feels fine". Hx of diabetes; she's not on any diabetes medication.

## 2022-06-02 NOTE — Telephone Encounter (Signed)
Pt called and informed of Dr Wilber Bihari response. Stated feels better -she was worried. Stated she checks her BS's daily - informed to call the office if BS's continues to be in the 200's.

## 2022-06-02 NOTE — Telephone Encounter (Signed)
Reached out to patient to schedule Orthovisc knee injections. Patient states she would have to find her calender and call back to schedule.

## 2022-06-14 DIAGNOSIS — Z23 Encounter for immunization: Secondary | ICD-10-CM | POA: Diagnosis not present

## 2022-06-17 ENCOUNTER — Other Ambulatory Visit: Payer: Self-pay | Admitting: Internal Medicine

## 2022-06-17 DIAGNOSIS — J309 Allergic rhinitis, unspecified: Secondary | ICD-10-CM

## 2022-07-27 DIAGNOSIS — Z23 Encounter for immunization: Secondary | ICD-10-CM | POA: Diagnosis not present

## 2022-08-03 ENCOUNTER — Ambulatory Visit (INDEPENDENT_AMBULATORY_CARE_PROVIDER_SITE_OTHER): Payer: Medicare Other | Admitting: Internal Medicine

## 2022-08-03 VITALS — BP 139/69 | HR 82 | Temp 97.5°F | Ht 68.2 in | Wt 241.4 lb

## 2022-08-03 DIAGNOSIS — Z87891 Personal history of nicotine dependence: Secondary | ICD-10-CM

## 2022-08-03 DIAGNOSIS — Z0000 Encounter for general adult medical examination without abnormal findings: Secondary | ICD-10-CM

## 2022-08-03 DIAGNOSIS — I10 Essential (primary) hypertension: Secondary | ICD-10-CM | POA: Diagnosis not present

## 2022-08-03 DIAGNOSIS — F32A Depression, unspecified: Secondary | ICD-10-CM

## 2022-08-03 DIAGNOSIS — N3941 Urge incontinence: Secondary | ICD-10-CM

## 2022-08-03 DIAGNOSIS — E119 Type 2 diabetes mellitus without complications: Secondary | ICD-10-CM

## 2022-08-03 DIAGNOSIS — Z6836 Body mass index (BMI) 36.0-36.9, adult: Secondary | ICD-10-CM

## 2022-08-03 DIAGNOSIS — E669 Obesity, unspecified: Secondary | ICD-10-CM

## 2022-08-03 DIAGNOSIS — M1711 Unilateral primary osteoarthritis, right knee: Secondary | ICD-10-CM

## 2022-08-03 DIAGNOSIS — M1120 Other chondrocalcinosis, unspecified site: Secondary | ICD-10-CM | POA: Diagnosis not present

## 2022-08-03 LAB — POCT GLYCOSYLATED HEMOGLOBIN (HGB A1C): Hemoglobin A1C: 5.9 % — AB (ref 4.0–5.6)

## 2022-08-03 LAB — GLUCOSE, CAPILLARY: Glucose-Capillary: 106 mg/dL — ABNORMAL HIGH (ref 70–99)

## 2022-08-03 NOTE — Assessment & Plan Note (Signed)
BP Readings from Last 3 Encounters:  08/03/22 139/69  05/04/22 (!) 146/66  03/17/22 (!) 169/93    Lab Results  Component Value Date   NA 138 12/29/2021   K 4.4 12/29/2021   CREATININE 0.87 12/29/2021    Assessment: Blood pressure control:  Well-controlled Progress toward BP goal:   At goal Comments: Patient is compliant with spironolactone 25 mg daily and amlodipine 5 mg daily  Plan: Medications:  continue current medications Educational resources provided:   Self management tools provided:   Other plans: The patient blood pressure is higher at next visit we will consider stopping her Spyro and starting her on HCTZ.

## 2022-08-03 NOTE — Assessment & Plan Note (Signed)
-  This problem is chronic and improving -Patient's weight is improved to 241 pounds from 248 pounds earlier this year -Encouraged her to continue with her diet and exercise plan -No further work-up at this time

## 2022-08-03 NOTE — Assessment & Plan Note (Signed)
Lab Results  Component Value Date   HGBA1C 5.9 (A) 08/03/2022   HGBA1C 5.7 (A) 05/04/2022   HGBA1C 5.8 (A) 09/15/2021     Assessment: Diabetes control:  Well-controlled Progress toward A1C goal:   At goal Comments: Patient is now off all medications since her significant weight loss.  She does states she had 1 episode of her blood sugars being greater than 200 but her blood sugars were all normal.  Plan: Medications:   Patient is diet controlled Home glucose monitoring: Frequency:   Timing:   Instruction/counseling given: reminded to get eye exam and discussed the need for weight loss Educational resources provided:   Self management tools provided:   Other plans: We will reach out to our diabetes educator to see if we can get her another glucose meter.  Her current one is not working well.

## 2022-08-03 NOTE — Assessment & Plan Note (Signed)
-  This problem is chronic and stable -Patient has intermittent pain in her shoulders and wrist as well as the small joints of her fingers -Patient was advised to use colchicine daily -Patient states that she uses colchicine only as needed which she has worsening pain and that this has been working well for her -We will continue with colchicine as needed -No further work-up at this time

## 2022-08-03 NOTE — Progress Notes (Signed)
   Subjective:    Patient ID: Christine [name], female    DOB: [birth year], 86 y.o.   MRN: [id number]  Diabetes  Knee Pain     I have seen and examined this patient.  Patient is here for routine follow-up of her hypertension and diabetes.  Patient states that she feels well.  She does have intermittent pain in her right knee and had a recent injection for this.  She does still complain of persistent pain.  She denies any other complaints at this time and states that she is compliant with all her medications.  Review of Systems  Constitutional: Negative.   HENT: Negative.    Respiratory: Negative.    Cardiovascular: Negative.   Gastrointestinal: Negative.   Musculoskeletal:  Positive for arthralgias.       Right knee pain  Neurological: Negative.   Psychiatric/Behavioral: Negative.         Objective:   Physical Exam Constitutional:      Appearance: Normal appearance.  HENT:     Head: Normocephalic and atraumatic.  Cardiovascular:     Rate and Rhythm: Normal rate and regular rhythm.     Heart sounds: Normal heart sounds.  Pulmonary:     Effort: No respiratory distress.     Breath sounds: Normal breath sounds. No wheezing.  Abdominal:     General: Bowel sounds are normal. There is no distension.     Palpations: Abdomen is soft.     Tenderness: There is no abdominal tenderness.  Musculoskeletal:        General: No swelling or tenderness.  Neurological:     Mental Status: She is alert.  Psychiatric:        Mood and Affect: Mood normal.        Behavior: Behavior normal.           Assessment & Plan:   Please see problem based charting for assessment and plan:

## 2022-08-03 NOTE — Assessment & Plan Note (Signed)
-  This problem is chronic and stable -Patient's PHQ-9 score is 0 today -We will continue to monitor off medications -No further work-up at this time

## 2022-08-03 NOTE — Assessment & Plan Note (Signed)
-  This problem is chronic and slowly worsening. -Patient states that she has persistent pain in her right knee and had a recent viscous knee injection without improvement in pain.  Patient states that the Voltaren gel is helping with her pain. -We discussed that she should follow-up with the orthopedic surgeon to see if a knee replacement is appropriate for her or if this is an option if she wants to pursue.  She states that she will call Dr. Ronnie Derby and make an appointment with him to discuss this further -No further work-up at this time

## 2022-08-03 NOTE — Patient Instructions (Addendum)
-  It was a pleasure seeing you today.  Have a great holiday season! -Your blood pressure and diabetes are well controlled.  Keep up the great work -Please try and get your RSV and shingles vaccines from your pharmacy -Please follow-up with orthopedic surgeon Dr. Augustin Coupe (phone: [phone]) about a possible knee replacement.  His office address is [address] in [place] weight has improved to 241 pounds.  Keep up the great work! -Please continue with your pelvic floor exercises and Myrbetriq as this will help with your urinary incontinence. -Please schedule your eye exam -Please call me if you have any questions or concerns or if you need any refills

## 2022-08-03 NOTE — Assessment & Plan Note (Signed)
-  This problem is chronic and improving -Patient states she has been compliant with Myrbetriq as well as doing her pelvic floor exercises and this has improved her urinary incontinence -Patient encouraged to continue with her exercises and medication for now -We will follow-up with this at her next visit

## 2022-08-03 NOTE — Assessment & Plan Note (Signed)
-  Patient encouraged to get her RSV and shingles vaccines from her pharmacy.  She states that she will do this

## 2022-08-03 NOTE — Assessment & Plan Note (Signed)
>>  ASSESSMENT AND PLAN FOR OSTEOARTHRITIS OF RIGHT KNEE WRITTEN ON 08/03/2022 10:32 AM BY NARENDRA, NISCHAL, MD  -This problem is chronic and slowly worsening. -Patient states that she has persistent pain in her right knee and had a recent viscous knee injection without improvement in pain.  Patient states that the Voltaren gel is helping with her pain. -We discussed that she should follow-up with the orthopedic surgeon to see if a knee replacement is appropriate for her or if this is an option if she wants to pursue.  She states that she will call Dr. Sherlean Foot and make an appointment with him to discuss this further -No further work-up at this time

## 2022-08-09 ENCOUNTER — Other Ambulatory Visit: Payer: Self-pay | Admitting: Internal Medicine

## 2022-08-10 ENCOUNTER — Other Ambulatory Visit: Payer: Self-pay | Admitting: Internal Medicine

## 2022-08-19 ENCOUNTER — Other Ambulatory Visit: Payer: Self-pay | Admitting: Internal Medicine

## 2022-08-19 DIAGNOSIS — E119 Type 2 diabetes mellitus without complications: Secondary | ICD-10-CM

## 2022-08-19 DIAGNOSIS — E1169 Type 2 diabetes mellitus with other specified complication: Secondary | ICD-10-CM

## 2022-08-19 DIAGNOSIS — I10 Essential (primary) hypertension: Secondary | ICD-10-CM

## 2022-08-25 ENCOUNTER — Encounter: Payer: Self-pay | Admitting: *Deleted

## 2022-09-06 MED ORDER — ONETOUCH DELICA PLUS LANCET33G MISC: 3 refills | Status: DC

## 2022-09-06 MED ORDER — ONETOUCH VERIO REFLECT W/DEVICE KIT: PACK | 1 refills | Status: DC

## 2022-09-06 MED ORDER — ONETOUCH VERIO VI STRP: ORAL_STRIP | 3 refills | Status: DC
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
## 2022-09-22 ENCOUNTER — Ambulatory Visit: Payer: Medicare Other | Attending: Rheumatology | Admitting: Rheumatology

## 2022-09-22 ENCOUNTER — Encounter: Payer: Self-pay | Admitting: Rheumatology

## 2022-09-22 VITALS — BP 121/75 | HR 80 | Resp 16 | Ht 68.25 in | Wt 239.6 lb

## 2022-09-22 DIAGNOSIS — M7061 Trochanteric bursitis, right hip: Secondary | ICD-10-CM

## 2022-09-22 DIAGNOSIS — M160 Bilateral primary osteoarthritis of hip: Secondary | ICD-10-CM

## 2022-09-22 DIAGNOSIS — M19041 Primary osteoarthritis, right hand: Secondary | ICD-10-CM

## 2022-09-22 DIAGNOSIS — Z8719 Personal history of other diseases of the digestive system: Secondary | ICD-10-CM | POA: Diagnosis not present

## 2022-09-22 DIAGNOSIS — R7303 Prediabetes: Secondary | ICD-10-CM

## 2022-09-22 DIAGNOSIS — M1711 Unilateral primary osteoarthritis, right knee: Secondary | ICD-10-CM

## 2022-09-22 DIAGNOSIS — L409 Psoriasis, unspecified: Secondary | ICD-10-CM | POA: Diagnosis not present

## 2022-09-22 DIAGNOSIS — M1120 Other chondrocalcinosis, unspecified site: Secondary | ICD-10-CM

## 2022-09-22 DIAGNOSIS — M19042 Primary osteoarthritis, left hand: Secondary | ICD-10-CM | POA: Diagnosis not present

## 2022-09-22 DIAGNOSIS — Z8679 Personal history of other diseases of the circulatory system: Secondary | ICD-10-CM

## 2022-09-22 DIAGNOSIS — Z8639 Personal history of other endocrine, nutritional and metabolic disease: Secondary | ICD-10-CM

## 2022-09-22 NOTE — Patient Instructions (Signed)
Exercises for Chronic Knee Pain Chronic knee pain is pain that lasts longer than 3 months. For most people with chronic knee pain, exercise and weight loss is an important part of treatment. Your health care provider may want you to focus on: Strengthening the muscles that support your knee. This can take pressure off your knee and lessen pain. Preventing knee stiffness. Maintaining or increasing how far you can move your knee. Losing weight (if this applies) to take pressure off your knee, decrease your risk for injury, and make it easier for you to exercise. Your health care provider will help you develop an exercise program that matches your needs and physical abilities. Below are simple, low-impact exercises you can do at home. Ask your health care provider or a physical therapist how often you should do your exercise program and how many times to repeat each exercise. General safety tips Follow these safety tips for exercising with chronic knee pain: Get your health care provider's approval before doing any exercises. Start slowly and stop any time an exercise causes pain. Do not exercise if your knee pain is flaring up. Warm up first. Stretching a cold muscle can cause an injury. Do 5-10 minutes of easy movement or light stretching before beginning your exercise routine. Do 5-10 minutes of low-impact activity (like walking or cycling) before starting strengthening exercises. Contact your health care provider any time you have pain during or after exercising. Exercise may cause discomfort but should not be painful. It is normal to be a little stiff or sore after exercising.  Stretching and range-of-motion exercises Front thigh stretch  Stand up straight and support your body by holding on to a chair or resting one hand on a wall. With your legs straight and close together, bend one knee to lift your heel up toward your buttocks. Using one hand for support, grab your ankle with your free  hand. Pull your foot up closer toward your buttocks to feel the stretch in front of your thigh. Hold the stretch for 30 seconds. Repeat __________ times. Complete this exercise __________ times a day. Back thigh stretch  Sit on the floor with your back straight and your legs out straight in front of you. Place the palms of your hands on the floor and slide them toward your feet as you bend at the hip. Try to touch your nose to your knees and feel the stretch in the back of your thighs. Hold for 30 seconds. Repeat __________ times. Complete this exercise __________ times a day. Calf stretch  Stand facing a wall. Place the palms of your hands flat against the wall, arms extended, and lean slightly against the wall. Get into a lunge position with one leg bent at the knee and the other leg stretched out straight behind you. Keep both feet facing the wall and increase the bend in your knee while keeping the heel of the other leg flat on the ground. You should feel the stretch in your calf. Hold for 30 seconds. Repeat __________ times. Complete this exercise __________ times a day. Strengthening exercises Straight leg lift Lie on your back with one knee bent and the other leg out straight. Slowly lift the straight leg without bending the knee. Lift until your foot is about 12 inches (30 cm) off the floor. Hold for 3-5 seconds and slowly lower your leg. Repeat __________ times. Complete this exercise __________ times a day. Single leg dip Stand between two chairs and put both hands on the   backs of the chairs for support. Extend one leg out straight with your body weight resting on the heel of the standing leg. Slowly bend your standing knee to dip your body to the level that is comfortable for you. Hold for 3-5 seconds. Repeat __________ times. Complete this exercise __________ times a day. Hamstring curls Stand straight, knees close together, facing the back of a chair. Hold on to the  back of a chair with both hands. Keep one leg straight. Bend the other knee while bringing the heel up toward the buttock until the knee is bent at a 90-degree angle (right angle). Hold for 3-5 seconds. Repeat __________ times. Complete this exercise __________ times a day. Wall squat Stand straight with your back, hips, and head against a wall. Step forward one foot at a time with your back still against the wall. Your feet should be 2 feet (61 cm) from the wall at shoulder width. Keeping your back, hips, and head against the wall, slide down the wall to as close of a sitting position as you can get. Hold for 5-10 seconds, then slowly slide back up. Repeat __________ times. Complete this exercise __________ times a day. Step-ups Step up with one foot onto a sturdy platform or stool that is about 6 inches (15 cm) high. Face sideways with one foot on the platform and one on the ground. Place all your weight on the platform foot and lift your body off the ground until your knee extends. Let your other leg hang free to the side. Hold for 3-5 seconds then slowly lower your weight down to the floor foot. Repeat __________ times. Complete this exercise __________ times a day. Contact a health care provider if: Your exercise causes pain. Your pain is worse after you exercise. Your pain prevents you from doing your exercises. This information is not intended to replace advice given to you by your health care provider. Make sure you discuss any questions you have with your health care provider. Document Revised: 01/31/2020 Document Reviewed: 09/24/2019 Elsevier Patient Education  [place].  Hand Exercises Hand exercises can be helpful for almost anyone. These exercises can strengthen the hands, improve flexibility and movement, and increase blood flow to the hands. These results can make work and daily tasks easier. Hand exercises can be especially helpful for people who have joint pain  from arthritis or have nerve damage from overuse (carpal tunnel syndrome). These exercises can also help people who have injured a hand. Exercises Most of these hand exercises are gentle stretching and motion exercises. It is usually safe to do them often throughout the day. Warming up your hands before exercise may help to reduce stiffness. You can do this with gentle massage or by placing your hands in warm water for 10-15 minutes. It is normal to feel some stretching, pulling, tightness, or mild discomfort as you begin new exercises. This will gradually improve. Stop an exercise right away if you feel sudden, severe pain or your pain gets worse. Ask your health care provider which exercises are best for you. Knuckle bend or "claw" fist  Stand or sit with your arm, hand, and all five fingers pointed straight up. Make sure to keep your wrist straight during the exercise. Gently bend your fingers down toward your palm until the tips of your fingers are touching the top of your palm. Keep your big knuckle straight and just bend the small knuckles in your fingers. Hold this position for __________ seconds. Straighten (  extend) your fingers back to the starting position. Repeat this exercise 5-10 times with each hand. Full finger fist  Stand or sit with your arm, hand, and all five fingers pointed straight up. Make sure to keep your wrist straight during the exercise. Gently bend your fingers into your palm until the tips of your fingers are touching the middle of your palm. Hold this position for __________ seconds. Extend your fingers back to the starting position, stretching every joint fully. Repeat this exercise 5-10 times with each hand. Straight fist Stand or sit with your arm, hand, and all five fingers pointed straight up. Make sure to keep your wrist straight during the exercise. Gently bend your fingers at the big knuckle, where your fingers meet your hand, and the middle knuckle. Keep  the knuckle at the tips of your fingers straight and try to touch the bottom of your palm. Hold this position for __________ seconds. Extend your fingers back to the starting position, stretching every joint fully. Repeat this exercise 5-10 times with each hand. Tabletop  Stand or sit with your arm, hand, and all five fingers pointed straight up. Make sure to keep your wrist straight during the exercise. Gently bend your fingers at the big knuckle, where your fingers meet your hand, as far down as you can while keeping the small knuckles in your fingers straight. Think of forming a tabletop with your fingers. Hold this position for __________ seconds. Extend your fingers back to the starting position, stretching every joint fully. Repeat this exercise 5-10 times with each hand. Finger spread  Place your hand flat on a table with your palm facing down. Make sure your wrist stays straight as you do this exercise. Spread your fingers and thumb apart from each other as far as you can until you feel a gentle stretch. Hold this position for __________ seconds. Bring your fingers and thumb tight together again. Hold this position for __________ seconds. Repeat this exercise 5-10 times with each hand. Making circles  Stand or sit with your arm, hand, and all five fingers pointed straight up. Make sure to keep your wrist straight during the exercise. Make a circle by touching the tip of your thumb to the tip of your index finger. Hold for __________ seconds. Then open your hand wide. Repeat this motion with your thumb and each finger on your hand. Repeat this exercise 5-10 times with each hand. Thumb motion  Sit with your forearm resting on a table and your wrist straight. Your thumb should be facing up toward the ceiling. Keep your fingers relaxed as you move your thumb. Lift your thumb up as high as you can toward the ceiling. Hold for __________ seconds. Bend your thumb across your palm as far  as you can, reaching the tip of your thumb for the small finger (pinkie) side of your palm. Hold for __________ seconds. Repeat this exercise 5-10 times with each hand. Grip strengthening  Hold a stress ball or other soft ball in the middle of your hand. Slowly increase the pressure, squeezing the ball as much as you can without causing pain. Think of bringing the tips of your fingers into the middle of your palm. All of your finger joints should bend when doing this exercise. Hold your squeeze for __________ seconds, then relax. Repeat this exercise 5-10 times with each hand. Contact a health care provider if: Your hand pain or discomfort gets much worse when you do an exercise. Your hand pain or  discomfort does not improve within 2 hours after you exercise. If you have any of these problems, stop doing these exercises right away. Do not do them again unless your health care provider says that you can. Get help right away if: You develop sudden, severe hand pain or swelling. If this happens, stop doing these exercises right away. Do not do them again unless your health care provider says that you can. This information is not intended to replace advice given to you by your health care provider. Make sure you discuss any questions you have with your health care provider. Document Revised: 01/15/2021 Document Reviewed: 01/15/2021 Elsevier Patient Education  [place].
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
## 2022-11-03 ENCOUNTER — Ambulatory Visit (INDEPENDENT_AMBULATORY_CARE_PROVIDER_SITE_OTHER): Payer: Medicare Other | Admitting: Internal Medicine

## 2022-11-03 ENCOUNTER — Encounter: Payer: Self-pay | Admitting: Internal Medicine

## 2022-11-03 VITALS — BP 148/52 | HR 85 | Temp 97.7°F | Wt 246.0 lb

## 2022-11-03 DIAGNOSIS — E66813 Obesity, class 3: Secondary | ICD-10-CM | POA: Insufficient documentation

## 2022-11-03 DIAGNOSIS — E7849 Other hyperlipidemia: Secondary | ICD-10-CM | POA: Diagnosis not present

## 2022-11-03 DIAGNOSIS — E669 Obesity, unspecified: Secondary | ICD-10-CM

## 2022-11-03 DIAGNOSIS — J309 Allergic rhinitis, unspecified: Secondary | ICD-10-CM | POA: Diagnosis not present

## 2022-11-03 DIAGNOSIS — N3941 Urge incontinence: Secondary | ICD-10-CM

## 2022-11-03 DIAGNOSIS — Z6830 Body mass index (BMI) 30.0-30.9, adult: Secondary | ICD-10-CM | POA: Diagnosis not present

## 2022-11-03 DIAGNOSIS — E1169 Type 2 diabetes mellitus with other specified complication: Secondary | ICD-10-CM

## 2022-11-03 DIAGNOSIS — Z87891 Personal history of nicotine dependence: Secondary | ICD-10-CM

## 2022-11-03 DIAGNOSIS — F325 Major depressive disorder, single episode, in full remission: Secondary | ICD-10-CM

## 2022-11-03 DIAGNOSIS — F3342 Major depressive disorder, recurrent, in full remission: Secondary | ICD-10-CM

## 2022-11-03 DIAGNOSIS — K219 Gastro-esophageal reflux disease without esophagitis: Secondary | ICD-10-CM | POA: Diagnosis not present

## 2022-11-03 DIAGNOSIS — M1711 Unilateral primary osteoarthritis, right knee: Secondary | ICD-10-CM | POA: Diagnosis not present

## 2022-11-03 DIAGNOSIS — I10 Essential (primary) hypertension: Secondary | ICD-10-CM | POA: Diagnosis not present

## 2022-11-03 HISTORY — DX: Major depressive disorder, recurrent, in full remission: F33.42

## 2022-11-03 HISTORY — DX: Morbid (severe) obesity due to excess calories: E66.01

## 2022-11-03 HISTORY — DX: Obesity, class 3: E66.813

## 2022-11-03 MED ORDER — COLCHICINE 0.6 MG PO TABS: 0.6000 mg | ORAL_TABLET | Freq: Every day | ORAL | 0 refills | Status: DC | PRN

## 2022-11-03 MED ORDER — MIRABEGRON ER 50 MG PO TB24: 50.0000 mg | ORAL_TABLET | Freq: Every day | ORAL | 4 refills | Status: DC

## 2022-11-03 MED ORDER — OMEPRAZOLE 40 MG PO CPDR: 40.0000 mg | DELAYED_RELEASE_CAPSULE | Freq: Every day | ORAL | 3 refills | Status: DC

## 2022-11-03 MED ORDER — SENNOSIDES 8.6 MG PO TABS: 1.0000 | ORAL_TABLET | Freq: Every day | ORAL | 3 refills | Status: AC | PRN

## 2022-11-03 MED ORDER — AMLODIPINE BESYLATE 5 MG PO TABS: 5.0000 mg | ORAL_TABLET | Freq: Every day | ORAL | 3 refills | Status: DC

## 2022-11-03 MED ORDER — LORATADINE 10 MG PO TABS: ORAL_TABLET | ORAL | 1 refills | Status: DC

## 2022-11-03 MED ORDER — SPIRONOLACTONE 25 MG PO TABS: 25.0000 mg | ORAL_TABLET | Freq: Every day | ORAL | 3 refills | Status: DC

## 2022-11-03 MED ORDER — DICLOFENAC SODIUM 1 % EX GEL: CUTANEOUS | 4 refills | Status: DC

## 2022-11-03 MED ORDER — ATORVASTATIN CALCIUM 40 MG PO TABS: 40.0000 mg | ORAL_TABLET | Freq: Every day | ORAL | 3 refills | Status: DC

## 2022-11-03 MED ORDER — ACETAMINOPHEN ER 650 MG PO TBCR: 650.0000 mg | EXTENDED_RELEASE_TABLET | Freq: Three times a day (TID) | ORAL | 3 refills | Status: AC | PRN

## 2022-11-03 NOTE — Assessment & Plan Note (Signed)
-  Patient has chronic O/A affecting R knee, R shoulder, and bilateral hands. She sees Rheumatology for this. I reviewed their last note. She has Ortho's phone number and plans to call to schedule follow-up to discuss possible surgery.  - continue voltaren gel and tylenol prn

## 2022-11-03 NOTE — Assessment & Plan Note (Signed)
-  check lipids today - continue atorvastatin '40mg'$  daily

## 2022-11-03 NOTE — Assessment & Plan Note (Signed)
>>  ASSESSMENT AND PLAN FOR OSTEOARTHRITIS OF RIGHT KNEE WRITTEN ON 11/03/2022  9:41 AM BY Mercie Eon, MD  - Patient has chronic O/A affecting R knee, R shoulder, and bilateral hands. She sees Rheumatology for this. I reviewed their last note. She has Ortho's phone number and plans to call to schedule follow-up to discuss possible surgery.  - continue voltaren gel and tylenol prn

## 2022-11-03 NOTE — Assessment & Plan Note (Signed)
-  A1C <6.0 on past few checks.  - Well controlled with lifestyle changes only - She sees Ophtho in March 2024 - Urine microalbumin ordered today. She has angioedema to Lisinopril, so I would not do ACE/ARB in her - A1C every 12 months  - Stop checking blood sugars

## 2022-11-03 NOTE — Assessment & Plan Note (Signed)
-  Continue Amlodipine '5mg'$  daily and Spironolactone '25mg'$  daily. If blood pressure becomes elevated, could swap Spiro for HCTZ in the future - BMP today - [facility] referral

## 2022-11-03 NOTE — Assessment & Plan Note (Signed)
-  PHQ2 was 0 today - Not on medicines - I shared that [facility] [name] is available for CBT if she is interested in the future

## 2022-11-03 NOTE — Assessment & Plan Note (Signed)
-  PHQ2 is 0 today - I offered BHT with [name] in the future if patient is interested. She has some sadness about not being able to do physical activities that she was once able to do, but is in good spirits today

## 2022-11-03 NOTE — Assessment & Plan Note (Signed)
-  Problem is stable. Patient is interested in low impact exercise, so I have referred her to the [facility] program

## 2022-11-03 NOTE — Patient Instructions (Addendum)
It was a pleasure meeting you today.  I have refilled your medicines and sent these to your pharmacy.  One big update - You do not need to check your blood sugars! Your diabetes is extremely well controlled, and you are not on any medicines or insulin that would lower your blood sugar. So, you do not need to use the glucometer to check your blood sugars. We will check labs every 3-6 months to make sure that your diabetes stays well controlled.   I checked your labs today, and I'll call you with these results.  I have also put in a referral to the [facility] exercise program. They will call you to set this up.  I'll see you back in about 3 months.  Sincerely, Dr. Lottie Mussel

## 2022-11-04 LAB — BMP8+ANION GAP
Anion Gap: 16 mmol/L (ref 10.0–18.0)
BUN/Creatinine Ratio: 26 (ref 12–28)
BUN: 21 mg/dL (ref 8–27)
CO2: 23 mmol/L (ref 20–29)
Calcium: 9.4 mg/dL (ref 8.7–10.3)
Chloride: 100 mmol/L (ref 96–106)
Creatinine, Ser: 0.82 mg/dL (ref 0.57–1.00)
Glucose: 102 mg/dL — ABNORMAL HIGH (ref 70–99)
Potassium: 4.1 mmol/L (ref 3.5–5.2)
Sodium: 139 mmol/L (ref 134–144)
eGFR: 70 mL/min/{1.73_m2} (ref >59)

## 2022-11-04 LAB — LIPID PANEL
Chol/HDL Ratio: 3.1 ratio (ref 0.0–4.4)
Cholesterol, Total: 184 mg/dL (ref 100–199)
HDL: 59 mg/dL (ref >39)
LDL Chol Calc (NIH): 109 mg/dL — ABNORMAL HIGH (ref 0–99)
Triglycerides: 85 mg/dL (ref 0–149)
VLDL Cholesterol Cal: 16 mg/dL (ref 5–40)

## 2022-11-04 NOTE — Progress Notes (Signed)
Called patient to review labs. Labs look good. LDL is stable and within the range I'd like it to be for this 87yo woman. No changes made.

## 2022-11-05 ENCOUNTER — Telehealth: Payer: Self-pay

## 2022-11-05 LAB — MICROALBUMIN / CREATININE URINE RATIO
Creatinine, Urine: 44.4 mg/dL
Microalb/Creat Ratio: <7 mg/g creat (ref 0–29)
Microalbumin, Urine: <3 ug/mL

## 2022-11-05 NOTE — Telephone Encounter (Signed)
Called to discuss PREP program; offered attending at either [place] or [place]; she wants to attend one closer to her, will call me back with her deicision

## 2022-12-09 NOTE — Telephone Encounter (Signed)
ERROR

## 2022-12-20 DIAGNOSIS — H3562 Retinal hemorrhage, left eye: Secondary | ICD-10-CM | POA: Diagnosis not present

## 2022-12-20 DIAGNOSIS — H43821 Vitreomacular adhesion, right eye: Secondary | ICD-10-CM | POA: Diagnosis not present

## 2022-12-20 DIAGNOSIS — R7309 Other abnormal glucose: Secondary | ICD-10-CM | POA: Diagnosis not present

## 2022-12-20 DIAGNOSIS — H43393 Other vitreous opacities, bilateral: Secondary | ICD-10-CM | POA: Diagnosis not present

## 2022-12-20 DIAGNOSIS — H25813 Combined forms of age-related cataract, bilateral: Secondary | ICD-10-CM | POA: Diagnosis not present

## 2022-12-20 LAB — HM DIABETES EYE EXAM
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
## 2023-02-02 ENCOUNTER — Ambulatory Visit (INDEPENDENT_AMBULATORY_CARE_PROVIDER_SITE_OTHER): Payer: Medicare Other

## 2023-02-02 ENCOUNTER — Encounter: Payer: Self-pay | Admitting: Internal Medicine

## 2023-02-02 ENCOUNTER — Ambulatory Visit (INDEPENDENT_AMBULATORY_CARE_PROVIDER_SITE_OTHER): Payer: Medicare Other | Admitting: Internal Medicine

## 2023-02-02 VITALS — BP 134/59 | HR 91 | Temp 97.8°F | Ht 68.0 in | Wt 244.0 lb

## 2023-02-02 DIAGNOSIS — I10 Essential (primary) hypertension: Secondary | ICD-10-CM | POA: Diagnosis not present

## 2023-02-02 DIAGNOSIS — Z0000 Encounter for general adult medical examination without abnormal findings: Secondary | ICD-10-CM | POA: Diagnosis not present

## 2023-02-02 DIAGNOSIS — E669 Obesity, unspecified: Secondary | ICD-10-CM | POA: Diagnosis not present

## 2023-02-02 DIAGNOSIS — M542 Cervicalgia: Secondary | ICD-10-CM | POA: Diagnosis not present

## 2023-02-02 DIAGNOSIS — K219 Gastro-esophageal reflux disease without esophagitis: Secondary | ICD-10-CM | POA: Diagnosis not present

## 2023-02-02 DIAGNOSIS — Z6830 Body mass index (BMI) 30.0-30.9, adult: Secondary | ICD-10-CM | POA: Diagnosis not present

## 2023-02-02 DIAGNOSIS — E1136 Type 2 diabetes mellitus with diabetic cataract: Secondary | ICD-10-CM | POA: Diagnosis not present

## 2023-02-02 DIAGNOSIS — M1711 Unilateral primary osteoarthritis, right knee: Secondary | ICD-10-CM

## 2023-02-02 HISTORY — DX: Cervicalgia: M54.2

## 2023-02-02 MED ORDER — DICLOFENAC SODIUM 1 % EX GEL: CUTANEOUS | 4 refills | Status: DC

## 2023-02-02 NOTE — Assessment & Plan Note (Signed)
-   A1C <6, diet controlled - Urine MACR negative in 10/2022 - Request records from Dr Dione Booze Ophtho - Foot exam done today, normal

## 2023-02-02 NOTE — Assessment & Plan Note (Signed)
-   This seems mild, so I think she should try OTC Tums first line for reflux symptoms to see if she can decrease her Zofran and Omeprazole use

## 2023-02-02 NOTE — Assessment & Plan Note (Signed)
-   PREP referral placed again today

## 2023-02-02 NOTE — Patient Instructions (Signed)
Dear Christine [name],  It was a pleasure seeing you in clinic today.  I think your neck pain is a muscle strain. You can use voltaren gel on your neck and do the exercises I'm giving you. If your pain is not better in the next few weeks, please call us, and I will place a referral to physical therapy.  I have placed a new referral to the [facility] exercise program. They will call you to schedule this.  You will need to get your 2nd shingles shot at your pharmacy. This shot can make some people tired the next day, so don't get the shot the day before you have big plans.  For your acid reflux and nausea, you're on 2 pretty strong medicines right now - ondansetron and omeprazole. I'd like you to buy some over the counter Tums. Try a Tums when you have acid reflux symptoms to see if maybe you don't need the 2 strong medicines.   I'll see you back in 6 months. Please call us if you need anything sooner.  Sincerely, Dr. Mercie Eon

## 2023-02-02 NOTE — Assessment & Plan Note (Signed)
-   patient is due for 2nd shingrix shot, I encouraged her to get this at her pharmacy

## 2023-02-02 NOTE — Assessment & Plan Note (Signed)
-   I think this is a musculoskeletal pain - Continue Voltaren gel, which has been helpful - I provided her with stretches to do  - Call if not improved, and will refer to PT

## 2023-02-02 NOTE — Assessment & Plan Note (Addendum)
-   Continue Amlodipine  daily & Spironolactone  daily - Even though she has CKD, my SBP goal is <140 in this 87yo woman, as I don't want to drop her DBP too low. Continue current mgmt.  - Encouraged more physical activity through [facility]
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
## 2023-02-04 ENCOUNTER — Telehealth: Payer: Self-pay

## 2023-02-04 NOTE — Telephone Encounter (Signed)
Call to pt reference PREP referral and classes starting at [facility]. Requested call back to discuss.

## 2023-02-28 ENCOUNTER — Telehealth: Payer: Self-pay | Admitting: *Deleted

## 2023-02-28 NOTE — Patient Outreach (Signed)
  Care Coordination   02/28/2023 Name: Christine [name] MRN: [id number] DOB: [birth year]   Care Coordination Outreach Attempts:  An unsuccessful telephone outreach was attempted today to offer the patient information about available care coordination services.  Follow Up Plan:  Additional outreach attempts will be made to offer the patient care coordination information and services.   Encounter Outcome:  No Answer   Care Coordination Interventions:  No, not indicated    Reece Levy, MSW, LCSW Clinical Social Worker [facility] [phone]
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
## 2023-03-17 ENCOUNTER — Telehealth: Payer: Self-pay

## 2023-03-17 NOTE — Telephone Encounter (Signed)
VMT pt requesting call back reference next PREP class starting.   

## 2023-03-24 ENCOUNTER — Ambulatory Visit: Payer: Medicare Other | Attending: Physician Assistant | Admitting: Physician Assistant

## 2023-03-24 ENCOUNTER — Encounter: Payer: Self-pay | Admitting: Physician Assistant

## 2023-03-24 VITALS — BP 147/78 | HR 86 | Resp 17 | Ht 68.25 in | Wt 239.4 lb

## 2023-03-24 DIAGNOSIS — M160 Bilateral primary osteoarthritis of hip: Secondary | ICD-10-CM | POA: Insufficient documentation

## 2023-03-24 DIAGNOSIS — Z5181 Encounter for therapeutic drug level monitoring: Secondary | ICD-10-CM | POA: Diagnosis not present

## 2023-03-24 DIAGNOSIS — M7061 Trochanteric bursitis, right hip: Secondary | ICD-10-CM

## 2023-03-24 DIAGNOSIS — Z8639 Personal history of other endocrine, nutritional and metabolic disease: Secondary | ICD-10-CM | POA: Diagnosis not present

## 2023-03-24 DIAGNOSIS — M1120 Other chondrocalcinosis, unspecified site: Secondary | ICD-10-CM

## 2023-03-24 DIAGNOSIS — M1711 Unilateral primary osteoarthritis, right knee: Secondary | ICD-10-CM

## 2023-03-24 DIAGNOSIS — L409 Psoriasis, unspecified: Secondary | ICD-10-CM | POA: Insufficient documentation

## 2023-03-24 DIAGNOSIS — M19041 Primary osteoarthritis, right hand: Secondary | ICD-10-CM | POA: Diagnosis not present

## 2023-03-24 DIAGNOSIS — M19042 Primary osteoarthritis, left hand: Secondary | ICD-10-CM | POA: Insufficient documentation

## 2023-03-24 DIAGNOSIS — Z8719 Personal history of other diseases of the digestive system: Secondary | ICD-10-CM | POA: Diagnosis not present

## 2023-03-24 DIAGNOSIS — R7303 Prediabetes: Secondary | ICD-10-CM

## 2023-03-24 DIAGNOSIS — Z8679 Personal history of other diseases of the circulatory system: Secondary | ICD-10-CM | POA: Insufficient documentation

## 2023-03-24 MED ORDER — COLCHICINE 0.6 MG PO TABS: 0.6000 mg | ORAL_TABLET | Freq: Every day | ORAL | 0 refills | Status: DC | PRN

## 2023-03-25 LAB — COMPLETE METABOLIC PANEL WITH GFR
AG Ratio: 1.1 (calc) (ref 1.0–2.5)
ALT: 12 U/L (ref 6–29)
AST: 19 U/L (ref 10–35)
Albumin: 4.3 g/dL (ref 3.6–5.1)
Alkaline phosphatase (APISO): 100 U/L (ref 37–153)
BUN: 18 mg/dL (ref 7–25)
CO2: 27 mmol/L (ref 20–32)
Calcium: 9.8 mg/dL (ref 8.6–10.4)
Chloride: 100 mmol/L (ref 98–110)
Creat: 0.82 mg/dL (ref 0.60–0.95)
Globulin: 3.9 g/dL (calc) — ABNORMAL HIGH (ref 1.9–3.7)
Glucose, Bld: 133 mg/dL — ABNORMAL HIGH (ref 65–99)
Potassium: 4.3 mmol/L (ref 3.5–5.3)
Sodium: 138 mmol/L (ref 135–146)
Total Bilirubin: 0.5 mg/dL (ref 0.2–1.2)
Total Protein: 8.2 g/dL — ABNORMAL HIGH (ref 6.1–8.1)
eGFR: 70 mL/min/{1.73_m2} (ref >=60)

## 2023-03-25 LAB — CBC WITH DIFFERENTIAL/PLATELET
Absolute Monocytes: 304 cells/uL (ref 200–950)
Basophils Absolute: 13 cells/uL (ref 0–200)
Basophils Relative: 0.2 %
Eosinophils Absolute: 40 cells/uL (ref 15–500)
Eosinophils Relative: 0.6 %
HCT: 40.2 % (ref 35.0–45.0)
Hemoglobin: 13.3 g/dL (ref 11.7–15.5)
Lymphs Abs: 1716 cells/uL (ref 850–3900)
MCH: 28.8 pg (ref 27.0–33.0)
MCHC: 33.1 g/dL (ref 32.0–36.0)
MCV: 87 fL (ref 80.0–100.0)
MPV: 10.7 fL (ref 7.5–12.5)
Monocytes Relative: 4.6 %
Neutro Abs: 4528 cells/uL (ref 1500–7800)
Neutrophils Relative %: 68.6 %
Platelets: 164 10*3/uL (ref 140–400)
RBC: 4.62 10*6/uL (ref 3.80–5.10)
RDW: 13.4 % (ref 11.0–15.0)
Total Lymphocyte: 26 %
WBC: 6.6 10*3/uL (ref 3.8–10.8)

## 2023-03-25 NOTE — Progress Notes (Signed)
CBC WNL Glucose is 133. Total protein is borderline elevated. Rest of CMP WNL.

## 2023-03-29 ENCOUNTER — Telehealth: Payer: Self-pay | Admitting: *Deleted

## 2023-03-29 NOTE — Telephone Encounter (Signed)
Patient contacted the office and left message requesting a call back from [place]. Patient states she needs a call back at [phone]. Attempted to contact the patient. The phone rang and rang then recording came on advising to enter remote access code.

## 2023-04-04 ENCOUNTER — Telehealth: Payer: Self-pay | Admitting: *Deleted

## 2023-04-04 NOTE — Patient Outreach (Signed)
  Care Coordination   04/04/2023 Name: Christine [name] MRN: [id number] DOB: 1936/07/20   Care Coordination Outreach Attempts:  A second unsuccessful outreach was attempted today to offer the patient with information about available care coordination services.  Follow Up Plan:  Additional outreach attempts will be made to offer the patient care coordination information and services.   Encounter Outcome:  No Answer   Care Coordination Interventions:  No, not indicated   Reece Levy, MSW, LCSW Clinical Social Worker [facility] [phone]

## 2023-04-12 ENCOUNTER — Telehealth: Payer: Self-pay | Admitting: *Deleted

## 2023-04-12 NOTE — Patient Outreach (Signed)
  Care Coordination   04/12/2023 Name: Christine [name] MRN: [id number] DOB: [birth year]   Care Coordination Outreach Attempts:  A third unsuccessful outreach was attempted today to offer the patient with information about available care coordination services.  Follow Up Plan:  No further outreach attempts will be made at this time. We have been unable to contact the patient to offer or enroll patient in care coordination services  Encounter Outcome:  No Answer   Care Coordination Interventions:  No, not indicated    Reece Levy, MSW, LCSW Clinical Social Worker [facility] [phone]

## 2023-05-05 ENCOUNTER — Encounter: Payer: Self-pay | Admitting: Student

## 2023-05-05 ENCOUNTER — Other Ambulatory Visit: Payer: Self-pay

## 2023-05-05 ENCOUNTER — Ambulatory Visit (INDEPENDENT_AMBULATORY_CARE_PROVIDER_SITE_OTHER): Payer: Medicare Other | Admitting: Student

## 2023-05-05 VITALS — BP 159/72 | HR 84 | Temp 98.2°F | Ht 68.0 in | Wt 239.6 lb

## 2023-05-05 DIAGNOSIS — I10 Essential (primary) hypertension: Secondary | ICD-10-CM | POA: Diagnosis not present

## 2023-05-05 DIAGNOSIS — M542 Cervicalgia: Secondary | ICD-10-CM

## 2023-05-05 DIAGNOSIS — E1136 Type 2 diabetes mellitus with diabetic cataract: Secondary | ICD-10-CM | POA: Diagnosis not present

## 2023-05-05 LAB — POCT GLYCOSYLATED HEMOGLOBIN (HGB A1C): Hemoglobin A1C: 5.7 % — AB (ref 4.0–5.6)

## 2023-05-05 LAB — GLUCOSE, CAPILLARY: Glucose-Capillary: 137 mg/dL — ABNORMAL HIGH (ref 70–99)

## 2023-05-05 MED ORDER — SPIRONOLACTONE 50 MG PO TABS: 50.0000 mg | ORAL_TABLET | Freq: Every day | ORAL | 3 refills | Status: DC

## 2023-05-05 NOTE — Assessment & Plan Note (Signed)
Patient hypertensive today to 159/72. History of low diastolic pressures.  SBP goal less than 140 per PCP.  Plan: - Continue amlodipine 5 mg daily -Increase spironolactone to 50 mg daily
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
## 2023-05-05 NOTE — Patient Instructions (Addendum)
Thank you, Ms.Azelie C Rhudy for allowing [place] to provide your care today. Today we discussed neck pain.    I have ordered the following labs for you:  Lab Orders         Glucose, capillary         POC Hbg A1C      Referrals ordered today:   Referral Orders         Ambulatory referral to Physical Therapy       I have ordered the following medication/changed the following medications:   Stop the following medications: Medications Discontinued During This Encounter  Medication Reason   spironolactone (ALDACTONE) 25 MG tablet      Start the following medications: Meds ordered this encounter  Medications   spironolactone (ALDACTONE) 50 MG tablet    Sig: Take 1 tablet (50 mg total) by mouth daily.    Dispense:  90 tablet    Refill:  3     Follow up:  August 21st with Dr. Lafonda Mosses    We look forward to seeing you next time. Please call our clinic at [phone] if you have any questions or concerns. The best time to call is Monday-Friday from 9am-4pm, but there is someone available 24/7. If after hours or the weekend, call the main hospital number and ask for the Internal Medicine Resident On-Call. If you need medication refills, please notify your pharmacy one week in advance and they will send [place] a request.   Thank you for trusting me with your care. Wishing you the best!  Lovie Macadamia MD [facility]
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
## 2023-05-05 NOTE — Assessment & Plan Note (Signed)
Patient with history of type 2 diabetes well-controlled with diet and exercise.  Her weight has been steady over the last year, BMI 36 today.  Last A1c 5.9 on 08/03/2022.  Patient states that he followed up with eye doctor this year, and were told to have cataract.  Plan: - Continue lifestyle modification - A1c 5.7% today

## 2023-05-31 NOTE — Progress Notes (Unsigned)
[facility]: Clinic Note  Subjective:  History of Present Illness: Christine [name] is a 87 y.o. year old female who presents for routine follow up of her chronic medical conditions.  Her only concern today is ongoing left neck pain. She has been using voltaren gel with some relief. Pain is the same as at last visit. She starts PT on Saturday. It is worse with moving her head. No numbness, tingling, weakness in left hand.   For her hypertension, she has been taking Spironolactone 50mg  daily, but not Amlodipine 5mg  daily.   Otherwise, no concerns today. Doing well.    Please refer to Assessment and Plan below for full details in Problem-Based Charting.   Past Medical History:  Patient Active Problem List   Diagnosis Date Noted   Neck pain on left side 02/02/2023   Urinary incontinence 11/07/2017   Pseudogout 03/31/2017   Health care maintenance 10/09/2015   Type 2 diabetes mellitus (HCC) 09/23/2014   GERD 04/24/2009   Osteoarthritis 05/29/2007   Hyperlipidemia 08/10/2006   Obesity with body mass index of 30.0-39.9 08/10/2006   Essential hypertension 08/10/2006      Medications:  Current Outpatient Medications:    methocarbamol (ROBAXIN) 500 MG tablet, Take 1 tablet (500 mg total) by mouth every 8 (eight) hours as needed for muscle spasms., Disp: 90 tablet, Rfl: 0   acetaminophen (TYLENOL 8 HOUR ARTHRITIS PAIN) 650 MG CR tablet, Take 1 tablet (650 mg total) by mouth every 8 (eight) hours as needed for pain., Disp: 90 tablet, Rfl: 3   atorvastatin (LIPITOR) 40 MG tablet, Take 1 tablet (40 mg total) by mouth daily., Disp: 90 tablet, Rfl: 3   colchicine 0.6 MG tablet, Take 1 tablet (0.6 mg total) by mouth daily as needed (pseudogout flare)., Disp: 90 tablet, Rfl: 0   diclofenac Sodium (VOLTAREN) 1 % GEL, Apply 2-4 grams to affected area up to 4 times daily as needed., Disp: 400 g, Rfl: 4   loratadine (CLARITIN) 10 MG tablet, Take 1 pill daily for allergies,  Disp: 90 tablet, Rfl: 1   mirabegron ER (MYRBETRIQ) 50 MG TB24 tablet, Take 1 tablet (50 mg total) by mouth daily as needed (incontinence)., Disp: , Rfl:    omeprazole (PRILOSEC) 40 MG capsule, Take 1 capsule (40 mg total) by mouth daily., Disp: 90 capsule, Rfl: 3   senna (SENOKOT) 8.6 MG tablet, Take 1 tablet (8.6 mg total) by mouth daily as needed for constipation., Disp: 90 tablet, Rfl: 3   spironolactone (ALDACTONE) 50 MG tablet, Take 1 tablet (50 mg total) by mouth daily., Disp: 90 tablet, Rfl: 3   Allergies: Allergies  Allergen Reactions   Lisinopril Swelling    Angioedema   Influenza Vaccines Hives   Fexofenadine-Pseudoephed Er Itching and Rash    Causes itching and rash       Objective:   Vitals: Vitals:   06/01/23 0857  BP: 133/68  Pulse: 84  Temp: 97.8 F (36.6 C)  SpO2: 100%     Physical Exam: Physical Exam Constitutional:      Appearance: Normal appearance.  Cardiovascular:     Rate and Rhythm: Normal rate and regular rhythm.     Heart sounds: Normal heart sounds.  Pulmonary:     Effort: Pulmonary effort is normal.     Breath sounds: Normal breath sounds.  Musculoskeletal:     Comments: +trapezius muscle tightness on L>R side. No bony tenderness over cervical spine. +spurlings test  Neurological:  Mental Status: She is alert.      Data: Labs, imaging, and micro were reviewed in Epic. Refer to Assessment and Plan below for full details in Problem-Based Charting.  Assessment & Plan:  Osteoarthritis - Chronic and stable O/A affecting R knee, R shoulder, and bilateral hands - Follows with Rheumatology for this - She is established with Orthopedic surgery, and will call them if she would like to schedule surgery - Continue Voltaren gel and tylenol prn   Hyperlipidemia - Chronic and stable - Continue Atorvastatin 40mg  daily   Obesity with body mass index of 30.0-39.9 - chronic and stable - I encouraged physical activity. We tried to schedule  [employer] PREP program x3, but had trouble scheduling this  Essential hypertension - chronic and stable - At goal today on Spironolactone 50mg  daily. She is not taking the Amlodipine 5mg  daily. Check BMP today. Pending BMP, will reassess HTN meds - She has angioedema to ACE  GERD - Chronic and stable - Continue Omeprazole 40mg  daily for now - try to wean  Type 2 diabetes mellitus (HCC) - chronic and stable - Well controlled with lifestyle, not on meds - We need to get Ophtho records, will work on this - A1C 5.7 at last visit, goal <8  Pseudogout - chronic and stable - Rheumatology had prescribed daily colchicine, but patient finds it works best when taken daily as needed - Continue colchicine 0.6mg  daily as needed for worsening joint pain  Urinary incontinence - chronic and stable - continue Myrbetriq and pelvic floor exercises  Neck pain on left side - I think she has some nerve impingement probably related to chronic osteoarthritis. I also think the muscles around her left sided neck/shoulder, including trapezius are tight and in spasm - Add Robaxin 500mg  up to TID prn muscle spasm pain - I counseled her on the side effects and to not take if it made her sleepy or confused - PT, starts this Saturday - Continue Voltaren gel & tylenol prn - Continue stretches (gave handout last time) and heat prn  Health care maintenance - I encouraged patient to get her flu shot, covid booster, and 2nd shingrix shot at local pharmacy     Patient will follow up in 6 months   Mercie Eon, MD

## 2023-06-01 ENCOUNTER — Encounter: Payer: Self-pay | Admitting: Internal Medicine

## 2023-06-01 ENCOUNTER — Ambulatory Visit (INDEPENDENT_AMBULATORY_CARE_PROVIDER_SITE_OTHER): Payer: Medicare Other | Admitting: Internal Medicine

## 2023-06-01 VITALS — BP 133/68 | HR 84 | Temp 97.8°F | Ht 68.0 in | Wt 236.8 lb

## 2023-06-01 DIAGNOSIS — M150 Primary generalized (osteo)arthritis: Secondary | ICD-10-CM

## 2023-06-01 DIAGNOSIS — K219 Gastro-esophageal reflux disease without esophagitis: Secondary | ICD-10-CM | POA: Diagnosis not present

## 2023-06-01 DIAGNOSIS — E1136 Type 2 diabetes mellitus with diabetic cataract: Secondary | ICD-10-CM | POA: Diagnosis not present

## 2023-06-01 DIAGNOSIS — E785 Hyperlipidemia, unspecified: Secondary | ICD-10-CM | POA: Diagnosis not present

## 2023-06-01 DIAGNOSIS — I10 Essential (primary) hypertension: Secondary | ICD-10-CM

## 2023-06-01 DIAGNOSIS — M1711 Unilateral primary osteoarthritis, right knee: Secondary | ICD-10-CM

## 2023-06-01 DIAGNOSIS — Z87891 Personal history of nicotine dependence: Secondary | ICD-10-CM | POA: Diagnosis not present

## 2023-06-01 DIAGNOSIS — Z0000 Encounter for general adult medical examination without abnormal findings: Secondary | ICD-10-CM

## 2023-06-01 DIAGNOSIS — M1120 Other chondrocalcinosis, unspecified site: Secondary | ICD-10-CM

## 2023-06-01 DIAGNOSIS — E7849 Other hyperlipidemia: Secondary | ICD-10-CM

## 2023-06-01 DIAGNOSIS — N3941 Urge incontinence: Secondary | ICD-10-CM

## 2023-06-01 DIAGNOSIS — M542 Cervicalgia: Secondary | ICD-10-CM

## 2023-06-01 DIAGNOSIS — E669 Obesity, unspecified: Secondary | ICD-10-CM

## 2023-06-01 DIAGNOSIS — M159 Polyosteoarthritis, unspecified: Secondary | ICD-10-CM

## 2023-06-01 DIAGNOSIS — M19011 Primary osteoarthritis, right shoulder: Secondary | ICD-10-CM | POA: Diagnosis not present

## 2023-06-01 MED ORDER — MIRABEGRON ER 50 MG PO TB24
50.0000 mg | ORAL_TABLET | Freq: Every day | ORAL | Status: AC | PRN
Start: 2023-06-01 — End: ?

## 2023-06-01 MED ORDER — METHOCARBAMOL 500 MG PO TABS: 500.0000 mg | ORAL_TABLET | Freq: Three times a day (TID) | ORAL | 0 refills | Status: AC | PRN

## 2023-06-01 NOTE — Assessment & Plan Note (Signed)
-   chronic and stable - continue Myrbetriq and pelvic floor exercises

## 2023-06-01 NOTE — Assessment & Plan Note (Signed)
-   Chronic and stable - Continue Omeprazole 40mg  daily for now - try to wean

## 2023-06-01 NOTE — Assessment & Plan Note (Addendum)
-   Chronic and stable O/A affecting R knee, R shoulder, and bilateral hands - Follows with Rheumatology for this - She is established with Orthopedic surgery, and will call them if she would like to schedule surgery - Continue Voltaren gel and tylenol prn

## 2023-06-01 NOTE — Assessment & Plan Note (Signed)
-   chronic and stable - I encouraged physical activity. We tried to schedule [employer] PREP program x3, but had trouble scheduling this

## 2023-06-01 NOTE — Assessment & Plan Note (Signed)
-   I think she has some nerve impingement probably related to chronic osteoarthritis. I also think the muscles around her left sided neck/shoulder, including trapezius are tight and in spasm - Add Robaxin 500mg  up to TID prn muscle spasm pain - I counseled her on the side effects and to not take if it made her sleepy or confused - PT, starts this Saturday - Continue Voltaren gel & tylenol prn - Continue stretches (gave handout last time) and heat prn

## 2023-06-01 NOTE — Assessment & Plan Note (Signed)
-   Chronic and stable O/A affecting R knee, R shoulder, and bilateral hands - Follows with Rheumatology for this - She is established with Orthopedic surgery, and will call them if she would like to schedule surgery - Continue Voltaren gel and tylenol prn

## 2023-06-01 NOTE — Assessment & Plan Note (Signed)
-   Chronic and stable - Continue Atorvastatin 40mg  daily

## 2023-06-01 NOTE — Assessment & Plan Note (Addendum)
-   chronic and stable - Well controlled with lifestyle, not on meds - We need to get Ophtho records, will work on this - A1C 5.7 at last visit, goal <8

## 2023-06-01 NOTE — Assessment & Plan Note (Signed)
-   I encouraged patient to get her flu shot, covid booster, and 2nd shingrix shot at local pharmacy

## 2023-06-01 NOTE — Assessment & Plan Note (Addendum)
-   chronic and stable - At goal today on Spironolactone 50mg  daily. She is not taking the Amlodipine 5mg  daily. Check BMP today. Pending BMP, will reassess HTN meds - She has angioedema to ACE

## 2023-06-01 NOTE — Assessment & Plan Note (Signed)
-   chronic and stable - Rheumatology had prescribed daily colchicine, but patient finds it works best when taken daily as needed - Continue colchicine 0.6mg  daily as needed for worsening joint pain

## 2023-06-01 NOTE — Patient Instructions (Addendum)
Dear Christine [name],  It was a pleasure seeing you in clinic today.  Your neck muscles feel very tight. I am glad you're going to physical therapy this week. I have also sent in a new medicine called Robaxin to take as needed, up to three times a day, for muscle pain & spasms. Take the first dose in the evening because this medicine can make you sleepy. If you feel confused or weak, please stop taking this medicine.  I recommend that you get a COVID booster, flu shot, and your second Shingles shot at your local pharmacy. I expect pharmacies will get the flu shots within the next month.  I'll see you back here in 6 months.  Sincerely, Dr. Mercie Eon

## 2023-06-02 LAB — BMP8+ANION GAP
Anion Gap: 14 mmol/L (ref 10.0–18.0)
BUN/Creatinine Ratio: 24 (ref 12–28)
BUN: 23 mg/dL (ref 8–27)
CO2: 23 mmol/L (ref 20–29)
Calcium: 9.7 mg/dL (ref 8.7–10.3)
Chloride: 100 mmol/L (ref 96–106)
Creatinine, Ser: 0.96 mg/dL (ref 0.57–1.00)
Glucose: 107 mg/dL — ABNORMAL HIGH (ref 70–99)
Potassium: 4.4 mmol/L (ref 3.5–5.2)
Sodium: 137 mmol/L (ref 134–144)
eGFR: 58 mL/min/{1.73_m2} — ABNORMAL LOW (ref >59)

## 2023-06-04 ENCOUNTER — Ambulatory Visit: Payer: Medicare Other | Attending: Internal Medicine

## 2023-06-04 ENCOUNTER — Other Ambulatory Visit: Payer: Self-pay

## 2023-06-04 DIAGNOSIS — M5412 Radiculopathy, cervical region: Secondary | ICD-10-CM | POA: Diagnosis not present

## 2023-06-04 DIAGNOSIS — M542 Cervicalgia: Secondary | ICD-10-CM | POA: Diagnosis not present
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
## 2023-06-06 ENCOUNTER — Ambulatory Visit: Payer: Medicare Other

## 2023-06-06 DIAGNOSIS — M5412 Radiculopathy, cervical region: Secondary | ICD-10-CM | POA: Diagnosis not present

## 2023-06-06 DIAGNOSIS — M542 Cervicalgia: Secondary | ICD-10-CM | POA: Diagnosis not present

## 2023-06-06 NOTE — Therapy (Signed)
OUTPATIENT PHYSICAL THERAPY TREATMENT NOTE   Patient Name: Christine [name] MRN: [id number] DOB:Mar 18, 1936, 87 y.o., female Today's Date: 06/06/2023  END OF SESSION:  PT End of Session - 06/06/23 0827     Visit Number 2    Number of Visits 9    Date for PT Re-Evaluation 07/30/23    Authorization Type MCR    PT Start Time 0830    PT Stop Time 0910    PT Time Calculation (min) 40 min    Activity Tolerance Patient limited by pain    Behavior During Therapy [facility] for tasks assessed/performed              Past Medical History:  Diagnosis Date   Allergic rhinitis 08/10/2006   - chronic and stable  - Takes Claritin 10mg  daily as needed        Allergy    Arthritis    Cataract    Chronic pain of right knee 04/27/2018   Depression    Diabetes (HCC) 09/23/2014   GERD (gastroesophageal reflux disease)    per patient    Grief reaction 10/17/2018   Hip pain, acute, right 01/30/2018   Hyperlipemia    Hypertension    Impaired glucose tolerance    Morbidly obese (HCC)    Neck pain on left side 02/02/2023   Obesity, Class III, BMI 40-49.9 (morbid obesity) (HCC) 11/03/2022   Osteoarthritis    Pruritus    Pseudogout    Recurrent major depressive disorder, in full remission (HCC) 11/03/2022   Seasonal allergies    Thyroid nodule    THYROID NODULE 08/10/2006   - lobectomy 08/2003 multinod goiter         Unintended weight loss 01/27/2021   Unintended weight loss 01/27/2021   Past Surgical History:  Procedure Laterality Date   ABDOMINAL HYSTERECTOMY     COLONOSCOPY     KNEE SURGERY     TUMOR REMOVAL     Patient Active Problem List   Diagnosis Date Noted   Neck pain on left side 02/02/2023   Urinary incontinence 11/07/2017   Pseudogout 03/31/2017   Health care maintenance 10/09/2015   Type 2 diabetes mellitus (HCC) 09/23/2014   GERD 04/24/2009   Osteoarthritis 05/29/2007   Hyperlipidemia 08/10/2006   Obesity with body mass index of 30.0-39.9 08/10/2006   Essential  hypertension 08/10/2006    PCP: Mercie Eon, MD  REFERRING PROVIDER: Lovie Macadamia, MD,   Inez Catalina, MD     REFERRING DIAG: M54.2 (ICD-10-CM) - Neck pain on left side   THERAPY DIAG:  Cervicalgia  Radiculopathy, cervical region  Rationale for Evaluation and Treatment: Rehabilitation  ONSET DATE: 05/05/23  SUBJECTIVE:  SUBJECTIVE STATEMENT: Patient reports that she was in a lot of pain yesterday and was not able to lift her Lt arm.   Hand dominance: Right   PERTINENT HISTORY:  PMHx includes osteoarthritis, Depression, type 2 Diabetes, GERD, HTN, obesity, psuedogout   PAIN:  Are you having pain? Yes: NPRS scale: 6/10 Pain location: neck, headache Pain description: ache/aggravating/irritating, muscle twitches Aggravating factors: movement  Relieving factors: Voltaren gel  PRECAUTIONS: None  RED FLAGS: None; Patient denies changes in visual.     WEIGHT BEARING RESTRICTIONS: No  FALLS:  Has patient fallen in last 6 months? No  LIVING ENVIRONMENT: Lives with: lives alone Lives in: House/apartment Stairs: No Has following equipment at home: Single point cane and shower chair  OCCUPATION: n/a  PLOF: Independent  PATIENT GOALS: To have relief of symptoms  NEXT MD VISIT: 09/28/23  OBJECTIVE:   DIAGNOSTIC FINDINGS:  No imaging found in patient's chart/physician referral  PATIENT SURVEYS:  FOTO at next visit  06/06/23 28%  COGNITION: Overall cognitive status: Within functional limits for tasks assessed  SENSATION: Not tested  POSTURE: rounded shoulders, forward head, and flexed trunk   PALPATION: Patient has moderate-to-severe tenderness to palpation throughout bilateral cervical and thoracic musculature.  She has moderate muscle tension  bilaterally, that is painful with passive upper trap stretching.   CERVICAL ROM:   Active ROM A/PROM (deg) eval  Flexion 35  Extension 65  Right lateral flexion 20  Left lateral flexion 30  Right rotation 40  Left rotation 45    UPPER EXTREMITY ROM: Grossly within functional limits bilaterally  UPPER EXTREMITY MMT:  MMT Right eval Left eval  Shoulder flexion 5 4+  Shoulder extension    Shoulder abduction 4+ 4+  Shoulder adduction    Shoulder extension    Shoulder internal rotation    Shoulder external rotation    Middle trapezius    Lower trapezius    Elbow flexion 5 5  Elbow extension 5 5  Wrist flexion    Wrist extension    Wrist ulnar deviation    Wrist radial deviation    Wrist pronation    Wrist supination    Grip strength     (Blank rows = not tested)  CERVICAL SPECIAL TESTS:  Not assessed at time of evaluation   TODAY'S TREATMENT:              [facility] Adult PT Treatment:                                                DATE: 06/06/23 Therapeutic Exercise: Nustep level 3 x 5 mins SNAGs rotation x10 each Cervical extension over towel 2x10 Seated BIL ER with scap retraction RTB 2x10 Seated horizontal abduction RTB 2x10 Supine alternating shoulder flexion/extension 2x10 Supine chest press 2x10 BIL 500g ball Seated upper trap stretch x30" BIL Therapeutic Activity: Administration of FOTO 28% - therapist assist with iPad  [facility] Adult PT Treatment:                                                DATE: 06/04/2023  Initial evaluation: see patient education and home exercise program as noted below     PATIENT EDUCATION:  Education details: reviewed initial home exercise program; discussion of POC, prognosis and goals for skilled PT   Person educated: Patient Education method: Explanation and Demonstration Education comprehension: verbalized understanding and needs further  education  HOME EXERCISE PROGRAM: Access Code: [email or web address] URL: [email or web address] Date: 06/06/2023 Prepared by: Berta Minor  Exercises - Seated Neck Sidebending Stretch  - 1 x daily - 7 x weekly - 3 sets - 30 seconds hold - Seated Bilateral Shoulder External Rotation with Resistance  - 1 x daily - 7 x weekly - 3 sets - 10 reps  ASSESSMENT:  CLINICAL IMPRESSION: Patient presents to PT reporting increased pain in the Lt side of her neck yesterday and that it is a little better today. Administered FOTO this session with patient requiring therapist assistance for iPad use and scoring 28%. Session today focused on shoulder strengthening and stretching for her neck, provided HEP with patient demonstrating understanding of each exercise. Patient continues to benefit from skilled PT services and should be progressed as able to improve functional independence.    OBJECTIVE IMPAIRMENTS: decreased activity tolerance, decreased endurance, decreased knowledge of condition, decreased ROM, decreased strength, increased muscle spasms, impaired UE functional use, postural dysfunction, and pain.   ACTIVITY LIMITATIONS: carrying, lifting, sleeping, and hygiene/grooming  PARTICIPATION LIMITATIONS: meal prep, cleaning, laundry, interpersonal relationship, shopping, and community activity  PERSONAL FACTORS: Age, Past/current experiences, and 3+ comorbidities: PMHx includes osteoarthritis, Depression, type 2 Diabetes, GERD, HTN, obesity, psuedogout  are also affecting patient's functional outcome.   REHAB POTENTIAL: Fair    CLINICAL DECISION MAKING: Evolving/moderate complexity  EVALUATION COMPLEXITY: Moderate   GOALS: Goals reviewed with patient? Yes  SHORT TERM GOALS: Target date: 07/02/2023   Patient will be independent with initial home program for cervical muscle flexibility, symptom management/pain modulation activities.  Baseline: provided at eval  Goal status:  INITIAL  2.  Patient will demonstrate improved postural awareness for at least 15 minutes while seated without need for cueing from PT.   Baseline: see objective measures  Goal status: INITIAL   LONG TERM GOALS: Target date: 07/30/2023   Patient will report improved overall functional ability with FOTO score of TBD.  Baseline: to be assessed at f/u visit  Goal status: INITIAL  2.  Patient will demonstrate improved cervical lateral flexion AROM to at least 35 degrees bilaterally. Baseline:  Active ROM A/PROM (deg) eval  Flexion 35  Extension 65  Right lateral flexion 20  Left lateral flexion 30  Right rotation 40  Left rotation 45   Goal status: INITIAL  3.  Patient will demonstrate improved left shoulder strength, with 5/5 MMT score. Baseline:  MMT Right eval Left eval  Shoulder flexion 5 4+  Shoulder extension    Shoulder abduction 4+ 4+   Goal status: INITIAL  4.  Patient will have no more than minimal cervical muscle tension with palpation and no more than minimal tenderness to palpation bilaterally.  Baseline:  Goal status: INITIAL  5.  Patient will report no recent headaches and the past 2 weeks. Baseline: HA every other week Goal status: INITIAL  6.  Patient will report no limitations with ADLs/IADLs related to neck or left shoulder pain.   Baseline: currently limited by pain Goal status: INITIAL   PLAN:  PT FREQUENCY: 1-2x/week  PT DURATION: 8 weeks  PLANNED INTERVENTIONS: Therapeutic exercises, Therapeutic activity, Neuromuscular re-education, Patient/Family education, Self Care, Joint mobilization, Dry Needling, Electrical stimulation, Spinal manipulation, Spinal mobilization, Cryotherapy, Moist heat, Traction, Manual therapy, and Re-evaluation  PLAN FOR NEXT SESSION: review medications, CS mobility assessment, cervical retraction and isometrics, CS stretches, manual therapy as indicted, create and review initial home exercise program   Berta Minor, PTA 06/06/2023, 9:13 AM

## 2023-06-22 NOTE — Therapy (Signed)
OUTPATIENT PHYSICAL THERAPY TREATMENT NOTE   Patient Name: Christine [name] MRN: [id number] DOB:1936-08-28, 87 y.o., female Today's Date: 06/23/2023  END OF SESSION:  PT End of Session - 06/23/23 0757     Visit Number 3    Number of Visits 9    Date for PT Re-Evaluation 07/30/23    Authorization Type MCR    Progress Note Due on Visit 10    PT Start Time 0800    PT Stop Time 0839    PT Time Calculation (min) 39 min    Activity Tolerance Patient tolerated treatment well    Behavior During Therapy [facility] for tasks assessed/performed               Past Medical History:  Diagnosis Date   Allergic rhinitis 08/10/2006   - chronic and stable  - Takes Claritin 10mg  daily as needed        Allergy    Arthritis    Cataract    Chronic pain of right knee 04/27/2018   Depression    Diabetes (HCC) 09/23/2014   GERD (gastroesophageal reflux disease)    per patient    Grief reaction 10/17/2018   Hip pain, acute, right 01/30/2018   Hyperlipemia    Hypertension    Impaired glucose tolerance    Morbidly obese (HCC)    Neck pain on left side 02/02/2023   Obesity, Class III, BMI 40-49.9 (morbid obesity) (HCC) 11/03/2022   Osteoarthritis    Pruritus    Pseudogout    Recurrent major depressive disorder, in full remission (HCC) 11/03/2022   Seasonal allergies    Thyroid nodule    THYROID NODULE 08/10/2006   - lobectomy 08/2003 multinod goiter         Unintended weight loss 01/27/2021   Unintended weight loss 01/27/2021   Past Surgical History:  Procedure Laterality Date   ABDOMINAL HYSTERECTOMY     COLONOSCOPY     KNEE SURGERY     TUMOR REMOVAL     Patient Active Problem List   Diagnosis Date Noted   Neck pain on left side 02/02/2023   Urinary incontinence 11/07/2017   Pseudogout 03/31/2017   Health care maintenance 10/09/2015   Type 2 diabetes mellitus (HCC) 09/23/2014   GERD 04/24/2009   Osteoarthritis 05/29/2007   Hyperlipidemia 08/10/2006   Obesity with body mass  index of 30.0-39.9 08/10/2006   Essential hypertension 08/10/2006    PCP: Mercie Eon, MD  REFERRING PROVIDER: Lovie Macadamia, MD,   Inez Catalina, MD     REFERRING DIAG: M54.2 (ICD-10-CM) - Neck pain on left side   THERAPY DIAG:  Cervicalgia  Rationale for Evaluation and Treatment: Rehabilitation  ONSET DATE: 05/05/23  SUBJECTIVE:  SUBJECTIVE STATEMENT: States she was a little sore after last session, about a day, felt good relief when it cleared up. Seems to be having less headaches. Has tried HEP with good relief, but has trouble reading the sheet due to font size. No headache at present, "a little bit" of pain when moving neck, about 6/10   Hand dominance: Right   PERTINENT HISTORY:  PMHx includes osteoarthritis, Depression, type 2 Diabetes, GERD, HTN, obesity, psuedogout   PAIN:  Are you having pain? Yes: NPRS scale: 6/10   Pain location: neck Pain description: ache/aggravating/irritating, muscle twitches Aggravating factors: movement  Relieving factors: Voltaren gel  PRECAUTIONS: None  RED FLAGS: None; Patient denies changes in visual.     WEIGHT BEARING RESTRICTIONS: No  FALLS:  Has patient fallen in last 6 months? No  LIVING ENVIRONMENT: Lives with: lives alone Lives in: House/apartment Stairs: No Has following equipment at home: Single point cane and shower chair  OCCUPATION: n/a  PLOF: Independent  PATIENT GOALS: To have relief of symptoms  NEXT MD VISIT: 09/28/23  OBJECTIVE: (objective measures completed at initial evaluation unless otherwise dated)   DIAGNOSTIC FINDINGS:  No imaging found in patient's chart/physician referral  PATIENT SURVEYS:  FOTO at next visit  06/06/23 28%  COGNITION: Overall cognitive status: Within  functional limits for tasks assessed  SENSATION: Not tested  POSTURE: rounded shoulders, forward head, and flexed trunk   PALPATION: Patient has moderate-to-severe tenderness to palpation throughout bilateral cervical and thoracic musculature.  She has moderate muscle tension bilaterally, that is painful with passive upper trap stretching.   CERVICAL ROM:   Active ROM A/PROM (deg) eval  Flexion 35  Extension 65  Right lateral flexion 20  Left lateral flexion 30  Right rotation 40  Left rotation 45    UPPER EXTREMITY ROM: Grossly within functional limits bilaterally  UPPER EXTREMITY MMT:  MMT Right eval Left eval  Shoulder flexion 5 4+  Shoulder extension    Shoulder abduction 4+ 4+  Shoulder adduction    Shoulder extension    Shoulder internal rotation    Shoulder external rotation    Middle trapezius    Lower trapezius    Elbow flexion 5 5  Elbow extension 5 5  Wrist flexion    Wrist extension    Wrist ulnar deviation    Wrist radial deviation    Wrist pronation    Wrist supination    Grip strength     (Blank rows = not tested)  CERVICAL SPECIAL TESTS:  Not assessed at time of evaluation   TODAY'S TREATMENT:              [facility] Adult PT Treatment:                                                DATE: 06/23/23 Therapeutic Exercise: Nu step L3 during subjective  Double ER + scapular retraction 2x10 cues for posture and head positioning Shoulder horizontal abduction 2x10 cues for form and pacing Seated fwd chest press, 1kg ball, x8, x12 cues for posture, pacing, and sitting unsupported  Towel assisted cervical rotation x10 BIL significant cueing for arm positioning/sequencing and appropriate setup  Seated cervicothoracic chair stretch into extension - 2x10 cues for comfortable positioning/ROM Scapular retraction x15 cues for posture, reduced UT compensations HEP update + education/handout   [facility] Adult PT Treatment:  DATE: 06/06/23 Therapeutic Exercise: Nustep level 3 x 5 mins SNAGs rotation x10 each Cervical extension over towel 2x10 Seated BIL ER with scap retraction RTB 2x10 Seated horizontal abduction RTB 2x10 Supine alternating shoulder flexion/extension 2x10 Supine chest press 2x10 BIL 500g ball Seated upper trap stretch x30" BIL Therapeutic Activity: Administration of FOTO 28% - therapist assist with iPad                                                                                     [facility] Adult PT Treatment:                                                DATE: 06/04/2023  Initial evaluation: see patient education and home exercise program as noted below     PATIENT EDUCATION:  Education details: rationale for interventions, HEP   Person educated: Patient Education method: Explanation and Demonstration Education comprehension: verbalized understanding and needs further education  HOME EXERCISE PROGRAM: Access Code: [email or web address] URL: [email or web address] Date: 06/06/2023 Prepared by: Berta Minor  Exercises - Seated Neck Sidebending Stretch  - 1 x daily - 7 x weekly - 3 sets - 30 seconds hold - Seated Bilateral Shoulder External Rotation with Resistance  - 1 x daily - 7 x weekly - 3 sets - 10 reps  ASSESSMENT:  CLINICAL IMPRESSION: Pt reports 6/10 pain in neck at present, no headache. Reports mild soreness for about a day after last session but good relief once that cleared, not having as many headaches. Today we continue with familiar exercises with emphasis on appropriate form, progression for volume/intensity limited by pt report of muscular fatigue. Does have some mild/transient provocation of headache with first set of horizontal abduction, resolves with rest and does not occur on second set. Tendency for improved tolerance with repetition with all exercises. No adverse events, cues as above. Pt reports 0/10 pain on departure and no headache. HEP update  as above with largest font size. Recommend continuing along current POC in order to address relevant deficits and improve functional tolerance. Pt departs today's session in no acute distress, all voiced questions/concerns addressed appropriately from PT perspective.     OBJECTIVE IMPAIRMENTS: decreased activity tolerance, decreased endurance, decreased knowledge of condition, decreased ROM, decreased strength, increased muscle spasms, impaired UE functional use, postural dysfunction, and pain.   ACTIVITY LIMITATIONS: carrying, lifting, sleeping, and hygiene/grooming  PARTICIPATION LIMITATIONS: meal prep, cleaning, laundry, interpersonal relationship, shopping, and community activity  PERSONAL FACTORS: Age, Past/current experiences, and 3+ comorbidities: PMHx includes osteoarthritis, Depression, type 2 Diabetes, GERD, HTN, obesity, psuedogout  are also affecting patient's functional outcome.   REHAB POTENTIAL: Fair    CLINICAL DECISION MAKING: Evolving/moderate complexity  EVALUATION COMPLEXITY: Moderate   GOALS: Goals reviewed with patient? Yes  SHORT TERM GOALS: Target date: 07/02/2023   Patient will be independent with initial home program for cervical muscle flexibility, symptom management/pain modulation activities.  Baseline: provided at eval  Goal status: INITIAL  2.  Patient will demonstrate improved postural awareness for at least 15 minutes while  seated without need for cueing from PT.   Baseline: see objective measures  Goal status: INITIAL   LONG TERM GOALS: Target date: 07/30/2023   Patient will report improved overall functional ability with FOTO score of TBD.  Baseline: to be assessed at f/u visit  Goal status: INITIAL  2.  Patient will demonstrate improved cervical lateral flexion AROM to at least 35 degrees bilaterally. Baseline:  Active ROM A/PROM (deg) eval  Flexion 35  Extension 65  Right lateral flexion 20  Left lateral flexion 30  Right rotation 40   Left rotation 45   Goal status: INITIAL  3.  Patient will demonstrate improved left shoulder strength, with 5/5 MMT score. Baseline:  MMT Right eval Left eval  Shoulder flexion 5 4+  Shoulder extension    Shoulder abduction 4+ 4+   Goal status: INITIAL  4.  Patient will have no more than minimal cervical muscle tension with palpation and no more than minimal tenderness to palpation bilaterally.  Baseline:  Goal status: INITIAL  5.  Patient will report no recent headaches and the past 2 weeks. Baseline: HA every other week Goal status: INITIAL  6.  Patient will report no limitations with ADLs/IADLs related to neck or left shoulder pain.   Baseline: currently limited by pain Goal status: INITIAL   PLAN:  PT FREQUENCY: 1-2x/week  PT DURATION: 8 weeks  PLANNED INTERVENTIONS: Therapeutic exercises, Therapeutic activity, Neuromuscular re-education, Patient/Family education, Self Care, Joint mobilization, Dry Needling, Electrical stimulation, Spinal manipulation, Spinal mobilization, Cryotherapy, Moist heat, Traction, Manual therapy, and Re-evaluation  PLAN FOR NEXT SESSION: cervical retraction and isometrics, CS stretches, manual therapy as indicated. Postural mobility/endurance. Update/review HEP PRN   Ashley Murrain PT, DPT 06/23/2023 8:44 AM

## 2023-06-23 ENCOUNTER — Encounter: Payer: Self-pay | Admitting: Physical Therapy

## 2023-06-23 ENCOUNTER — Ambulatory Visit: Payer: Medicare Other | Attending: Internal Medicine | Admitting: Physical Therapy

## 2023-06-23 DIAGNOSIS — M542 Cervicalgia: Secondary | ICD-10-CM | POA: Diagnosis not present

## 2023-06-23 DIAGNOSIS — M5412 Radiculopathy, cervical region: Secondary | ICD-10-CM | POA: Insufficient documentation

## 2023-06-30 ENCOUNTER — Ambulatory Visit: Payer: Medicare Other

## 2023-06-30 DIAGNOSIS — M542 Cervicalgia: Secondary | ICD-10-CM | POA: Diagnosis not present

## 2023-06-30 DIAGNOSIS — M5412 Radiculopathy, cervical region: Secondary | ICD-10-CM | POA: Diagnosis not present

## 2023-06-30 NOTE — Therapy (Signed)
OUTPATIENT PHYSICAL THERAPY TREATMENT NOTE   Patient Name: Christine [name] MRN: [id number] DOB:[birth year], 87 y.o., female Today's Date: 06/30/2023  END OF SESSION:  PT End of Session - 06/30/23 0827     Visit Number 4    Number of Visits 9    Date for PT Re-Evaluation 07/30/23    Authorization Type MCR    Progress Note Due on Visit 10    PT Start Time 0830    PT Stop Time 0910    PT Time Calculation (min) 40 min    Activity Tolerance Patient tolerated treatment well    Behavior During Therapy [facility] for tasks assessed/performed                Past Medical History:  Diagnosis Date   Allergic rhinitis 08/10/2006   - chronic and stable  - Takes Claritin 10mg  daily as needed        Allergy    Arthritis    Cataract    Chronic pain of right knee 04/27/2018   Depression    Diabetes (HCC) 09/23/2014   GERD (gastroesophageal reflux disease)    per patient    Grief reaction 10/17/2018   Hip pain, acute, right 01/30/2018   Hyperlipemia    Hypertension    Impaired glucose tolerance    Morbidly obese (HCC)    Neck pain on left side 02/02/2023   Obesity, Class III, BMI 40-49.9 (morbid obesity) (HCC) 11/03/2022   Osteoarthritis    Pruritus    Pseudogout    Recurrent major depressive disorder, in full remission (HCC) 11/03/2022   Seasonal allergies    Thyroid nodule    THYROID NODULE 08/10/2006   - lobectomy 08/2003 multinod goiter         Unintended weight loss 01/27/2021   Unintended weight loss 01/27/2021   Past Surgical History:  Procedure Laterality Date   ABDOMINAL HYSTERECTOMY     COLONOSCOPY     KNEE SURGERY     TUMOR REMOVAL     Patient Active Problem List   Diagnosis Date Noted   Neck pain on left side 02/02/2023   Urinary incontinence 11/07/2017   Pseudogout 03/31/2017   Health care maintenance 10/09/2015   Type 2 diabetes mellitus (HCC) 09/23/2014   GERD 04/24/2009   Osteoarthritis 05/29/2007   Hyperlipidemia 08/10/2006   Obesity with body mass  index of 30.0-39.9 08/10/2006   Essential hypertension 08/10/2006    PCP: Mercie Eon, MD  REFERRING PROVIDER: Lovie Macadamia, MD,   Inez Catalina, MD     REFERRING DIAG: M54.2 (ICD-10-CM) - Neck pain on left side   THERAPY DIAG:  Cervicalgia  Radiculopathy, cervical region  Rationale for Evaluation and Treatment: Rehabilitation  ONSET DATE: 05/05/23  SUBJECTIVE:  SUBJECTIVE STATEMENT: Patient reporting 5/10 neck pain today. States that yesterday was worse. She had one headache earlier this week.    Hand dominance: Right   PERTINENT HISTORY:  PMHx includes osteoarthritis, Depression, type 2 Diabetes, GERD, HTN, obesity, psuedogout   PAIN:  Are you having pain? Yes: NPRS scale: 6/10   Pain location: neck Pain description: ache/aggravating/irritating, muscle twitches Aggravating factors: movement  Relieving factors: Voltaren gel  PRECAUTIONS: None  RED FLAGS: None; Patient denies changes in visual.     WEIGHT BEARING RESTRICTIONS: No  FALLS:  Has patient fallen in last 6 months? No  LIVING ENVIRONMENT: Lives with: lives alone Lives in: House/apartment Stairs: No Has following equipment at home: Single point cane and shower chair  OCCUPATION: n/a  PLOF: Independent  PATIENT GOALS: To have relief of symptoms  NEXT MD VISIT: 09/28/23  OBJECTIVE: (objective measures completed at initial evaluation unless otherwise dated)   DIAGNOSTIC FINDINGS:  No imaging found in patient's chart/physician referral  PATIENT SURVEYS:  FOTO at next visit  06/06/23 28%  COGNITION: Overall cognitive status: Within functional limits for tasks assessed  SENSATION: Not tested  POSTURE: rounded shoulders, forward head, and flexed trunk   PALPATION: Patient has  moderate-to-severe tenderness to palpation throughout bilateral cervical and thoracic musculature.  She has moderate muscle tension bilaterally, that is painful with passive upper trap stretching.   CERVICAL ROM:   Active ROM A/PROM (deg) eval  Flexion 35  Extension 65  Right lateral flexion 20  Left lateral flexion 30  Right rotation 40  Left rotation 45    UPPER EXTREMITY ROM: Grossly within functional limits bilaterally  UPPER EXTREMITY MMT:  MMT Right eval Left eval  Shoulder flexion 5 4+  Shoulder extension    Shoulder abduction 4+ 4+  Shoulder adduction    Shoulder extension    Shoulder internal rotation    Shoulder external rotation    Middle trapezius    Lower trapezius    Elbow flexion 5 5  Elbow extension 5 5  Wrist flexion    Wrist extension    Wrist ulnar deviation    Wrist radial deviation    Wrist pronation    Wrist supination    Grip strength     (Blank rows = not tested)  CERVICAL SPECIAL TESTS:  Not assessed at time of evaluation   TODAY'S TREATMENT:          [facility] Adult PT Treatment:                                                DATE: 06/30/2023  Therapeutic Exercise: UBE fwd x 2 minutes @ level 2, backward x 2 minutes @ level 1  Double ER + scapular retraction 2x10 GTBcues for posture and head positioning Shoulder horizontal abduction 2x10 RTB cues for form and pacing Seated fwd chest press, 2kg ball, 2 x 8 SNAGs Towel assisted cervical rotation 2x10 BIL significant cueing for arm positioning/sequencing and appropriate setup  Seated cervicothoracic chair stretch into extension - 2x10 cues for comfortable positioning/ROM Seated upper trap stretch, 3 x 20 sec each         [facility] Adult PT Treatment:  DATE: 06/23/23 Therapeutic Exercise: Nu step L3 during subjective  Double ER + scapular retraction 2x10 cues for posture and head positioning Shoulder horizontal abduction 2x10 cues for form and  pacing Seated fwd chest press, 1kg ball, x8, x12 cues for posture, pacing, and sitting unsupported  Towel assisted cervical rotation x10 BIL significant cueing for arm positioning/sequencing and appropriate setup  Seated cervicothoracic chair stretch into extension - 2x10 cues for comfortable positioning/ROM Scapular retraction x15 cues for posture, reduced UT compensations HEP update + education/handout   [facility] Adult PT Treatment:                                                DATE: 06/06/23 Therapeutic Exercise: Nustep level 3 x 5 mins SNAGs rotation x10 each Cervical extension over towel 2x10 Seated BIL ER with scap retraction RTB 2x10 Seated horizontal abduction RTB 2x10 Supine alternating shoulder flexion/extension 2x10 Supine chest press 2x10 BIL 500g ball Seated upper trap stretch x30" BIL  Therapeutic Activity: Administration of FOTO 28% - therapist assist with iPad                                                                                     [facility] Adult PT Treatment:                                                DATE: 06/04/2023  Initial evaluation: see patient education and home exercise program as noted below     PATIENT EDUCATION:  Education details: rationale for interventions, HEP   Person educated: Patient Education method: Explanation and Demonstration Education comprehension: verbalized understanding and needs further education  HOME EXERCISE PROGRAM: Access Code: [email or web address] URL: [email or web address] Date: 06/06/2023 Prepared by: Berta Minor  Exercises - Seated Neck Sidebending Stretch  - 1 x daily - 7 x weekly - 3 sets - 30 seconds hold - Seated Bilateral Shoulder External Rotation with Resistance  - 1 x daily - 7 x weekly - 3 sets - 10 reps  ASSESSMENT:  CLINICAL IMPRESSION: Patient continues to require additional verbal and tactile cues for correct performance of exercises today.  She was able to perform increased repetitions  with exercises today. She responded well UBE at start of today's session, but was unable to tolerate level 2 resistance with backwards revolutions. She had some dizziness with CS AROM, that was improved with cuing for eyes closed.    OBJECTIVE IMPAIRMENTS: decreased activity tolerance, decreased endurance, decreased knowledge of condition, decreased ROM, decreased strength, increased muscle spasms, impaired UE functional use, postural dysfunction, and pain.   ACTIVITY LIMITATIONS: carrying, lifting, sleeping, and hygiene/grooming  PARTICIPATION LIMITATIONS: meal prep, cleaning, laundry, interpersonal relationship, shopping, and community activity  PERSONAL FACTORS: Age, Past/current experiences, and 3+ comorbidities: PMHx includes osteoarthritis, Depression, type 2 Diabetes, GERD, HTN, obesity, psuedogout  are also affecting patient's functional outcome.   REHAB POTENTIAL: Fair    CLINICAL  DECISION MAKING: Evolving/moderate complexity  EVALUATION COMPLEXITY: Moderate   GOALS: Goals reviewed with patient? Yes  SHORT TERM GOALS: Target date: 07/02/2023   Patient will be independent with initial home program for cervical muscle flexibility, symptom management/pain modulation activities.  Baseline: provided at eval  Goal status: INITIAL  2.  Patient will demonstrate improved postural awareness for at least 15 minutes while seated without need for cueing from PT.   Baseline: see objective measures  Goal status: INITIAL   LONG TERM GOALS: Target date: 07/30/2023   Patient will report improved overall functional ability with FOTO score of TBD.  Baseline: to be assessed at f/u visit  Goal status: INITIAL  2.  Patient will demonstrate improved cervical lateral flexion AROM to at least 35 degrees bilaterally. Baseline:  Active ROM A/PROM (deg) eval  Flexion 35  Extension 65  Right lateral flexion 20  Left lateral flexion 30  Right rotation 40  Left rotation 45   Goal status:  INITIAL  3.  Patient will demonstrate improved left shoulder strength, with 5/5 MMT score. Baseline:  MMT Right eval Left eval  Shoulder flexion 5 4+  Shoulder extension    Shoulder abduction 4+ 4+   Goal status: INITIAL  4.  Patient will have no more than minimal cervical muscle tension with palpation and no more than minimal tenderness to palpation bilaterally.  Baseline:  Goal status: INITIAL  5.  Patient will report no recent headaches and the past 2 weeks. Baseline: HA every other week Goal status: INITIAL  6.  Patient will report no limitations with ADLs/IADLs related to neck or left shoulder pain.   Baseline: currently limited by pain Goal status: INITIAL   PLAN:  PT FREQUENCY: 1-2x/week  PT DURATION: 8 weeks  PLANNED INTERVENTIONS: Therapeutic exercises, Therapeutic activity, Neuromuscular re-education, Patient/Family education, Self Care, Joint mobilization, Dry Needling, Electrical stimulation, Spinal manipulation, Spinal mobilization, Cryotherapy, Moist heat, Traction, Manual therapy, and Re-evaluation  PLAN FOR NEXT SESSION: cervical retraction and isometrics, CS stretches, manual therapy as indicated. Postural mobility/endurance. Update/review HEP PRN   Mauri Reading, PT, DPT   06/30/2023 9:53 AM

## 2023-07-06 ENCOUNTER — Encounter: Payer: Self-pay | Admitting: Dietician

## 2023-07-07 ENCOUNTER — Ambulatory Visit: Payer: Medicare Other

## 2023-07-07 DIAGNOSIS — M5412 Radiculopathy, cervical region: Secondary | ICD-10-CM

## 2023-07-07 DIAGNOSIS — M542 Cervicalgia: Secondary | ICD-10-CM | POA: Diagnosis not present

## 2023-07-07 NOTE — Therapy (Signed)
OUTPATIENT PHYSICAL THERAPY TREATMENT NOTE   Patient Name: Christine [name] MRN: [id number] DOB:1935-10-27, 87 y.o., female Today's Date: 07/07/2023  END OF SESSION:  PT End of Session - 07/07/23 0830     Visit Number 5    Number of Visits 9    Date for PT Re-Evaluation 07/30/23    Authorization Type MCR    PT Start Time 0830    PT Stop Time 0910    PT Time Calculation (min) 40 min                 Past Medical History:  Diagnosis Date   Allergic rhinitis 08/10/2006   - chronic and stable  - Takes Claritin 10mg  daily as needed        Allergy    Arthritis    Cataract    Chronic pain of right knee 04/27/2018   Depression    Diabetes (HCC) 09/23/2014   GERD (gastroesophageal reflux disease)    per patient    Grief reaction 10/17/2018   Hip pain, acute, right 01/30/2018   Hyperlipemia    Hypertension    Impaired glucose tolerance    Morbidly obese (HCC)    Neck pain on left side 02/02/2023   Obesity, Class III, BMI 40-49.9 (morbid obesity) (HCC) 11/03/2022   Osteoarthritis    Pruritus    Pseudogout    Recurrent major depressive disorder, in full remission (HCC) 11/03/2022   Seasonal allergies    Thyroid nodule    THYROID NODULE 08/10/2006   - lobectomy 08/2003 multinod goiter         Unintended weight loss 01/27/2021   Unintended weight loss 01/27/2021   Past Surgical History:  Procedure Laterality Date   ABDOMINAL HYSTERECTOMY     COLONOSCOPY     KNEE SURGERY     TUMOR REMOVAL     Patient Active Problem List   Diagnosis Date Noted   Neck pain on left side 02/02/2023   Urinary incontinence 11/07/2017   Pseudogout 03/31/2017   Health care maintenance 10/09/2015   Type 2 diabetes mellitus (HCC) 09/23/2014   GERD 04/24/2009   Osteoarthritis 05/29/2007   Hyperlipidemia 08/10/2006   Obesity with body mass index of 30.0-39.9 08/10/2006   Essential hypertension 08/10/2006    PCP: Mercie Eon, MD  REFERRING PROVIDER: Lovie Macadamia, MD,    Inez Catalina, MD     REFERRING DIAG: M54.2 (ICD-10-CM) - Neck pain on left side   THERAPY DIAG:  No diagnosis found.  Rationale for Evaluation and Treatment: Rehabilitation  ONSET DATE: 05/05/23  SUBJECTIVE:  SUBJECTIVE STATEMENT: Patient reporting no worsening of symptoms since last visit. However, she continue to have occasional muscle spasms.    Hand dominance: Right   PERTINENT HISTORY:  PMHx includes osteoarthritis, Depression, type 2 Diabetes, GERD, HTN, obesity, psuedogout   PAIN:  Are you having pain? Yes: NPRS scale: 6/10   Pain location: neck Pain description: ache/aggravating/irritating, muscle twitches Aggravating factors: movement  Relieving factors: Voltaren gel  PRECAUTIONS: None  RED FLAGS: None; Patient denies changes in visual.     WEIGHT BEARING RESTRICTIONS: No  FALLS:  Has patient fallen in last 6 months? No  LIVING ENVIRONMENT: Lives with: lives alone Lives in: House/apartment Stairs: No Has following equipment at home: Single point cane and shower chair  OCCUPATION: n/a  PLOF: Independent  PATIENT GOALS: To have relief of symptoms  NEXT MD VISIT: 09/28/23  OBJECTIVE: (objective measures completed at initial evaluation unless otherwise dated)   DIAGNOSTIC FINDINGS:  No imaging found in patient's chart/physician referral  PATIENT SURVEYS:  FOTO at next visit  06/06/23 28%  COGNITION: Overall cognitive status: Within functional limits for tasks assessed  SENSATION: Not tested  POSTURE: rounded shoulders, forward head, and flexed trunk   PALPATION: Patient has moderate-to-severe tenderness to palpation throughout bilateral cervical and thoracic musculature.  She has moderate muscle tension bilaterally, that is painful with  passive upper trap stretching.   CERVICAL ROM:   Active ROM A/PROM (deg) eval  Flexion 35  Extension 65  Right lateral flexion 20  Left lateral flexion 30  Right rotation 40  Left rotation 45    UPPER EXTREMITY ROM: Grossly within functional limits bilaterally   UPPER EXTREMITY MMT:  MMT Right eval Left eval  Shoulder flexion 5 4+  Shoulder extension    Shoulder abduction 4+ 4+  Shoulder adduction    Shoulder extension    Shoulder internal rotation    Shoulder external rotation    Middle trapezius    Lower trapezius    Elbow flexion 5 5  Elbow extension 5 5  Wrist flexion    Wrist extension    Wrist ulnar deviation    Wrist radial deviation    Wrist pronation    Wrist supination    Grip strength     (Blank rows = not tested)  CERVICAL SPECIAL TESTS:  Not assessed at time of evaluation   TODAY'S TREATMENT:           [facility] Adult PT Treatment:                                                DATE: 07/07/2023  Therapeutic Exercise: Seated Pulley x  Double ER + scapular retraction 2x10 RTBcues for posture and head positioning Shoulder horizontal abduction 2x10 RTB cues for form and pacing Supine fwd chest press, 2.5# bar, 3 x 8 SNAGs Towel assisted cervical rotation x10 each side significant cueing for arm positioning/sequencing and appropriate setup  Seated cervicothoracic chair stretch into extension - 2x10 cues for comfortable positioning/ROM Seated upper trap stretch, 2 x 30 sec each   Manual Therapy:  Suboccipital release, STM bil cs paraspinals, UT, scalenes PROM CS rotation, flexion  Passive UT stretching Grade 2 CPA C3-C6   [facility] Adult PT Treatment:  DATE: 06/30/2023  Therapeutic Exercise: UBE fwd x 2 minutes @ level 2, backward x 2 minutes @ level 1  Double ER + scapular retraction 2x10 GTBcues for posture and head positioning Shoulder horizontal abduction 2x10 RTB cues for form and  pacing Seated fwd chest press, 2kg ball, 2 x 8 SNAGs Towel assisted cervical rotation 2x10 BIL significant cueing for arm positioning/sequencing and appropriate setup  Seated cervicothoracic chair stretch into extension - 2x10 cues for comfortable positioning/ROM Seated upper trap stretch, 3 x 20 sec each         [facility] Adult PT Treatment:                                                DATE: 06/23/23 Therapeutic Exercise: Nu step L3 during subjective  Double ER + scapular retraction 2x10 cues for posture and head positioning Shoulder horizontal abduction 2x10 cues for form and pacing Seated fwd chest press, 1kg ball, x8, x12 cues for posture, pacing, and sitting unsupported  Towel assisted cervical rotation x10 BIL significant cueing for arm positioning/sequencing and appropriate setup  Seated cervicothoracic chair stretch into extension - 2x10 cues for comfortable positioning/ROM Scapular retraction x15 cues for posture, reduced UT compensations HEP update + education/handout   [facility] Adult PT Treatment:                                                DATE: 06/06/23 Therapeutic Exercise: Nustep level 3 x 5 mins SNAGs rotation x10 each Cervical extension over towel 2x10 Seated BIL ER with scap retraction RTB 2x10 Seated horizontal abduction RTB 2x10 Supine alternating shoulder flexion/extension 2x10 Supine chest press 2x10 BIL 500g ball Seated upper trap stretch x30" BIL  Therapeutic Activity: Administration of FOTO 28% - therapist assist with iPad                                                                                     [facility] Adult PT Treatment:                                                DATE: 06/04/2023  Initial evaluation: see patient education and home exercise program as noted below     PATIENT EDUCATION:  Education details: rationale for interventions, HEP   Person educated: Patient Education method: Explanation and Demonstration Education comprehension:  verbalized understanding and needs further education  HOME EXERCISE PROGRAM: Access Code: [email or web address] URL: [email or web address] Date: 06/06/2023 Prepared by: Berta Minor  Exercises - Seated Neck Sidebending Stretch  - 1 x daily - 7 x weekly - 3 sets - 30 seconds hold - Seated Bilateral Shoulder External Rotation with Resistance  - 1 x daily - 7 x weekly - 3 sets - 10 reps  ASSESSMENT:  CLINICAL IMPRESSION:  Patient responded well to manual therapy today, demonstrating some improvement in CS AROM and minimal pain. She also had improved periscapular muscle recruitment with resisted shoulder abduction and ER activities with decreased frequency of verbal cues for correct performance. She will continue to benefit from CS mobility activities, as well as postural endurance at this time.    OBJECTIVE IMPAIRMENTS: decreased activity tolerance, decreased endurance, decreased knowledge of condition, decreased ROM, decreased strength, increased muscle spasms, impaired UE functional use, postural dysfunction, and pain.   ACTIVITY LIMITATIONS: carrying, lifting, sleeping, and hygiene/grooming  PARTICIPATION LIMITATIONS: meal prep, cleaning, laundry, interpersonal relationship, shopping, and community activity  PERSONAL FACTORS: Age, Past/current experiences, and 3+ comorbidities: PMHx includes osteoarthritis, Depression, type 2 Diabetes, GERD, HTN, obesity, psuedogout  are also affecting patient's functional outcome.   REHAB POTENTIAL: Fair    CLINICAL DECISION MAKING: Evolving/moderate complexity  EVALUATION COMPLEXITY: Moderate   GOALS: Goals reviewed with patient? Yes  SHORT TERM GOALS: Target date: 07/02/2023   Patient will be independent with initial home program for cervical muscle flexibility, symptom management/pain modulation activities.  Baseline: provided at eval  Goal status: MET  2.  Patient will demonstrate improved postural awareness for at least 15  minutes while seated without need for cueing from PT.   Baseline: see objective measures  Goal status: PROGRESSING  LONG TERM GOALS: Target date: 07/30/2023   Patient will report improved overall functional ability with FOTO score of TBD.  Baseline: to be assessed at f/u visit  Goal status: INITIAL  2.  Patient will demonstrate improved cervical lateral flexion AROM to at least 35 degrees bilaterally. Baseline:  Active ROM A/PROM (deg) eval  Flexion 35  Extension 65  Right lateral flexion 20  Left lateral flexion 30  Right rotation 40  Left rotation 45   Goal status: INITIAL  3.  Patient will demonstrate improved left shoulder strength, with 5/5 MMT score. Baseline:  MMT Right eval Left eval  Shoulder flexion 5 4+  Shoulder extension    Shoulder abduction 4+ 4+   Goal status: INITIAL  4.  Patient will have no more than minimal cervical muscle tension with palpation and no more than minimal tenderness to palpation bilaterally.  Baseline:  Goal status: INITIAL  5.  Patient will report no recent headaches and the past 2 weeks. Baseline: HA every other week Goal status: INITIAL  6.  Patient will report no limitations with ADLs/IADLs related to neck or left shoulder pain.   Baseline: currently limited by pain Goal status: INITIAL   PLAN:  PT FREQUENCY: 1-2x/week  PT DURATION: 8 weeks  PLANNED INTERVENTIONS: Therapeutic exercises, Therapeutic activity, Neuromuscular re-education, Patient/Family education, Self Care, Joint mobilization, Dry Needling, Electrical stimulation, Spinal manipulation, Spinal mobilization, Cryotherapy, Moist heat, Traction, Manual therapy, and Re-evaluation  PLAN FOR NEXT SESSION: cervical retraction and isometrics, CS stretches, manual therapy as indicated. Postural mobility/endurance. Update/review HEP PRN   Mauri Reading, PT, DPT   07/07/2023 9:15 AM

## 2023-07-14 ENCOUNTER — Ambulatory Visit: Payer: Medicare Other | Attending: Internal Medicine

## 2023-07-14 DIAGNOSIS — M5412 Radiculopathy, cervical region: Secondary | ICD-10-CM | POA: Insufficient documentation

## 2023-07-14 DIAGNOSIS — M542 Cervicalgia: Secondary | ICD-10-CM | POA: Diagnosis not present

## 2023-07-14 DIAGNOSIS — Z23 Encounter for immunization: Secondary | ICD-10-CM | POA: Diagnosis not present

## 2023-07-14 NOTE — Therapy (Signed)
OUTPATIENT PHYSICAL THERAPY TREATMENT NOTE   Patient Name: Christine [name] MRN: [id number] DOB:April 18, 1936, 87 y.o., female Today's Date: 07/14/2023  END OF SESSION:  PT End of Session - 07/14/23 0815     Visit Number 6    Number of Visits 9    Date for PT Re-Evaluation 07/30/23    Authorization Type MCR    Progress Note Due on Visit 10    PT Start Time 0830    PT Stop Time 0909    PT Time Calculation (min) 39 min    Activity Tolerance Patient tolerated treatment well    Behavior During Therapy [facility] for tasks assessed/performed                 Past Medical History:  Diagnosis Date   Allergic rhinitis 08/10/2006   - chronic and stable  - Takes Claritin 10mg  daily as needed        Allergy    Arthritis    Cataract    Chronic pain of right knee 04/27/2018   Depression    Diabetes (HCC) 09/23/2014   GERD (gastroesophageal reflux disease)    per patient    Grief reaction 10/17/2018   Hip pain, acute, right 01/30/2018   Hyperlipemia    Hypertension    Impaired glucose tolerance    Morbidly obese (HCC)    Neck pain on left side 02/02/2023   Obesity, Class III, BMI 40-49.9 (morbid obesity) (HCC) 11/03/2022   Osteoarthritis    Pruritus    Pseudogout    Recurrent major depressive disorder, in full remission (HCC) 11/03/2022   Seasonal allergies    Thyroid nodule    THYROID NODULE 08/10/2006   - lobectomy 08/2003 multinod goiter         Unintended weight loss 01/27/2021   Unintended weight loss 01/27/2021   Past Surgical History:  Procedure Laterality Date   ABDOMINAL HYSTERECTOMY     COLONOSCOPY     KNEE SURGERY     TUMOR REMOVAL     Patient Active Problem List   Diagnosis Date Noted   Neck pain on left side 02/02/2023   Urinary incontinence 11/07/2017   Pseudogout 03/31/2017   Health care maintenance 10/09/2015   Type 2 diabetes mellitus (HCC) 09/23/2014   GERD 04/24/2009   Osteoarthritis 05/29/2007   Hyperlipidemia 08/10/2006   Obesity with body  mass index of 30.0-39.9 08/10/2006   Essential hypertension 08/10/2006    PCP: Mercie Eon, MD  REFERRING PROVIDER: Lovie Macadamia, MD,   Inez Catalina, MD     REFERRING DIAG: M54.2 (ICD-10-CM) - Neck pain on left side   THERAPY DIAG:  Cervicalgia  Radiculopathy, cervical region  Rationale for Evaluation and Treatment: Rehabilitation  ONSET DATE: 05/05/23  SUBJECTIVE:  SUBJECTIVE STATEMENT: Patient is reporting that her neck is feeling better. She reports no more than 1-2/10 pain at start of session.    Hand dominance: Right   PERTINENT HISTORY:  PMHx includes osteoarthritis, Depression, type 2 Diabetes, GERD, HTN, obesity, psuedogout   PAIN:  Are you having pain? Yes: NPRS scale: 6/10   Pain location: neck Pain description: ache/aggravating/irritating, muscle twitches Aggravating factors: movement  Relieving factors: Voltaren gel  PRECAUTIONS: None  RED FLAGS: None; Patient denies changes in visual.     WEIGHT BEARING RESTRICTIONS: No  FALLS:  Has patient fallen in last 6 months? No  LIVING ENVIRONMENT: Lives with: lives alone Lives in: House/apartment Stairs: No Has following equipment at home: Single point cane and shower chair  OCCUPATION: n/a  PLOF: Independent  PATIENT GOALS: To have relief of symptoms  NEXT MD VISIT: 09/28/23  OBJECTIVE: (objective measures completed at initial evaluation unless otherwise dated)   DIAGNOSTIC FINDINGS:  No imaging found in patient's chart/physician referral  PATIENT SURVEYS:  FOTO at next visit  06/06/23 28%  COGNITION: Overall cognitive status: Within functional limits for tasks assessed  SENSATION: Not tested  POSTURE: rounded shoulders, forward head, and flexed trunk   PALPATION: Patient  has moderate-to-severe tenderness to palpation throughout bilateral cervical and thoracic musculature.  She has moderate muscle tension bilaterally, that is painful with passive upper trap stretching.   CERVICAL ROM:   Active ROM A/PROM (deg) eval   Flexion 35   Extension 65   Right lateral flexion 20   Left lateral flexion 30   Right rotation 40   Left rotation 45     UPPER EXTREMITY ROM: Grossly within functional limits bilaterally   UPPER EXTREMITY MMT:  MMT Right eval Left eval  Shoulder flexion 5 4+  Shoulder extension    Shoulder abduction 4+ 4+  Shoulder adduction    Shoulder extension    Shoulder internal rotation    Shoulder external rotation    Middle trapezius    Lower trapezius    Elbow flexion 5 5  Elbow extension 5 5  Wrist flexion    Wrist extension    Wrist ulnar deviation    Wrist radial deviation    Wrist pronation    Wrist supination    Grip strength     (Blank rows = not tested)  CERVICAL SPECIAL TESTS:  Not assessed at time of evaluation   TODAY'S TREATMENT:          [facility] Adult PT Treatment:                                                DATE: 07/14/2023  Therapeutic Exercise: Seated Pulley x  Double ER + scapular retraction 2x10 RTB cues for posture and head positioning Shoulder horizontal abduction 2x10 RTB cues for form and pacing SNAGs Towel assisted cervical rotation x10 each side significant cueing for arm positioning/sequencing and appropriate setup  Seated cervicothoracic chair stretch into extension - x 10 cues for comfortable positioning/ROM Standing shoulder extension x 30 red TB Standing rows 2 x 10 green TB   Therapeutic Activity:   Vitals measured following standing exercises: BP: 135/76, HR: 92 bpm, O2 sat: 98%; Followed by seated rest break and deep breathing cues      [facility] Adult PT Treatment:  DATE: 07/07/2023  Therapeutic Exercise: Seated Pulley x  Double  ER + scapular retraction 2x10 RTBcues for posture and head positioning Shoulder horizontal abduction 2x10 RTB cues for form and pacing Supine fwd chest press, 2.5# bar, 3 x 8 SNAGs Towel assisted cervical rotation x10 each side significant cueing for arm positioning/sequencing and appropriate setup  Seated cervicothoracic chair stretch into extension - 2x10 cues for comfortable positioning/ROM Seated upper trap stretch, 2 x 30 sec each   Manual Therapy:  Suboccipital release, STM bil cs paraspinals, UT, scalenes PROM CS rotation, flexion  Passive UT stretching Grade 2 CPA C3-C6   [facility] Adult PT Treatment:                                                DATE: 06/30/2023  Therapeutic Exercise: UBE fwd x 2 minutes @ level 2, backward x 2 minutes @ level 1  Double ER + scapular retraction 2x10 GTBcues for posture and head positioning Shoulder horizontal abduction 2x10 RTB cues for form and pacing Seated fwd chest press, 2kg ball, 2 x 8 SNAGs Towel assisted cervical rotation 2x10 BIL significant cueing for arm positioning/sequencing and appropriate setup  Seated cervicothoracic chair stretch into extension - 2x10 cues for comfortable positioning/ROM Seated upper trap stretch, 3 x 20 sec each         [facility] Adult PT Treatment:                                                DATE: 06/23/23 Therapeutic Exercise: Nu step L3 during subjective  Double ER + scapular retraction 2x10 cues for posture and head positioning Shoulder horizontal abduction 2x10 cues for form and pacing Seated fwd chest press, 1kg ball, x8, x12 cues for posture, pacing, and sitting unsupported  Towel assisted cervical rotation x10 BIL significant cueing for arm positioning/sequencing and appropriate setup  Seated cervicothoracic chair stretch into extension - 2x10 cues for comfortable positioning/ROM Scapular retraction x15 cues for posture, reduced UT compensations HEP update + education/handout   [facility] Adult PT  Treatment:                                                DATE: 06/06/23 Therapeutic Exercise: Nustep level 3 x 5 mins SNAGs rotation x10 each Cervical extension over towel 2x10 Seated BIL ER with scap retraction RTB 2x10 Seated horizontal abduction RTB 2x10 Supine alternating shoulder flexion/extension 2x10 Supine chest press 2x10 BIL 500g ball Seated upper trap stretch x30" BIL  Therapeutic Activity: Administration of FOTO 28% - therapist assist with iPad                                                                                     [facility] Adult PT Treatment:  DATE: 06/04/2023  Initial evaluation: see patient education and home exercise program as noted below     PATIENT EDUCATION:  Education details: rationale for interventions, HEP   Person educated: Patient Education method: Medical illustrator Education comprehension: verbalized understanding and needs further education  HOME EXERCISE PROGRAM: Access Code: [email or web address] URL: [email or web address] Date: 06/06/2023 Prepared by: Berta Minor  Exercises - Seated Neck Sidebending Stretch  - 1 x daily - 7 x weekly - 3 sets - 30 seconds hold - Seated Bilateral Shoulder External Rotation with Resistance  - 1 x daily - 7 x weekly - 3 sets - 10 reps  ASSESSMENT:  CLINICAL IMPRESSION: Patient reporting "heart racing" following standing exercises, which she recalls occurring after leaving her last session. Vitals measured at that time were as follows: BP: 135/76, HR: 92 bpm, O2 sat: 98%. Instructed patient in deep breathing and seated rest break. She is otherwise able to perform exercises with increased resistance and decreased frequency of cueing for performance. We will continue as tolerated.     OBJECTIVE IMPAIRMENTS: decreased activity tolerance, decreased endurance, decreased knowledge of condition, decreased ROM, decreased strength, increased muscle  spasms, impaired UE functional use, postural dysfunction, and pain.   ACTIVITY LIMITATIONS: carrying, lifting, sleeping, and hygiene/grooming  PARTICIPATION LIMITATIONS: meal prep, cleaning, laundry, interpersonal relationship, shopping, and community activity  PERSONAL FACTORS: Age, Past/current experiences, and 3+ comorbidities: PMHx includes osteoarthritis, Depression, type 2 Diabetes, GERD, HTN, obesity, psuedogout  are also affecting patient's functional outcome.   REHAB POTENTIAL: Fair    CLINICAL DECISION MAKING: Evolving/moderate complexity  EVALUATION COMPLEXITY: Moderate   GOALS: Goals reviewed with patient? Yes  SHORT TERM GOALS: Target date: 07/02/2023   Patient will be independent with initial home program for cervical muscle flexibility, symptom management/pain modulation activities.  Baseline: provided at eval  Goal status: MET  2.  Patient will demonstrate improved postural awareness for at least 15 minutes while seated without need for cueing from PT.   Baseline: see objective measures  Goal status: PROGRESSING  LONG TERM GOALS: Target date: 07/30/2023   Patient will report improved overall functional ability with FOTO score of TBD.  Baseline: to be assessed at f/u visit  Goal status: INITIAL  2.  Patient will demonstrate improved cervical lateral flexion AROM to at least 35 degrees bilaterally. Baseline:  Active ROM A/PROM (deg) eval  Flexion 35  Extension 65  Right lateral flexion 20  Left lateral flexion 30  Right rotation 40  Left rotation 45   Goal status: INITIAL  3.  Patient will demonstrate improved left shoulder strength, with 5/5 MMT score. Baseline:  MMT Right eval Left eval  Shoulder flexion 5 4+  Shoulder extension    Shoulder abduction 4+ 4+   Goal status: INITIAL  4.  Patient will have no more than minimal cervical muscle tension with palpation and no more than minimal tenderness to palpation bilaterally.  Baseline:  Goal  status: INITIAL  5.  Patient will report no recent headaches and the past 2 weeks. Baseline: HA every other week Goal status: INITIAL  6.  Patient will report no limitations with ADLs/IADLs related to neck or left shoulder pain.   Baseline: currently limited by pain Goal status: INITIAL   PLAN:  PT FREQUENCY: 1-2x/week  PT DURATION: 8 weeks  PLANNED INTERVENTIONS: Therapeutic exercises, Therapeutic activity, Neuromuscular re-education, Patient/Family education, Self Care, Joint mobilization, Dry Needling, Electrical stimulation, Spinal manipulation, Spinal mobilization, Cryotherapy, Moist heat, Traction, Manual therapy, and Re-evaluation  PLAN FOR NEXT  SESSION: cervical retraction and isometrics, CS stretches, manual therapy as indicated. Postural mobility/endurance. Update/review HEP PRN   Mauri Reading, PT, DPT   07/14/2023 10:04 AM

## 2023-07-21 ENCOUNTER — Other Ambulatory Visit: Payer: Self-pay | Admitting: Internal Medicine

## 2023-07-21 ENCOUNTER — Ambulatory Visit: Payer: Medicare Other

## 2023-07-21 DIAGNOSIS — M542 Cervicalgia: Secondary | ICD-10-CM

## 2023-07-21 DIAGNOSIS — J309 Allergic rhinitis, unspecified: Secondary | ICD-10-CM

## 2023-07-21 DIAGNOSIS — M5412 Radiculopathy, cervical region: Secondary | ICD-10-CM

## 2023-07-21 DIAGNOSIS — E7849 Other hyperlipidemia: Secondary | ICD-10-CM

## 2023-07-21 MED ORDER — LORATADINE 10 MG PO TABS
ORAL_TABLET | ORAL | 1 refills | Status: DC
Start: 2023-07-21 — End: 2024-01-30

## 2023-07-21 MED ORDER — ATORVASTATIN CALCIUM 40 MG PO TABS: 40.0000 mg | ORAL_TABLET | Freq: Every day | ORAL | 3 refills | Status: DC

## 2023-07-21 NOTE — Therapy (Signed)
OUTPATIENT PHYSICAL THERAPY TREATMENT NOTE   Patient Name: Christine [name] MRN: [id number] DOB:01/16/1936, 87 y.o., female Today's Date: 07/21/2023  END OF SESSION:  PT End of Session - 07/21/23 0830     Visit Number 7    Number of Visits 9    Date for PT Re-Evaluation 07/30/23    Authorization Type MCR    PT Start Time 0830    PT Stop Time 0905    PT Time Calculation (min) 35 min    Activity Tolerance Patient tolerated treatment well    Behavior During Therapy [facility] for tasks assessed/performed                  Past Medical History:  Diagnosis Date   Allergic rhinitis 08/10/2006   - chronic and stable  - Takes Claritin 10mg  daily as needed        Allergy    Arthritis    Cataract    Chronic pain of right knee 04/27/2018   Depression    Diabetes (HCC) 09/23/2014   GERD (gastroesophageal reflux disease)    per patient    Grief reaction 10/17/2018   Hip pain, acute, right 01/30/2018   Hyperlipemia    Hypertension    Impaired glucose tolerance    Morbidly obese (HCC)    Neck pain on left side 02/02/2023   Obesity, Class III, BMI 40-49.9 (morbid obesity) (HCC) 11/03/2022   Osteoarthritis    Pruritus    Pseudogout    Recurrent major depressive disorder, in full remission (HCC) 11/03/2022   Seasonal allergies    Thyroid nodule    THYROID NODULE 08/10/2006   - lobectomy 08/2003 multinod goiter         Unintended weight loss 01/27/2021   Unintended weight loss 01/27/2021   Past Surgical History:  Procedure Laterality Date   ABDOMINAL HYSTERECTOMY     COLONOSCOPY     KNEE SURGERY     TUMOR REMOVAL     Patient Active Problem List   Diagnosis Date Noted   Neck pain on left side 02/02/2023   Urinary incontinence 11/07/2017   Pseudogout 03/31/2017   Health care maintenance 10/09/2015   Type 2 diabetes mellitus (HCC) 09/23/2014   GERD 04/24/2009   Osteoarthritis 05/29/2007   Hyperlipidemia 08/10/2006   Obesity with body mass index of 30.0-39.9  08/10/2006   Essential hypertension 08/10/2006    PCP: Mercie Eon, MD  REFERRING PROVIDER: Lovie Macadamia, MD,   Inez Catalina, MD     REFERRING DIAG: M54.2 (ICD-10-CM) - Neck pain on left side   THERAPY DIAG:  Cervicalgia  Radiculopathy, cervical region  Rationale for Evaluation and Treatment: Rehabilitation  ONSET DATE: 05/05/23  SUBJECTIVE:  SUBJECTIVE STATEMENT: Patient is reporting that she has noticed good improvement in her neck pain since starting PT. She feels that she'll be ready for discharge at next visit.    Hand dominance: Right   PERTINENT HISTORY:  PMHx includes osteoarthritis, Depression, type 2 Diabetes, GERD, HTN, obesity, psuedogout   PAIN:  Are you having pain? Yes: NPRS scale: 6/10   Pain location: neck Pain description: ache/aggravating/irritating, muscle twitches Aggravating factors: movement  Relieving factors: Voltaren gel  PRECAUTIONS: None  RED FLAGS: None; Patient denies changes in visual.     WEIGHT BEARING RESTRICTIONS: No  FALLS:  Has patient fallen in last 6 months? No  LIVING ENVIRONMENT: Lives with: lives alone Lives in: House/apartment Stairs: No Has following equipment at home: Single point cane and shower chair  OCCUPATION: n/a  PLOF: Independent  PATIENT GOALS: To have relief of symptoms  NEXT MD VISIT: 09/28/23  OBJECTIVE: (objective measures completed at initial evaluation unless otherwise dated)   DIAGNOSTIC FINDINGS:  No imaging found in patient's chart/physician referral  PATIENT SURVEYS:  FOTO at next visit  06/06/23 28%  COGNITION: Overall cognitive status: Within functional limits for tasks assessed  SENSATION: Not tested  POSTURE: rounded shoulders, forward head, and flexed trunk    PALPATION: Patient has moderate-to-severe tenderness to palpation throughout bilateral cervical and thoracic musculature.  She has moderate muscle tension bilaterally, that is painful with passive upper trap stretching.   CERVICAL ROM:   Active ROM A/PROM (deg) eval   Flexion 35   Extension 65   Right lateral flexion 20   Left lateral flexion 30   Right rotation 40   Left rotation 45     UPPER EXTREMITY ROM: Grossly within functional limits bilaterally   UPPER EXTREMITY MMT:  MMT Right eval Left eval  Shoulder flexion 5 4+  Shoulder extension    Shoulder abduction 4+ 4+  Shoulder adduction    Shoulder extension    Shoulder internal rotation    Shoulder external rotation    Middle trapezius    Lower trapezius    Elbow flexion 5 5  Elbow extension 5 5  Wrist flexion    Wrist extension    Wrist ulnar deviation    Wrist radial deviation    Wrist pronation    Wrist supination    Grip strength     (Blank rows = not tested)  CERVICAL SPECIAL TESTS:  Not assessed at time of evaluation   TODAY'S TREATMENT:          [facility] Adult PT Treatment:                                                DATE: 07/21/2023  Therapeutic Exercise: UBE level 2, fwd/back x 3 min each  Double ER + scapular retraction x 30 green TB cues for posture and head positioning Shoulder horizontal abduction x 30  RTB cues for form and pacing Standing shoulder extension x 30 green TB Standing rows x 30 green TB  Seated shoulder flexion 2# x 10  Seated shoulder scaption 2# x 10  SNAGs Towel assisted cervical rotation x15 each side significant cueing for arm positioning/sequencing and appropriate setup  Seated cervicothoracic chair stretch into extension - x 15 cues for comfortable positioning/ROM Seated chin tucks, 2 x 10     [facility] Adult PT Treatment:  DATE: 07/14/2023  Therapeutic Exercise: Seated Pulley x  Double ER + scapular retraction  2x10 RTB cues for posture and head positioning Shoulder horizontal abduction 2x10 RTB cues for form and pacing SNAGs Towel assisted cervical rotation x10 each side significant cueing for arm positioning/sequencing and appropriate setup  Seated cervicothoracic chair stretch into extension - x 10 cues for comfortable positioning/ROM Standing shoulder extension x 30 red TB Standing rows 2 x 10 green TB   Therapeutic Activity:   Vitals measured following standing exercises: BP: 135/76, HR: 92 bpm, O2 sat: 98%; Followed by seated rest break and deep breathing cues      [facility] Adult PT Treatment:                                                DATE: 07/07/2023  Therapeutic Exercise: Seated Pulley x  Double ER + scapular retraction 2x10 RTBcues for posture and head positioning Shoulder horizontal abduction 2x10 RTB cues for form and pacing Supine fwd chest press, 2.5# bar, 3 x 8 SNAGs Towel assisted cervical rotation x10 each side significant cueing for arm positioning/sequencing and appropriate setup  Seated cervicothoracic chair stretch into extension - 2x10 cues for comfortable positioning/ROM Seated upper trap stretch, 2 x 30 sec each   Manual Therapy:  Suboccipital release, STM bil cs paraspinals, UT, scalenes PROM CS rotation, flexion  Passive UT stretching Grade 2 CPA C3-C6    PATIENT EDUCATION:  Education details: rationale for interventions, HEP   Person educated: Patient Education method: Medical illustrator Education comprehension: verbalized understanding and needs further education  HOME EXERCISE PROGRAM: Access Code: [email or web address] URL: [email or web address] Date: 07/21/2023 Prepared by: Mauri Reading  Exercises - Seated Neck Sidebending Stretch  - 1 x daily - 7 x weekly - 3 sets - 30 seconds hold - Seated Bilateral Shoulder External Rotation with Resistance  - 1 x daily - 7 x weekly - 3 sets - 10 reps - Seated Shoulder Horizontal Abduction  with Resistance  - 1 x daily - 7 x weekly - 3 sets - 10 reps - Seated Assisted Cervical Rotation with Towel  - 1 x daily - 7 x weekly - 2 sets - 10 reps - Cervical Extension AROM with Strap  - 1 x daily - 7 x weekly - 2 sets - 10 reps  ASSESSMENT:  CLINICAL IMPRESSION:  Ms. Screen continues to tolerate increased resistance and repetitions of UE and periscapular strengthening program. She requires tactile cueing for correct performance of SNAGs. Updated home exercise program and encourage patient to practice these at home. Plan is to reassess progress towards goals and discharged at next visit.    OBJECTIVE IMPAIRMENTS: decreased activity tolerance, decreased endurance, decreased knowledge of condition, decreased ROM, decreased strength, increased muscle spasms, impaired UE functional use, postural dysfunction, and pain.   ACTIVITY LIMITATIONS: carrying, lifting, sleeping, and hygiene/grooming  PARTICIPATION LIMITATIONS: meal prep, cleaning, laundry, interpersonal relationship, shopping, and community activity  PERSONAL FACTORS: Age, Past/current experiences, and 3+ comorbidities: PMHx includes osteoarthritis, Depression, type 2 Diabetes, GERD, HTN, obesity, psuedogout  are also affecting patient's functional outcome.   REHAB POTENTIAL: Fair    CLINICAL DECISION MAKING: Evolving/moderate complexity  EVALUATION COMPLEXITY: Moderate   GOALS: Goals reviewed with patient? Yes  SHORT TERM GOALS: Target date: 07/02/2023   Patient will be independent with initial home program for cervical muscle  flexibility, symptom management/pain modulation activities.  Baseline: provided at eval  Goal status: MET  2.  Patient will demonstrate improved postural awareness for at least 15 minutes while seated without need for cueing from PT.   Baseline: see objective measures  Goal status: PROGRESSING  LONG TERM GOALS: Target date: 07/30/2023   Patient will report improved overall functional ability  with FOTO score of TBD.  Baseline: to be assessed at f/u visit  Goal status: INITIAL  2.  Patient will demonstrate improved cervical lateral flexion AROM to at least 35 degrees bilaterally. Baseline:  Active ROM A/PROM (deg) eval  Flexion 35  Extension 65  Right lateral flexion 20  Left lateral flexion 30  Right rotation 40  Left rotation 45   Goal status: INITIAL  3.  Patient will demonstrate improved left shoulder strength, with 5/5 MMT score. Baseline:  MMT Right eval Left eval  Shoulder flexion 5 4+  Shoulder extension    Shoulder abduction 4+ 4+   Goal status: INITIAL  4.  Patient will have no more than minimal cervical muscle tension with palpation and no more than minimal tenderness to palpation bilaterally.  Baseline:  Goal status: INITIAL  5.  Patient will report no recent headaches and the past 2 weeks. Baseline: HA every other week Goal status: INITIAL  6.  Patient will report no limitations with ADLs/IADLs related to neck or left shoulder pain.   Baseline: currently limited by pain Goal status: INITIAL   PLAN:  PT FREQUENCY: 1-2x/week  PT DURATION: 8 weeks  PLANNED INTERVENTIONS: Therapeutic exercises, Therapeutic activity, Neuromuscular re-education, Patient/Family education, Self Care, Joint mobilization, Dry Needling, Electrical stimulation, Spinal manipulation, Spinal mobilization, Cryotherapy, Moist heat, Traction, Manual therapy, and Re-evaluation  PLAN FOR NEXT SESSION: cervical retraction and isometrics, CS stretches, manual therapy as indicated. Postural mobility/endurance. Update/review HEP PRN   Mauri Reading, PT, DPT   07/21/2023 9:13 AM

## 2023-07-21 NOTE — Telephone Encounter (Signed)
  loratadine (CLARITIN) 10 MG tablet   atorvastatin (LIPITOR) 40 MG tablet  [id number] - [place], [place] - [address]

## 2023-07-28 ENCOUNTER — Ambulatory Visit: Payer: Medicare Other

## 2023-07-28 DIAGNOSIS — M5412 Radiculopathy, cervical region: Secondary | ICD-10-CM

## 2023-07-28 DIAGNOSIS — M542 Cervicalgia: Secondary | ICD-10-CM

## 2023-07-28 NOTE — Therapy (Signed)
OUTPATIENT PHYSICAL THERAPY TREATMENT NOTE & DISCHARGE SUMMARY    Patient Name: Christine [name] MRN: [id number] DOB:1936-06-10, 87 y.o., female Today's Date: 07/28/2023    PHYSICAL THERAPY DISCHARGE SUMMARY  Visits from Start of Care: 8   Current functional level related to goals / functional outcomes: See objective findings/assessment  Remaining deficits: See objective findings/assessment    Education / Equipment: See today's treatment/assessment      Patient agrees to discharge. Patient goals were partially met. Patient is being discharged due to being pleased with the current functional level.     END OF SESSION:  PT End of Session - 07/28/23 0826     Visit Number 8    Number of Visits 9    Date for PT Re-Evaluation 07/30/23    Authorization Type MCR    PT Start Time 0830    PT Stop Time 0902    PT Time Calculation (min) 32 min    Activity Tolerance Patient tolerated treatment well    Behavior During Therapy WFL for tasks assessed/performed                  Past Medical History:  Diagnosis Date   Allergic rhinitis 08/10/2006   - chronic and stable  - Takes Claritin 10mg  daily as needed        Allergy    Arthritis    Cataract    Chronic pain of right knee 04/27/2018   Depression    Diabetes (HCC) 09/23/2014   GERD (gastroesophageal reflux disease)    per patient    Grief reaction 10/17/2018   Hip pain, acute, right 01/30/2018   Hyperlipemia    Hypertension    Impaired glucose tolerance    Morbidly obese (HCC)    Neck pain on left side 02/02/2023   Obesity, Class III, BMI 40-49.9 (morbid obesity) (HCC) 11/03/2022   Osteoarthritis    Pruritus    Pseudogout    Recurrent major depressive disorder, in full remission (HCC) 11/03/2022   Seasonal allergies    Thyroid nodule    THYROID NODULE 08/10/2006   - lobectomy 08/2003 multinod goiter         Unintended weight loss 01/27/2021   Unintended weight loss 01/27/2021   Past Surgical History:   Procedure Laterality Date   ABDOMINAL HYSTERECTOMY     COLONOSCOPY     KNEE SURGERY     TUMOR REMOVAL     Patient Active Problem List   Diagnosis Date Noted   Neck pain on left side 02/02/2023   Urinary incontinence 11/07/2017   Pseudogout 03/31/2017   Health care maintenance 10/09/2015   Type 2 diabetes mellitus (HCC) 09/23/2014   GERD 04/24/2009   Osteoarthritis 05/29/2007   Hyperlipidemia 08/10/2006   Obesity with body mass index of 30.0-39.9 08/10/2006   Essential hypertension 08/10/2006    PCP: Mercie Eon, MD  REFERRING PROVIDER: Lovie Macadamia, MD,   Inez Catalina, MD     REFERRING DIAG: M54.2 (ICD-10-CM) - Neck pain on left side   THERAPY DIAG:  Cervicalgia  Radiculopathy, cervical region  Rationale for Evaluation and Treatment: Rehabilitation  ONSET DATE: 05/05/23  SUBJECTIVE:  SUBJECTIVE STATEMENT: Patient is reporting good improvement with her neck pain. She states that she is able to move her head with less pain, she has about 1 headache a week which improves following prescribed home exercises. She indicates readiness for discharge from PT today.    Hand dominance: Right   PERTINENT HISTORY:  PMHx includes osteoarthritis, Depression, type 2 Diabetes, GERD, HTN, obesity, psuedogout   PAIN:  Are you having pain? Yes: NPRS scale: 6/10   Pain location: neck Pain description: ache/aggravating/irritating, muscle twitches Aggravating factors: movement  Relieving factors: Voltaren gel  PRECAUTIONS: None  RED FLAGS: None; Patient denies changes in visual.     WEIGHT BEARING RESTRICTIONS: No  FALLS:  Has patient fallen in last 6 months? No  LIVING ENVIRONMENT: Lives with: lives alone Lives in: House/apartment Stairs: No Has following  equipment at home: Single point cane and shower chair  OCCUPATION: n/a  PLOF: Independent  PATIENT GOALS: To have relief of symptoms  NEXT MD VISIT: 09/28/23  OBJECTIVE: (objective measures completed at initial evaluation unless otherwise dated)   DIAGNOSTIC FINDINGS:  No imaging found in patient's chart/physician referral  PATIENT SURVEYS:  FOTO at next visit  06/06/23 28%  COGNITION: Overall cognitive status: Within functional limits for tasks assessed  SENSATION: Not tested  POSTURE: rounded shoulders, forward head, and flexed trunk   PALPATION: Patient has moderate-to-severe tenderness to palpation throughout bilateral cervical and thoracic musculature.  She has moderate muscle tension bilaterally, that is painful with passive upper trap stretching.   CERVICAL ROM:   Active ROM A/PROM (deg) eval AROM 07/28/23  Flexion 35 50  Extension 65 50  Right lateral flexion 20 30  Left lateral flexion 30 40  Right rotation 40 50  Left rotation 45 60    UPPER EXTREMITY ROM: Grossly within functional limits bilaterally   UPPER EXTREMITY MMT:  MMT Right eval Left eval Right 07/28/23 Left 07/28/23  Shoulder flexion 5 4+ 5 4+  Shoulder extension      Shoulder abduction 4+ 4+ 4+ 5  Shoulder adduction      Shoulder extension      Shoulder internal rotation      Shoulder external rotation      Middle trapezius      Lower trapezius      Elbow flexion 5 5 5 5   Elbow extension 5 5 5 5   Wrist flexion      Wrist extension      Wrist ulnar deviation      Wrist radial deviation      Wrist pronation      Wrist supination      Grip strength       (Blank rows = not tested)  CERVICAL SPECIAL TESTS:  Not assessed at time of evaluation   TODAY'S TREATMENT:          [facility] Adult PT Treatment:                                                DATE: 07/28/2023  Therapeutic Exercise: UBE level 2, fwd/back x 3 min each  Shoulder rolls x 15  Updated and reviewed HEP    Manual Therapy:  STM BIL UT, levator scap x 5 minutes   Therapeutic Activity:  Reassessment of objective measures and subjective assessment regarding progress towards established goals and plan for independence with prescribed  home program following discharged from PT       PATIENT EDUCATION:  Education details: rationale for interventions, HEP   Person educated: Patient Education method: Medical illustrator Education comprehension: verbalized understanding and needs further education  HOME EXERCISE PROGRAM: Access Code: [email or web address] URL: [email or web address] Date: 07/21/2023 Prepared by: Mauri Reading  Exercises - Seated Neck Sidebending Stretch  - 1 x daily - 7 x weekly - 3 sets - 30 seconds hold - Seated Bilateral Shoulder External Rotation with Resistance  - 1 x daily - 7 x weekly - 3 sets - 10 reps - Seated Shoulder Horizontal Abduction with Resistance  - 1 x daily - 7 x weekly - 3 sets - 10 reps - Seated Assisted Cervical Rotation with Towel  - 1 x daily - 7 x weekly - 2 sets - 10 reps - Cervical Extension AROM with Strap  - 1 x daily - 7 x weekly - 2 sets - 10 reps  ASSESSMENT:  CLINICAL IMPRESSION:  Patient has attended 8 total PT session to address neck pain. She is demonstrating significant improvement in CS AROM, BIL shoulder strength, and overall postural endurance. She also reports improved pain severity and improved independence with pain modulation. Updated home exercises program with focus on recommended frequency for maintenance and pain modulation. She will be discharged from skilled PT at this time.    OBJECTIVE IMPAIRMENTS: decreased activity tolerance, decreased endurance, decreased knowledge of condition, decreased ROM, decreased strength, increased muscle spasms, impaired UE functional use, postural dysfunction, and pain.   ACTIVITY LIMITATIONS: carrying, lifting, sleeping, and hygiene/grooming  PARTICIPATION LIMITATIONS: meal  prep, cleaning, laundry, interpersonal relationship, shopping, and community activity  PERSONAL FACTORS: Age, Past/current experiences, and 3+ comorbidities: PMHx includes osteoarthritis, Depression, type 2 Diabetes, GERD, HTN, obesity, psuedogout  are also affecting patient's functional outcome.   REHAB POTENTIAL: Fair    CLINICAL DECISION MAKING: Evolving/moderate complexity  EVALUATION COMPLEXITY: Moderate   GOALS: Goals reviewed with patient? Yes  SHORT TERM GOALS: Target date: 07/02/2023   Patient will be independent with initial home program for cervical muscle flexibility, symptom management/pain modulation activities.  Baseline: provided at eval  Goal status: MET  2.  Patient will demonstrate improved postural awareness for at least 15 minutes while seated without need for cueing from PT.   Baseline: see objective measures  Goal status: MET  LONG TERM GOALS: Target date: 07/30/2023   Patient will report improved overall functional ability with FOTO score of 45.  Baseline: 28 on 05/26/23 07/28/23: 50  Goal status: MET  2.  Patient will demonstrate improved cervical lateral flexion AROM to at least 35 degrees bilaterally. Baseline:  Active ROM A/PROM (deg) eval AROM 07/28/23  Flexion 35 50  Extension 65 50  Right lateral flexion 20 30  Left lateral flexion 30 40  Right rotation 40 50  Left rotation 45 60   Goal status: Partially MET  3.  Patient will demonstrate improved left shoulder strength, with 5/5 MMT score. Baseline:  MMT Right eval Left eval Right 07/28/23 Left 07/28/23  Shoulder flexion 5 4+ 5 4+  Shoulder abduction 4+ 4+ 4+ 5  Elbow flexion 5 5 5 5   Elbow extension 5 5 5 5    Goal status: Partially MET  4.  Patient will have no more than minimal cervical muscle tension with palpation and no more than minimal tenderness to palpation bilaterally.  Baseline: See objective measures. Goal status: MET  5.  Patient will report no recent headaches  and the  past 2 weeks. Baseline: HA every other week 07/28/23: once a week, briefly which improves with exercises  Goal status: Partially MET  6.  Patient will report no limitations with ADLs/IADLs related to neck or left shoulder pain.   Baseline: currently limited by pain Goal status: Partially MET    PLAN:  PT FREQUENCY: 1-2x/week  PT DURATION: 8 weeks  PLANNED INTERVENTIONS: Therapeutic exercises, Therapeutic activity, Neuromuscular re-education, Patient/Family education, Self Care, Joint mobilization, Dry Needling, Electrical stimulation, Spinal manipulation, Spinal mobilization, Cryotherapy, Moist heat, Traction, Manual therapy, and Re-evaluation  PLAN FOR NEXT SESSION: cervical retraction and isometrics, CS stretches, manual therapy as indicated. Postural mobility/endurance. Update/review HEP PRN   Mauri Reading, PT, DPT   07/28/2023 9:12 AM

## 2023-08-02 ENCOUNTER — Other Ambulatory Visit: Payer: Self-pay | Admitting: Physician Assistant

## 2023-08-02 NOTE — Telephone Encounter (Signed)
Last Fill: 03/24/2023  Labs: 03/24/2023 CBC WNL Glucose is 133. Total protein is borderline elevated. Rest of CMP WNL.    Next Visit: 09/28/2023  Last Visit: 03/24/2023  DX: Pseudogout   Current Dose per office note 03/24/2023: colchicine 0.6 mg 1 tablet by mouth daily on a as needed basis during flares   Okay to refill Colchicine?
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
## 2023-09-28 ENCOUNTER — Encounter: Payer: Self-pay | Admitting: Rheumatology

## 2023-09-28 ENCOUNTER — Ambulatory Visit: Payer: Medicare Other | Attending: Rheumatology | Admitting: Rheumatology

## 2023-09-28 VITALS — BP 122/61 | HR 77 | Resp 16 | Ht 68.0 in | Wt 238.2 lb

## 2023-09-28 DIAGNOSIS — Z8719 Personal history of other diseases of the digestive system: Secondary | ICD-10-CM | POA: Diagnosis not present

## 2023-09-28 DIAGNOSIS — M19042 Primary osteoarthritis, left hand: Secondary | ICD-10-CM | POA: Diagnosis not present

## 2023-09-28 DIAGNOSIS — R7303 Prediabetes: Secondary | ICD-10-CM

## 2023-09-28 DIAGNOSIS — M1711 Unilateral primary osteoarthritis, right knee: Secondary | ICD-10-CM | POA: Diagnosis not present

## 2023-09-28 DIAGNOSIS — M25511 Pain in right shoulder: Secondary | ICD-10-CM | POA: Insufficient documentation

## 2023-09-28 DIAGNOSIS — M19041 Primary osteoarthritis, right hand: Secondary | ICD-10-CM | POA: Diagnosis not present

## 2023-09-28 DIAGNOSIS — M1120 Other chondrocalcinosis, unspecified site: Secondary | ICD-10-CM

## 2023-09-28 DIAGNOSIS — G8929 Other chronic pain: Secondary | ICD-10-CM | POA: Diagnosis not present

## 2023-09-28 DIAGNOSIS — L409 Psoriasis, unspecified: Secondary | ICD-10-CM | POA: Diagnosis not present

## 2023-09-28 DIAGNOSIS — M160 Bilateral primary osteoarthritis of hip: Secondary | ICD-10-CM | POA: Diagnosis not present

## 2023-09-28 DIAGNOSIS — M7061 Trochanteric bursitis, right hip: Secondary | ICD-10-CM | POA: Diagnosis not present

## 2023-09-28 DIAGNOSIS — Z8679 Personal history of other diseases of the circulatory system: Secondary | ICD-10-CM

## 2023-09-28 DIAGNOSIS — Z8639 Personal history of other endocrine, nutritional and metabolic disease: Secondary | ICD-10-CM | POA: Diagnosis not present

## 2023-09-28 MED ORDER — DICLOFENAC SODIUM 1 % EX GEL: CUTANEOUS | 4 refills | Status: DC

## 2023-09-28 NOTE — Patient Instructions (Signed)
Shoulder Exercises Ask your health care provider which exercises are safe for you. Do exercises exactly as told by your health care provider and adjust them as directed. It is normal to feel mild stretching, pulling, tightness, or discomfort as you do these exercises. Stop right away if you feel sudden pain or your pain gets worse. Do not begin these exercises until told by your health care provider. Stretching exercises External rotation and abduction This exercise is sometimes called corner stretch. The exercise rotates your arm outward (external rotation) and moves your arm out from your body (abduction). Stand in a doorway with one of your feet slightly in front of the other. This is called a staggered stance. If you cannot reach your forearms to the door frame, stand facing a corner of a room. Choose one of the following positions as told by your health care provider: Place your hands and forearms on the door frame above your head. Place your hands and forearms on the door frame at the height of your head. Place your hands on the door frame at the height of your elbows. Slowly move your weight onto your front foot until you feel a stretch across your chest and in the front of your shoulders. Keep your head and chest upright and keep your abdominal muscles tight. Hold for __________ seconds. To release the stretch, shift your weight to your back foot. Repeat __________ times. Complete this exercise __________ times a day. Extension, standing  Stand and hold a broomstick, a cane, or a similar object behind your back. Your hands should be a little wider than shoulder-width apart. Your palms should face away from your back. Keeping your elbows straight and your shoulder muscles relaxed, move the stick away from your body until you feel a stretch in your shoulders (extension). Avoid shrugging your shoulders while you move the stick. Keep your shoulder blades tucked down toward the middle of your  back. Hold for __________ seconds. Slowly return to the starting position. Repeat __________ times. Complete this exercise __________ times a day. Range-of-motion exercises Pendulum  Stand near a wall or a surface that you can hold onto for balance. Bend at the waist and let your left / right arm hang straight down. Use your other arm to support you. Keep your back straight and do not lock your knees. Relax your left / right arm and shoulder muscles, and move your hips and your trunk so your left / right arm swings freely. Your arm should swing because of the motion of your body, not because you are using your arm or shoulder muscles. Keep moving your hips and trunk so your arm swings in the following directions, as told by your health care provider: Side to side. Forward and backward. In clockwise and counterclockwise circles. Continue each motion for __________ seconds, or for as long as told by your health care provider. Slowly return to the starting position. Repeat __________ times. Complete this exercise __________ times a day. Shoulder flexion, standing  Stand and hold a broomstick, a cane, or a similar object. Place your hands a little more than shoulder-width apart on the object. Your left / right hand should be palm-up, and your other hand should be palm-down. Keep your elbow straight and your shoulder muscles relaxed. Push the stick up with your healthy arm to raise your left / right arm in front of your body, and then over your head until you feel a stretch in your shoulder (flexion). Avoid shrugging your shoulder   while you raise your arm. Keep your shoulder blade tucked down toward the middle of your back. Hold for __________ seconds. Slowly return to the starting position. Repeat __________ times. Complete this exercise __________ times a day. Shoulder abduction, standing  Stand and hold a broomstick, a cane, or a similar object. Place your hands a little more than  shoulder-width apart on the object. Your left / right hand should be palm-up, and your other hand should be palm-down. Keep your elbow straight and your shoulder muscles relaxed. Push the object across your body toward your left / right side. Raise your left / right arm to the side of your body (abduction) until you feel a stretch in your shoulder. Do not raise your arm above shoulder height unless your health care provider tells you to do that. If directed, raise your arm over your head. Avoid shrugging your shoulder while you raise your arm. Keep your shoulder blade tucked down toward the middle of your back. Hold for __________ seconds. Slowly return to the starting position. Repeat __________ times. Complete this exercise __________ times a day. Internal rotation  Place your left / right hand behind your back, palm-up. Use your other hand to dangle an exercise band, a broomstick, or a similar object over your shoulder. Grasp the band with your left / right hand so you are holding on to both ends. Gently pull up on the band until you feel a stretch in the front of your left / right shoulder. The movement of your arm toward the center of your body is called internal rotation. Avoid shrugging your shoulder while you raise your arm. Keep your shoulder blade tucked down toward the middle of your back. Hold for __________ seconds. Release the stretch by letting go of the band and lowering your hands. Repeat __________ times. Complete this exercise __________ times a day. Strengthening exercises External rotation  Sit in a stable chair without armrests. Secure an exercise band to a stable object at elbow height on your left / right side. Place a soft object, such as a folded towel or a small pillow, between your left / right upper arm and your body to move your elbow about 4 inches (10 cm) away from your side. Hold the end of the exercise band so it is tight and there is no slack. Keeping your  elbow pressed against the soft object, slowly move your forearm out, away from your abdomen (external rotation). Keep your body steady so only your forearm moves. Hold for __________ seconds. Slowly return to the starting position. Repeat __________ times. Complete this exercise __________ times a day. Shoulder abduction  Sit in a stable chair without armrests, or stand up. Hold a __________ lb / kg weight in your left / right hand, or hold an exercise band with both hands. Start with your arms straight down and your left / right palm facing in, toward your body. Slowly lift your left / right hand out to your side (abduction). Do not lift your hand above shoulder height unless your health care provider tells you that this is safe. Keep your arms straight. Avoid shrugging your shoulder while you do this movement. Keep your shoulder blade tucked down toward the middle of your back. Hold for __________ seconds. Slowly lower your arm, and return to the starting position. Repeat __________ times. Complete this exercise __________ times a day. Shoulder extension  Sit in a stable chair without armrests, or stand up. Secure an exercise band to a   stable object in front of you so it is at shoulder height. Hold one end of the exercise band in each hand. Straighten your elbows and lift your hands up to shoulder height. Squeeze your shoulder blades together as you pull your hands down to the sides of your thighs (extension). Stop when your hands are straight down by your sides. Do not let your hands go behind your body. Hold for __________ seconds. Slowly return to the starting position. Repeat __________ times. Complete this exercise __________ times a day. Shoulder row  Sit in a stable chair without armrests, or stand up. Secure an exercise band to a stable object in front of you so it is at chest height. Hold one end of the exercise band in each hand. Position your palms so that your thumbs are  facing the ceiling (neutral position). Bend each of your elbows to a 90-degree angle (right angle) and keep your upper arms at your sides. Step back or move the chair back until the band is tight and there is no slack. Slowly pull your elbows back behind you. Hold for __________ seconds. Slowly return to the starting position. Repeat __________ times. Complete this exercise __________ times a day. Shoulder press-ups  Sit in a stable chair that has armrests. Sit upright, with your feet flat on the floor. Put your hands on the armrests so your elbows are bent and your fingers are pointing forward. Your hands should be about even with the sides of your body. Push down on the armrests and use your arms to lift yourself off the chair. Straighten your elbows and lift yourself up as much as you comfortably can. Move your shoulder blades down, and avoid letting your shoulders move up toward your ears. Keep your feet on the ground. As you get stronger, your feet should support less of your body weight as you lift yourself up. Hold for __________ seconds. Slowly lower yourself back into the chair. Repeat __________ times. Complete this exercise __________ times a day. Wall push-ups  Stand so you are facing a stable wall. Your feet should be about one arm-length away from the wall. Lean forward and place your palms on the wall at shoulder height. Keep your feet flat on the floor as you bend your elbows and lean forward toward the wall. Hold for __________ seconds. Straighten your elbows to push yourself back to the starting position. Repeat __________ times. Complete this exercise __________ times a day. This information is not intended to replace advice given to you by your health care provider. Make sure you discuss any questions you have with your health care provider. Document Revised: 11/17/2021 Document Reviewed: 11/17/2021 Elsevier Patient Education  2024 [employer] Exercises for Chronic  Knee Pain Chronic knee pain is pain that lasts longer than 3 months. For most people with chronic knee pain, exercise and weight loss is an important part of treatment. Your health care provider may want you to focus on: Making the muscles that support your knee stronger. This can take pressure off your knee and reduce pain. Preventing knee stiffness. How far you can move your knee, keeping it there or making it farther. Losing weight (if this applies) to take pressure off your knee, lower your risk for injury, and make it easier for you to exercise. Your provider will help you make an exercise program that fits your needs and physical abilities. Below are simple, low-impact exercises you can do at home. Ask your provider or physical therapist how  often you should do your exercise program and how many times to repeat each exercise. General safety tips  Get your provider's approval before doing any exercises. Start slowly and stop any time you feel pain. Do not exercise if your knee pain is flaring up. Warm up first. Stretching a cold muscle can cause an injury. Do 5-10 minutes of easy movement or light stretching before beginning your exercises. Do 5-10 minutes of low-impact activity (like walking or cycling) before starting strengthening exercises. Contact your provider any time you have pain during or after exercising. Exercise can cause discomfort but should not be painful. It is normal to be a little stiff or sore after exercising. Stretching and range-of-motion exercises Front thigh stretch  Stand up straight and support your body by holding on to a chair or resting one hand on a wall. With your legs straight and close together, bend one knee to lift your heel up toward your butt. Using one hand for support, grab your ankle with your free hand. Pull your foot up closer toward your butt to feel the stretch in front of your thigh. Hold the stretch for 30 seconds. Repeat __________ times.  Complete this exercise __________ times a day. Back thigh stretch  Sit on the floor with your back straight and your legs out straight in front of you. Place the palms of your hands on the floor and slide them toward your feet as you bend at the hip. Try to touch your nose to your knees and feel the stretch in the back of your thighs. Hold for 30 seconds. Repeat __________ times. Complete this exercise __________ times a day. Calf stretch  Stand facing a wall. Place the palms of your hands flat against the wall, arms extended, and lean slightly against the wall. Get into a lunge position with one leg bent at the knee and the other leg stretched out straight behind you. Keep both feet facing the wall and increase the bend in your knee while keeping the heel of the other leg flat on the ground. You should feel the stretch in your calf. Hold for 30 seconds. Repeat __________ times. Complete this exercise __________ times a day. Strengthening exercises Straight leg lift  Lie on your back with one knee bent and the other leg out straight. Slowly lift the straight leg without bending the knee. Lift until your foot is about 12 inches (30 cm) off the floor. Hold for 3-5 seconds and slowly lower your leg. Repeat __________ times. Complete this exercise __________ times a day. Single leg dip  Stand between two chairs and put both hands on the backs of the chairs for support. Extend one leg out straight with your body weight resting on the heel of the standing leg. Slowly bend your standing knee to dip your body to the level that is comfortable for you. Hold for 3-5 seconds. Repeat __________ times. Complete this exercise __________ times a day. Hamstring curls  Stand straight, knees close together, facing the back of a chair. Hold on to the back of a chair with both hands. Keep one leg straight. Bend the other knee while bringing the heel up toward the butt until the knee is bent at a  90-degree angle (right angle). Hold for 3-5 seconds. Repeat __________ times. Complete this exercise __________ times a day. Wall squat  Stand straight with your back, hips, and head against a wall. Step forward one foot at a time with your back still against the wall.  Your feet should be 2 feet (61 cm) from the wall at shoulder width. Keeping your back, hips, and head against the wall, slide down the wall to as close to a sitting position as you can get. Hold for 5-10 seconds, then slowly slide back up. Repeat __________ times. Complete this exercise __________ times a day. Step-ups  Stand in front of a sturdy platform or stool that is about 6 inches (15 cm) high. Slowly step up with your left / right foot, keeping your knee in line with your hip and foot. Do not let your knee bend so far that you cannot see your toes. Hold on to a chair for balance, but do not use it for support. Slowly unlock your knee and lower yourself to the starting position. Repeat __________ times. Complete this exercise __________ times a day. Contact a health care provider if: Your exercises cause pain. Your pain is worse after you exercise. Your pain prevents you from doing your exercises. This information is not intended to replace advice given to you by your health care provider. Make sure you discuss any questions you have with your health care provider. Document Revised: 10/12/2022 Document Reviewed: 10/12/2022 Elsevier Patient Education  2024 [employer].

## 2023-11-01 ENCOUNTER — Encounter: Payer: Self-pay | Admitting: *Deleted

## 2023-11-15 NOTE — Progress Notes (Signed)
 [facility]: Clinic Note  Subjective:  History of Present Illness: Christine [name] is a 88 y.o. year old female who presents for 28-month follow up. She is here with her daughter today. Please refer to Assessment and Plan below for full details in Problem-Based Charting.   Past Medical History:  Patient Active Problem List   Diagnosis Date Noted   Rotator cuff impingement syndrome of right shoulder 11/16/2023   Neck pain on left side 02/02/2023   Urinary incontinence 11/07/2017   Pseudogout 03/31/2017   Health care maintenance 10/09/2015   Type 2 diabetes mellitus (HCC) 09/23/2014   GERD 04/24/2009   Osteoarthritis 05/29/2007   Hyperlipidemia 08/10/2006   Essential hypertension 08/10/2006   Obesity, morbid (HCC) 08/10/2006     Medications:  Current Outpatient Medications:    acetaminophen  (TYLENOL  8 HOUR ARTHRITIS PAIN) 650 MG CR tablet, Take 1 tablet (650 mg total) by mouth every 8 (eight) hours as needed for pain., Disp: 90 tablet, Rfl: 3   atorvastatin  (LIPITOR) 40 MG tablet, Take 1 tablet (40 mg total) by mouth daily., Disp: 90 tablet, Rfl: 3   colchicine  0.6 MG tablet, TAKE 1 TABLET (0.6 MG TOTAL) BY MOUTH DAILY AS NEEDED (PSEUDOGOUT FLARE)., Disp: 90 tablet, Rfl: 0   diclofenac  Sodium (VOLTAREN ) 1 % GEL, Apply 2-4 grams to affected area up to 4 times daily as needed., Disp: 400 g, Rfl: 4   loratadine  (CLARITIN ) 10 MG tablet, Take 1 pill daily for allergies, Disp: 90 tablet, Rfl: 1   mirabegron  ER (MYRBETRIQ ) 50 MG TB24 tablet, Take 1 tablet (50 mg total) by mouth daily as needed (incontinence)., Disp: , Rfl:    omeprazole  (PRILOSEC) 40 MG capsule, Take 1 capsule (40 mg total) by mouth daily., Disp: 90 capsule, Rfl: 3   spironolactone  (ALDACTONE ) 50 MG tablet, Take 1 tablet (50 mg total) by mouth daily., Disp: 90 tablet, Rfl: 3   Allergies: Allergies  Allergen Reactions   Lisinopril  Swelling    Angioedema   Influenza Vaccines Hives    Fexofenadine-Pseudoephed Er Itching and Rash    Causes itching and rash       Objective:   Vitals: Vitals:   11/16/23 0906  BP: (!) 140/63  Pulse: 84  Temp: 97.7 F (36.5 C)  SpO2: 100%     Physical Exam: Physical Exam Constitutional:      Appearance: Normal appearance.  Cardiovascular:     Pulses: Normal pulses.     Heart sounds: Normal heart sounds.  Pulmonary:     Effort: Pulmonary effort is normal.     Breath sounds: Normal breath sounds.  Musculoskeletal:     Comments: R shoulder unable to abduct past 90 degress with active rom. +empty can test on R  Neurological:     Mental Status: She is alert.  Psychiatric:        Mood and Affect: Mood normal.        Behavior: Behavior normal.      Data: Labs, imaging, and micro were reviewed in Epic. Refer to Assessment and Plan below for full details in Problem-Based Charting.  Assessment & Plan:  Hyperlipidemia - Chronic and stable - Continue Atorvastatin  40mg  daily   Essential hypertension - chronic and stable - She has angioedema to ACE - At goal today on Spironolactone  50mg  daily.   GERD - Chronic and stable - Continue Omeprazole  40mg  daily for now - try to wean  Health care maintenance - She is up to date  Pseudogout -  chronic and stable - She sees Rheumatology - Continue colchicine  0.6mg  daily   Urinary incontinence - chronic and stable - continue Myrbetriq  and pelvic floor exercises  Osteoarthritis -This problem is chronic and stable -Patient has bilateral knee pain chronically -Patient did follow-up with orthopedic surgery and was advised to lose weight prior to her knee replacement -continue Voltaren  gel OTC  Obesity, morbid (HCC) - chronic and stable - I encouraged physical activity as tolerated - her O/A limits her activity   Rotator cuff impingement syndrome of right shoulder - I showed patient exercises to do at home to improve mobility in right shoulder - voltaren  gel & tylenol  for  pain   I completed a disability parking pass for patient today  Patient will follow up in 6 months   Mliss Foot, MD

## 2023-11-16 ENCOUNTER — Ambulatory Visit (INDEPENDENT_AMBULATORY_CARE_PROVIDER_SITE_OTHER): Payer: Medicare Other | Admitting: Internal Medicine

## 2023-11-16 ENCOUNTER — Encounter: Payer: Self-pay | Admitting: Internal Medicine

## 2023-11-16 VITALS — BP 140/63 | HR 84 | Temp 97.7°F | Ht 68.0 in | Wt 238.6 lb

## 2023-11-16 DIAGNOSIS — M7541 Impingement syndrome of right shoulder: Secondary | ICD-10-CM | POA: Insufficient documentation

## 2023-11-16 DIAGNOSIS — Z0000 Encounter for general adult medical examination without abnormal findings: Secondary | ICD-10-CM

## 2023-11-16 DIAGNOSIS — M150 Primary generalized (osteo)arthritis: Secondary | ICD-10-CM | POA: Diagnosis not present

## 2023-11-16 DIAGNOSIS — M1120 Other chondrocalcinosis, unspecified site: Secondary | ICD-10-CM

## 2023-11-16 DIAGNOSIS — Z09 Encounter for follow-up examination after completed treatment for conditions other than malignant neoplasm: Secondary | ICD-10-CM | POA: Diagnosis not present

## 2023-11-16 DIAGNOSIS — K219 Gastro-esophageal reflux disease without esophagitis: Secondary | ICD-10-CM | POA: Diagnosis not present

## 2023-11-16 DIAGNOSIS — E1136 Type 2 diabetes mellitus with diabetic cataract: Secondary | ICD-10-CM

## 2023-11-16 DIAGNOSIS — N3941 Urge incontinence: Secondary | ICD-10-CM

## 2023-11-16 DIAGNOSIS — I10 Essential (primary) hypertension: Secondary | ICD-10-CM

## 2023-11-16 DIAGNOSIS — E7849 Other hyperlipidemia: Secondary | ICD-10-CM

## 2023-11-16 DIAGNOSIS — E785 Hyperlipidemia, unspecified: Secondary | ICD-10-CM

## 2023-11-16 NOTE — Assessment & Plan Note (Signed)
-   Chronic and stable - Continue Omeprazole 40mg  daily for now - try to wean

## 2023-11-16 NOTE — Patient Instructions (Signed)
 Thank you, Christine [name] for allowing us  to provide your care today. Today we discussed your should pain, your knee pain, and your medicines. I did not make any major changes to your medicines today.    I have ordered the following labs for you:   Lab Orders         Microalbumin / Creatinine Urine Ratio      Tests ordered today:  None  Referrals ordered today:   Referral Orders  No referral(s) requested today     I have ordered the following medication/changed the following medications:   Stop the following medications: There are no discontinued medications.   Start the following medications: No orders of the defined types were placed in this encounter.    Return in about 6 months (around 05/15/2024) for Chronic medical conditions.    Remember:  - Please do your shoulder exercises - You can go to the [facility] to get voltaren  gel for $14  Should you have any questions or concerns please call the internal medicine clinic at [phone].     Arthur Speagle, MD Faculty, Internal Medicine Teaching Progam [facility]

## 2023-11-16 NOTE — Assessment & Plan Note (Signed)
-  This problem is chronic and stable -Patient has bilateral knee pain chronically -Patient did follow-up with orthopedic surgery and was advised to lose weight prior to her knee replacement -continue Voltaren  gel OTC

## 2023-11-16 NOTE — Assessment & Plan Note (Signed)
-   chronic and stable - I encouraged physical activity as tolerated - her O/A limits her activity

## 2023-11-16 NOTE — Assessment & Plan Note (Signed)
-   chronic and stable - continue Myrbetriq and pelvic floor exercises

## 2023-11-16 NOTE — Assessment & Plan Note (Signed)
 She is uptodate

## 2023-11-16 NOTE — Assessment & Plan Note (Signed)
-   chronic and stable - She sees Rheumatology - Continue colchicine  0.6mg  daily

## 2023-11-16 NOTE — Assessment & Plan Note (Signed)
-   I showed patient exercises to do at home to improve mobility in right shoulder - voltaren  gel & tylenol  for pain

## 2023-11-16 NOTE — Assessment & Plan Note (Signed)
-   Chronic and stable - Continue Atorvastatin 40mg  daily

## 2023-11-16 NOTE — Assessment & Plan Note (Signed)
-   chronic and stable - She has angioedema to ACE - At goal today on Spironolactone  50mg  daily.

## 2023-11-17 LAB — MICROALBUMIN / CREATININE URINE RATIO
Creatinine, Urine: 54.6 mg/dL
Microalb/Creat Ratio: <5 mg/g{creat} (ref 0–29)
Microalbumin, Urine: <3 ug/mL

## 2023-11-23 ENCOUNTER — Encounter: Payer: Medicare Other | Admitting: Internal Medicine

## 2024-01-04 ENCOUNTER — Telehealth: Payer: Self-pay | Admitting: *Deleted

## 2024-01-04 NOTE — Telephone Encounter (Signed)
 Copied from CRM [id number]. Topic: General - Other >> Jan 04, 2024 11:17 AM Christine [name] wrote: Reason for CRM: Patient called to speak to doctor, she stated the doctors called but called the wrong number, she needs to speak with her about filling out some paperwork, patients callback number [phone] patient states to leave a message if she does not answer because her phone works sometimes and sometimes it doesn't.[name]

## 2024-01-04 NOTE — Telephone Encounter (Signed)
 Will forward to Dr. Lafonda Mosses.

## 2024-01-19 ENCOUNTER — Other Ambulatory Visit: Payer: Self-pay | Admitting: Internal Medicine

## 2024-01-19 DIAGNOSIS — M160 Bilateral primary osteoarthritis of hip: Secondary | ICD-10-CM

## 2024-01-19 DIAGNOSIS — M1711 Unilateral primary osteoarthritis, right knee: Secondary | ICD-10-CM

## 2024-01-19 DIAGNOSIS — E7849 Other hyperlipidemia: Secondary | ICD-10-CM

## 2024-01-19 NOTE — Telephone Encounter (Signed)
 Called [facility], patient's atorvastatin has 2 refills left and has been ready for pick up for 4 days. diclofenac Sodium (VOLTAREN) 1 % GEL is prescribed by her Rheumatologist Dr Corliss Skains. Defer to prescribing MD. Cancelled reorder on both medications. Closing encounter.

## 2024-01-19 NOTE — Telephone Encounter (Signed)
 Copied from CRM [id number]. Topic: Clinical - Medication Refill >> Jan 19, 2024  9:46 AM Hamdi H wrote: Most Recent Primary Care Visit:  Provider: Mercie Eon  Department: [facility]  Visit Type: OPEN ESTABLISHED  Date: 11/16/2023  Medication: atorvastatin (LIPITOR) 40 MG tablet diclofenac Sodium (VOLTAREN) 1 % GEL   Has the patient contacted their pharmacy? Yes (Agent: If no, request that the patient contact the pharmacy for the refill. If patient does not wish to contact the pharmacy document the reason why and proceed with request.) (Agent: If yes, when and what did the pharmacy advise?) She was told to call providers office for refill.   Is this the correct pharmacy for this prescription? Yes If no, delete pharmacy and type the correct one.  This is the patient's preferred pharmacy:  [id number] - [place], [place] - [address] AT [facility] [address] [place] [address] Phone: [phone] Fax: [phone]   Has the prescription been filled recently? No  Is the patient out of the medication? Yes  Has the patient been seen for an appointment in the last year OR does the patient have an upcoming appointment? Yes  Can we respond through MyChart? No  Agent: Please be advised that Rx refills may take up to 3 business days. We ask that you follow-up with your pharmacy.

## 2024-01-30 ENCOUNTER — Ambulatory Visit: Payer: Self-pay

## 2024-01-30 ENCOUNTER — Other Ambulatory Visit: Payer: Self-pay | Admitting: Internal Medicine

## 2024-01-30 DIAGNOSIS — J309 Allergic rhinitis, unspecified: Secondary | ICD-10-CM

## 2024-01-30 DIAGNOSIS — I10 Essential (primary) hypertension: Secondary | ICD-10-CM

## 2024-01-30 NOTE — Telephone Encounter (Signed)
 One touch Verio is not on current med list.

## 2024-01-30 NOTE — Telephone Encounter (Signed)
 Copied from CRM [id number]. Topic: Clinical - Medication Refill >> Jan 30, 2024  9:50 AM Corin V wrote: Most Recent Primary Care Visit:  Provider: MACHEN, JULIE  Department: [facility]  Visit Type: OPEN ESTABLISHED  Date: 11/16/2023  Medication: spironolactone  (ALDACTONE ) 50 MG tablet loratadine  (CLARITIN ) 10 MG tablet  Has the patient contacted their pharmacy? Yes (Agent: If no, request that the patient contact the pharmacy for the refill. If patient does not wish to contact the pharmacy document the reason why and proceed with request.) (Agent: If yes, when and what did the pharmacy advise?)  Is this the correct pharmacy for this prescription? Yes If no, delete pharmacy and type the correct one.  This is the patient's preferred pharmacy:  [id number] - [place],  - [address] AT [facility] [address] [place] [address] Phone: [phone] Fax: [phone]   Has the prescription been filled recently? No  Is the patient out of the medication? No  Has the patient been seen for an appointment in the last year OR does the patient have an upcoming appointment? Yes  Can we respond through MyChart? No  Agent: Please be advised that Rx refills may take up to 3 business days. We ask that you follow-up with your pharmacy.

## 2024-01-30 NOTE — Telephone Encounter (Signed)
 Copied from CRM [id number]. Topic: Clinical - Medication Refill >> Jan 30, 2024  9:52 AM Corin V wrote: Most Recent Primary Care Visit:  Provider: MACHEN, JULIE  Department: [facility]  Visit Type: OPEN ESTABLISHED  Date: 11/16/2023  Medication: glucose blood (ONETOUCH VERIO) test strip [[id number]]  Has the patient contacted their pharmacy? Yes (Agent: If no, request that the patient contact the pharmacy for the refill. If patient does not wish to contact the pharmacy document the reason why and proceed with request.) (Agent: If yes, when and what did the pharmacy advise?)  Is this the correct pharmacy for this prescription? Yes If no, delete pharmacy and type the correct one.  This is the patient's preferred pharmacy:  [id number] - [place], [place] - [address] AT [facility] [address] [place] [address] Phone: [phone] Fax: [phone]   Has the prescription been filled recently? No  Is the patient out of the medication? No  Has the patient been seen for an appointment in the last year OR does the patient have an upcoming appointment? Yes  Can we respond through MyChart? No  Agent: Please be advised that Rx refills may take up to 3 business days. We ask that you follow-up with your pharmacy.

## 2024-01-31 MED ORDER — SPIRONOLACTONE 50 MG PO TABS: 50.0000 mg | ORAL_TABLET | Freq: Every day | ORAL | 2 refills | Status: DC

## 2024-01-31 MED ORDER — GLUCOSE BLOOD VI STRP: ORAL_STRIP | 12 refills | Status: DC

## 2024-01-31 MED ORDER — LORATADINE 10 MG PO TABS
ORAL_TABLET | ORAL | 1 refills | Status: DC
Start: 2024-01-31 — End: 2024-08-10

## 2024-02-14 ENCOUNTER — Telehealth: Payer: Self-pay | Admitting: Internal Medicine

## 2024-02-14 NOTE — Telephone Encounter (Signed)
 Called patient to review her exact needs for the [facility] [place] form. No answer, I left VM instructing her to call back.   When she calls back, can we please clarify: - what type of mobility aids she is currently using (ex- walker, wheelchair) - can she walk 3 blocks with her mobility aid? - can she wait outside for 15+ minutes or no?   Thanks so much

## 2024-02-15 NOTE — Telephone Encounter (Signed)
Called pt - no answer; left message on vm to call the office. 

## 2024-02-22 ENCOUNTER — Telehealth: Payer: Self-pay

## 2024-02-22 NOTE — Telephone Encounter (Signed)
 Returning pt phone called, pt did not answer her phone. I left message for pt to call back about Forms. Forms has not been received by [facility] office.

## 2024-02-22 NOTE — Telephone Encounter (Signed)
 Copied from CRM [id number]. Topic: Clinical - Order For Equipment >> Feb 22, 2024 11:15 AM Tiffany H wrote: Reason for CRM: Patient called back to respond to [employer] form. She provided the following answers when prompted:   When she calls back, can we please clarify: - what type of mobility aids she is currently using (ex- walker, wheelchair) - Cane, walker. Patient advised that she has several mobility aids.  - can she walk 3 blocks with her mobility aid? No.  - can she wait outside for 15+ minutes or no? Yes, but she would have to sit down due to her knees.   Please follow up with patient to set expectations of the next step.

## 2024-03-14 ENCOUNTER — Ambulatory Visit (INDEPENDENT_AMBULATORY_CARE_PROVIDER_SITE_OTHER)

## 2024-03-14 VITALS — Ht 68.0 in | Wt 238.0 lb

## 2024-03-14 DIAGNOSIS — Z0000 Encounter for general adult medical examination without abnormal findings: Secondary | ICD-10-CM

## 2024-03-14 DIAGNOSIS — Z599 Problem related to housing and economic circumstances, unspecified: Secondary | ICD-10-CM

## 2024-03-14 DIAGNOSIS — Z5941 Food insecurity: Secondary | ICD-10-CM
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
## 2024-03-14 NOTE — Patient Instructions (Signed)
 [name] , Thank you for taking time out of your busy schedule to complete your Annual Wellness Visit with me. I enjoyed our conversation and look forward to speaking with you again next year. I, as well as your care team,  appreciate your ongoing commitment to your health goals. Please review the following plan we discussed and let me know if I can assist you in the future. Your Game plan/ To Do List    Referrals: If you haven't heard from the office you've been referred to, please reach out to them at the phone provided.   Follow up Visits: Next Medicare AWV with our clinical staff: 03/20/2025 at 9:10 am Phone Visit with Health Advisor   Have you seen your provider in the last 6 months (3 months if uncontrolled diabetes)? Yes Next Office Visit with your provider: 03/28/2024 at 9:45 am Office Visit with Dr. Jarvis Mesa  Clinician Recommendations:  Aim for 30 minutes of exercise or brisk walking, 6-8 glasses of water, and 5 servings of fruits and vegetables each day.       This is a list of the screening recommended for you and due dates:  Health Maintenance  Topic Date Due   Hemoglobin A1C  11/05/2023   Eye exam for diabetics  12/20/2023   COVID-19 Vaccine (6 - 2024-25 season) 01/17/2024   Complete foot exam   02/02/2024   Medicare Annual Wellness Visit  03/14/2025   DTaP/Tdap/Td vaccine (2 - Td or Tdap) 05/11/2026   Pneumonia Vaccine  Completed   DEXA scan (bone density measurement)  Completed   Zoster (Shingles) Vaccine  Completed   HPV Vaccine  Aged Out   Meningitis B Vaccine  Aged Out    Advanced directives: (Declined) Advance directive discussed with you today. Even though you declined this today, please call our office should you change your mind, and we can give you the proper paperwork for you to fill out. Advance Care Planning is important because it:  [x]  Makes sure you receive the medical care that is consistent with your values, goals, and preferences  [x]  It provides  guidance to your family and loved ones and reduces their decisional burden about whether or not they are making the right decisions based on your wishes.  Follow the link provided in your after visit summary or read over the paperwork we have mailed to you to help you started getting your Advance Directives in place. If you need assistance in completing these, please reach out to us  so that we can help you!  See attachments for Preventive Care and Fall Prevention Tips.
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
## 2024-03-15 ENCOUNTER — Telehealth: Payer: Self-pay

## 2024-03-15 NOTE — Progress Notes (Signed)
   Telephone encounter was:  Unsuccessful.  03/15/2024 Name: KASLYN RICHBURG MRN: [id number] DOB: Apr 22, 1936  Unsuccessful outbound call made today to assist with:  Financial Difficulties related to financial strain  Outreach Attempt:  1st Attempt  A HIPAA compliant voice message was left requesting a return call.  Instructed patient to call back    Azell Leopard [facility] Guide, Phone: [phone] Fax: [phone] Website: [email or web address]

## 2024-03-19 ENCOUNTER — Telehealth: Payer: Self-pay

## 2024-03-19 NOTE — Progress Notes (Signed)
   Telephone encounter was:  Unsuccessful.  03/19/2024 Name: Christine [name] MRN: [id number] DOB: 1936-05-12  Unsuccessful outbound call made today to assist with:  Food Insecurity and Financial Difficulties related to financial strain  Outreach Attempt:  2nd Attempt  A HIPAA compliant voice message was left requesting a return call.  Instructed patient to call back    Azell Leopard [facility] Guide, Phone: [phone] Fax: [phone] Website: [email or web address]

## 2024-03-20 ENCOUNTER — Telehealth: Payer: Self-pay

## 2024-03-20 NOTE — Progress Notes (Signed)
   Telephone encounter was:  Unsuccessful.  03/20/2024 Name: Christine [name] MRN: [id number] DOB: Sep 14, 1936  Unsuccessful outbound call made today to assist with:  Food Insecurity and Financial Difficulties related to financial strain  Outreach Attempt:  3rd Attempt.  Referral closed unable to contact patient.  A HIPAA compliant voice message was left requesting a return call.  Instructed patient to call back    Azell Leopard [facility] Guide, Phone: [phone] Fax: [phone] Website: [email or web address]

## 2024-03-27 NOTE — Progress Notes (Signed)
 [facility]: Clinic Note  Subjective:  History of Present Illness: Christine [name] is a 88 y.o. year old female who presents for medical form completion & routine follow up. She is here with her daughter today.  I had received a transportation form to complete for her, but she and her daughter politely declined this, as the daughter is able to bring her to appointments.   She is doing well overall. Continues to have some right shoulder pain, but recent injection was helpful.    Please refer to Assessment and Plan below for full details in Problem-Based Charting.   Past Medical History:  Patient Active Problem List   Diagnosis Date Noted   Rotator cuff impingement syndrome of right shoulder 11/16/2023   Neck pain on left side 02/02/2023   Urinary incontinence 11/07/2017   Pseudogout 03/31/2017   Health care maintenance 10/09/2015   Type 2 diabetes mellitus (HCC) 09/23/2014   GERD 04/24/2009   Osteoarthritis 05/29/2007   Hyperlipidemia 08/10/2006   Essential hypertension 08/10/2006   Obesity, morbid (HCC) 08/10/2006      Medications:  Current Outpatient Medications:    amLODipine  (NORVASC ) 5 MG tablet, Take 1 tablet (5 mg total) by mouth daily., Disp: 90 tablet, Rfl: 3   acetaminophen  (TYLENOL  8 HOUR ARTHRITIS PAIN) 650 MG CR tablet, Take 1 tablet (650 mg total) by mouth every 8 (eight) hours as needed for pain., Disp: 90 tablet, Rfl: 3   atorvastatin  (LIPITOR) 40 MG tablet, Take 1 tablet (40 mg total) by mouth daily., Disp: 90 tablet, Rfl: 3   colchicine  0.6 MG tablet, Take 1 tablet (0.6 mg total) by mouth daily as needed (pseudogout flare)., Disp: 90 tablet, Rfl: 0   diclofenac  Sodium (VOLTAREN ) 1 % GEL, Apply 2-4 grams to affected area up to 4 times daily as needed., Disp: 400 g, Rfl: 4   loratadine  (CLARITIN ) 10 MG tablet, Take 1 pill daily for allergies, Disp: 90 tablet, Rfl: 1   omeprazole  (PRILOSEC) 40 MG capsule, Take 1 capsule (40 mg total) by  mouth daily., Disp: 90 capsule, Rfl: 3   Allergies: Allergies  Allergen Reactions   Lisinopril  Swelling    Angioedema   Influenza Vaccines Hives   Fexofenadine-Pseudoephed Er Itching and Rash    Causes itching and rash       Objective:   Vitals: Vitals:   03/28/24 0945 03/28/24 1029  BP: (!) 161/71 135/89  Pulse: 80 72  Temp: 97.9 F (36.6 C)   SpO2: 100%      Physical Exam: Physical Exam Constitutional:      Appearance: Normal appearance.  Pulmonary:     Effort: Pulmonary effort is normal.     Breath sounds: Normal breath sounds.   Neurological:     Mental Status: She is alert.   Psychiatric:        Mood and Affect: Mood normal.        Behavior: Behavior normal.      Data: Labs, imaging, and micro were reviewed in Epic. Refer to Assessment and Plan below for full details in Problem-Based Charting.  Assessment & Plan:  Hyperlipidemia - Chronic and stable - Continue Atorvastatin  40mg  daily  - could consider stopping in the future if polypharmacy becomes a concern  Essential hypertension - chronic and above goal - stop spironolactone , which is contributing to urinary incontinence - start amlodipine  5mg  daily - I encouraged home blood pressure readings   GERD - Chronic and stable - Continue Omeprazole  40mg  daily  for now - not able to wean  Type 2 diabetes mellitus (HCC) - chronic and stable, diet controlled - foot exam done today - has follow up with Dr. Candi Chafe with Ophtho  Pseudogout - chronic and stable - She sees Rheumatology - Continue colchicine  0.6mg  daily as needed for flare  Urinary incontinence - chronic and stable - she is not taking Mybetriq, so I removed this from her med list        Patient will follow up in 6 months  Driscilla George, MD

## 2024-03-28 ENCOUNTER — Ambulatory Visit (INDEPENDENT_AMBULATORY_CARE_PROVIDER_SITE_OTHER): Admitting: Internal Medicine

## 2024-03-28 ENCOUNTER — Encounter: Payer: Self-pay | Admitting: Physician Assistant

## 2024-03-28 ENCOUNTER — Encounter: Payer: Self-pay | Admitting: Internal Medicine

## 2024-03-28 ENCOUNTER — Other Ambulatory Visit: Payer: Self-pay

## 2024-03-28 ENCOUNTER — Ambulatory Visit: Payer: Medicare Other | Attending: Physician Assistant | Admitting: Physician Assistant

## 2024-03-28 VITALS — BP 153/91 | HR 88 | Resp 16 | Ht 68.0 in | Wt 227.0 lb

## 2024-03-28 VITALS — BP 135/89 | HR 72 | Temp 97.9°F | Ht 68.0 in | Wt 227.0 lb

## 2024-03-28 DIAGNOSIS — Z8719 Personal history of other diseases of the digestive system: Secondary | ICD-10-CM | POA: Insufficient documentation

## 2024-03-28 DIAGNOSIS — N3941 Urge incontinence: Secondary | ICD-10-CM

## 2024-03-28 DIAGNOSIS — M1711 Unilateral primary osteoarthritis, right knee: Secondary | ICD-10-CM | POA: Diagnosis not present

## 2024-03-28 DIAGNOSIS — R7303 Prediabetes: Secondary | ICD-10-CM | POA: Insufficient documentation

## 2024-03-28 DIAGNOSIS — M109 Gout, unspecified: Secondary | ICD-10-CM

## 2024-03-28 DIAGNOSIS — Z8639 Personal history of other endocrine, nutritional and metabolic disease: Secondary | ICD-10-CM | POA: Diagnosis present

## 2024-03-28 DIAGNOSIS — Z8679 Personal history of other diseases of the circulatory system: Secondary | ICD-10-CM | POA: Insufficient documentation

## 2024-03-28 DIAGNOSIS — E785 Hyperlipidemia, unspecified: Secondary | ICD-10-CM | POA: Diagnosis not present

## 2024-03-28 DIAGNOSIS — M25511 Pain in right shoulder: Secondary | ICD-10-CM | POA: Diagnosis not present

## 2024-03-28 DIAGNOSIS — M160 Bilateral primary osteoarthritis of hip: Secondary | ICD-10-CM | POA: Insufficient documentation

## 2024-03-28 DIAGNOSIS — L409 Psoriasis, unspecified: Secondary | ICD-10-CM | POA: Insufficient documentation

## 2024-03-28 DIAGNOSIS — E7849 Other hyperlipidemia: Secondary | ICD-10-CM

## 2024-03-28 DIAGNOSIS — M1120 Other chondrocalcinosis, unspecified site: Secondary | ICD-10-CM | POA: Diagnosis not present

## 2024-03-28 DIAGNOSIS — M19041 Primary osteoarthritis, right hand: Secondary | ICD-10-CM | POA: Insufficient documentation

## 2024-03-28 DIAGNOSIS — E1136 Type 2 diabetes mellitus with diabetic cataract: Secondary | ICD-10-CM

## 2024-03-28 DIAGNOSIS — M7061 Trochanteric bursitis, right hip: Secondary | ICD-10-CM | POA: Diagnosis not present

## 2024-03-28 DIAGNOSIS — G8929 Other chronic pain: Secondary | ICD-10-CM | POA: Diagnosis present

## 2024-03-28 DIAGNOSIS — I10 Essential (primary) hypertension: Secondary | ICD-10-CM

## 2024-03-28 DIAGNOSIS — M19042 Primary osteoarthritis, left hand: Secondary | ICD-10-CM | POA: Diagnosis present

## 2024-03-28 DIAGNOSIS — K219 Gastro-esophageal reflux disease without esophagitis: Secondary | ICD-10-CM

## 2024-03-28 DIAGNOSIS — E119 Type 2 diabetes mellitus without complications: Secondary | ICD-10-CM

## 2024-03-28 LAB — POCT GLYCOSYLATED HEMOGLOBIN (HGB A1C): Hemoglobin A1C: 5.8 % — AB (ref 4.0–5.6)

## 2024-03-28 LAB — GLUCOSE, CAPILLARY: Glucose-Capillary: 91 mg/dL (ref 70–99)

## 2024-03-28 MED ORDER — TRIAMCINOLONE ACETONIDE 40 MG/ML IJ SUSP
40.0000 mg | INTRAMUSCULAR | Status: AC | PRN
  Administered 2024-03-28: 40 mg via INTRA_ARTICULAR

## 2024-03-28 MED ORDER — COLCHICINE 0.6 MG PO TABS: 0.6000 mg | ORAL_TABLET | Freq: Every day | ORAL | 0 refills | Status: DC | PRN

## 2024-03-28 MED ORDER — LIDOCAINE HCL 1 % IJ SOLN
1.0000 mL | INTRAMUSCULAR | Status: AC | PRN
  Administered 2024-03-28: 1 mL

## 2024-03-28 MED ORDER — AMLODIPINE BESYLATE 5 MG PO TABS
5.0000 mg | ORAL_TABLET | Freq: Every day | ORAL | 3 refills | Status: DC
  Filled 2024-03-28: qty 90, 90d supply, fill #0

## 2024-03-28 MED ORDER — OMEPRAZOLE 40 MG PO CPDR
40.0000 mg | DELAYED_RELEASE_CAPSULE | Freq: Every day | ORAL | 3 refills | Status: DC
  Filled 2024-03-28: qty 90, 90d supply, fill #0

## 2024-03-28 NOTE — Assessment & Plan Note (Signed)
-   chronic and stable - she is not taking Mybetriq, so I removed this from her med list

## 2024-03-28 NOTE — Assessment & Plan Note (Signed)
-   chronic and stable - She sees Rheumatology - Continue colchicine  0.6mg  daily as needed for flare

## 2024-03-28 NOTE — Assessment & Plan Note (Signed)
-   Chronic and stable - Continue Atorvastatin  40mg  daily  - could consider stopping in the future if polypharmacy becomes a concern

## 2024-03-28 NOTE — Assessment & Plan Note (Signed)
-   chronic and above goal - stop spironolactone , which is contributing to urinary incontinence - start amlodipine  5mg  daily - I encouraged home blood pressure readings

## 2024-03-28 NOTE — Patient Instructions (Signed)
 Thank you, Christine [name] for allowing us  to provide your care today. Today we discussed your blood pressure and your medicines.    I have ordered the following labs for you:  Lab Orders         Glucose, capillary         POC Hbg A1C        Referrals ordered today:   Referral Orders  No referral(s) requested today     I have ordered the following medication/changed the following medications:   Stop the following medications: Medications Discontinued During This Encounter  Medication Reason   mirabegron  ER (MYRBETRIQ ) 50 MG TB24 tablet    omeprazole  (PRILOSEC) 40 MG capsule Reorder   glucose blood test strip    spironolactone  (ALDACTONE ) 50 MG tablet      Start the following medications: Meds ordered this encounter  Medications   omeprazole  (PRILOSEC) 40 MG capsule    Sig: Take 1 capsule (40 mg total) by mouth daily.    Dispense:  90 capsule    Refill:  3   amLODipine  (NORVASC ) 5 MG tablet    Sig: Take 1 tablet (5 mg total) by mouth daily.    Dispense:  90 tablet    Refill:  3     Return in about 3 months (around 06/28/2024) for Blood pressure.    Remember:  - STOP taking the spironolactone . This medicine was making you urinate too much. - START taking Amlodipine  5mg  daily. - If you can afford it, please pick up a blood pressure cuff for home. Check your blood pressure at home a few times a week, and bring these readings to your appointments - Please bring your medicines to your next appointment   Should you have any questions or concerns please call the internal medicine clinic at [phone].     Mayrin Schmuck, MD Faculty, Internal Medicine Teaching Progam [facility]

## 2024-03-28 NOTE — Assessment & Plan Note (Signed)
-   Chronic and stable - Continue Omeprazole  40mg  daily for now - not able to wean

## 2024-03-28 NOTE — Assessment & Plan Note (Addendum)
-   chronic and stable, diet controlled - foot exam done today - has follow up with Dr. Candi Chafe with Ophtho

## 2024-04-23 ENCOUNTER — Encounter: Payer: Self-pay | Admitting: *Deleted

## 2024-05-16 ENCOUNTER — Ambulatory Visit: Payer: Self-pay

## 2024-05-16 VITALS — BP 124/67 | HR 89 | Temp 97.9°F | Ht 68.0 in | Wt 229.4 lb

## 2024-05-16 DIAGNOSIS — M160 Bilateral primary osteoarthritis of hip: Secondary | ICD-10-CM | POA: Diagnosis not present

## 2024-05-16 DIAGNOSIS — I10 Essential (primary) hypertension: Secondary | ICD-10-CM | POA: Diagnosis not present

## 2024-05-16 DIAGNOSIS — K5900 Constipation, unspecified: Secondary | ICD-10-CM

## 2024-05-16 DIAGNOSIS — K219 Gastro-esophageal reflux disease without esophagitis: Secondary | ICD-10-CM | POA: Diagnosis not present

## 2024-05-16 DIAGNOSIS — M1711 Unilateral primary osteoarthritis, right knee: Secondary | ICD-10-CM

## 2024-05-16 DIAGNOSIS — M7541 Impingement syndrome of right shoulder: Secondary | ICD-10-CM | POA: Diagnosis not present

## 2024-05-16 MED ORDER — OMEPRAZOLE 40 MG PO CPDR: 40.0000 mg | DELAYED_RELEASE_CAPSULE | Freq: Every day | ORAL | 3 refills | Status: AC

## 2024-05-16 MED ORDER — POLYETHYLENE GLYCOL 3350 17 G PO PACK: 17.0000 g | PACK | Freq: Every day | ORAL | 0 refills | Status: AC

## 2024-05-16 MED ORDER — AMLODIPINE BESYLATE 5 MG PO TABS: 5.0000 mg | ORAL_TABLET | Freq: Every day | ORAL | 3 refills | Status: AC

## 2024-05-16 MED ORDER — DICLOFENAC SODIUM 1 % EX GEL
CUTANEOUS | 4 refills | Status: DC
Start: 2024-05-16 — End: 2024-06-06

## 2024-05-16 NOTE — Assessment & Plan Note (Signed)
-   continue tylenol PRN

## 2024-05-16 NOTE — Progress Notes (Signed)
   Established Patient Office Visit  Subjective   Patient ID: Christine [name], female    DOB: [birth year]  Age: 88 y.o. MRN: [id number]   HPI  76F PMHx T2DM, HTN, GERD, OA who presents for nausea/bloating and continued shoulder pain.   Nausea/bloating - pt reports several week hx of nausea, bloating worsened after meals. She denies any epigastric pain, reflux symptoms, diarrhea, emesis. Does report that she has been more constipated than usual, having BM's q2-3 days for which she has been taking raisins to help regulate her Bms. Raisins help with her constipation but worsen her stomach discomfort. Also notes increased flatulence.   Shoulder pain - continues to have R>L shoulder pain, worsened recently in the setting of running out of her Voltaren  gel which she found helpful. Pain continues to be worse with use. L shoulder and knees are also bothersome but not as severe as R shoulder  Review of Systems  Constitutional: Negative.   HENT: Negative.    Respiratory: Negative.    Cardiovascular: Negative.   Gastrointestinal:  Positive for constipation and nausea. Negative for diarrhea, heartburn and vomiting.  Musculoskeletal:  Positive for joint pain.  Skin: Negative.   Neurological:  Negative for sensory change, focal weakness and weakness.      Objective:    Vitals:   05/16/24 0849  BP: 124/67  Pulse: 89  Temp: 97.9 F (36.6 C)  SpO2: 100%   Physical Exam GEN: Well appearing, NAD. Oriented x 3, normal mood and affect  HEENT: Normocephalic, atraumatic CV: RRR, no M/R/G GI: no TTP in all 4 quadrants MSK: +pain with R shoulder abduction, flexion, ROM limited to ~90 deg d/t pain NEURO:  moving all extremities spontaneously in upper and lower extremities.  PSYCH: intact recent and remote memory, judgment and insight, normal mood and affect.   Assessment & Plan Constipation, unspecified constipation type Suspect constipation as main driver of nausea, bloating, increased  flatulence. - Will start with Miralax  17g daily, continue PRN Zofran , Prilosec before meals. Essential hypertension BP well controlled.  - Cont home amlodipine  5 mg  Primary osteoarthritis of right knee - continue tylenol  PRN Gastroesophageal reflux disease without esophagitis Continue Prilosec, encouraged to take before meals Rotator cuff impingement syndrome of right shoulder Worsened with running out of Voltaren  gel. Exam today w/ ROM limited by pain particularly with abduction and forward flexion.  - cont voltaren  gel, tylenol  PRN  Follow up in 6 months or sooner PRN.  Jone Dauphin, MD

## 2024-05-16 NOTE — Patient Instructions (Signed)
 Thank you for coming in today! If you have any questions or concerns, please feel free to contact the office.

## 2024-05-16 NOTE — Assessment & Plan Note (Signed)
 BP well controlled.  - Cont home amlodipine  5 mg

## 2024-05-16 NOTE — Assessment & Plan Note (Addendum)
 Suspect constipation as main driver of nausea, bloating, increased flatulence. - Will start with Miralax  17g daily, continue PRN Zofran , Prilosec before meals.

## 2024-05-16 NOTE — Assessment & Plan Note (Signed)
 Worsened with running out of Voltaren  gel. Exam today w/ ROM limited by pain particularly with abduction and forward flexion.  - cont voltaren  gel, tylenol  PRN

## 2024-05-16 NOTE — Assessment & Plan Note (Signed)
 Continue Prilosec, encouraged to take before meals

## 2024-05-28 ENCOUNTER — Emergency Department (HOSPITAL_COMMUNITY)
Admission: EM | Admit: 2024-05-28 | Discharge: 2024-05-29 | Disposition: A | Discharge status: Home / Self Care | Attending: Emergency Medicine | Admitting: Emergency Medicine

## 2024-05-28 ENCOUNTER — Other Ambulatory Visit: Payer: Self-pay

## 2024-05-28 ENCOUNTER — Encounter (HOSPITAL_COMMUNITY): Payer: Self-pay | Admitting: Emergency Medicine

## 2024-05-28 ENCOUNTER — Ambulatory Visit: Payer: Self-pay

## 2024-05-28 DIAGNOSIS — Z79899 Other long term (current) drug therapy: Secondary | ICD-10-CM | POA: Diagnosis not present

## 2024-05-28 DIAGNOSIS — I10 Essential (primary) hypertension: Secondary | ICD-10-CM | POA: Diagnosis not present

## 2024-05-28 DIAGNOSIS — R300 Dysuria: Secondary | ICD-10-CM | POA: Diagnosis present

## 2024-05-28 DIAGNOSIS — N3090 Cystitis, unspecified without hematuria: Secondary | ICD-10-CM | POA: Diagnosis not present

## 2024-05-28 DIAGNOSIS — E119 Type 2 diabetes mellitus without complications: Secondary | ICD-10-CM | POA: Diagnosis not present

## 2024-05-28 LAB — COMPREHENSIVE METABOLIC PANEL WITH GFR
ALT: 18 U/L (ref 0–44)
AST: 24 U/L (ref 15–41)
Albumin: 4.1 g/dL (ref 3.5–5.0)
Alkaline Phosphatase: 87 U/L (ref 38–126)
Anion gap: 14 (ref 5–15)
BUN: 25 mg/dL — ABNORMAL HIGH (ref 8–23)
CO2: 25 mmol/L (ref 22–32)
Calcium: 9.6 mg/dL (ref 8.9–10.3)
Chloride: 99 mmol/L (ref 98–111)
Creatinine, Ser: 0.98 mg/dL (ref 0.44–1.00)
GFR, Estimated: 56 mL/min — ABNORMAL LOW (ref >60)
Glucose, Bld: 129 mg/dL — ABNORMAL HIGH (ref 70–99)
Potassium: 3.8 mmol/L (ref 3.5–5.1)
Sodium: 138 mmol/L (ref 135–145)
Total Bilirubin: 0.8 mg/dL (ref 0.0–1.2)
Total Protein: 8.3 g/dL — ABNORMAL HIGH (ref 6.5–8.1)

## 2024-05-28 LAB — CBC
HCT: 39.1 % (ref 36.0–46.0)
Hemoglobin: 12.4 g/dL (ref 12.0–15.0)
MCH: 28.5 pg (ref 26.0–34.0)
MCHC: 31.7 g/dL (ref 30.0–36.0)
MCV: 89.9 fL (ref 80.0–100.0)
Platelets: 154 K/uL (ref 150–400)
RBC: 4.35 MIL/uL (ref 3.87–5.11)
RDW: 14.1 % (ref 11.5–15.5)
WBC: 7.7 K/uL (ref 4.0–10.5)
nRBC: 0 % (ref 0.0–0.2)

## 2024-05-28 NOTE — ED Triage Notes (Addendum)
 Patient c/o LLQ abdominal pain x 5 hours ago. Patient report loose stool 5-6x today and frequent urination today. Patient denies N/V.

## 2024-05-28 NOTE — Telephone Encounter (Signed)
 FYI Only or Action Required?: Action required by provider: medication refill request.- request for antibiotic to be called in  Patient was last seen in primary care on 05/16/2024 by Shawn Sick, MD.  Called Nurse Triage reporting urinary symptoms.  Symptoms began today.  Interventions attempted: Nothing.  Symptoms are: gradually worsening.  Triage Disposition: See Physician Within 24 Hours  Patient/caregiver understands and will follow disposition?: Yes   Copied from CRM #[id number]. Topic: Clinical - Red Word Triage >> May 28, 2024  5:35 PM Corin V wrote: [facility] that prompted transfer to Nurse Triage: Patient is having pain when using the bathroom. Possible urinary tract infection. Last hour it has been a lot of pressure on the bladder with very little output. She is having urgecny 3-4 times an hour Reason for Disposition  Urinating more frequently than usual (i.e., frequency) OR new-onset of the feeling of an urgent need to urinate (i.e., urgency)  Answer Assessment - Initial Assessment Questions 1. SYMPTOM: What's the main symptom you're concerned about? (e.g., frequency, incontinence)     Urinary frequency and pressure 2. ONSET: When did the  symptoms  start?     today 3. PAIN: Is there any pain? If Yes, ask: How bad is it? (Scale: 1-10; mild, moderate, severe)     Pain and pressure with urination 4. CAUSE: What do you think is causing the symptoms?     UTI 5. OTHER SYMPTOMS: Do you have any other symptoms? (e.g., blood in urine, fever, flank pain, pain with urination)     Denies blood.  Positive for left flank pain  Protocols used: Urinary Symptoms-A-AH

## 2024-05-29 ENCOUNTER — Emergency Department (HOSPITAL_COMMUNITY)

## 2024-05-29 ENCOUNTER — Ambulatory Visit: Payer: Self-pay

## 2024-05-29 DIAGNOSIS — R1032 Left lower quadrant pain: Secondary | ICD-10-CM | POA: Diagnosis not present

## 2024-05-29 DIAGNOSIS — N3090 Cystitis, unspecified without hematuria: Secondary | ICD-10-CM | POA: Diagnosis not present

## 2024-05-29 DIAGNOSIS — K5730 Diverticulosis of large intestine without perforation or abscess without bleeding: Secondary | ICD-10-CM | POA: Diagnosis not present

## 2024-05-29 LAB — URINALYSIS, ROUTINE W REFLEX MICROSCOPIC
Bilirubin Urine: NEGATIVE
Glucose, UA: NEGATIVE mg/dL
Nitrite: NEGATIVE
Protein, ur: NEGATIVE mg/dL
RBC / HPF: >50 RBC/hpf (ref 0–5)
Specific Gravity, Urine: 1.015 (ref 1.005–1.030)
WBC, UA: >50 WBC/hpf (ref 0–5)
pH: 5.5 (ref 5.0–8.0)

## 2024-05-29 LAB — MAGNESIUM: Magnesium: 2 mg/dL (ref 1.7–2.4)

## 2024-05-29 MED ORDER — CEPHALEXIN 500 MG PO CAPS: 500.0000 mg | ORAL_CAPSULE | Freq: Four times a day (QID) | ORAL | 0 refills | Status: AC

## 2024-05-29 MED ORDER — KETOROLAC TROMETHAMINE 15 MG/ML IJ SOLN
15.0000 mg | Freq: Once | INTRAMUSCULAR | Status: AC
  Administered 2024-05-29: 15 mg via INTRAVENOUS
  Filled 2024-05-29: qty 1

## 2024-05-29 MED ORDER — ONDANSETRON HCL 4 MG/2ML IJ SOLN
4.0000 mg | Freq: Once | INTRAMUSCULAR | Status: AC
  Administered 2024-05-29: 4 mg via INTRAVENOUS
  Filled 2024-05-29: qty 2

## 2024-05-29 MED ORDER — ONDANSETRON 4 MG PO TBDP: 4.0000 mg | ORAL_TABLET | Freq: Three times a day (TID) | ORAL | 0 refills | Status: AC | PRN

## 2024-05-29 MED ORDER — SODIUM CHLORIDE 0.9 % IV SOLN
2.0000 g | Freq: Once | INTRAVENOUS | Status: AC
  Administered 2024-05-29: 2 g via INTRAVENOUS
  Filled 2024-05-29: qty 20

## 2024-05-29 NOTE — Discharge Instructions (Signed)
 Your test results today are overall reassuring.  Your urine does show evidence of infection.  You were given your first dose of antibiotics in the emergency department.  A prescription for ongoing antibiotics was sent to your pharmacy.  Take as prescribed, starting tonight.  A prescription for a medication called ondansetron  was also sent to your pharmacy.  Take this as needed for nausea.  Drink plenty of fluids to stay hydrated.  Return to the ED for any new or worsening symptoms of concern.

## 2024-05-29 NOTE — ED Provider Notes (Signed)
 [place] EMERGENCY DEPARTMENT AT [facility] Provider Note   CSN: [id number] Arrival date & time: 05/28/24  2024     Patient presents with: Abdominal Pain   Christine [name] is a 88 y.o. female.    Abdominal Pain Associated symptoms: dysuria and nausea   Patient presents for abdominal pain.  Medical history includes HTN, HLD, DM, GERD, constipation, arthritis.  Earlier today, she had onset of lower abdominal pain.  This was noticed when she was urinating.  She has had ongoing discomfort with urination today.  She has had nausea without vomiting.  She denies any other associated symptoms.      Prior to Admission medications   Medication Sig Start Date End Date Taking? Authorizing Provider  cephALEXin  (KEFLEX ) 500 MG capsule Take 1 capsule (500 mg total) by mouth 4 (four) times daily. 05/29/24  Yes Melvenia Motto, MD  ondansetron  (ZOFRAN -ODT) 4 MG disintegrating tablet Take 1 tablet (4 mg total) by mouth every 8 (eight) hours as needed for nausea or vomiting. 05/29/24  Yes Melvenia Motto, MD  acetaminophen  (TYLENOL  8 HOUR ARTHRITIS PAIN) 650 MG CR tablet Take 1 tablet (650 mg total) by mouth every 8 (eight) hours as needed for pain. 11/03/22   Lovie Clarity, MD  amLODipine  (NORVASC ) 5 MG tablet Take 1 tablet (5 mg total) by mouth daily. 05/16/24 05/16/25  Shawn Sick, MD  atorvastatin  (LIPITOR) 40 MG tablet Take 1 tablet (40 mg total) by mouth daily. 07/21/23   Lovie Clarity, MD  colchicine  0.6 MG tablet Take 1 tablet (0.6 mg total) by mouth daily as needed (pseudogout flare). 03/28/24   Cheryl Waddell HERO, PA-C  diclofenac  Sodium (VOLTAREN ) 1 % GEL Apply 2-4 grams to affected area up to 4 times daily as needed. 05/16/24   Shawn Sick, MD  loratadine  (CLARITIN ) 10 MG tablet Take 1 pill daily for allergies 01/31/24   Lovie Clarity, MD  omeprazole  (PRILOSEC) 40 MG capsule Take 1 capsule (40 mg total) by mouth daily. 05/16/24   Shawn Sick, MD  polyethylene glycol (MIRALAX ) 17 g packet Take 17 g  by mouth daily. 05/16/24 06/15/24  Shawn Sick, MD    Allergies: Lisinopril , Influenza vaccines, and Fexofenadine-pseudoephed er    Review of Systems  Gastrointestinal:  Positive for abdominal pain and nausea.  Genitourinary:  Positive for dysuria and frequency.  All other systems reviewed and are negative.   Updated Vital Signs BP 128/70 (BP Location: Left Arm)   Pulse 80   Temp 97.6 F (36.4 C) (Oral)   Resp 18   Ht 5' 8 (1.727 m)   Wt 105 kg   LMP 01/12/1972   SpO2 100%   BMI 35.20 kg/m   Physical Exam Vitals and nursing note reviewed.  Constitutional:      General: She is not in acute distress.    Appearance: She is well-developed. She is not ill-appearing, toxic-appearing or diaphoretic.  HENT:     Head: Normocephalic and atraumatic.     Mouth/Throat:     Mouth: Mucous membranes are moist.  Eyes:     Conjunctiva/sclera: Conjunctivae normal.  Cardiovascular:     Rate and Rhythm: Normal rate and regular rhythm.  Pulmonary:     Effort: Pulmonary effort is normal. No respiratory distress.  Abdominal:     Palpations: Abdomen is soft.     Tenderness: There is abdominal tenderness in the suprapubic area. There is no guarding or rebound.  Musculoskeletal:        General: No swelling.  Cervical back: Neck supple.  Skin:    General: Skin is warm and dry.  Neurological:     General: No focal deficit present.     Mental Status: She is alert and oriented to person, place, and time.  Psychiatric:        Mood and Affect: Mood normal.        Behavior: Behavior normal.     (all labs ordered are listed, but only abnormal results are displayed) Labs Reviewed  COMPREHENSIVE METABOLIC PANEL WITH GFR - Abnormal; Notable for the following components:      Result Value   Glucose, Bld 129 (*)    BUN 25 (*)    Total Protein 8.3 (*)    GFR, Estimated 56 (*)    All other components within normal limits  URINALYSIS, ROUTINE W REFLEX MICROSCOPIC - Abnormal; Notable for the  following components:   APPearance HAZY (*)    Hgb urine dipstick LARGE (*)    Ketones, ur TRACE (*)    Leukocytes,Ua MODERATE (*)    Bacteria, UA RARE (*)    All other components within normal limits  CBC  MAGNESIUM    EKG: None  Radiology: CT Renal Stone Study Result Date: 05/29/2024 EXAM: CT ABDOMEN AND PELVIS WITHOUT CONTRAST 05/29/2024 06:20:28 AM TECHNIQUE: CT of the abdomen and pelvis was performed without the administration of intravenous contrast. Multiplanar reformatted images are provided for review. Automated exposure control, iterative reconstruction, and/or weight based adjustment of the mA/kV was utilized to reduce the radiation dose to as low as reasonably achievable. COMPARISON: CT of the abdomen and pelvis dated 08/20/2011. CLINICAL HISTORY: Abdominal/flank pain, stone suspected. LLQ abdominal pain x 5 hours ago. Patient report loose stool 5-6x today and frequent urination today. FINDINGS: LOWER CHEST: No acute abnormality. LIVER: The liver is unremarkable. GALLBLADDER AND BILE DUCTS: Gallbladder is unremarkable. No biliary ductal dilatation. SPLEEN: No acute abnormality. PANCREAS: No acute abnormality. ADRENAL GLANDS: No acute abnormality. KIDNEYS, URETERS AND BLADDER: No stones in the kidneys or ureters. No hydronephrosis. No perinephric or periureteral stranding. Urinary bladder is unremarkable. GI AND BOWEL: Stomach demonstrates no acute abnormality. There is no bowel obstruction. No bowel wall thickening. Numerous colonic diverticula are present. There is no evidence of diverticulitis. PERITONEUM AND RETROPERITONEUM: No ascites. No free air. VASCULATURE: The abdominal aorta demonstrates mild-to-moderate calcific atheromatous disease. LYMPH NODES: No lymphadenopathy. REPRODUCTIVE ORGANS: The patient is status post hysterectomy. BONES AND SOFT TISSUES: There is mild degenerative spondylolisthesis at L4-5 with marked bilateral facet arthrosis. There is also moderate chronic  degenerative disc disease and facet arthrosis at L5-S1. No acute osseous abnormality. No focal soft tissue abnormality. IMPRESSION: 1. No acute findings in the abdomen or pelvis. 2. Numerous colonic diverticula without evidence of diverticulitis. Electronically signed by: Evalene Coho MD 05/29/2024 06:29 AM EDT RP Workstation: [id number]     Procedures   Medications Ordered in the ED  ondansetron  (ZOFRAN ) injection 4 mg (4 mg Intravenous Given 05/29/24 0254)  ketorolac  (TORADOL ) 15 MG/ML injection 15 mg (15 mg Intravenous Given 05/29/24 0252)  cefTRIAXone  (ROCEPHIN ) 2 g in sodium chloride  0.9 % 100 mL IVPB (2 g Intravenous New Bag/Given 05/29/24 0348)                                    Medical Decision Making Amount and/or Complexity of Data Reviewed Labs: ordered. Radiology: ordered.  Risk Prescription drug management.   This patient  presents to the ED for concern of dysuria, lower abdominal pain, this involves an extensive number of treatment options, and is a complaint that carries with it a high risk of complications and morbidity.  The differential diagnosis includes UTI, constipation, colitis, interstitial cystitis, neoplasm, urine retention   Co morbidities / Chronic conditions that complicate the patient evaluation  HTN, HLD, DM, GERD, constipation, arthritis   Additional history obtained:  Additional history obtained from EMR External records from outside source obtained and reviewed including patient's daughter   Lab Tests:  I Ordered, and personally interpreted labs.  The pertinent results include: Normal hemoglobin, no leukocytosis, normal kidney function, normal electrolytes.  Her urinalysis shows evidence of infection.   Imaging Studies ordered:  I ordered imaging studies including CT of abdomen and pelvis I independently visualized and interpreted imaging which showed no acute findings I agree with the radiologist interpretation   Cardiac Monitoring:  / EKG:  The patient was maintained on a cardiac monitor.  I personally viewed and interpreted the cardiac monitored which showed an underlying rhythm of: Sinus rhythm   Problem List / ED Course / Critical interventions / Medication management  Patient presented for acute onset of lower abdominal discomfort earlier today.  This is noticed when she urinates.  She has had nausea without vomiting.  Vital signs on arrival are normal.  She is well-appearing on exam.  Her abdomen is soft.  She does endorse some mild suprapubic tenderness.  Workup was initiated. Urinalysis does show evidence of infection.  Ceftriaxone  was ordered.  Patient underwent CT imaging which did not show any acute findings.  She had improved symptoms while in the ED.  She was able to tolerate p.o. intake without difficulty.  She was discharged in stable condition. I ordered medication including ceftriaxone  for UTI, Toradol  for analgesia, Zofran  for nausea Reevaluation of the patient after these medicines showed that the patient improved I have reviewed the patients home medicines and have made adjustments as needed  Social Determinants of Health:  Has PCP     Final diagnoses:  Cystitis    ED Discharge Orders          Ordered    cephALEXin  (KEFLEX ) 500 MG capsule  4 times daily        05/29/24 0703    ondansetron  (ZOFRAN -ODT) 4 MG disintegrating tablet  Every 8 hours PRN        05/29/24 0703               Melvenia Motto, MD 05/29/24 [phone]

## 2024-06-04 ENCOUNTER — Emergency Department (HOSPITAL_COMMUNITY)
Admission: EM | Admit: 2024-06-04 | Discharge: 2024-06-04 | Disposition: A | Discharge status: Home / Self Care | Attending: Emergency Medicine | Admitting: Emergency Medicine

## 2024-06-04 ENCOUNTER — Other Ambulatory Visit: Payer: Self-pay

## 2024-06-04 ENCOUNTER — Encounter (HOSPITAL_COMMUNITY): Payer: Self-pay

## 2024-06-04 DIAGNOSIS — R300 Dysuria: Secondary | ICD-10-CM | POA: Insufficient documentation

## 2024-06-04 DIAGNOSIS — I10 Essential (primary) hypertension: Secondary | ICD-10-CM | POA: Insufficient documentation

## 2024-06-04 DIAGNOSIS — R110 Nausea: Secondary | ICD-10-CM | POA: Diagnosis not present

## 2024-06-04 DIAGNOSIS — Z79899 Other long term (current) drug therapy: Secondary | ICD-10-CM | POA: Insufficient documentation

## 2024-06-04 DIAGNOSIS — E119 Type 2 diabetes mellitus without complications: Secondary | ICD-10-CM | POA: Diagnosis not present

## 2024-06-04 DIAGNOSIS — Z87891 Personal history of nicotine dependence: Secondary | ICD-10-CM | POA: Insufficient documentation

## 2024-06-04 LAB — URINALYSIS, W/ REFLEX TO CULTURE (INFECTION SUSPECTED)
Bacteria, UA: NONE SEEN
Bilirubin Urine: NEGATIVE
Glucose, UA: NEGATIVE mg/dL
Hgb urine dipstick: NEGATIVE
Ketones, ur: NEGATIVE mg/dL
Leukocytes,Ua: NEGATIVE
Nitrite: NEGATIVE
Protein, ur: NEGATIVE mg/dL
Specific Gravity, Urine: 1.01 (ref 1.005–1.030)
pH: 5 (ref 5.0–8.0)

## 2024-06-04 LAB — COMPREHENSIVE METABOLIC PANEL WITH GFR
ALT: 18 U/L (ref 0–44)
AST: 25 U/L (ref 15–41)
Albumin: 4 g/dL (ref 3.5–5.0)
Alkaline Phosphatase: 89 U/L (ref 38–126)
Anion gap: 8 (ref 5–15)
BUN: 20 mg/dL (ref 8–23)
CO2: 24 mmol/L (ref 22–32)
Calcium: 8.8 mg/dL — ABNORMAL LOW (ref 8.9–10.3)
Chloride: 97 mmol/L — ABNORMAL LOW (ref 98–111)
Creatinine, Ser: 0.87 mg/dL (ref 0.44–1.00)
GFR, Estimated: >60 mL/min (ref >60)
Glucose, Bld: 147 mg/dL — ABNORMAL HIGH (ref 70–99)
Potassium: 3.5 mmol/L (ref 3.5–5.1)
Sodium: 129 mmol/L — ABNORMAL LOW (ref 135–145)
Total Bilirubin: 0.4 mg/dL (ref 0.0–1.2)
Total Protein: 8 g/dL (ref 6.5–8.1)

## 2024-06-04 LAB — TROPONIN I (HIGH SENSITIVITY): Troponin I (High Sensitivity): 5 ng/L (ref <18)

## 2024-06-04 LAB — CBC
HCT: 37.4 % (ref 36.0–46.0)
Hemoglobin: 12.2 g/dL (ref 12.0–15.0)
MCH: 29 pg (ref 26.0–34.0)
MCHC: 32.6 g/dL (ref 30.0–36.0)
MCV: 88.8 fL (ref 80.0–100.0)
Platelets: 161 K/uL (ref 150–400)
RBC: 4.21 MIL/uL (ref 3.87–5.11)
RDW: 14.1 % (ref 11.5–15.5)
WBC: 5.4 K/uL (ref 4.0–10.5)
nRBC: 0 % (ref 0.0–0.2)

## 2024-06-04 LAB — LIPASE, BLOOD: Lipase: 37 U/L (ref 11–51)

## 2024-06-04 MED ORDER — PANTOPRAZOLE SODIUM 40 MG IV SOLR
40.0000 mg | Freq: Once | INTRAVENOUS | Status: AC
  Administered 2024-06-04: 40 mg via INTRAVENOUS
  Filled 2024-06-04: qty 10

## 2024-06-04 MED ORDER — ALUM & MAG HYDROXIDE-SIMETH 200-200-20 MG/5ML PO SUSP
15.0000 mL | Freq: Once | ORAL | Status: AC
  Administered 2024-06-04: 15 mL via ORAL
  Filled 2024-06-04: qty 30

## 2024-06-04 NOTE — ED Triage Notes (Signed)
 Pt c/o painful urination that started today, states it feels like a lot of pressure when she goes. States she is also having a lot of nausea as well. Denies any vomiting or pain in her stomach.

## 2024-06-04 NOTE — Discharge Instructions (Signed)
 While you were in the emergency room, you had blood work done that showed that your sodium was a little bit low.  When you follow-up with your primary care doctor on Wednesday, please asked that they recheck your sodium levels.  We have sent a urine culture.  Will call you if your urine culture ends up being positive.  Please finish your antibiotics.

## 2024-06-04 NOTE — ED Provider Notes (Signed)
 [place] [facility] DE[facility]MENT AT [facility] Provider Note  CSN: [id number] Arrival date & time: 06/04/24 1827  Chief Complaint(s) Dysuria  HPI Christine [name] is a 88 y.o. female who is here today for intermittent dysuria, nausea.  Patient was treated for a UTI with Keflex  1 week ago.  Urinated Here in the ED and states that it was not painful.  Patient's daughter at bedside.   Past Medical History Past Medical History:  Diagnosis Date   Allergic rhinitis 08/10/2006   - chronic and stable  - Takes Claritin  10mg  daily as needed        Allergy    Arthritis    Cataract    Chronic pain of right knee 04/27/2018   Depression    Diabetes (HCC) 09/23/2014   GERD (gastroesophageal reflux disease)    per patient    Grief reaction 10/17/2018   Hip pain, acute, right 01/30/2018   Hyperlipemia    Hypertension    Impaired glucose tolerance    Morbidly obese (HCC)    Neck pain on left side 02/02/2023   Obesity, Class III, BMI 40-49.9 (morbid obesity) 11/03/2022   Osteoarthritis    Pruritus    Pseudogout    Recurrent major depressive disorder, in full remission (HCC) 11/03/2022   Seasonal allergies    Thyroid  nodule    THYROID  NODULE 08/10/2006   - lobectomy 08/2003 multinod goiter         Unintended weight loss 01/27/2021   Unintended weight loss 01/27/2021   Patient Active Problem List   Diagnosis Date Noted   Rotator cuff impingement syndrome of right shoulder 11/16/2023   Neck pain on left side 02/02/2023   Urinary incontinence 11/07/2017   Pseudogout 03/31/2017   Health care maintenance 10/09/2015   Type 2 diabetes mellitus (HCC) 09/23/2014   Constipation 08/24/2012   GERD 04/24/2009   Osteoarthritis 05/29/2007   Hyperlipidemia 08/10/2006   Essential hypertension 08/10/2006   Obesity, morbid (HCC) 08/10/2006   Home Medication(s) Prior to Admission medications   Medication Sig Start Date End Date Taking? Authorizing Provider  acetaminophen  (TYLENOL  8  HOUR ARTHRITIS PAIN) 650 MG CR tablet Take 1 tablet (650 mg total) by mouth every 8 (eight) hours as needed for pain. 11/03/22   Lovie Clarity, MD  amLODipine  (NORVASC ) 5 MG tablet Take 1 tablet (5 mg total) by mouth daily. 05/16/24 05/16/25  Shawn Sick, MD  atorvastatin  (LIPITOR) 40 MG tablet Take 1 tablet (40 mg total) by mouth daily. 07/21/23   Lovie Clarity, MD  cephALEXin  (KEFLEX ) 500 MG capsule Take 1 capsule (500 mg total) by mouth 4 (four) times daily. 05/29/24   Melvenia Motto, MD  colchicine  0.6 MG tablet Take 1 tablet (0.6 mg total) by mouth daily as needed (pseudogout flare). 03/28/24   Cheryl Waddell HERO, PA-C  diclofenac  Sodium (VOLTAREN ) 1 % GEL Apply 2-4 grams to affected area up to 4 times daily as needed. 05/16/24   Shawn Sick, MD  loratadine  (CLARITIN ) 10 MG tablet Take 1 pill daily for allergies 01/31/24   Lovie Clarity, MD  omeprazole  (PRILOSEC) 40 MG capsule Take 1 capsule (40 mg total) by mouth daily. 05/16/24   Shawn Sick, MD  ondansetron  (ZOFRAN -ODT) 4 MG disintegrating tablet Take 1 tablet (4 mg total) by mouth every 8 (eight) hours as needed for nausea or vomiting. 05/29/24   Melvenia Motto, MD  polyethylene glycol (MIRALAX ) 17 g packet Take 17 g by mouth daily. 05/16/24 06/15/24  Shawn Sick, MD  Past Surgical History Past Surgical History:  Procedure Laterality Date   ABDOMINAL HYSTERECTOMY     COLONOSCOPY     KNEE SURGERY     TUMOR REMOVAL     Family History Family History  Problem Relation Age of Onset   Colon cancer Mother        deceased age 6   Multiple myeloma Father    Lung cancer Sister    Kidney disease Sister    Kidney disease Sister    Asthma Brother    Esophageal cancer Neg Hx    Stomach cancer Neg Hx    Rectal cancer Neg Hx     Social History Social History   Tobacco Use   Smoking status: Former    Current packs/day: 0.00     Average packs/day: 0.3 packs/day for 35.0 years (8.8 ttl pk-yrs)    Types: Cigarettes    Start date: 12/14/1949    Quit date: 12/14/1984    Years since quitting: 39.4    Passive exposure: Never   Smokeless tobacco: Never  Vaping Use   Vaping status: Never Used  Substance Use Topics   Alcohol use: Not Currently   Drug use: No   Allergies Lisinopril , Influenza vaccines, and Fexofenadine-pseudoephed er  Review of Systems Review of Systems  Physical Exam Vital Signs  I have reviewed the triage vital signs BP 133/70   Pulse 82   Temp 98 F (36.7 C) (Oral)   Resp 17   LMP 01/12/1972   SpO2 100%   Physical Exam Vitals and nursing note reviewed.  Constitutional:      Appearance: Normal appearance.  Cardiovascular:     Rate and Rhythm: Normal rate.     Pulses: Normal pulses.  Pulmonary:     Effort: Pulmonary effort is normal.  Abdominal:     General: Abdomen is flat. There is no distension.     Palpations: Abdomen is soft.     Tenderness: There is no abdominal tenderness. There is no guarding.  Musculoskeletal:        General: No swelling or deformity. Normal range of motion.  Skin:    General: Skin is warm.  Neurological:     General: No focal deficit present.     Mental Status: She is alert.     ED Results and Treatments Labs (all labs ordered are listed, but only abnormal results are displayed) Labs Reviewed  COMPREHENSIVE METABOLIC PANEL WITH GFR - Abnormal; Notable for the following components:      Result Value   Sodium 129 (*)    Chloride 97 (*)    Glucose, Bld 147 (*)    Calcium  8.8 (*)    All other components within normal limits  URINALYSIS, W/ REFLEX TO CULTURE (INFECTION SUSPECTED) - Abnormal; Notable for the following components:   Color, Urine STRAW (*)    All other components within normal limits  URINE CULTURE  CBC  LIPASE, BLOOD  TROPONIN I (HIGH SENSITIVITY)  Radiology No results found.  Pertinent labs & imaging results that were available during my care of the patient were reviewed by me and considered in my medical decision making (see MDM for details).  Medications Ordered in ED Medications  pantoprazole  (PROTONIX ) injection 40 mg (40 mg Intravenous Given 06/04/24 2059)  alum & mag hydroxide-simeth (MAALOX/MYLANTA) 200-200-20 MG/5ML suspension 15 mL (15 mLs Oral Given 06/04/24 2059)                                                                                                                                     Procedures Procedures  (including critical care time)  Medical Decision Making / ED Course   This patient presents to the ED for concern of dysuria, nausea, this involves an extensive number of treatment options, and is a complaint that carries with it a high risk of complications and morbidity.  The differential diagnosis includes dysuria, persistent UTI, ACS, antibiotic use.  MDM: Patient's abdomen overall soft.  Believe her nausea is likely related to her recent antibiotic use, however we will check a troponin, EKG on the patient.  Patient had imaging done 1 week ago, do not believe she requires repeat imaging today given her reassuring exam, vital signs.  Basic blood work ordered.  Reviewed patient's urinalysis, no evidence of infection.  Will send for culture.  Patient has follow-up with her PCP on Wednesday.  Mild hyponatremia.  Reassessment 9:55 PM-troponin not elevated.  Let the patient follow-up with her PCP.   Additional history obtained: -Additional history obtained from daughter at bedside -External records from outside source obtained and reviewed including: Chart review including previous notes, labs, imaging, consultation notes   Lab Tests: -I ordered, reviewed, and interpreted labs.   The pertinent results include:   Labs Reviewed  COMPREHENSIVE METABOLIC PANEL WITH GFR -  Abnormal; Notable for the following components:      Result Value   Sodium 129 (*)    Chloride 97 (*)    Glucose, Bld 147 (*)    Calcium  8.8 (*)    All other components within normal limits  URINALYSIS, W/ REFLEX TO CULTURE (INFECTION SUSPECTED) - Abnormal; Notable for the following components:   Color, Urine STRAW (*)    All other components within normal limits  URINE CULTURE  CBC  LIPASE, BLOOD  TROPONIN I (HIGH SENSITIVITY)      EKG my independent review of the patient's EKG shows no ST segment depressions or elevations, no T wave inversions, no evidence of acute ischemia.  EKG Interpretation Date/Time:  Monday June 04 2024 21:09:49 EDT Ventricular Rate:  88 PR Interval:  190 QRS Duration:  88 QT Interval:  349 QTC Calculation: 423 R Axis:   42  Text Interpretation: Sinus rhythm Low voltage, precordial leads Confirmed by Mannie Pac [phone]) on 06/04/2024 9:27:53 PM        edicines ordered and prescription drug management: Meds ordered  this encounter  Medications   pantoprazole  (PROTONIX ) injection 40 mg   alum & mag hydroxide-simeth (MAALOX/MYLANTA) 200-200-20 MG/5ML suspension 15 mL    -I have reviewed the patients home medicines and have made adjustments as needed   Cardiac Monitoring: The patient was maintained on a cardiac monitor.  I personally viewed and interpreted the cardiac monitored which showed an underlying rhythm of: Normal sinus rhythm    Reevaluation: After the interventions noted above, I reevaluated the patient and found that they have :improved  Co morbidities that complicate the patient evaluation  Past Medical History:  Diagnosis Date   Allergic rhinitis 08/10/2006   - chronic and stable  - Takes Claritin  10mg  daily as needed        Allergy    Arthritis    Cataract    Chronic pain of right knee 04/27/2018   Depression    Diabetes (HCC) 09/23/2014   GERD (gastroesophageal reflux disease)    per patient    Grief reaction  10/17/2018   Hip pain, acute, right 01/30/2018   Hyperlipemia    Hypertension    Impaired glucose tolerance    Morbidly obese (HCC)    Neck pain on left side 02/02/2023   Obesity, Class III, BMI 40-49.9 (morbid obesity) 11/03/2022   Osteoarthritis    Pruritus    Pseudogout    Recurrent major depressive disorder, in full remission (HCC) 11/03/2022   Seasonal allergies    Thyroid  nodule    THYROID  NODULE 08/10/2006   - lobectomy 08/2003 multinod goiter         Unintended weight loss 01/27/2021   Unintended weight loss 01/27/2021      Dispostion: I considered admission for this patient, however she is appropriate for outpatient follow-up.     Final Clinical Impression(s) / ED Diagnoses Final diagnoses:  Dysuria     @PCDICTATION @    Mannie Pac T, DO 06/04/24 2158

## 2024-06-06 ENCOUNTER — Ambulatory Visit (INDEPENDENT_AMBULATORY_CARE_PROVIDER_SITE_OTHER): Payer: Self-pay | Admitting: Student

## 2024-06-06 VITALS — BP 131/66 | HR 87 | Temp 98.1°F | Ht 68.0 in | Wt 229.6 lb

## 2024-06-06 DIAGNOSIS — N390 Urinary tract infection, site not specified: Secondary | ICD-10-CM | POA: Diagnosis not present

## 2024-06-06 DIAGNOSIS — M1711 Unilateral primary osteoarthritis, right knee: Secondary | ICD-10-CM | POA: Diagnosis not present

## 2024-06-06 DIAGNOSIS — E871 Hypo-osmolality and hyponatremia: Secondary | ICD-10-CM | POA: Insufficient documentation

## 2024-06-06 DIAGNOSIS — M160 Bilateral primary osteoarthritis of hip: Secondary | ICD-10-CM

## 2024-06-06 LAB — URINE CULTURE: Culture: NO GROWTH

## 2024-06-06 MED ORDER — DICLOFENAC SODIUM 1 % EX GEL: CUTANEOUS | 4 refills | Status: AC

## 2024-06-06 NOTE — Assessment & Plan Note (Addendum)
 Patient has history of severe OA and surgery of the right knee for torn meniscus in ~2008. She has intermittent swelling and pain of the right knee about once a week. She is typically active and has been consistently walking daily and doing daily sedentary exercises for her legs. However, she finds that her mobility is limited when walking long distances. On physical exam, there is limitation to ROM of the right leg from pain, crepitance, and swelling of the medial aspect of the right knee. This is likely progression of OA. Patient has been followed by ortho in the past for knee pain, received injections, and discussed a knee replacement but is not interested at this time.  - follow up with orthopedic specialist to discuss steroid injections - PT referral - IR referral for geniculate artery embolization - continue diclofenac  gel

## 2024-06-06 NOTE — Progress Notes (Addendum)
 This is a Psychologist, occupational Note.  The care of the patient was discussed with Dr. Kandis and the assessment and plan was formulated with their assistance.  Please see their note for official documentation of the patient encounter.   Subjective:   Patient ID: Christine [name] female   DOB: August 09, 1936 88 y.o.   MRN: [id number]  HPI: Ms.Rakeya C Landrigan is a 88 y.o. GERD, T2DM, osteoarthritis, GERD. Pseudogout, HTN, HLD who presents today for an ED follow up and with concern of knee pain.  For the details of today's visit, please refer to the assessment and plan.     Past Medical History:  Diagnosis Date   Allergic rhinitis 08/10/2006   - chronic and stable  - Takes Claritin  10mg  daily as needed        Allergy    Arthritis    Cataract    Chronic pain of right knee 04/27/2018   Depression    Diabetes (HCC) 09/23/2014   GERD (gastroesophageal reflux disease)    per patient    Grief reaction 10/17/2018   Hip pain, acute, right 01/30/2018   Hyperlipemia    Hypertension    Impaired glucose tolerance    Morbidly obese (HCC)    Neck pain on left side 02/02/2023   Obesity, Class III, BMI 40-49.9 (morbid obesity) 11/03/2022   Osteoarthritis    Pruritus    Pseudogout    Recurrent major depressive disorder, in full remission (HCC) 11/03/2022   Seasonal allergies    Thyroid  nodule    THYROID  NODULE 08/10/2006   - lobectomy 08/2003 multinod goiter         Unintended weight loss 01/27/2021   Unintended weight loss 01/27/2021   Current Outpatient Medications  Medication Sig Dispense Refill   acetaminophen  (TYLENOL  8 HOUR ARTHRITIS PAIN) 650 MG CR tablet Take 1 tablet (650 mg total) by mouth every 8 (eight) hours as needed for pain. 90 tablet 3   amLODipine  (NORVASC ) 5 MG tablet Take 1 tablet (5 mg total) by mouth daily. 90 tablet 3   atorvastatin  (LIPITOR) 40 MG tablet Take 1 tablet (40 mg total) by mouth daily. 90 tablet 3   cephALEXin  (KEFLEX ) 500 MG capsule Take 1 capsule (500 mg  total) by mouth 4 (four) times daily. 20 capsule 0   colchicine  0.6 MG tablet Take 1 tablet (0.6 mg total) by mouth daily as needed (pseudogout flare). 90 tablet 0   diclofenac  Sodium (VOLTAREN ) 1 % GEL Apply 2-4 grams to affected area up to 4 times daily as needed. 120 g 4   loratadine  (CLARITIN ) 10 MG tablet Take 1 pill daily for allergies 90 tablet 1   omeprazole  (PRILOSEC) 40 MG capsule Take 1 capsule (40 mg total) by mouth daily. 90 capsule 3   ondansetron  (ZOFRAN -ODT) 4 MG disintegrating tablet Take 1 tablet (4 mg total) by mouth every 8 (eight) hours as needed for nausea or vomiting. 20 tablet 0   polyethylene glycol (MIRALAX ) 17 g packet Take 17 g by mouth daily. 30 packet 0   No current facility-administered medications for this visit.    Review of Systems: Pertinent items are noted in HPI. Objective:  Physical Exam: Vitals:   06/06/24 0818  BP: 131/66  Pulse: 87  Temp: 98.1 F (36.7 C)  TempSrc: Oral  SpO2: 100%  Weight: 229 lb 9.6 oz (104.1 kg)  Height: 5' 8 (1.727 m)    Constitutional: NAD, appears comfortable Cardiovascular: RRR, no murmurs, rubs, or gallops.  Pulmonary/Chest:  CTAB, no wheezes, rales, or rhonchi. .  Abdominal: No suprapubic, LLQ, or RLQ tenderness.  Extremities: Warm and well perfused.  Right knee: No erythema or warmth appreciated. Patient has limited ROM of RLE to 100 degrees extension due to pain. Crepitance and swelling of medial aspect of knee. Normal sensation of RLE. 4/5 strength on hip flexion and knee extension with limitation from knee pain. Left knee: No swelling or erythema. Normal ROM, strength, and sensation. Psychiatric: Normal mood and affect  Assessment & Plan:   UTI (urinary tract infection) Patient had a recent ED visit 8/19 with symptoms of nausea, abdominal pain, dysuria and was treated for a UTI with Keflex . She returned on 8/25 with similar symptoms and UC was drawn and is in process. She has completed this course of  antibiotics and feels that most of her symptoms have resolved, but she notes occasional abdominal pressure, incomplete voiding, and urine spillage following urgency. This seems to be a chronic issue for her and overall improving from her symptoms at the ED. She has finished her course of antibiotics given in the ED. Bladder scan done today not suggestive of urinary retention, with volume of 197 mL. Will continue to monitor for signs of infection and follow up in 3 months. She denies current fever, chills, nausea, vomiting, hematochezia, melena, hematuria, diarrhea. - will follow up with results of culture if there is growth - continue to monitor  Osteoarthritis Patient has history of severe OA and surgery of the right knee for torn meniscus in ~2008. She has intermittent swelling and pain of the right knee about once a week. She is typically active and has been consistently walking daily and doing daily sedentary exercises for her legs. However, she finds that her mobility is limited when walking long distances. On physical exam, there is limitation to ROM of the right leg from pain, crepitance, and swelling of the medial aspect of the right knee. This is likely progression of OA. Patient has been followed by ortho in the past for knee pain, received injections, and discussed a knee replacement but is not interested at this time.  - follow up with orthopedic specialist to discuss steroid injections - PT referral - IR referral for geniculate artery embolization - continue diclofenac  gel  Hyponatremia In ED visit on 08/25, CMP revealing of sodium 129. Likely 2/2 to decreased PO caused by nausea. Patient has improvement of these symptoms today.  - check BMP

## 2024-06-06 NOTE — Assessment & Plan Note (Signed)
 In ED visit on 08/25, CMP revealing of sodium 129. Likely 2/2 to decreased PO caused by nausea. Patient has improvement of these symptoms today.  - check BMP

## 2024-06-06 NOTE — Patient Instructions (Addendum)
 Thank you, [name] for allowing us  to provide your care today. Today we discussed your knee and urinary symptoms.    Referrals: -Physical therapy -Interventional Radiology for a geniculate artery embolization  I have ordered the following labs for you:   Lab Orders         Basic metabolic panel with GFR      I will call if any are abnormal. All of your labs can be accessed through My Chart.   My Chart Access: [email or web address]  Please follow-up in: 3 months    We look forward to seeing you next time. Please call our clinic at [phone] if you have any questions or concerns. The best time to call is Monday-Friday from 9am-4pm, but there is someone available 24/7. If after hours or the weekend, call the main hospital number and ask for the Internal Medicine Resident On-Call. If you need medication refills, please notify your pharmacy one week in advance and they will send us  a request.   Thank you for letting us  take part in your care. Wishing you the best!  Fayne Cambric, Medical Student 06/06/2024, 9:12 AM MS3

## 2024-06-06 NOTE — Assessment & Plan Note (Addendum)
 Patient had a recent ED visit 8/19 with symptoms of nausea, abdominal pain, dysuria and was treated for a UTI with Keflex . She returned on 8/25 with similar symptoms and UC was drawn and is in process. She has completed this course of antibiotics and feels that most of her symptoms have resolved, but she notes occasional abdominal pressure, incomplete voiding, and urine spillage following urgency. This seems to be a chronic issue for her and overall improving from her symptoms at the ED. She has finished her course of antibiotics given in the ED. Bladder scan done today not suggestive of urinary retention, with volume of 197 mL. Will continue to monitor for signs of infection and follow up in 3 months. She denies current fever, chills, nausea, vomiting, hematochezia, melena, hematuria, diarrhea. - will follow up with results of culture if there is growth - continue to monitor

## 2024-06-06 NOTE — Progress Notes (Deleted)
   Established Patient Office Visit  Subjective   Patient ID: Christine [name], female    DOB: 07-31-1936  Age: 88 y.o. MRN: [id number]  No chief complaint on file.   Christine [name] is a 88 y.o. who presents to the clinic for ***. Please see problem based assessment and plan for additional details.  8/19 nausea, dysuria, and abdominal pain with urination.   CT scan:No acute findings   Keflex  and zofran , no culture for urine then?   8/25 Went back: U/A w/o infection signs, culture ordered    BMP   Hyponatremia    Patient Active Problem List   Diagnosis Date Noted   Rotator cuff impingement syndrome of right shoulder 11/16/2023   Neck pain on left side 02/02/2023   Urinary incontinence 11/07/2017   Pseudogout 03/31/2017   Health care maintenance 10/09/2015   Type 2 diabetes mellitus (HCC) 09/23/2014   Constipation 08/24/2012   GERD 04/24/2009   Osteoarthritis 05/29/2007   Hyperlipidemia 08/10/2006   Essential hypertension 08/10/2006   Obesity, morbid (HCC) 08/10/2006      Objective:     LMP 01/12/1972  BP Readings from Last 3 Encounters:  06/04/24 133/70  05/29/24 128/70  05/16/24 124/67   Wt Readings from Last 3 Encounters:  05/28/24 231 lb 7.7 oz (105 kg)  05/16/24 229 lb 6.4 oz (104.1 kg)  03/28/24 227 lb (103 kg)      Physical Exam    Last metabolic panel Lab Results  Component Value Date   GLUCOSE 147 (H) 06/04/2024   NA 129 (L) 06/04/2024   K 3.5 06/04/2024   CL 97 (L) 06/04/2024   CO2 24 06/04/2024   BUN 20 06/04/2024   CREATININE 0.87 06/04/2024   GFRNONAA >60 06/04/2024   CALCIUM  8.8 (L) 06/04/2024   PROT 8.0 06/04/2024   ALBUMIN 4.0 06/04/2024   LABGLOB 3.6 12/11/2019   AGRATIO 1.2 12/11/2019   BILITOT 0.4 06/04/2024   ALKPHOS 89 06/04/2024   AST 25 06/04/2024   ALT 18 06/04/2024   ANIONGAP 8 06/04/2024      The ASCVD Risk score (Arnett DK, et al., 2019) failed to calculate for the following reasons:   The 2019 ASCVD  risk score is only valid for ages 26 to 64    Assessment & Plan:   Problem List Items Addressed This Visit   None   No follow-ups on file.    Damien Lease, DO

## 2024-06-07 ENCOUNTER — Ambulatory Visit: Payer: Self-pay | Admitting: Student

## 2024-06-07 LAB — BASIC METABOLIC PANEL WITH GFR
BUN/Creatinine Ratio: 21 (ref 12–28)
BUN: 17 mg/dL (ref 8–27)
CO2: 24 mmol/L (ref 20–29)
Calcium: 9.7 mg/dL (ref 8.7–10.3)
Chloride: 99 mmol/L (ref 96–106)
Creatinine, Ser: 0.81 mg/dL (ref 0.57–1.00)
Glucose: 110 mg/dL — ABNORMAL HIGH (ref 70–99)
Potassium: 4 mmol/L (ref 3.5–5.2)
Sodium: 139 mmol/L (ref 134–144)
eGFR: 71 mL/min/1.73 (ref >59)

## 2024-06-08 NOTE — Progress Notes (Signed)
 Internal Medicine Clinic Attending  Case discussed with the resident at the time of the visit.  We reviewed the resident's history and exam and pertinent patient test results.  I agree with the assessment, diagnosis, and plan of care documented in the resident's note.

## 2024-06-15 ENCOUNTER — Other Ambulatory Visit: Payer: Self-pay | Admitting: Internal Medicine

## 2024-06-15 DIAGNOSIS — M25561 Pain in right knee: Secondary | ICD-10-CM

## 2024-06-25 ENCOUNTER — Other Ambulatory Visit: Payer: Self-pay | Admitting: Interventional Radiology

## 2024-06-25 DIAGNOSIS — M25561 Pain in right knee: Secondary | ICD-10-CM

## 2024-07-02 DIAGNOSIS — H43821 Vitreomacular adhesion, right eye: Secondary | ICD-10-CM | POA: Diagnosis not present

## 2024-07-02 DIAGNOSIS — H43393 Other vitreous opacities, bilateral: Secondary | ICD-10-CM | POA: Diagnosis not present

## 2024-07-02 DIAGNOSIS — H25813 Combined forms of age-related cataract, bilateral: Secondary | ICD-10-CM | POA: Diagnosis not present

## 2024-07-02 DIAGNOSIS — H3562 Retinal hemorrhage, left eye: Secondary | ICD-10-CM | POA: Diagnosis not present

## 2024-07-02 DIAGNOSIS — H16223 Keratoconjunctivitis sicca, not specified as Sjogren's, bilateral: Secondary | ICD-10-CM | POA: Diagnosis not present

## 2024-07-11 ENCOUNTER — Other Ambulatory Visit: Payer: Self-pay

## 2024-07-11 DIAGNOSIS — K219 Gastro-esophageal reflux disease without esophagitis: Secondary | ICD-10-CM

## 2024-07-12 ENCOUNTER — Other Ambulatory Visit: Payer: Self-pay | Admitting: Physician Assistant

## 2024-07-12 ENCOUNTER — Other Ambulatory Visit: Payer: Self-pay

## 2024-07-12 NOTE — Telephone Encounter (Signed)
 Please schedule patient a follow up visit. Patient due December 2025. Thanks!   Follow-Up Instructions: Return in about 6 months (around 09/27/2024) for Pseudogout, Osteoarthritis.

## 2024-07-12 NOTE — Telephone Encounter (Signed)
LMOM for patient to call and schedule follow-up appointment.   °

## 2024-07-12 NOTE — Telephone Encounter (Signed)
 Last Fill: 03/28/2024  Labs: 06/06/2024 Glucose 110 06/04/2024 CBC WNL  Next Visit: Due December 2025. Message sent to the front to schedule.   Last Visit: 03/28/2024  DX: Pseudogout   Current Dose per office note 03/28/2024: colchicine  0.6 mg 1 tablet by mouth daily on a as needed basis during flares.   Okay to refill Colchicine ?

## 2024-07-13 ENCOUNTER — Other Ambulatory Visit: Payer: Self-pay

## 2024-07-13 DIAGNOSIS — K219 Gastro-esophageal reflux disease without esophagitis: Secondary | ICD-10-CM

## 2024-07-13 NOTE — Telephone Encounter (Signed)
 Copied from CRM [id number]. Topic: Clinical - Medication Refill >> Jul 13, 2024 10:48 AM Christine [name] ORN wrote: Medication: omeprazole  (PRILOSEC) 40 MG capsule  Has the patient contacted their pharmacy? Yes, they told patient that she would need to contact her provider.  (Agent: If no, request that the patient contact the pharmacy for the refill. If patient does not wish to contact the pharmacy document the reason why and proceed with request.) (Agent: If yes, when and what did the pharmacy advise?)  This is the patient's preferred pharmacy:  [id number] - [place], [place] - [address] AT [facility] [address] [place] [address] Phone: [phone] Fax: [phone]   Is this the correct pharmacy for this prescription? Yes If no, delete pharmacy and type the correct one.   Has the prescription been filled recently? Yes  Is the patient out of the medication? Yes  Has the patient been seen for an appointment in the last year OR does the patient have an upcoming appointment? Yes  Can we respond through MyChart? Yes  Agent: Please be advised that Rx refills may take up to 3 business days. We ask that you follow-up with your pharmacy.

## 2024-07-14 ENCOUNTER — Other Ambulatory Visit: Payer: Self-pay | Admitting: Physician Assistant

## 2024-07-23 ENCOUNTER — Other Ambulatory Visit

## 2024-07-27 ENCOUNTER — Other Ambulatory Visit: Payer: Self-pay
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
## 2024-07-30 ENCOUNTER — Inpatient Hospital Stay: Admission: RE | Admit: 2024-07-30 | Source: Ambulatory Visit

## 2024-07-30 ENCOUNTER — Telehealth: Payer: Self-pay | Admitting: *Deleted

## 2024-07-30 NOTE — Telephone Encounter (Signed)
 Copied from CRM #[id number]. Topic: Clinical - Prescription Issue >> Jul 30, 2024  9:45 AM DeAngela L wrote: Reason for CRM: patient calling about the status of omeprazole  (PRILOSEC) 40 MG capsule  medication refill she called in on 07/13/24 this is her heartburn med and she wanted to know what she had to do to get this medication refilled Patient is out of medication and states she needs this bad and has to take this 30 minutes before she eats, the patient is concerned cause she has been a patient for years and not sure why this happened   Pt num [phone] (H)  [id number] [address], [place] - [address] AT [facility] [address], [place] [address] Phone: [phone]  Fax: [phone]

## 2024-07-30 NOTE — Telephone Encounter (Signed)
 [facility] stated [facility]  has been ready and waiting for her to pick up.  I called pt to let her know - no answer; left message on vm.

## 2024-08-10 ENCOUNTER — Other Ambulatory Visit: Payer: Self-pay

## 2024-08-10 DIAGNOSIS — J309 Allergic rhinitis, unspecified: Secondary | ICD-10-CM

## 2024-08-10 NOTE — Telephone Encounter (Unsigned)
 Copied from CRM [id number]. Topic: Clinical - Medication Refill >> Aug 10, 2024  4:27 PM Debby BROCKS wrote: Medication: loratadine  (CLARITIN ) 10 MG tablet  Has the patient contacted their pharmacy? Yes (Agent: If no, request that the patient contact the pharmacy for the refill. If patient does not wish to contact the pharmacy document the reason why and proceed with request.) (Agent: If yes, when and what did the pharmacy advise?) No more refills, to contact PCP  This is the patient's preferred pharmacy:  [id number] - [place], [place] - [address] AT [facility] [address]  [place] [address] Phone: [phone] Fax: [phone]   Is this the correct pharmacy for this prescription? Yes If no, delete pharmacy and type the correct one.   Has the prescription been filled recently? No  Is the patient out of the medication? Yes  Has the patient been seen for an appointment in the last year OR does the patient have an upcoming appointment? Yes  Can we respond through MyChart? Yes  Agent: Please be advised that Rx refills may take up to 3 business days. We ask that you follow-up with your pharmacy.

## 2024-08-13 MED ORDER — LORATADINE 10 MG PO TABS: ORAL_TABLET | ORAL | 1 refills | Status: AC
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
## 2024-08-15 ENCOUNTER — Ambulatory Visit
Admission: RE | Admit: 2024-08-15 | Discharge: 2024-08-15 | Disposition: A | Discharge status: Home / Self Care | Source: Ambulatory Visit | Attending: Internal Medicine | Admitting: Internal Medicine

## 2024-08-15 ENCOUNTER — Ambulatory Visit
Admission: RE | Admit: 2024-08-15 | Discharge: 2024-08-15 | Disposition: A | Discharge status: Home / Self Care | Source: Ambulatory Visit | Attending: Interventional Radiology | Admitting: Interventional Radiology

## 2024-08-15 DIAGNOSIS — M25561 Pain in right knee: Secondary | ICD-10-CM

## 2024-08-15 DIAGNOSIS — M1711 Unilateral primary osteoarthritis, right knee: Secondary | ICD-10-CM | POA: Diagnosis not present

## 2024-08-15 HISTORY — PX: IR RADIOLOGIST EVAL & MGMT: IMG5224

## 2024-08-19 DIAGNOSIS — Z23 Encounter for immunization: Secondary | ICD-10-CM | POA: Diagnosis not present
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
## 2024-09-13 ENCOUNTER — Ambulatory Visit: Attending: Physician Assistant | Admitting: Physician Assistant

## 2024-09-13 ENCOUNTER — Encounter: Payer: Self-pay | Admitting: Physician Assistant

## 2024-09-13 VITALS — BP 137/82 | HR 76 | Temp 97.8°F | Resp 16 | Ht 68.25 in | Wt 231.2 lb

## 2024-09-13 DIAGNOSIS — L409 Psoriasis, unspecified: Secondary | ICD-10-CM | POA: Diagnosis not present

## 2024-09-13 DIAGNOSIS — Z8719 Personal history of other diseases of the digestive system: Secondary | ICD-10-CM | POA: Diagnosis not present

## 2024-09-13 DIAGNOSIS — M19042 Primary osteoarthritis, left hand: Secondary | ICD-10-CM | POA: Insufficient documentation

## 2024-09-13 DIAGNOSIS — M1120 Other chondrocalcinosis, unspecified site: Secondary | ICD-10-CM | POA: Insufficient documentation

## 2024-09-13 DIAGNOSIS — M160 Bilateral primary osteoarthritis of hip: Secondary | ICD-10-CM | POA: Diagnosis not present

## 2024-09-13 DIAGNOSIS — R7303 Prediabetes: Secondary | ICD-10-CM | POA: Diagnosis not present

## 2024-09-13 DIAGNOSIS — Z8639 Personal history of other endocrine, nutritional and metabolic disease: Secondary | ICD-10-CM | POA: Diagnosis not present

## 2024-09-13 DIAGNOSIS — G8929 Other chronic pain: Secondary | ICD-10-CM | POA: Insufficient documentation

## 2024-09-13 DIAGNOSIS — M1711 Unilateral primary osteoarthritis, right knee: Secondary | ICD-10-CM | POA: Diagnosis not present

## 2024-09-13 DIAGNOSIS — M25511 Pain in right shoulder: Secondary | ICD-10-CM | POA: Diagnosis not present

## 2024-09-13 DIAGNOSIS — M7061 Trochanteric bursitis, right hip: Secondary | ICD-10-CM | POA: Diagnosis not present

## 2024-09-13 DIAGNOSIS — Z8679 Personal history of other diseases of the circulatory system: Secondary | ICD-10-CM | POA: Diagnosis not present

## 2024-09-13 DIAGNOSIS — M19041 Primary osteoarthritis, right hand: Secondary | ICD-10-CM | POA: Insufficient documentation

## 2024-09-13 MED ORDER — COLCHICINE 0.6 MG PO TABS: 0.6000 mg | ORAL_TABLET | Freq: Every day | ORAL | 0 refills | Status: AC | PRN

## 2024-09-13 MED ORDER — LIDOCAINE HCL 1 % IJ SOLN
1.5000 mL | INTRAMUSCULAR | Status: AC | PRN
  Administered 2024-09-13: 1.5 mL

## 2024-09-13 MED ORDER — TRIAMCINOLONE ACETONIDE 40 MG/ML IJ SUSP
40.0000 mg | INTRAMUSCULAR | Status: AC | PRN
  Administered 2024-09-13: 40 mg via INTRA_ARTICULAR

## 2024-10-01 ENCOUNTER — Telehealth: Payer: Self-pay

## 2024-10-01 ENCOUNTER — Other Ambulatory Visit: Payer: Self-pay | Admitting: Interventional Radiology

## 2024-10-01 DIAGNOSIS — M1711 Unilateral primary osteoarthritis, right knee: Secondary | ICD-10-CM

## 2024-10-01 NOTE — Telephone Encounter (Signed)
 See telephone note

## 2024-10-01 NOTE — Discharge Instructions (Signed)

## 2024-10-08 ENCOUNTER — Inpatient Hospital Stay
Admission: RE | Admit: 2024-10-08 | Discharge: 2024-10-08 | Disposition: A | Discharge status: Home / Self Care | Source: Ambulatory Visit | Attending: Interventional Radiology | Admitting: Interventional Radiology

## 2024-10-12 ENCOUNTER — Telehealth: Payer: Self-pay

## 2024-10-12 NOTE — Telephone Encounter (Signed)
 See telephone note  Will ask Warren BROCKS., NP to call daughter as well
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
## 2024-10-22 ENCOUNTER — Telehealth: Payer: Self-pay

## 2024-10-22 MED ORDER — METHYLPREDNISOLONE 4 MG PO TBPK: ORAL_TABLET | ORAL | 0 refills | Status: AC

## 2024-10-22 NOTE — Telephone Encounter (Signed)
 See telephone note

## 2024-10-22 NOTE — Discharge Instructions (Signed)

## 2024-10-22 NOTE — Telephone Encounter (Signed)
 See telephone note  Please return call to verify pharmacy for post Medrol  Dose Pack
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
## 2024-10-24 ENCOUNTER — Other Ambulatory Visit: Payer: Self-pay | Admitting: Interventional Radiology

## 2024-10-24 ENCOUNTER — Ambulatory Visit
Admission: RE | Admit: 2024-10-24 | Discharge: 2024-10-24 | Disposition: A | Discharge status: Home / Self Care | Payer: Medicare (Managed Care) | Source: Ambulatory Visit | Attending: Interventional Radiology | Admitting: Interventional Radiology

## 2024-10-24 DIAGNOSIS — M1711 Unilateral primary osteoarthritis, right knee: Secondary | ICD-10-CM

## 2024-10-24 HISTORY — PX: IR EMBO ARTERIAL NOT HEMORR HEMANG INC GUIDE ROADMAPPING: IMG5448

## 2024-10-24 MED ORDER — FENTANYL CITRATE (PF) 50 MCG/ML IJ SOSY: 25.0000 ug | PREFILLED_SYRINGE | INTRAMUSCULAR | Status: DC | PRN

## 2024-10-24 MED ORDER — LIDOCAINE-EPINEPHRINE 1 %-1:100000 IJ SOLN
10.0000 mL | Freq: Once | INTRAMUSCULAR | Status: AC
  Administered 2024-10-24: 10 mL via INTRADERMAL

## 2024-10-24 MED ORDER — MIDAZOLAM HCL (PF) 2 MG/2ML IJ SOLN: 1.0000 mg | INTRAMUSCULAR | Status: DC | PRN

## 2024-10-24 MED ORDER — FENTANYL CITRATE (PF) 100 MCG/2ML IJ SOLN
INTRAMUSCULAR | Status: AC | PRN
  Administered 2024-10-24: 50 ug via INTRAVENOUS
  Administered 2024-10-24 (×2): 25 ug via INTRAVENOUS

## 2024-10-24 MED ORDER — MIDAZOLAM HCL (PF) 2 MG/2ML IJ SOLN
INTRAMUSCULAR | Status: AC | PRN
  Administered 2024-10-24 (×2): 1 mg via INTRAVENOUS

## 2024-10-24 MED ORDER — KETOROLAC TROMETHAMINE 30 MG/ML IJ SOLN
30.0000 mg | Freq: Once | INTRAMUSCULAR | Status: AC
  Administered 2024-10-24: 30 mg via INTRAVENOUS

## 2024-10-24 MED ORDER — NITROGLYCERIN 1 MG/10 ML FOR IR/CATH LAB
100.0000 ug | INTRA_ARTERIAL | Status: DC | PRN
  Administered 2024-10-24 (×2): 100 ug via INTRA_ARTERIAL

## 2024-10-24 MED ORDER — IIOPAMIDOL (ISOVUE-250) INJECTION 51%
100.0000 mL | Freq: Once | INTRAVENOUS | Status: AC | PRN
  Administered 2024-10-24: 70 mL via INTRA_ARTERIAL

## 2024-10-24 MED ORDER — SODIUM CHLORIDE 0.9 % IV SOLN: INTRAVENOUS | Status: DC

## 2024-10-24 MED ORDER — ACETAMINOPHEN 10 MG/ML IV SOLN
1000.0000 mg | Freq: Once | INTRAVENOUS | Status: AC
  Administered 2024-10-24: 1000 mg via INTRAVENOUS

## 2024-10-24 MED ORDER — DEXAMETHASONE SOD PHOSPHATE PF 10 MG/ML IJ SOLN
10.0000 mg | Freq: Once | INTRAMUSCULAR | Status: AC
  Administered 2024-10-24: 10 mg via INTRAVENOUS

## 2024-10-24 NOTE — Procedures (Signed)
 Interventional Radiology Procedure Note  Procedure:  Right geniculate artery embolization   Findings: Please refer to procedural dictation for full description. Right proximal SFA 4 Fr access, manual compression for hemostasis.  Complications: None immediate  Estimated Blood Loss: < 5 mL  Recommendations: IR will arrange 1 month outpatient follow up.  Ester Sides, MD

## 2024-10-26 ENCOUNTER — Telehealth: Payer: Self-pay

## 2024-10-26 NOTE — Telephone Encounter (Signed)
 See telephone note

## 2024-10-30 ENCOUNTER — Telehealth: Payer: Self-pay

## 2024-10-30 NOTE — Telephone Encounter (Signed)
 See telephone note

## 2024-11-09 ENCOUNTER — Telehealth: Payer: Self-pay

## 2024-11-09 ENCOUNTER — Telehealth: Payer: Self-pay | Admitting: *Deleted

## 2024-11-09 ENCOUNTER — Other Ambulatory Visit: Payer: Self-pay | Admitting: *Deleted

## 2024-11-09 ENCOUNTER — Other Ambulatory Visit: Payer: Self-pay

## 2024-11-09 DIAGNOSIS — E7849 Other hyperlipidemia: Secondary | ICD-10-CM

## 2024-11-09 MED ORDER — ATORVASTATIN CALCIUM 40 MG PO TABS: 40.0000 mg | ORAL_TABLET | Freq: Every day | ORAL | 3 refills | Status: AC

## 2024-11-09 NOTE — Telephone Encounter (Unsigned)
 Copied from CRM #[id number]. Topic: Clinical - Medication Question >> Nov 09, 2024 12:05 PM Diannia H wrote: Reason for CRM: Patient is wanting to know will see be okay if she goes a few days without taking this medicine atorvastatin  (LIPITOR) 40 MG tablet. I just sent in a rx for it but she is concerned because she hasn't had it today. Could you assist? Patients callback number is [phone].

## 2024-11-09 NOTE — Telephone Encounter (Unsigned)
 Copied from CRM [id number]. Topic: Clinical - Medication Refill >> Nov 09, 2024  2:25 PM Cherylann RAMAN wrote: Medication: atorvastatin  (LIPITOR) 40 MG tablet   Has the patient contacted their pharmacy? No (Agent: If no, request that the patient contact the pharmacy for the refill. If patient does not wish to contact the pharmacy document the reason why and proceed with request.) (Agent: If yes, when and what did the pharmacy advise?)  This is the patient's preferred pharmacy:  [id number] - [place], [place] - [address] AT [facility] [address] [place] [address] Phone: [phone] Fax: [phone]  Is this the correct pharmacy for this prescription? Yes If no, delete pharmacy and type the correct one.   Has the prescription been filled recently? No  Is the patient out of the medication? Yes  Has the patient been seen for an appointment in the last year OR does the patient have an upcoming appointment? Yes  Can we respond through MyChart? No  Agent: Please be advised that Rx refills may take up to 3 business days. We ask that you follow-up with your pharmacy.

## 2024-11-09 NOTE — Telephone Encounter (Signed)
 Copied from CRM [id number]. Topic: Clinical - Medication Refill >> Nov 09, 2024 12:03 PM Diannia H wrote: Medication: atorvastatin  (LIPITOR) 40 MG tablet  Has the patient contacted their pharmacy? Yes (Agent: If no, request that the patient contact the pharmacy for the refill. If patient does not wish to contact the pharmacy document the reason why and proceed with request.) (Agent: If yes, when and what did the pharmacy advise?)  This is the patient's preferred pharmacy:  [id number] - [place], [place] - [address] AT [facility] [address] [place] [address] Phone: [phone] Fax: [phone]  Is this the correct pharmacy for this prescription? Yes If no, delete pharmacy and type the correct one.   Has the prescription been filled recently? No  Is the patient out of the medication? Yes  Has the patient been seen for an appointment in the last year OR does the patient have an upcoming appointment? Yes  Can we respond through MyChart? Yes  Agent: Please be advised that Rx refills may take up to 3 business days. We ask that you follow-up with your pharmacy.

## 2024-11-13 NOTE — Telephone Encounter (Signed)
 Call to patient to see if she has gotten her Lipitor.  Told her missing a few days is ok, but to give the Clinics a call if she has still not received her Lipitor.

## 2025-03-15 ENCOUNTER — Ambulatory Visit: Admitting: Rheumatology

## 2025-03-20 ENCOUNTER — Ambulatory Visit
